# Patient Record
Sex: Female | Born: 1960 | Race: Black or African American | Hispanic: No | Marital: Single | Smoking: Current every day smoker
Health system: Southern US, Community
[De-identification: ages and names within clinical notes are randomized; demographics above are authoritative.]

## PROBLEM LIST (undated history)

## (undated) DIAGNOSIS — K7460 Unspecified cirrhosis of liver: Secondary | ICD-10-CM

## (undated) DIAGNOSIS — N183 Chronic kidney disease, stage 3 unspecified: Secondary | ICD-10-CM

## (undated) DIAGNOSIS — D649 Anemia, unspecified: Secondary | ICD-10-CM

## (undated) DIAGNOSIS — G4730 Sleep apnea, unspecified: Secondary | ICD-10-CM

## (undated) DIAGNOSIS — M1990 Unspecified osteoarthritis, unspecified site: Secondary | ICD-10-CM

## (undated) DIAGNOSIS — N179 Acute kidney failure, unspecified: Secondary | ICD-10-CM

## (undated) DIAGNOSIS — N289 Disorder of kidney and ureter, unspecified: Secondary | ICD-10-CM

## (undated) DIAGNOSIS — K769 Liver disease, unspecified: Secondary | ICD-10-CM

## (undated) DIAGNOSIS — U071 COVID-19: Secondary | ICD-10-CM

## (undated) DIAGNOSIS — J45909 Unspecified asthma, uncomplicated: Secondary | ICD-10-CM

## (undated) DIAGNOSIS — J449 Chronic obstructive pulmonary disease, unspecified: Secondary | ICD-10-CM

## (undated) DIAGNOSIS — I2510 Atherosclerotic heart disease of native coronary artery without angina pectoris: Secondary | ICD-10-CM

## (undated) DIAGNOSIS — B182 Chronic viral hepatitis C: Secondary | ICD-10-CM

## (undated) DIAGNOSIS — I10 Essential (primary) hypertension: Secondary | ICD-10-CM

## (undated) DIAGNOSIS — J189 Pneumonia, unspecified organism: Secondary | ICD-10-CM

## (undated) DIAGNOSIS — D696 Thrombocytopenia, unspecified: Secondary | ICD-10-CM

## (undated) DIAGNOSIS — F419 Anxiety disorder, unspecified: Secondary | ICD-10-CM

## (undated) DIAGNOSIS — C801 Malignant (primary) neoplasm, unspecified: Secondary | ICD-10-CM

## (undated) DIAGNOSIS — F0390 Unspecified dementia without behavioral disturbance: Secondary | ICD-10-CM

## (undated) DIAGNOSIS — K219 Gastro-esophageal reflux disease without esophagitis: Secondary | ICD-10-CM

## (undated) DIAGNOSIS — Z5189 Encounter for other specified aftercare: Secondary | ICD-10-CM

## (undated) DIAGNOSIS — G912 (Idiopathic) normal pressure hydrocephalus: Secondary | ICD-10-CM

## (undated) DIAGNOSIS — R87629 Unspecified abnormal cytological findings in specimens from vagina: Secondary | ICD-10-CM

## (undated) DIAGNOSIS — F32A Depression, unspecified: Secondary | ICD-10-CM

## (undated) DIAGNOSIS — E119 Type 2 diabetes mellitus without complications: Secondary | ICD-10-CM

## (undated) HISTORY — DX: Anemia, unspecified: D64.9

## (undated) HISTORY — DX: Thrombocytopenia, unspecified: D69.6

## (undated) HISTORY — DX: Unspecified asthma, uncomplicated: J45.909

## (undated) HISTORY — DX: Unspecified osteoarthritis, unspecified site: M19.90

## (undated) HISTORY — DX: Anxiety disorder, unspecified: F41.9

## (undated) HISTORY — PX: PORT-A-CATH REMOVAL: SHX5289

## (undated) HISTORY — DX: Chronic kidney disease, stage 3 unspecified: N18.30

## (undated) HISTORY — DX: Depression, unspecified: F32.A

## (undated) HISTORY — PX: SKIN GRAFT: SHX250

## (undated) HISTORY — DX: Acute kidney failure, unspecified: N17.9

## (undated) HISTORY — DX: COVID-19: U07.1

## (undated) HISTORY — DX: Pneumonia, unspecified organism: J18.9

## (undated) HISTORY — DX: Encounter for other specified aftercare: Z51.89

## (undated) HISTORY — DX: Unspecified dementia, unspecified severity, without behavioral disturbance, psychotic disturbance, mood disturbance, and anxiety: F03.90

## (undated) HISTORY — DX: Unspecified abnormal cytological findings in specimens from vagina: R87.629

---
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
## 2019-04-29 ENCOUNTER — Observation Stay (HOSPITAL_COMMUNITY): Payer: Medicaid Other

## 2019-04-29 ENCOUNTER — Encounter (HOSPITAL_COMMUNITY): Payer: Self-pay

## 2019-04-29 ENCOUNTER — Other Ambulatory Visit: Payer: Self-pay

## 2019-04-29 ENCOUNTER — Inpatient Hospital Stay (HOSPITAL_COMMUNITY)
Admission: EM | Admit: 2019-04-29 | Discharge: 2019-05-02 | DRG: 433 | Disposition: A | Discharge status: Home Health | Payer: Medicaid Other | Attending: Internal Medicine | Admitting: Internal Medicine

## 2019-04-29 ENCOUNTER — Emergency Department (HOSPITAL_COMMUNITY): Payer: Medicaid Other

## 2019-04-29 DIAGNOSIS — F0390 Unspecified dementia without behavioral disturbance: Secondary | ICD-10-CM | POA: Diagnosis present

## 2019-04-29 DIAGNOSIS — N179 Acute kidney failure, unspecified: Secondary | ICD-10-CM | POA: Diagnosis not present

## 2019-04-29 DIAGNOSIS — K7040 Alcoholic hepatic failure without coma: Secondary | ICD-10-CM | POA: Diagnosis present

## 2019-04-29 DIAGNOSIS — Z681 Body mass index (BMI) 19 or less, adult: Secondary | ICD-10-CM

## 2019-04-29 DIAGNOSIS — Z720 Tobacco use: Secondary | ICD-10-CM

## 2019-04-29 DIAGNOSIS — E876 Hypokalemia: Secondary | ICD-10-CM | POA: Diagnosis present

## 2019-04-29 DIAGNOSIS — E875 Hyperkalemia: Secondary | ICD-10-CM | POA: Diagnosis present

## 2019-04-29 DIAGNOSIS — K7031 Alcoholic cirrhosis of liver with ascites: Secondary | ICD-10-CM | POA: Diagnosis not present

## 2019-04-29 DIAGNOSIS — K7682 Hepatic encephalopathy: Secondary | ICD-10-CM | POA: Diagnosis present

## 2019-04-29 DIAGNOSIS — J449 Chronic obstructive pulmonary disease, unspecified: Secondary | ICD-10-CM | POA: Diagnosis present

## 2019-04-29 DIAGNOSIS — D61818 Other pancytopenia: Secondary | ICD-10-CM | POA: Diagnosis present

## 2019-04-29 DIAGNOSIS — R4182 Altered mental status, unspecified: Secondary | ICD-10-CM | POA: Diagnosis not present

## 2019-04-29 DIAGNOSIS — E860 Dehydration: Secondary | ICD-10-CM | POA: Diagnosis present

## 2019-04-29 DIAGNOSIS — N184 Chronic kidney disease, stage 4 (severe): Secondary | ICD-10-CM | POA: Diagnosis present

## 2019-04-29 DIAGNOSIS — Z85819 Personal history of malignant neoplasm of unspecified site of lip, oral cavity, and pharynx: Secondary | ICD-10-CM

## 2019-04-29 DIAGNOSIS — K7200 Acute and subacute hepatic failure without coma: Secondary | ICD-10-CM

## 2019-04-29 DIAGNOSIS — F1021 Alcohol dependence, in remission: Secondary | ICD-10-CM | POA: Diagnosis present

## 2019-04-29 DIAGNOSIS — K7460 Unspecified cirrhosis of liver: Secondary | ICD-10-CM

## 2019-04-29 DIAGNOSIS — Z20822 Contact with and (suspected) exposure to covid-19: Secondary | ICD-10-CM | POA: Diagnosis present

## 2019-04-29 DIAGNOSIS — E46 Unspecified protein-calorie malnutrition: Secondary | ICD-10-CM | POA: Diagnosis present

## 2019-04-29 DIAGNOSIS — N183 Chronic kidney disease, stage 3 unspecified: Secondary | ICD-10-CM | POA: Diagnosis present

## 2019-04-29 DIAGNOSIS — I129 Hypertensive chronic kidney disease with stage 1 through stage 4 chronic kidney disease, or unspecified chronic kidney disease: Secondary | ICD-10-CM | POA: Diagnosis present

## 2019-04-29 DIAGNOSIS — F17210 Nicotine dependence, cigarettes, uncomplicated: Secondary | ICD-10-CM | POA: Diagnosis present

## 2019-04-29 DIAGNOSIS — Z66 Do not resuscitate: Secondary | ICD-10-CM | POA: Diagnosis present

## 2019-04-29 DIAGNOSIS — D6959 Other secondary thrombocytopenia: Secondary | ICD-10-CM | POA: Diagnosis present

## 2019-04-29 DIAGNOSIS — D696 Thrombocytopenia, unspecified: Secondary | ICD-10-CM | POA: Diagnosis present

## 2019-04-29 DIAGNOSIS — B1920 Unspecified viral hepatitis C without hepatic coma: Secondary | ICD-10-CM | POA: Diagnosis present

## 2019-04-29 DIAGNOSIS — F329 Major depressive disorder, single episode, unspecified: Secondary | ICD-10-CM | POA: Diagnosis present

## 2019-04-29 HISTORY — DX: Chronic obstructive pulmonary disease, unspecified: J44.9

## 2019-04-29 HISTORY — DX: Liver disease, unspecified: K76.9

## 2019-04-29 HISTORY — DX: Essential (primary) hypertension: I10

## 2019-04-29 HISTORY — DX: Disorder of kidney and ureter, unspecified: N28.9

## 2019-04-29 LAB — COMPREHENSIVE METABOLIC PANEL
ALT: 22 U/L (ref 0–44)
AST: 39 U/L (ref 15–41)
Albumin: 3.2 g/dL — ABNORMAL LOW (ref 3.5–5.0)
Alkaline Phosphatase: 90 U/L (ref 38–126)
Anion gap: 7 (ref 5–15)
BUN: 41 mg/dL — ABNORMAL HIGH (ref 6–20)
CO2: 20 mmol/L — ABNORMAL LOW (ref 22–32)
Calcium: 9.3 mg/dL (ref 8.9–10.3)
Chloride: 108 mmol/L (ref 98–111)
Creatinine, Ser: 2.03 mg/dL — ABNORMAL HIGH (ref 0.44–1.00)
GFR calc Af Amer: 31 mL/min — ABNORMAL LOW (ref >60)
GFR calc non Af Amer: 27 mL/min — ABNORMAL LOW (ref >60)
Glucose, Bld: 82 mg/dL (ref 70–99)
Potassium: 5.3 mmol/L — ABNORMAL HIGH (ref 3.5–5.1)
Sodium: 135 mmol/L (ref 135–145)
Total Bilirubin: 1.5 mg/dL — ABNORMAL HIGH (ref 0.3–1.2)
Total Protein: 7.8 g/dL (ref 6.5–8.1)

## 2019-04-29 LAB — RESPIRATORY PANEL BY RT PCR (FLU A&B, COVID)
Influenza A by PCR: NEGATIVE
Influenza B by PCR: NEGATIVE
SARS Coronavirus 2 by RT PCR: NEGATIVE

## 2019-04-29 LAB — CBC
HCT: 33.9 % — ABNORMAL LOW (ref 36.0–46.0)
Hemoglobin: 11.3 g/dL — ABNORMAL LOW (ref 12.0–15.0)
MCH: 34.2 pg — ABNORMAL HIGH (ref 26.0–34.0)
MCHC: 33.3 g/dL (ref 30.0–36.0)
MCV: 102.7 fL — ABNORMAL HIGH (ref 80.0–100.0)
Platelets: 67 10*3/uL — ABNORMAL LOW (ref 150–400)
RBC: 3.3 MIL/uL — ABNORMAL LOW (ref 3.87–5.11)
RDW: 16.8 % — ABNORMAL HIGH (ref 11.5–15.5)
WBC: 2.6 10*3/uL — ABNORMAL LOW (ref 4.0–10.5)
nRBC: 0 % (ref 0.0–0.2)

## 2019-04-29 LAB — CBG MONITORING, ED
Glucose-Capillary: 76 mg/dL (ref 70–99)
Glucose-Capillary: 82 mg/dL (ref 70–99)
Glucose-Capillary: 80 mg/dL (ref 70–99)

## 2019-04-29 LAB — BLOOD GAS, VENOUS
Acid-base deficit: 3.5 mmol/L — ABNORMAL HIGH (ref 0.0–2.0)
Bicarbonate: 21.2 mmol/L (ref 20.0–28.0)
O2 Saturation: 52.4 %
Patient temperature: 98.6
pCO2, Ven: 39.7 mmHg — ABNORMAL LOW (ref 44.0–60.0)
pH, Ven: 7.348 (ref 7.250–7.430)
pO2, Ven: 34.9 mmHg (ref 32.0–45.0)

## 2019-04-29 LAB — PROTIME-INR
INR: 1.1 (ref 0.8–1.2)
Prothrombin Time: 14.1 seconds (ref 11.4–15.2)

## 2019-04-29 LAB — TROPONIN I (HIGH SENSITIVITY): Troponin I (High Sensitivity): 4 ng/L (ref <18)

## 2019-04-29 LAB — MAGNESIUM: Magnesium: 2.3 mg/dL (ref 1.7–2.4)

## 2019-04-29 LAB — LACTIC ACID, PLASMA: Lactic Acid, Venous: 1.3 mmol/L (ref 0.5–1.9)

## 2019-04-29 LAB — CK: Total CK: 43 U/L (ref 38–234)

## 2019-04-29 LAB — AMMONIA: Ammonia: 44 umol/L — ABNORMAL HIGH (ref 9–35)

## 2019-04-29 MED ORDER — SODIUM CHLORIDE 0.9% FLUSH: 3.0000 mL | Freq: Once | INTRAVENOUS | Status: DC

## 2019-04-29 MED ORDER — SODIUM CHLORIDE 0.9 % IV BOLUS
500.0000 mL | Freq: Once | INTRAVENOUS | Status: AC
  Administered 2019-04-29: 19:00:00 500 mL via INTRAVENOUS

## 2019-04-29 MED ORDER — NICOTINE 21 MG/24HR TD PT24
21.0000 mg | MEDICATED_PATCH | Freq: Every day | TRANSDERMAL | Status: DC
  Filled 2019-04-29 (×2): qty 1

## 2019-04-29 NOTE — ED Notes (Signed)
Phlebotomy unable to collect any blood.

## 2019-04-29 NOTE — ED Triage Notes (Signed)
Patient's sister reports tht the patient woke at 0630 today with decreased movement to the right arm and leg. patient also has altered mental status.

## 2019-04-29 NOTE — ED Provider Notes (Signed)
[facility] Emergency Department Provider Note MRN:  [id number]  Arrival date & time: 04/29/19     Chief Complaint   decreased mobility and Altered Mental Status   History of Present Illness   Judy Meza is a 59 y.o. year-old female with a history of COPD, throat cancer, liver disease, CKD presenting to the ED with chief complaint of altered mental status.  Patient was acting normally yesterday at about 8 PM prior to going to bed.  Patient was discovered by his sister today in the bathroom acting abnormally.  Attempting to wipe herself without any toilet paper, confused.  Shuffled gait, question of weakness to the right side.  I was unable to obtain an accurate HPI, PMH, or ROS due to the patient's altered mental status.  Level 5 caveat.  Review of Systems  A complete 10 system review of systems was obtained and all systems are negative except as noted in the HPI and PMH.   Patient's Health History    Past Medical History:  Diagnosis Date  . Cancer ([place])   . COPD (chronic obstructive pulmonary disease) ([place])   . Hypertension   . Liver disease   . Renal disorder     Past Surgical History:  Procedure Laterality Date  . PORT-A-CATH REMOVAL      Family History  Problem Relation Age of Onset  . Healthy Mother   . Hypertension Other     Social History   Socioeconomic History  . Marital status: Single    Spouse name: Not on file  . Number of children: Not on file  . Years of education: Not on file  . Highest education level: Not on file  Occupational History  . Not on file  Tobacco Use  . Smoking status: Current Every Day Smoker    Packs/day: 0.25    Types: Cigarettes  . Smokeless tobacco: Never Used  Substance and Sexual Activity  . Alcohol use: Not Currently  . Drug use: Not Currently  . Sexual activity: Not on file  Other Topics Concern  . Not on file  Social History Narrative  . Not on file   Social Determinants of Health    Financial Resource Strain:   . Difficulty of Paying Living Expenses:   Food Insecurity:   . Worried About Charity fundraiser in the Last Year:   . Arboriculturist in the Last Year:   Transportation Needs:   . Film/video editor (Medical):   Marland Kitchen Lack of Transportation (Non-Medical):   Physical Activity:   . Days of Exercise per Week:   . Minutes of Exercise per Session:   Stress:   . Feeling of Stress :   Social Connections:   . Frequency of Communication with Friends and Family:   . Frequency of Social Gatherings with Friends and Family:   . Attends Religious Services:   . Active Member of Clubs or Organizations:   . Attends Archivist Meetings:   Marland Kitchen Marital Status:   Intimate Partner Violence:   . Fear of Current or Ex-Partner:   . Emotionally Abused:   Marland Kitchen Physically Abused:   . Sexually Abused:      Physical Exam   Vitals:   04/29/19 1356 04/29/19 1900  BP: 104/76 (!) 139/99  Pulse: 94 89  Resp: 16 (!) 22  Temp: 98.2 F (36.8 C)   SpO2: 99% 99%    CONSTITUTIONAL: Chronically ill-appearing, NAD NEURO: Alert, answers most questions with her  name, confused, moves all extremities equally EYES:  eyes equal and reactive ENT/NECK:  no LAD, no JVD; stoma to the anterior neck with dressing in place CARDIO: Regular rate, well-perfused, normal S1 and S2 PULM:  CTAB no wheezing or rhonchi GI/GU:  normal bowel sounds, non-distended, non-tender MSK/SPINE:  No gross deformities, no edema SKIN:  no rash, atraumatic PSYCH:  Appropriate speech and behavior  *Additional and/or pertinent findings included in MDM below  Diagnostic and Interventional Summary    EKG Interpretation  Date/Time:    Ventricular Rate:    PR Interval:    QRS Duration:   QT Interval:    QTC Calculation:   R Axis:     Text Interpretation:        Labs Reviewed  COMPREHENSIVE METABOLIC PANEL - Abnormal; Notable for the following components:      Result Value   Potassium 5.3 (*)     CO2 20 (*)    BUN 41 (*)    Creatinine, Ser 2.03 (*)    Albumin 3.2 (*)    Total Bilirubin 1.5 (*)    GFR calc non Af Amer 27 (*)    GFR calc Af Amer 31 (*)    All other components within normal limits  CBC - Abnormal; Notable for the following components:   WBC 2.6 (*)    RBC 3.30 (*)    Hemoglobin 11.3 (*)    HCT 33.9 (*)    MCV 102.7 (*)    MCH 34.2 (*)    RDW 16.8 (*)    Platelets 67 (*)    All other components within normal limits  AMMONIA - Abnormal; Notable for the following components:   Ammonia 44 (*)    All other components within normal limits  SARS CORONAVIRUS 2 (TAT 6-24 HRS)  PROTIME-INR  LACTIC ACID, PLASMA  URINALYSIS, ROUTINE W REFLEX MICROSCOPIC  CBC WITH DIFFERENTIAL/PLATELET  CK  MAGNESIUM  SODIUM, URINE, RANDOM  CREATININE, URINE, RANDOM  BLOOD GAS, VENOUS  BRAIN NATRIURETIC PEPTIDE  CBG MONITORING, ED  CBG MONITORING, ED  TROPONIN I (HIGH SENSITIVITY)    CT Head Wo Contrast  Final Result    DG Chest Port 1 View  Final Result      Medications  sodium chloride flush (NS) 0.9 % injection 3 mL (has no administration in time range)  sodium chloride 0.9 % bolus 500 mL (500 mLs Intravenous New Bag/Given 04/29/19 1925)     Procedures  /  Critical Care Ultrasound ED Peripheral IV (Provider)  Date/Time: 04/29/2019 4:54 PM Performed by: Maudie Flakes, MD Authorized by: Maudie Flakes, MD   Procedure details:    Indications: multiple failed IV attempts     Skin Prep: chlorhexidine gluconate     Location: Right basilic vein.   Angiocath:  20 G   Bedside Ultrasound Guided: Yes     Patient tolerated procedure without complications: Yes     Dressing applied: Yes    .Critical Care Performed by: Maudie Flakes, MD Authorized by: Maudie Flakes, MD   Critical care provider statement:    Critical care time (minutes):  32   Critical care was necessary to treat or prevent imminent or life-threatening deterioration of the following  conditions:  CNS failure or compromise (Encephalopathy)   Critical care was time spent personally by me on the following activities:  Discussions with consultants, evaluation of patient's response to treatment, examination of patient, ordering and performing treatments and interventions, ordering and review of  laboratory studies, ordering and review of radiographic studies, pulse oximetry, re-evaluation of patient's condition, obtaining history from patient or surrogate and review of old charts    ED Course and Medical Decision Making  I have reviewed the triage vital signs, the nursing notes, and pertinent available records from the EMR.  Listed above are laboratory and imaging tests that I personally ordered, reviewed, and interpreted and then considered in my medical decision making (see below for details).      Unclear if CNS or metabolic cause of altered mental status.  Sister is reporting this possible right-sided weakness though it is not evident on exam currently.  Patient is acutely confused, can state her name but then answers any other questions with her name.  There does not appear to be visual field cuts or neglect or aphasia, last known normal yesterday at 8 PM.  Will evaluate for metabolic causes such as hepatic encephalopathy, metabolic disarray, UTI, will also obtain CT head and monitor closely.  Work-up is largely unrevealing, mildly elevated ammonia, mild hypokalemia.  CT is without acute process.  Admitted to hospitalist service for further care.  Barth Kirks. Sedonia Small, [place] [email or web address]  Final Clinical Impressions(s) / ED Diagnoses     ICD-10-CM   1. Altered mental status, unspecified altered mental status type  R41.82     ED Discharge Orders    None       Discharge Instructions Discussed with and Provided to Patient:   Discharge Instructions   None       Maudie Flakes, MD 04/29/19 2025

## 2019-04-29 NOTE — ED Notes (Signed)
Phlebotomy consulted.

## 2019-04-29 NOTE — ED Notes (Signed)
Unable to collect blood, IV team consulted. Phlebotomy called, no answer.

## 2019-04-29 NOTE — H&P (Signed)
Judy Meza JOI:[id number] DOB: 29-Apr-1960 DOA: 04/29/2019   PCP: Patient, No Pcp Per   Outpatient Specialists:  NONE    Patient arrived to ER on 04/29/19 at 1345  Patient coming from: home Lives  With family    Chief Complaint:   Chief Complaint  Patient presents with  . decreased mobility  . Altered Mental Status    HPI: Judy Meza is a 59 y.o. female with medical history significant of COPD, throat cancer, cirrhosis, CKD, HTN, EtOH abuse in remission, tobacco abuse and hx of IVDU in remission, HEpC    Presented with   patient woke up today at 6:30 AM with decreased movement of her right arm and leg and confused, last time seen normal 8 PM.  She was discovered this morning by her sister in the bathroom.  Patient appeared very confused and family had to feed her.  Patient was having shuffling gait. Pt is originally from [place] and have moved 1 wks ago, prior to that pt was homeless until July when her family took er in ad started to take care of her. Patient continues to smoke Does not drink alcohol She supposed to be on lactulose Family have been giving her lactulose as needed bc she had too many BM's Per family patient recently was admitted dementia, for hepatic encephalopathy after that she was moved by her family to [place] they have not established any care here.  She used to have a PEG tube and tracheostomy but recently has been doing decannulated She is currently able to take her medication by mouth  Infectious risk factors:  Reports confusion   Has  been vaccinated against COVID x1 Feb 17th   in house  PCR testing  Pending  No results found for: SARSCOV2NAA  Regarding pertinent Chronic problems:   Hx of throat cancer in May 2020- sp radiation therapy only sp trach and lately been decanulated    HTN on lisinopril 10 qd       COPD - not  followed by pulmonology  Not on baseline oxygen          CKD stage III - baseline Cr unKnown Lab Results    Component Value Date   CREATININE 2.03 (H) 04/29/2019    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 ED FAST [place] BEDSIDE  . SARS CORONAVIRUS 2 (TAT 6-24 HRS) Nasopharyngeal Nasopharyngeal Swab  . CT Head Wo Contrast  . DG Chest [facility]  . Comprehensive metabolic panel  . CBC  . Ammonia  . Protime-INR  . Lactic acid, plasma  . Urinalysis, Routine w reflex microscopic  . Diet NPO time specified  . Cardiac monitoring  . Saline Lock IV, Maintain IV access  . Neuro checks q 2 hours x12 hours  . Consult to hospitalist  ALL PATIENTS BEING ADMITTED/HAVING PROCEDURES NEED COVID-19  SCREENING  . Pulse oximetry, continuous  . CBG monitoring, ED  . I-Stat beta hCG blood, ED ([facility], [facility], [facility] only)    Following Medications were ordered in ER: Medications  sodium chloride flush (NS) 0.9 % injection 3 mL (has no administration in time range)  sodium chloride 0.9 % bolus 500 mL (has no administration in time range)        Consult Orders  (From admission, onward)         Start     Ordered   04/29/19 1837  Consult to hospitalist  ALL PATIENTS BEING ADMITTED/HAVING PROCEDURES NEED COVID-19 SCREENING  Once    Comments: ALL PATIENTS BEING ADMITTED/HAVING PROCEDURES NEED COVID-19 SCREENING  Provider:  (Not yet assigned)  Question Answer Comment  Place call to: [facility]   Reason for Consult Admit       04/29/19 1837         Significant initial  Findings: Abnormal Labs Reviewed  COMPREHENSIVE METABOLIC PANEL - Abnormal; Notable for the following components:      Result Value   Potassium 5.3 (*)    CO2 20 (*)    BUN 41 (*)    Creatinine, Ser 2.03 (*)    Albumin 3.2 (*)    Total Bilirubin 1.5 (*)    GFR calc non Af Amer 27 (*)    GFR calc Af Amer 31 (*)    All other components within normal limits  CBC - Abnormal; Notable for the following components:   WBC 2.6 (*)    RBC 3.30 (*)    Hemoglobin 11.3 (*)    HCT 33.9 (*)    MCV 102.7 (*)    MCH 34.2 (*)    RDW 16.8 (*)    Platelets 67 (*)    All other components within normal limits  AMMONIA - Abnormal; Notable for the following components:   Ammonia 44 (*)    All other components within normal limits    Otherwise labs showing:    Recent Labs  Lab 04/29/19 1409  NA 135  K 5.3*  CO2 20*  GLUCOSE 82  BUN 41*  CREATININE 2.03*  CALCIUM 9.3    Cr   Lab Results  Component Value Date   CREATININE 2.03 (H) 04/29/2019    Recent Labs  Lab 04/29/19 1409  AST 39  ALT 22  ALKPHOS 90  BILITOT 1.5*  PROT 7.8  ALBUMIN 3.2*   Lab Results  Component Value Date   CALCIUM 9.3 04/29/2019     WBC      Component Value Date/Time   WBC 2.6 (L) 04/29/2019 1409   ANC No results found for: NEUTROABS ALC No components found for: LYMPHAB    Plt: Lab Results  Component Value Date   PLT 67 (L) 04/29/2019    Lactic Acid, Venous    Component Value Date/Time   LATICACIDVEN 1.3 04/29/2019 1426     COVID-19 Labs  No results for input(s): DDIMER, FERRITIN, LDH, CRP in the last 72 hours.  No results found for: SARSCOV2NAA    Venous  Blood Gas result:  pH 7.348 pCO2 39.7;    HG/HCT stable     Component Value Date/Time   HGB 11.3 (L) 04/29/2019 1409   HCT 33.9 (L) 04/29/2019 1409    No results for input(s): LIPASE, AMYLASE in the last 168 hours. Recent Labs  Lab 04/29/19 1424  AMMONIA 44*    No  components found for: LABALBU   Troponin  4  Cardiac Panel (last 3 results) Recent Labs    04/29/19 2008  CKTOTAL 43       ECG: Ordered     CBG (last 3)  Recent Labs    04/29/19 1422 04/29/19 2018  GLUCAP 76 82     UA  ordered      Ordered  CT HEAD  NON acute  CXR  NON acute       ED Triage Vitals  Enc Vitals Group     BP 04/29/19 1356 104/76     Pulse Rate 04/29/19 1356 94     Resp 04/29/19 1356 16     Temp 04/29/19 1356 98.2 F (36.8 C)     Temp Source 04/29/19 1356 Oral     SpO2 04/29/19 1356 99 %     Weight 04/29/19 1403 105 lb (47.6 kg)     Height 04/29/19 1403 5\' 2"  (1.575 m)     Head Circumference --      Peak Flow --      Pain Score 04/29/19 1403 0     Pain Loc --      Pain Edu? --      Excl. in [place]? --   TMAX(24)@       Latest  Blood pressure 104/76, pulse 94, temperature 98.2 F (36.8 C), temperature source Oral, resp. rate 16, height 5\' 2"  (1.575 m), weight 47.6 kg, SpO2 99 %.     Review of Systems:    Pertinent positives include: fatigue  confusion  Constitutional:  No weight loss, night sweats, Fevers, chills, , weight loss  HEENT:  No headaches, Difficulty swallowing,Tooth/dental problems,Sore throat,  No sneezing, itching, ear ache, nasal congestion, post nasal drip,  Cardio-vascular:  No chest pain, Orthopnea, PND, anasarca, dizziness, palpitations.no Bilateral lower extremity swelling  GI:  No heartburn, indigestion, abdominal pain, nausea, vomiting, diarrhea, change in bowel habits, loss of appetite, melena, blood in stool, hematemesis Resp:  no shortness of breath at rest. No dyspnea on exertion, No excess mucus, no productive cough, No non-productive cough, No coughing up of blood.No change in color of mucus.No wheezing. Skin:  no rash or lesions. No jaundice GU:  no dysuria, change in color of urine, no urgency or frequency. No straining to urinate.  No flank pain.  Musculoskeletal:  No joint pain or no joint swelling. No  decreased range of motion. No back pain.  Psych:  No change in mood or affect. No depression or anxiety. No memory loss.  Neuro: no localizing neurological complaints, no tingling, no weakness, no double vision, no gait abnormality, no slurred speech, no  All systems reviewed and apart from [place] all are negative  Past Medical History:   Past Medical History:  Diagnosis Date  . Cancer ([place])   . COPD (chronic obstructive pulmonary disease) ([place])   . Hypertension   . Liver disease   . Renal disorder      Past Surgical History:  Procedure Laterality Date  . PORT-A-CATH REMOVAL      Social History:  Ambulatory   independently       reports that she has been smoking cigarettes. She has been smoking about 0.25 packs per day. She has never used smokeless tobacco. She reports previous alcohol use. She reports previous drug use.     Family History:   Family History  Problem Relation Age of Onset  . Healthy Mother     Allergies: No Known Allergies   Prior to Admission medications   Not  on File   Physical Exam: Blood pressure 104/76, pulse 94, temperature 98.2 F (36.8 C), temperature source Oral, resp. rate 16, height 5\' 2"  (1.575 m), weight 47.6 kg, SpO2 99 %. 1. General:  in No  Acute distress   Chronically ill -appearing 2. Psychological: Alert and   Oriented 3. Head/ENT:   Dry Mucous Membranes                          Head Non traumatic, neck supple                         Poor Dentition 4. SKIN:   decreased Skin turgor,  Skin clean Dry and intact no rash 5. Heart: Regular rate and rhythm no  Murmur, no Rub or gallop 6. Lungs: no wheezes or crackles   7. Abdomen: Soft,  non-tender, mildly distended   bowel sounds present 8. Lower extremities: no clubbing, cyanosis, no edema 9. Neurologically   strength 5 out of 5 in all 4 extremities cranial nerves II through XII intact mild asterexis 10. MSK: Normal range of motion   All other LABS:     Recent Labs  Lab  04/29/19 1409  WBC 2.6*  HGB 11.3*  HCT 33.9*  MCV 102.7*  PLT 67*     Recent Labs  Lab 04/29/19 1409  NA 135  K 5.3*  CL 108  CO2 20*  GLUCOSE 82  BUN 41*  CREATININE 2.03*  CALCIUM 9.3     Recent Labs  Lab 04/29/19 1409  AST 39  ALT 22  ALKPHOS 90  BILITOT 1.5*  PROT 7.8  ALBUMIN 3.2*       Cultures: No results found for: SDES, SPECREQUEST, CULT, REPTSTATUS   Radiological Exams on Admission: CT Head Wo Contrast  Result Date: 04/29/2019 CLINICAL DATA:  Altered mental status. Decreased movement of RIGHT arm and leg. EXAM: CT HEAD WITHOUT CONTRAST TECHNIQUE: Contiguous axial images were obtained from the base of the skull through the vertex without intravenous contrast. COMPARISON:  None. FINDINGS: Brain: Ventricles are normal in size. Patchy chronic small vessel ischemic changes are seen within the bilateral periventricular and subcortical white matter. There is a small old lacunar infarct within the LEFT basal ganglia. No mass, hemorrhage or other evidence of acute parenchymal abnormality. No extra-axial hemorrhage. Vascular: Chronic calcified atherosclerotic changes of the large vessels at the skull base. No unexpected hyperdense vessel. Skull: Normal. Negative for fracture or focal lesion. Sinuses/Orbits: No acute finding. Other: None. IMPRESSION: 1. No acute findings.  No intracranial mass, hemorrhage or edema. 2. Chronic small vessel ischemic changes within the white matter and basal ganglia. Electronically Signed   By: Franki Cabot M.D.   On: 04/29/2019 15:05   DG Chest Port 1 View  Result Date: 04/29/2019 CLINICAL DATA:  Altered mental status with decreased movement in the right arm and leg. EXAM: PORTABLE CHEST 1 VIEW COMPARISON:  None. FINDINGS: The heart size and mediastinal contours are within normal limits. Both lungs are clear. The visualized skeletal structures are unremarkable. IMPRESSION: Normal examination. Electronically Signed   By: Claudie Revering M.D.    On: 04/29/2019 14:51    Chart has been reviewed    Assessment/Plan  59 y.o. female with medical history significant of COPD, throat cancer, cirrhosis, CKD, HTN, EtOH abuse in remission, tobacco abuse and hx of IVDU in remission, HEpC  Admitted for hepatic encephlopathy  Present on Admission: .  Acute hepatic encephalopathy -restart lactulose patient's family  has decreased it recently.  Continue Xeloda check ammonia level in the morning continue to monitor. At this point no evidence of right-sided weakness CT head unremarkable ammonia level slightly elevated Would benefit from further CNS imaging given history of cancer.  Would benefit from contrasted MRI when renal function improves. For tonight hold sedating medications VBG showing no evidence of hypercarbia   . CKD (chronic kidney disease), stage III  -patient family showed me records that at baseline her creatinine is close to 1.2 likely currently AKI.  We will gently rehydrate avoid nephrotoxic medications  . COPD with chronic bronchitis (HCC) -chronic stable at baseline not on oxygen continue home medications  . Tobacco abuse -   Spoke about importance of quitting spent 5 minutes discussing options for treatment, prior attempts at quitting, and dangers of smoking  -At this point patient is    interested in quitting  - order nicotine patch   - nursing tobacco cessation protocol  . AKI (acute kidney injury) ([place]) most likely secondary to diminished p.o. intake we will gently rehydrate hold spironolactone Lasix and lisinopril for tonight if evidence of significant hypoalbuminemia once fluid resuscitated may need to replace.  Obtain ultrasound to evaluate for any evidence of underlying kidney disease  . Alcoholic cirrhosis of liver with ascites (HCC) -ascites mild.  Patient has not required paracentesis for the past 6 weeks.  Patient will need to have follow-up with GI established. MELD-Na score: 18 at 04/29/2019    .  Thrombocytopenia ([place]) most likely secondary to cirrhosis  . Hyperkalemia mild will monitor on telemetry hold ACE inhibitor  History of hepatitis C -untreated will need to follow-up with GI and establish care Obtain hep C viral load and check HIV status  History of throat cancer patient will need to establish care with hematology locally  History of alcohol abuse currently in remission   Dehydration we will gently rehydrate and follow   Other plan as per orders.  DVT prophylaxis:  SCD     Code Status:    DNR/DNI as per patient   I had personally discussed CODE STATUS with   Family     Family Communication:   Family at  Bedside  plan of care was discussed on the phone with  Sister   Disposition Plan:     To home once workup is complete and patient is stable   Following barriers for discharge:                            Electrolytes corrected                                                           Will need to be able to tolerate PO                            Will likely need home health                                                  Would benefit  from PT/OT eval prior to DC  Ordered                   Swallow eval - SLP ordered                      Transition of care consulted                   Nutrition    consulted                                                        Consults called:   none no indication for emergent consult at this time by the time of discharge will need assistance with setting up care with GI and hematology oncology  Admission status:  ED Disposition    None      Obs    Level of care    tele  For   24H     Precautions: admitted as PUI  No active isolations  If Covid PCR is negative  - please DC precautions     PPE: Used by the provider:   P100  eye Goggles,  Gloves  gown      Aloise Copus 04/29/2019, 10:27 PM    [facility]     after 2 AM please page floor coverage PA If 7AM-7PM, please contact the day team  taking care of the patient using [email or web address]   Patient was evaluated in the context of the global COVID-19 pandemic, which necessitated consideration that the patient might be at risk for infection with the SARS-CoV-2 virus that causes COVID-19. Institutional protocols and algorithms that pertain to the evaluation of patients at risk for COVID-19 are in a state of rapid change based on information released by regulatory bodies including the CDC and federal and state organizations. These policies and algorithms were followed during the patient's care.

## 2019-04-30 DIAGNOSIS — K7031 Alcoholic cirrhosis of liver with ascites: Secondary | ICD-10-CM | POA: Diagnosis not present

## 2019-04-30 DIAGNOSIS — K7030 Alcoholic cirrhosis of liver without ascites: Secondary | ICD-10-CM

## 2019-04-30 DIAGNOSIS — K7200 Acute and subacute hepatic failure without coma: Secondary | ICD-10-CM | POA: Diagnosis not present

## 2019-04-30 DIAGNOSIS — N179 Acute kidney failure, unspecified: Secondary | ICD-10-CM | POA: Diagnosis not present

## 2019-04-30 DIAGNOSIS — K7460 Unspecified cirrhosis of liver: Secondary | ICD-10-CM

## 2019-04-30 LAB — PREALBUMIN: Prealbumin: 12.1 mg/dL — ABNORMAL LOW (ref 18–38)

## 2019-04-30 LAB — COMPREHENSIVE METABOLIC PANEL
ALT: 24 U/L (ref 0–44)
AST: 40 U/L (ref 15–41)
Albumin: 3.3 g/dL — ABNORMAL LOW (ref 3.5–5.0)
Alkaline Phosphatase: 100 U/L (ref 38–126)
Anion gap: 8 (ref 5–15)
BUN: 35 mg/dL — ABNORMAL HIGH (ref 6–20)
CO2: 19 mmol/L — ABNORMAL LOW (ref 22–32)
Calcium: 9.7 mg/dL (ref 8.9–10.3)
Chloride: 109 mmol/L (ref 98–111)
Creatinine, Ser: 1.67 mg/dL — ABNORMAL HIGH (ref 0.44–1.00)
GFR calc Af Amer: 39 mL/min — ABNORMAL LOW (ref >60)
GFR calc non Af Amer: 34 mL/min — ABNORMAL LOW (ref >60)
Glucose, Bld: 84 mg/dL (ref 70–99)
Potassium: 5.1 mmol/L (ref 3.5–5.1)
Sodium: 136 mmol/L (ref 135–145)
Total Bilirubin: 1.7 mg/dL — ABNORMAL HIGH (ref 0.3–1.2)
Total Protein: 8.1 g/dL (ref 6.5–8.1)

## 2019-04-30 LAB — CBC WITH DIFFERENTIAL/PLATELET
Abs Immature Granulocytes: 0 10*3/uL (ref 0.00–0.07)
Basophils Absolute: 0 10*3/uL (ref 0.0–0.1)
Basophils Relative: 0 %
Eosinophils Absolute: 0 10*3/uL (ref 0.0–0.5)
Eosinophils Relative: 1 %
HCT: 37.6 % (ref 36.0–46.0)
Hemoglobin: 12.2 g/dL (ref 12.0–15.0)
Immature Granulocytes: 0 %
Lymphocytes Relative: 33 %
Lymphs Abs: 0.8 10*3/uL (ref 0.7–4.0)
MCH: 33.2 pg (ref 26.0–34.0)
MCHC: 32.4 g/dL (ref 30.0–36.0)
MCV: 102.5 fL — ABNORMAL HIGH (ref 80.0–100.0)
Monocytes Absolute: 0.3 10*3/uL (ref 0.1–1.0)
Monocytes Relative: 11 %
Neutro Abs: 1.3 10*3/uL — ABNORMAL LOW (ref 1.7–7.7)
Neutrophils Relative %: 55 %
Platelets: 65 10*3/uL — ABNORMAL LOW (ref 150–400)
RBC: 3.67 MIL/uL — ABNORMAL LOW (ref 3.87–5.11)
RDW: 16.7 % — ABNORMAL HIGH (ref 11.5–15.5)
WBC: 2.4 10*3/uL — ABNORMAL LOW (ref 4.0–10.5)
nRBC: 0 % (ref 0.0–0.2)

## 2019-04-30 LAB — GLUCOSE, CAPILLARY
Glucose-Capillary: 108 mg/dL — ABNORMAL HIGH (ref 70–99)
Glucose-Capillary: 70 mg/dL (ref 70–99)
Glucose-Capillary: 140 mg/dL — ABNORMAL HIGH (ref 70–99)
Glucose-Capillary: 115 mg/dL — ABNORMAL HIGH (ref 70–99)
Glucose-Capillary: 115 mg/dL — ABNORMAL HIGH (ref 70–99)

## 2019-04-30 LAB — PHOSPHORUS: Phosphorus: 4 mg/dL (ref 2.5–4.6)

## 2019-04-30 LAB — MAGNESIUM: Magnesium: 2.1 mg/dL (ref 1.7–2.4)

## 2019-04-30 LAB — HIV ANTIBODY (ROUTINE TESTING W REFLEX): HIV Screen 4th Generation wRfx: NONREACTIVE

## 2019-04-30 LAB — TSH: TSH: 1.259 u[IU]/mL (ref 0.350–4.500)

## 2019-04-30 LAB — HEPATITIS PANEL, ACUTE
HCV Ab: REACTIVE — AB
Hep A IgM: NONREACTIVE
Hep B C IgM: NONREACTIVE
Hepatitis B Surface Ag: NONREACTIVE

## 2019-04-30 LAB — AMMONIA: Ammonia: 63 umol/L — ABNORMAL HIGH (ref 9–35)

## 2019-04-30 LAB — TROPONIN I (HIGH SENSITIVITY): Troponin I (High Sensitivity): 4 ng/L (ref <18)

## 2019-04-30 LAB — BRAIN NATRIURETIC PEPTIDE: B Natriuretic Peptide: 21.4 pg/mL (ref 0.0–100.0)

## 2019-04-30 MED ORDER — ONDANSETRON HCL 4 MG/2ML IJ SOLN: 4.0000 mg | Freq: Four times a day (QID) | INTRAMUSCULAR | Status: DC | PRN

## 2019-04-30 MED ORDER — MAGIC MOUTHWASH
5.0000 mL | Freq: Three times a day (TID) | ORAL | Status: DC | PRN
  Administered 2019-04-30 – 2019-05-02 (×5): 5 mL via ORAL
  Filled 2019-04-30 (×8): qty 5

## 2019-04-30 MED ORDER — PANTOPRAZOLE SODIUM 40 MG PO TBEC
40.0000 mg | DELAYED_RELEASE_TABLET | Freq: Every day | ORAL | Status: DC
  Administered 2019-04-30 – 2019-05-02 (×3): 40 mg via ORAL
  Filled 2019-04-30 (×3): qty 1

## 2019-04-30 MED ORDER — ENSURE ENLIVE PO LIQD
237.0000 mL | Freq: Two times a day (BID) | ORAL | Status: DC
  Administered 2019-04-30 – 2019-05-02 (×5): 237 mL via ORAL

## 2019-04-30 MED ORDER — ALBUTEROL SULFATE (2.5 MG/3ML) 0.083% IN NEBU: 3.0000 mL | INHALATION_SOLUTION | Freq: Four times a day (QID) | RESPIRATORY_TRACT | Status: DC | PRN

## 2019-04-30 MED ORDER — LORATADINE 10 MG PO TABS
10.0000 mg | ORAL_TABLET | Freq: Every day | ORAL | Status: DC
  Administered 2019-04-30 – 2019-05-02 (×3): 10 mg via ORAL
  Filled 2019-04-30 (×3): qty 1

## 2019-04-30 MED ORDER — SODIUM CHLORIDE 0.9 % IV SOLN: INTRAVENOUS | Status: AC

## 2019-04-30 MED ORDER — LIP MEDEX EX OINT
TOPICAL_OINTMENT | CUTANEOUS | Status: AC
  Filled 2019-04-30: qty 7

## 2019-04-30 MED ORDER — SODIUM CHLORIDE 0.9% FLUSH
3.0000 mL | Freq: Two times a day (BID) | INTRAVENOUS | Status: DC
  Administered 2019-04-30 – 2019-05-02 (×3): 3 mL via INTRAVENOUS

## 2019-04-30 MED ORDER — LACTULOSE 10 GM/15ML PO SOLN
10.0000 g | Freq: Three times a day (TID) | ORAL | Status: DC
  Administered 2019-04-30 – 2019-05-02 (×7): 10 g via ORAL
  Filled 2019-04-30 (×7): qty 15

## 2019-04-30 MED ORDER — ZOLPIDEM TARTRATE 5 MG PO TABS
2.5000 mg | ORAL_TABLET | Freq: Once | ORAL | Status: AC
  Administered 2019-04-30: 2.5 mg via ORAL
  Filled 2019-04-30: qty 1

## 2019-04-30 MED ORDER — PAROXETINE HCL 20 MG PO TABS
40.0000 mg | ORAL_TABLET | Freq: Every day | ORAL | Status: DC
  Administered 2019-04-30 – 2019-05-02 (×3): 40 mg via ORAL
  Filled 2019-04-30 (×3): qty 2

## 2019-04-30 MED ORDER — RIFAXIMIN 550 MG PO TABS
550.0000 mg | ORAL_TABLET | Freq: Two times a day (BID) | ORAL | Status: DC
  Administered 2019-04-30 – 2019-05-02 (×6): 550 mg via ORAL
  Filled 2019-04-30 (×6): qty 1

## 2019-04-30 MED ORDER — ONDANSETRON HCL 4 MG PO TABS: 4.0000 mg | ORAL_TABLET | Freq: Four times a day (QID) | ORAL | Status: DC | PRN

## 2019-04-30 NOTE — Progress Notes (Signed)
PROGRESS NOTE    Judy Meza  JQB:[id number] DOB: Feb 08, 1960 DOA: 04/29/2019 PCP: Patient, No Pcp Per    Chief Complaint  Patient presents with  . decreased mobility  . Altered Mental Status    Brief Narrative: (Start on day 1 of progress note - keep it brief and live) Patient is 59 year old female, history of COPD, throat cancer, cirrhosis, chronic kidney disease, hypertension, alcohol abuse in remission, tobacco abuse and history of IV drug use in remission presented with decreased movement in the right upper and lower extremity, worsening confusion.  Patient noted to have had lactulose held secondary to too many bowel movements.  Patient noted to have recently been admitted for dementia and hepatic encephalopathy and after that moved with her family to [place] with no established care yet.  Patient noted to have had a PEG tube as well as tracheostomy in the past however tracheostomy has been decannulated.  Patient admitted for acute hepatic encephalopathy placed on lactulose.  Patient also noted to be in acute renal failure and placed on IV fluids.   Assessment & Plan:   Active Problems:   CKD (chronic kidney disease), stage III   COPD with chronic bronchitis (HCC)   Tobacco abuse   Alcoholism in remission ([place])   Acute hepatic encephalopathy   AKI (acute kidney injury) ([place])   Alcoholic cirrhosis of liver with ascites (HCC)   Thrombocytopenia (HCC)   Hyperkalemia  1 acute hepatic encephalopathy Likely secondary to lactulose not being given due to increased bowel movements as noted.  Admitting MD's note.  Ammonia level noted to be elevated currently at 63.  CT head done negative for any acute abnormalities.  Patient with clinical improvement.  Continue Xifaxan and lactulose.  Follow.  2.  Acute renal failure on chronic kidney disease stage III Baseline creatinine noted to be approximately 1.2.  Urinalysis ordered and pending.  Patient placed on IV fluids with improvement  with renal function.  Creatinine currently at 1.67 from 2.03 on admission.  Diuretics and ACE inhibitor on hold.  Continue to hold Aldactone.  Gently hydrate for another 24 hours.  Follow.  3.  COPD Stable.  4.  Tobacco abuse Tobacco cessation.  Nicotine patch.  5.  Alcoholic cirrhosis of the liver with ascites Patient not required paracenteses in over 6 weeks.  Will need outpatient follow-up with GI to establish care.  Follow.  6.  Thrombocytopenia Secondary to cirrhosis.  No bleeding notified.  Follow.  7.  Hyperkalemia Improved.  Potassium at 5.1.  ACE inhibitor discontinued.  Follow.  8.  Tobacco abuse Tobacco cessation.  Nicotine patch.  9.  Alcoholic cirrhosis of the liver with ascites Mild.  Patient has not required paracentesis in about 6 weeks.  Continue lactulose, Xifaxan.  Diuretics on hold due to acute kidney on chronic kidney disease.  Once renal function improves could likely resume diuretics slowly.  Will need to establish care with GI in the outpatient setting.  10.  Thrombocytopenia Secondary to cirrhosis.  No signs of bleeding.  Stable.  11.  Protein calorie malnutrition  12.  History of hepatitis C And treated.  Will need to follow-up with GI to establish care.  Hepatitis C viral load pending.  HIV nonreactive.  13.  History of throat cancer Will need to establish care with hematology in the outpatient setting.   DVT prophylaxis: SCDs Code Status: DNR Family Communication: Updated patient.  No family at bedside. Disposition:   Status is: Observation    Dispo:  The patient is from: Home              Anticipated d/c is to: Likely back home.              Anticipated d/c date is: In the next 1 to 2 days.              Patient currently admitted with acute hepatic encephalopathy with clinical improvement.       Consultants:   None  Procedures:  CT head 04/29/2019  Chest x-ray 04/29/2019  Abdominal ultrasound 04/30/2019  Antimicrobials:    None   Subjective: Patient lying in bed.  States she is feeling better.  Denies any chest pain.  No shortness of breath.  Alert oriented to self place and time.  Tolerating current diet.  Ate < 50% of her lunch.  Objective: Vitals:   04/30/19 0021 04/30/19 0519 04/30/19 0824 04/30/19 1100  BP: 115/81 127/90 105/72 100/79  Pulse: 72 80 91 90  Resp:   16   Temp: 97.7 F (36.5 C) 97.7 F (36.5 C) 97.6 F (36.4 C) 98 F (36.7 C)  TempSrc: Oral Oral Oral Oral  SpO2: 100% 100% 98% 97%  Weight:      Height:        Intake/Output Summary (Last 24 hours) at 04/30/2019 1249 Last data filed at 04/30/2019 0400 Gross per 24 hour  Intake 643.33 ml  Output --  Net 643.33 ml   Filed Weights   04/29/19 1403  Weight: 47.6 kg    Examination:  General exam: Appears calm and comfortable.  Bandage over trach site. Respiratory system: Clear to auscultation. Respiratory effort normal. Cardiovascular system: S1 & S2 heard, RRR. No JVD, murmurs, rubs, gallops or clicks. No pedal edema. Gastrointestinal system: Abdomen is nondistended, soft and nontender. No organomegaly or masses felt. Normal bowel sounds heard. Central nervous system: Alert and oriented. No focal neurological deficits. Extremities: Symmetric 5 x 5 power. Skin: No rashes, lesions or ulcers Psychiatry: Judgement and insight appear normal. Mood & affect appropriate.     Data Reviewed: I have personally reviewed following labs and imaging studies  CBC: Recent Labs  Lab 04/29/19 1409 04/30/19 0553  WBC 2.6* 2.4*  NEUTROABS  --  1.3*  HGB 11.3* 12.2  HCT 33.9* 37.6  MCV 102.7* 102.5*  PLT 67* 65*    Basic Metabolic Panel: Recent Labs  Lab 04/29/19 1409 04/29/19 2008 04/30/19 0553  NA 135  --  136  K 5.3*  --  5.1  CL 108  --  109  CO2 20*  --  19*  GLUCOSE 82  --  84  BUN 41*  --  35*  CREATININE 2.03*  --  1.67*  CALCIUM 9.3  --  9.7  MG  --  2.3 2.1  PHOS  --   --  4.0    GFR: Estimated  Creatinine Clearance: 28.3 mL/min (A) (by C-G formula based on SCr of 1.67 mg/dL (H)).  Liver Function Tests: Recent Labs  Lab 04/29/19 1409 04/30/19 0553  AST 39 40  ALT 22 24  ALKPHOS 90 100  BILITOT 1.5* 1.7*  PROT 7.8 8.1  ALBUMIN 3.2* 3.3*    CBG: Recent Labs  Lab 04/29/19 2203 04/30/19 0152 04/30/19 0605 04/30/19 0833 04/30/19 1233  GLUCAP 80 108* 70 140* 115*     Recent Results (from the past 240 hour(s))  Respiratory Panel by RT PCR (Flu A&B, Covid) - Nasopharyngeal Swab     Status:  None   Collection Time: 04/29/19  9:31 PM   Specimen: Nasopharyngeal Swab  Result Value Ref Range Status   SARS Coronavirus 2 by RT PCR NEGATIVE NEGATIVE Final    Comment: (NOTE) SARS-CoV-2 target nucleic acids are NOT DETECTED. The SARS-CoV-2 RNA is generally detectable in upper respiratoy specimens during the acute phase of infection. The lowest concentration of SARS-CoV-2 viral copies this assay can detect is 131 copies/mL. A negative result does not preclude SARS-Cov-2 infection and should not be used as the sole basis for treatment or other patient management decisions. A negative result may occur with  improper specimen collection/handling, submission of specimen other than nasopharyngeal swab, presence of viral mutation(s) within the areas targeted by this assay, and inadequate number of viral copies (<131 copies/mL). A negative result must be combined with clinical observations, patient history, and epidemiological information. The expected result is Negative. Fact Sheet for Patients:  PinkCheek.be Fact Sheet for Healthcare Providers:  GravelBags.it This test is not yet ap proved or cleared by the [place] FDA and  has been authorized for detection and/or diagnosis of SARS-CoV-2 by FDA under an Emergency Use Authorization (EUA). This EUA will remain  in effect (meaning this test can be used) for the duration  of the COVID-19 declaration under Section 564(b)(1) of the Act, 21 U.S.C. section 360bbb-3(b)(1), unless the authorization is terminated or revoked sooner.    Influenza A by PCR NEGATIVE NEGATIVE Final   Influenza B by PCR NEGATIVE NEGATIVE Final    Comment: (NOTE) The Xpert Xpress SARS-CoV-2/FLU/RSV assay is intended as an aid in  the diagnosis of influenza from Nasopharyngeal swab specimens and  should not be used as a sole basis for treatment. Nasal washings and  aspirates are unacceptable for Xpert Xpress SARS-CoV-2/FLU/RSV  testing. Fact Sheet for Patients: PinkCheek.be Fact Sheet for Healthcare Providers: GravelBags.it This test is not yet approved or cleared by the [place] FDA and  has been authorized for detection and/or diagnosis of SARS-CoV-2 by  FDA under an Emergency Use Authorization (EUA). This EUA will remain  in effect (meaning this test can be used) for the duration of the  Covid-19 declaration under Section 564(b)(1) of the Act, 21  U.S.C. section 360bbb-3(b)(1), unless the authorization is  terminated or revoked. Performed at [facility], [place] [address] [place], [place] [address]          Radiology Studies: CT Head Wo Contrast  Result Date: 04/29/2019 CLINICAL DATA:  Altered mental status. Decreased movement of RIGHT arm and leg. EXAM: CT HEAD WITHOUT CONTRAST TECHNIQUE: Contiguous axial images were obtained from the base of the skull through the vertex without intravenous contrast. COMPARISON:  None. FINDINGS: Brain: Ventricles are normal in size. Patchy chronic small vessel ischemic changes are seen within the bilateral periventricular and subcortical white matter. There is a small old lacunar infarct within the LEFT basal ganglia. No mass, hemorrhage or other evidence of acute parenchymal abnormality. No extra-axial hemorrhage. Vascular: Chronic calcified atherosclerotic  changes of the large vessels at the skull base. No unexpected hyperdense vessel. Skull: Normal. Negative for fracture or focal lesion. Sinuses/Orbits: No acute finding. Other: None. IMPRESSION: 1. No acute findings.  No intracranial mass, hemorrhage or edema. 2. Chronic small vessel ischemic changes within the white matter and basal ganglia. Electronically Signed   By: Franki Cabot M.D.   On: 04/29/2019 15:05   US Abdomen Complete  Result Date: 04/30/2019 CLINICAL DATA:  Cirrhosis, acute renal insufficiency, hepatic encephalopathy EXAM: ABDOMEN  ULTRASOUND COMPLETE COMPARISON:  None. FINDINGS: Gallbladder: Multiple shadowing gallstones without cholecystitis. Largest calculus measures approximately 6 mm. No gallbladder wall thickening or pericholecystic fluid. Common bile duct: Diameter: 7 mm Liver: Heterogeneous echotexture with nodularity of the liver capsule compatible with cirrhosis. No focal abnormality. Portal vein is patent on color Doppler imaging with normal direction of blood flow towards the liver. IVC: No abnormality visualized. Pancreas: Visualized portion unremarkable. Spleen: Size and appearance within normal limits. Right Kidney: Length: 7.6 cm. Echogenicity within normal limits. No mass or hydronephrosis visualized. Left Kidney: Length: 10.0 cm. Echogenicity within normal limits. No mass or hydronephrosis visualized. Abdominal aorta: Atheromatous plaque throughout the aorta without evidence of aneurysm. Other findings: None. IMPRESSION: 1. Heterogeneous liver echotexture with nodular capsule compatible with cirrhosis. 2. Cholelithiasis without cholecystitis. Electronically Signed   By: Randa Ngo M.D.   On: 04/30/2019 00:36   DG Chest Port 1 View  Result Date: 04/29/2019 CLINICAL DATA:  Altered mental status with decreased movement in the right arm and leg. EXAM: PORTABLE CHEST 1 VIEW COMPARISON:  None. FINDINGS: The heart size and mediastinal contours are within normal limits. Both lungs  are clear. The visualized skeletal structures are unremarkable. IMPRESSION: Normal examination. Electronically Signed   By: Claudie Revering M.D.   On: 04/29/2019 14:51        Scheduled Meds: . feeding supplement (ENSURE ENLIVE)  237 mL Oral BID BM  . lactulose  10 g Oral TID  . loratadine  10 mg Oral Daily  . nicotine  21 mg Transdermal Daily  . pantoprazole  40 mg Oral Daily  . PARoxetine  40 mg Oral Daily  . rifaximin  550 mg Oral BID  . sodium chloride flush  3 mL Intravenous Once  . sodium chloride flush  3 mL Intravenous Q12H   Continuous Infusions:   LOS: 0 days    Time spent: 40 minutes    Irine Seal, MD [facility]   To contact the attending provider between 7A-7P or the covering provider during after hours 7P-7A, please log into the web site [email or web address] and access using universal [place] password for that web site. If you do not have the password, please call the hospital operator.  04/30/2019, 12:49 PM

## 2019-04-30 NOTE — Evaluation (Addendum)
Clinical/Bedside Swallow Evaluation Patient Details  Name: Judy Meza MRN: [id number] Date of Birth: Dec 13, 1960  Today's Date: 04/30/2019 Time: SLP Start Time (ACUTE ONLY): 5400 SLP Stop Time (ACUTE ONLY): 1308 SLP Time Calculation (min) (ACUTE ONLY): 23 min  Past Medical History:  Past Medical History:  Diagnosis Date  . Cancer ([place])   . COPD (chronic obstructive pulmonary disease) ([place])   . Hypertension   . Liver disease   . Renal disorder    Past Surgical History:  Past Surgical History:  Procedure Laterality Date  . PORT-A-CATH REMOVAL     HPI:  [name], 58y/f, admitted with right sided weakness and confusion. PMH significant of COPD, throat cancer, cirrhosis, CKD, HTN, EtOH abuse nad remission, tobacco abuse an dhx of IVDU in remission, Hep C. Prior Peg tube and tracheostomy, both removed 2 months ago. Pt tolerating a regular diet prior to admission.    Assessment / Plan / Recommendation Clinical Impression  Clinical swallow evaluation completed at bedside in setting of possible stroke. Pt has a breathy voice due to open stoma from tracheostomy that hasn't closed after decanulation over 2 months ago. With finger occlusion, pt is able to get 1-2 audible words out. Patients oral motor and cranial nerve evaluations were normal. She tolerated trials of water, ice cream and crackers with no s/s of aspiration. No coughing or clearing or change in respirations. Recommend pt continue with regular diet, thin liquids. To address breathy vocal quality, refer pt to ENT to address pt's stoma. No Speech Therapy follow up needed at this time.  SLP Visit Diagnosis: Dysphagia, unspecified (R13.10)    Aspiration Risk  No limitations    Diet Recommendation Regular;Thin liquid   Liquid Administration via: Cup;Straw Medication Administration: Whole meds with liquid Supervision: Patient able to self feed Compensations: Small sips/bites Postural Changes: Seated upright at 90  degrees;Remain upright for at least 30 minutes after po intake    Other  Recommendations Oral Care Recommendations: Oral care BID   Follow up Recommendations ENT consult- to address open stoma from tracheostomy                 Prognosis Prognosis for Safe Diet Advancement: Good      Swallow Study   General Date of Onset: 04/29/19 HPI: [name], 58y/f, admitted with right sided weakness and confusion. PMH significant of COPD, throat cancer, cirrhosis, CKD, HTN, EtOH abuse nad remission, tobacco abuse an dhx of IVDU in remission, Hep C. Prior Peg tube and tracheostomy, both removed 2 months ago. Pt tolerating a regular diet prior to admission.  Type of Study: Bedside Swallow Evaluation Previous Swallow Assessment: Just moved here from [place], all prior studies in [place], no record on file.  Diet Prior to this Study: Regular;Thin liquids Temperature Spikes Noted: No Respiratory Status: Room air History of Recent Intubation: No Behavior/Cognition: Alert;Cooperative Oral Cavity Assessment: Within Functional Limits Oral Care Completed by SLP: No Oral Cavity - Dentition: Edentulous Vision: Functional for self-feeding Self-Feeding Abilities: Able to feed self Patient Positioning: Upright in chair Baseline Vocal Quality: Breathy(stoma from tracheostomy (decanulated 2 months ago) open) Volitional Cough: Strong Volitional Swallow: Able to elicit    Oral/Motor/Sensory Function Overall Oral Motor/Sensory Function: Within functional limits   Ice Chips Ice chips: Within functional limits Presentation: Spoon   Thin Liquid Thin Liquid: Within functional limits Presentation: Straw    Nectar Thick Nectar Thick Liquid: Not tested   Honey Thick Honey Thick Liquid: Not tested   Puree Puree: Within  functional limits   Solid     Solid: Within functional limits Presentation: Self Phineas Semen., MA CCC-SLP  04/30/2019,2:15 PM

## 2019-04-30 NOTE — Evaluation (Signed)
Occupational Therapy Evaluation Patient Details Name: Judy Meza MRN: [id number] DOB: March 10, 1960 Today's Date: 04/30/2019    History of Present Illness Judy Meza is a 59 y.o. female with medical history significant of COPD, throat cancer, cirrhosis, CKD, HTN, EtOH abuse in remission, tobacco abuse and hx of IVDU in remission, HEpC admitted with confusion and decreased movement of her R side.   Clinical Impression   Pt admitted with the above.  OT did not note any weakness on pts R side.   Pt currently with functional limitations due to the deficits listed below (see OT Problem List).  Pt will benefit from skilled OT to increase their safety and independence with ADL and functional mobility for ADL to facilitate discharge to venue listed below.      Follow Up Recommendations  Home health OT;Supervision/Assistance - 24 hour    Equipment Recommendations  None recommended by OT    Recommendations for Other Services       Precautions / Restrictions Precautions Precautions: Fall Restrictions Weight Bearing Restrictions: No      Mobility Bed Mobility Overal bed mobility: Modified Independent             General bed mobility comments: Pt MOD I to get to sitting and upon return to bed climbed in bed onto her knees prior to rolling over  Transfers Overall transfer level: Needs assistance   Transfers: Sit to/from Stand;Stand Pivot Transfers Sit to Stand: Min guard Stand pivot transfers: Min guard       General transfer comment: S for safety    Balance Overall balance assessment: Needs assistance   Sitting balance-Leahy Scale: Good       Standing balance-Leahy Scale: Fair                             ADL either performed or assessed with clinical judgement   ADL Overall ADL's : Needs assistance/impaired Eating/Feeding: Set up;Sitting   Grooming: Wash/dry face;Standing;Minimal assistance   Upper Body Bathing: Set up;Sitting   Lower Body  Bathing: Minimal assistance;Sit to/from stand   Upper Body Dressing : Set up   Lower Body Dressing: Minimal assistance;Sit to/from stand   Toilet Transfer: Min guard;Stand-pivot;Cueing for safety   Toileting- Clothing Manipulation and Hygiene: Minimal assistance;Sit to/from stand;Cueing for safety;Cueing for compensatory techniques;Cueing for sequencing       Functional mobility during ADLs: Minimal assistance;Cueing for sequencing       Vision Patient Visual Report: No change from baseline              Pertinent Vitals/Pain Pain Assessment: No/denies pain Faces Pain Scale: No hurt     Hand Dominance     Extremity/Trunk Assessment Upper Extremity Assessment Upper Extremity Assessment: Generalized weakness   Lower Extremity Assessment Lower Extremity Assessment: Overall WFL for tasks assessed       Communication Communication Communication: No difficulties   Cognition Arousal/Alertness: Awake/alert Behavior During Therapy: WFL for tasks assessed/performed Overall Cognitive Status: Within Functional Limits for tasks assessed                                                Home Living Family/patient expects to be discharged to:: Private residence Living Arrangements: Children;Other relatives Available Help at Discharge: Family Type of Home: Apartment Home Access: Stairs to enter [employer] of Steps:  flight Entrance Stairs-Rails: Right Home Layout: One level     Bathroom Shower/Tub: Teacher, early years/pre: Standard     Home Equipment: Environmental consultant - 2 wheels;Cane - single point          Prior Functioning/Environment Level of Independence: Independent        Comments: Pt states her family is always with her on the stairs        OT Problem List: Decreased strength;Decreased activity tolerance      OT Treatment/Interventions: Self-care/ADL training;Patient/family education;DME and/or AE instruction    OT  Goals(Current goals can be found in the care plan section) Acute Rehab OT Goals Patient Stated Goal: go home OT Goal Formulation: With patient Time For Goal Achievement: 05/07/19 Potential to Achieve Goals: Good ADL Goals Pt Will Perform Lower Body Dressing: with modified independence;sit to/from stand Pt Will Transfer to Toilet: with modified independence;regular height toilet Pt Will Perform Toileting - Clothing Manipulation and hygiene: with modified independence;sit to/from stand  OT Frequency: Min 2X/week   Barriers to D/C:               AM-PAC OT "6 Clicks" Daily Activity     Outcome Measure Help from another person eating meals?: None Help from another person taking care of personal grooming?: A Little Help from another person toileting, which includes using toliet, bedpan, or urinal?: A Little Help from another person bathing (including washing, rinsing, drying)?: A Little Help from another person to put on and taking off regular upper body clothing?: None Help from another person to put on and taking off regular lower body clothing?: A Little 6 Click Score: 20   End of Session Nurse Communication: Mobility status  Activity Tolerance: Patient limited by fatigue Patient left: in bed;with call bell/phone within reach  OT Visit Diagnosis: Unsteadiness on feet (R26.81);Muscle weakness (generalized) (M62.81);History of falling (Z91.81)                Time: 6808-8110 OT Time Calculation (min): 16 min Charges:  OT General Charges $OT Visit: 1 Visit OT Evaluation $OT Eval Low Complexity: 1 Low  Kari Baars, OT Acute Rehabilitation Services [phone] Office- [phone], Edwena Felty D 04/30/2019, 3:28 PM

## 2019-04-30 NOTE — Evaluation (Signed)
Physical Therapy Evaluation Patient Details Name: Judy Meza MRN: [id number] DOB: 05/14/1960 Today's Date: 04/30/2019   History of Present Illness  Judy Meza is a 59 y.o. female with medical history significant of COPD, throat cancer, cirrhosis, CKD, HTN, EtOH abuse in remission, tobacco abuse and hx of IVDU in remission, HEpC admitted with confusion and decreased movement of her R side.  Clinical Impression  Pt admitted with above diagnosis.  Pt currently with functional limitations due to the deficits listed below (see PT Problem List). Pt will benefit from skilled PT to increase their independence and safety with mobility to allow discharge to the venue listed below.  Pt apprehensive about working with PT, but once she started she was pleasant. She ambulated in hallway with S with no AD.  She has a flight of stairs to enter apartment, but family is normally with her when she does those. Will follow acutely for stair training and higher level balance training, but do not feel she will need any PT after d/c.      Follow Up Recommendations No PT follow up    Equipment Recommendations  None recommended by PT    Recommendations for Other Services       Precautions / Restrictions Precautions Precautions: Fall Restrictions Weight Bearing Restrictions: No      Mobility  Bed Mobility Overal bed mobility: Modified Independent             General bed mobility comments: Pt MOD I to get to sitting and upon return to bed climbed in bed onto her knees prior to rolling over  Transfers Overall transfer level: Needs assistance   Transfers: Sit to/from Stand Sit to Stand: Supervision         General transfer comment: S for safety  Ambulation/Gait Ambulation/Gait assistance: Supervision Gait Distance (Feet): 100 Feet Assistive device: None Gait Pattern/deviations: Step-through pattern;Drifts right/left Gait velocity: WFL   General Gait Details: Pt tends to drift side to  side, but no overt LOB  Stairs            Wheelchair Mobility    Modified Rankin (Stroke Patients Only)       Balance Overall balance assessment: Needs assistance   Sitting balance-Leahy Scale: Good       Standing balance-Leahy Scale: Fair                               Pertinent Vitals/Pain Pain Assessment: Faces Faces Pain Scale: Hurts a little bit Pain Location: throat Pain Intervention(s): Monitored during session    Home Living Family/patient expects to be discharged to:: Private residence Living Arrangements: Children;Other relatives Available Help at Discharge: Family Type of Home: Apartment Home Access: Stairs to enter Entrance Stairs-Rails: Right Entrance Stairs-Number of Steps: flight Home Layout: One level Home Equipment: [place] - 2 wheels;Cane - single point      Prior Function Level of Independence: Independent         Comments: Pt states her family is always with her on the stairs     Hand Dominance        Extremity/Trunk Assessment   Upper Extremity Assessment Upper Extremity Assessment: Defer to OT evaluation    Lower Extremity Assessment Lower Extremity Assessment: Overall WFL for tasks assessed       Communication      Cognition Arousal/Alertness: Awake/alert Behavior During Therapy: WFL for tasks assessed/performed Overall Cognitive Status: Within Functional Limits for tasks assessed  General Comments      Exercises     Assessment/Plan    PT Assessment Patient needs continued PT services  PT Problem List Decreased activity tolerance;Decreased balance;Decreased mobility       PT Treatment Interventions DME instruction;Gait training;Stair training;Functional mobility training;Therapeutic activities;Therapeutic exercise    PT Goals (Current goals can be found in the Care Plan section)  Acute Rehab PT Goals Patient Stated Goal: go home PT Goal  Formulation: With patient Time For Goal Achievement: 05/07/19 Potential to Achieve Goals: Good    Frequency Min 3X/week   Barriers to discharge Inaccessible home environment flight to enter apartment    Co-evaluation               AM-PAC PT "6 Clicks" Mobility  Outcome Measure Help needed turning from your back to your side while in a flat bed without using bedrails?: None Help needed moving from lying on your back to sitting on the side of a flat bed without using bedrails?: None Help needed moving to and from a bed to a chair (including a wheelchair)?: None Help needed standing up from a chair using your arms (e.g., wheelchair or bedside chair)?: None Help needed to walk in hospital room?: A Little Help needed climbing 3-5 steps with a railing? : A Little 6 Click Score: 22    End of Session   Activity Tolerance: Patient tolerated treatment well Patient left: in bed;with call bell/phone within reach;with bed alarm set Nurse Communication: Mobility status PT Visit Diagnosis: Unsteadiness on feet (R26.81);Difficulty in walking, not elsewhere classified (R26.2)    Time: 5093-2671 PT Time Calculation (min) (ACUTE ONLY): 10 min   Charges:   PT Evaluation $PT Eval Low Complexity: 1 Low          Daveyon Kitchings L. Tamala Julian, [place] Pager [phone] 04/30/2019   Judy Meza 04/30/2019, 1:23 PM

## 2019-05-01 DIAGNOSIS — F1021 Alcohol dependence, in remission: Secondary | ICD-10-CM | POA: Diagnosis present

## 2019-05-01 DIAGNOSIS — N183 Chronic kidney disease, stage 3 unspecified: Secondary | ICD-10-CM | POA: Diagnosis present

## 2019-05-01 DIAGNOSIS — N179 Acute kidney failure, unspecified: Secondary | ICD-10-CM | POA: Diagnosis present

## 2019-05-01 DIAGNOSIS — D6959 Other secondary thrombocytopenia: Secondary | ICD-10-CM | POA: Diagnosis present

## 2019-05-01 DIAGNOSIS — K7031 Alcoholic cirrhosis of liver with ascites: Secondary | ICD-10-CM | POA: Diagnosis present

## 2019-05-01 DIAGNOSIS — Z20822 Contact with and (suspected) exposure to covid-19: Secondary | ICD-10-CM | POA: Diagnosis present

## 2019-05-01 DIAGNOSIS — I129 Hypertensive chronic kidney disease with stage 1 through stage 4 chronic kidney disease, or unspecified chronic kidney disease: Secondary | ICD-10-CM | POA: Diagnosis present

## 2019-05-01 DIAGNOSIS — Z85819 Personal history of malignant neoplasm of unspecified site of lip, oral cavity, and pharynx: Secondary | ICD-10-CM | POA: Diagnosis not present

## 2019-05-01 DIAGNOSIS — F17210 Nicotine dependence, cigarettes, uncomplicated: Secondary | ICD-10-CM | POA: Diagnosis present

## 2019-05-01 DIAGNOSIS — R4182 Altered mental status, unspecified: Secondary | ICD-10-CM | POA: Diagnosis present

## 2019-05-01 DIAGNOSIS — K7030 Alcoholic cirrhosis of liver without ascites: Secondary | ICD-10-CM | POA: Diagnosis not present

## 2019-05-01 DIAGNOSIS — K7200 Acute and subacute hepatic failure without coma: Secondary | ICD-10-CM | POA: Diagnosis not present

## 2019-05-01 DIAGNOSIS — Z681 Body mass index (BMI) 19 or less, adult: Secondary | ICD-10-CM | POA: Diagnosis not present

## 2019-05-01 DIAGNOSIS — E46 Unspecified protein-calorie malnutrition: Secondary | ICD-10-CM | POA: Diagnosis present

## 2019-05-01 DIAGNOSIS — E875 Hyperkalemia: Secondary | ICD-10-CM | POA: Diagnosis present

## 2019-05-01 DIAGNOSIS — D61818 Other pancytopenia: Secondary | ICD-10-CM | POA: Diagnosis present

## 2019-05-01 DIAGNOSIS — E860 Dehydration: Secondary | ICD-10-CM | POA: Diagnosis present

## 2019-05-01 DIAGNOSIS — F0390 Unspecified dementia without behavioral disturbance: Secondary | ICD-10-CM | POA: Diagnosis present

## 2019-05-01 DIAGNOSIS — B1920 Unspecified viral hepatitis C without hepatic coma: Secondary | ICD-10-CM | POA: Diagnosis present

## 2019-05-01 DIAGNOSIS — F329 Major depressive disorder, single episode, unspecified: Secondary | ICD-10-CM | POA: Diagnosis present

## 2019-05-01 DIAGNOSIS — K7040 Alcoholic hepatic failure without coma: Secondary | ICD-10-CM | POA: Diagnosis present

## 2019-05-01 DIAGNOSIS — Z66 Do not resuscitate: Secondary | ICD-10-CM | POA: Diagnosis present

## 2019-05-01 DIAGNOSIS — E876 Hypokalemia: Secondary | ICD-10-CM | POA: Diagnosis present

## 2019-05-01 DIAGNOSIS — J449 Chronic obstructive pulmonary disease, unspecified: Secondary | ICD-10-CM | POA: Diagnosis present

## 2019-05-01 LAB — COMPREHENSIVE METABOLIC PANEL
ALT: 23 U/L (ref 0–44)
AST: 37 U/L (ref 15–41)
Albumin: 2.6 g/dL — ABNORMAL LOW (ref 3.5–5.0)
Alkaline Phosphatase: 92 U/L (ref 38–126)
Anion gap: 8 (ref 5–15)
BUN: 25 mg/dL — ABNORMAL HIGH (ref 6–20)
CO2: 15 mmol/L — ABNORMAL LOW (ref 22–32)
Calcium: 8.7 mg/dL — ABNORMAL LOW (ref 8.9–10.3)
Chloride: 110 mmol/L (ref 98–111)
Creatinine, Ser: 1.36 mg/dL — ABNORMAL HIGH (ref 0.44–1.00)
GFR calc Af Amer: 50 mL/min — ABNORMAL LOW (ref >60)
GFR calc non Af Amer: 43 mL/min — ABNORMAL LOW (ref >60)
Glucose, Bld: 126 mg/dL — ABNORMAL HIGH (ref 70–99)
Potassium: 4.8 mmol/L (ref 3.5–5.1)
Sodium: 133 mmol/L — ABNORMAL LOW (ref 135–145)
Total Bilirubin: 0.9 mg/dL (ref 0.3–1.2)
Total Protein: 6.6 g/dL (ref 6.5–8.1)

## 2019-05-01 LAB — CBC WITH DIFFERENTIAL/PLATELET
Abs Immature Granulocytes: 0.01 10*3/uL (ref 0.00–0.07)
Basophils Absolute: 0 10*3/uL (ref 0.0–0.1)
Basophils Relative: 1 %
Eosinophils Absolute: 0 10*3/uL (ref 0.0–0.5)
Eosinophils Relative: 1 %
HCT: 31.3 % — ABNORMAL LOW (ref 36.0–46.0)
Hemoglobin: 10.2 g/dL — ABNORMAL LOW (ref 12.0–15.0)
Immature Granulocytes: 0 %
Lymphocytes Relative: 25 %
Lymphs Abs: 0.7 10*3/uL (ref 0.7–4.0)
MCH: 34.1 pg — ABNORMAL HIGH (ref 26.0–34.0)
MCHC: 32.6 g/dL (ref 30.0–36.0)
MCV: 104.7 fL — ABNORMAL HIGH (ref 80.0–100.0)
Monocytes Absolute: 0.4 10*3/uL (ref 0.1–1.0)
Monocytes Relative: 15 %
Neutro Abs: 1.7 10*3/uL (ref 1.7–7.7)
Neutrophils Relative %: 58 %
Platelets: 56 10*3/uL — ABNORMAL LOW (ref 150–400)
RBC: 2.99 MIL/uL — ABNORMAL LOW (ref 3.87–5.11)
RDW: 16.7 % — ABNORMAL HIGH (ref 11.5–15.5)
WBC: 2.9 10*3/uL — ABNORMAL LOW (ref 4.0–10.5)
nRBC: 0 % (ref 0.0–0.2)

## 2019-05-01 LAB — GLUCOSE, CAPILLARY
Glucose-Capillary: 101 mg/dL — ABNORMAL HIGH (ref 70–99)
Glucose-Capillary: 116 mg/dL — ABNORMAL HIGH (ref 70–99)
Glucose-Capillary: 120 mg/dL — ABNORMAL HIGH (ref 70–99)
Glucose-Capillary: 125 mg/dL — ABNORMAL HIGH (ref 70–99)
Glucose-Capillary: 214 mg/dL — ABNORMAL HIGH (ref 70–99)
Glucose-Capillary: 88 mg/dL (ref 70–99)

## 2019-05-01 LAB — IRON AND TIBC
Iron: 56 ug/dL (ref 28–170)
Saturation Ratios: 16 % (ref 10.4–31.8)
TIBC: 342 ug/dL (ref 250–450)
UIBC: 286 ug/dL

## 2019-05-01 LAB — HCV RNA QUANT
HCV Quantitative Log: 3.458 log10 IU/mL (ref >1.70)
HCV Quantitative: 2870 IU/mL (ref >50)

## 2019-05-01 LAB — FERRITIN: Ferritin: 37 ng/mL (ref 11–307)

## 2019-05-01 LAB — FOLATE: Folate: 6.5 ng/mL (ref >5.9)

## 2019-05-01 LAB — VITAMIN B12: Vitamin B-12: 1161 pg/mL — ABNORMAL HIGH (ref 180–914)

## 2019-05-01 LAB — MAGNESIUM: Magnesium: 1.7 mg/dL (ref 1.7–2.4)

## 2019-05-01 MED ORDER — SODIUM BICARBONATE 650 MG PO TABS
650.0000 mg | ORAL_TABLET | Freq: Two times a day (BID) | ORAL | Status: DC
  Administered 2019-05-01 – 2019-05-02 (×3): 650 mg via ORAL
  Filled 2019-05-01 (×3): qty 1

## 2019-05-01 MED ORDER — MAGNESIUM SULFATE 4 GM/100ML IV SOLN
4.0000 g | Freq: Once | INTRAVENOUS | Status: AC
  Administered 2019-05-01: 4 g via INTRAVENOUS
  Filled 2019-05-01: qty 100

## 2019-05-01 MED ORDER — MELATONIN 3 MG PO TABS
6.0000 mg | ORAL_TABLET | Freq: Every evening | ORAL | Status: DC | PRN
  Administered 2019-05-01: 6 mg via ORAL
  Filled 2019-05-01: qty 2

## 2019-05-01 NOTE — Progress Notes (Addendum)
PROGRESS NOTE    Judy Meza  WIO:[id number] DOB: March 29, 1960 DOA: 04/29/2019 PCP: Patient, No Pcp Per    Chief Complaint  Patient presents with   decreased mobility   Altered Mental Status    Brief Narrative:  Patient is 59 year old female, history of COPD, throat cancer, cirrhosis, chronic kidney disease, hypertension, alcohol abuse in remission, tobacco abuse and history of IV drug use in remission presented with decreased movement in the right upper and lower extremity, worsening confusion.  Patient noted to have had lactulose held secondary to too many bowel movements.  Patient noted to have recently been admitted for dementia and hepatic encephalopathy and after that moved with her family to [place] with no established care yet.  Patient noted to have had a PEG tube as well as tracheostomy in the past however tracheostomy has been decannulated.  Patient admitted for acute hepatic encephalopathy placed on lactulose.  Patient also noted to be in acute renal failure and placed on IV fluids.   Assessment & Plan:   Active Problems:   CKD (chronic kidney disease), stage III   COPD with chronic bronchitis (HCC)   Tobacco abuse   Alcoholism in remission ([place])   Acute hepatic encephalopathy   AKI (acute kidney injury) ([place])   Alcoholic cirrhosis of liver with ascites (HCC)   Thrombocytopenia (HCC)   Hyperkalemia   Cirrhosis of liver (HCC)  1 acute hepatic encephalopathy Likely secondary to lactulose not being given due to increased bowel movements as noted.  Admitting MD's note.  Ammonia level noted to be elevated currently at 63.  CT head done negative for any acute abnormalities.  Patient improving clinically.  Continue Xifaxan and lactulose.  Follow.   2.  Acute renal failure on chronic kidney disease stage III Baseline creatinine noted to be approximately 1.2.  Urinalysis ordered and pending.  Patient placed on IV fluids with improvement with renal function.  Creatinine  currently at 1.36 from 1.67 from 2.03 on admission.  Diuretics and ACE inhibitor on hold.  Continue to hold Aldactone.  Discontinue IV fluids today.  Follow.  3.  COPD Stable.  4.  Tobacco abuse Tobacco cessation.  Nicotine patch.  5.  Alcoholic cirrhosis of the liver with ascites Patient not required paracenteses in over 6 weeks.  Will need outpatient follow-up with GI to establish care.  Follow.  6.  Thrombocytopenia Secondary to cirrhosis.  No bleeding notified.  Follow.  7.  Hyperkalemia Improved.  Potassium at 4.8.  ACE inhibitor discontinued.  Follow.  8.  Tobacco abuse Tobacco cessation.  Nicotine patch.  9.  Alcoholic cirrhosis of the liver with ascites Mild.  Patient has not required paracentesis in about 6 weeks.  Continue lactulose, Xifaxan.  Diuretics on hold due to acute kidney on chronic kidney disease.  Once renal function improves could likely resume diuretics slowly.  Will need to establish care with GI in the outpatient setting.  10.  Thrombocytopenia Secondary to cirrhosis.  No signs of bleeding.  Stable.  11.  Protein calorie malnutrition  12.  History of hepatitis C Not treated.  Will need to follow-up with GI to establish care.  Hepatitis C viral load pending.  HIV nonreactive.  13.  History of throat cancer status post tracheostomy Per sister patient decannulated about 2 to 3 weeks ago.  Will need to establish care with hematology in the outpatient setting.   DVT prophylaxis: SCDs Code Status: DNR Family Communication: Updated patient.  Updated sister via telephone.  Disposition:   Status is: Observation    Dispo: The patient is from: Home              Anticipated d/c is to: Likely back home.              Anticipated d/c date is: 05/02/2019               Patient currently admitted with acute hepatic encephalopathy with clinical improvement.       Consultants:   None  Procedures:  CT head 04/29/2019  Chest x-ray 04/29/2019  Abdominal  ultrasound 04/30/2019  Antimicrobials:   None   Subjective: Patient feeling better.  Denies chest pain or shortness of breath.  Alert and oriented to self place and time.  Tolerating current diet.  Poor oral intake.   Objective: Vitals:   04/30/19 1459 04/30/19 1500 04/30/19 2020 05/01/19 0456  BP:   116/74 136/89  Pulse:   86 85  Resp:   16 18  Temp:   97.9 F (36.6 C) 98.2 F (36.8 C)  TempSrc:   Oral Oral  SpO2: 96% 96% 100% 98%  Weight:      Height:        Intake/Output Summary (Last 24 hours) at 05/01/2019 1119 Last data filed at 05/01/2019 0909 Gross per 24 hour  Intake 1278.75 ml  Output --  Net 1278.75 ml   Filed Weights   04/29/19 1403  Weight: 47.6 kg    Examination:  General exam: NAD.  Bandage over trach site. Respiratory system: CTAB.  No wheezes, no crackles, no rhonchi.  Normal respiratory effort.   Cardiovascular system: Regular rate rhythm no murmurs rubs or gallops.  No JVD.  No lower extremity edema.   Gastrointestinal system: Abdomen is soft, nontender, nondistended, positive bowel sounds.  No rebound.  No guarding.  Central nervous system: Alert and oriented. No focal neurological deficits. Extremities: Symmetric 5 x 5 power. Skin: No rashes, lesions or ulcers Psychiatry: Judgement and insight appear normal. Mood & affect appropriate.     Data Reviewed: I have personally reviewed following labs and imaging studies  CBC: Recent Labs  Lab 04/29/19 1409 04/30/19 0553 05/01/19 0525  WBC 2.6* 2.4* 2.9*  NEUTROABS  --  1.3* 1.7  HGB 11.3* 12.2 10.2*  HCT 33.9* 37.6 31.3*  MCV 102.7* 102.5* 104.7*  PLT 67* 65* 56*    Basic Metabolic Panel: Recent Labs  Lab 04/29/19 1409 04/29/19 2008 04/30/19 0553 05/01/19 0525  NA 135  --  136 133*  K 5.3*  --  5.1 4.8  CL 108  --  109 110  CO2 20*  --  19* 15*  GLUCOSE 82  --  84 126*  BUN 41*  --  35* 25*  CREATININE 2.03*  --  1.67* 1.36*  CALCIUM 9.3  --  9.7 8.7*  MG  --  2.3 2.1 1.7   PHOS  --   --  4.0  --     GFR: Estimated Creatinine Clearance: 34.7 mL/min (A) (by C-G formula based on SCr of 1.36 mg/dL (H)).  Liver Function Tests: Recent Labs  Lab 04/29/19 1409 04/30/19 0553 05/01/19 0525  AST 39 40 37  ALT 22 24 23   ALKPHOS 90 100 92  BILITOT 1.5* 1.7* 0.9  PROT 7.8 8.1 6.6  ALBUMIN 3.2* 3.3* 2.6*    CBG: Recent Labs  Lab 04/30/19 2018 05/01/19 0001 05/01/19 0047 05/01/19 0453 05/01/19 0825  GLUCAP 115* 120* 116* 125* 88  Recent Results (from the past 240 hour(s))  Respiratory Panel by RT PCR (Flu A&B, Covid) - Nasopharyngeal Swab     Status: None   Collection Time: 04/29/19  9:31 PM   Specimen: Nasopharyngeal Swab  Result Value Ref Range Status   SARS Coronavirus 2 by RT PCR NEGATIVE NEGATIVE Final    Comment: (NOTE) SARS-CoV-2 target nucleic acids are NOT DETECTED. The SARS-CoV-2 RNA is generally detectable in upper respiratoy specimens during the acute phase of infection. The lowest concentration of SARS-CoV-2 viral copies this assay can detect is 131 copies/mL. A negative result does not preclude SARS-Cov-2 infection and should not be used as the sole basis for treatment or other patient management decisions. A negative result may occur with  improper specimen collection/handling, submission of specimen other than nasopharyngeal swab, presence of viral mutation(s) within the areas targeted by this assay, and inadequate number of viral copies (<131 copies/mL). A negative result must be combined with clinical observations, patient history, and epidemiological information. The expected result is Negative. Fact Sheet for Patients:  PinkCheek.be Fact Sheet for Healthcare Providers:  GravelBags.it This test is not yet ap proved or cleared by the [place] FDA and  has been authorized for detection and/or diagnosis of SARS-CoV-2 by FDA under an Emergency Use Authorization  (EUA). This EUA will remain  in effect (meaning this test can be used) for the duration of the COVID-19 declaration under Section 564(b)(1) of the Act, 21 U.S.C. section 360bbb-3(b)(1), unless the authorization is terminated or revoked sooner.    Influenza A by PCR NEGATIVE NEGATIVE Final   Influenza B by PCR NEGATIVE NEGATIVE Final    Comment: (NOTE) The Xpert Xpress SARS-CoV-2/FLU/RSV assay is intended as an aid in  the diagnosis of influenza from Nasopharyngeal swab specimens and  should not be used as a sole basis for treatment. Nasal washings and  aspirates are unacceptable for Xpert Xpress SARS-CoV-2/FLU/RSV  testing. Fact Sheet for Patients: PinkCheek.be Fact Sheet for Healthcare Providers: GravelBags.it This test is not yet approved or cleared by the [place] FDA and  has been authorized for detection and/or diagnosis of SARS-CoV-2 by  FDA under an Emergency Use Authorization (EUA). This EUA will remain  in effect (meaning this test can be used) for the duration of the  Covid-19 declaration under Section 564(b)(1) of the Act, 21  U.S.C. section 360bbb-3(b)(1), unless the authorization is  terminated or revoked. Performed at [facility], [place] [address] [place], [place] [address]          Radiology Studies: CT Head Wo Contrast  Result Date: 04/29/2019 CLINICAL DATA:  Altered mental status. Decreased movement of RIGHT arm and leg. EXAM: CT HEAD WITHOUT CONTRAST TECHNIQUE: Contiguous axial images were obtained from the base of the skull through the vertex without intravenous contrast. COMPARISON:  None. FINDINGS: Brain: Ventricles are normal in size. Patchy chronic small vessel ischemic changes are seen within the bilateral periventricular and subcortical white matter. There is a small old lacunar infarct within the LEFT basal ganglia. No mass, hemorrhage or other evidence of acute parenchymal  abnormality. No extra-axial hemorrhage. Vascular: Chronic calcified atherosclerotic changes of the large vessels at the skull base. No unexpected hyperdense vessel. Skull: Normal. Negative for fracture or focal lesion. Sinuses/Orbits: No acute finding. Other: None. IMPRESSION: 1. No acute findings.  No intracranial mass, hemorrhage or edema. 2. Chronic small vessel ischemic changes within the white matter and basal ganglia. Electronically Signed   By: Roxy Horseman.D.  On: 04/29/2019 15:05   US Abdomen Complete  Result Date: 04/30/2019 CLINICAL DATA:  Cirrhosis, acute renal insufficiency, hepatic encephalopathy EXAM: ABDOMEN ULTRASOUND COMPLETE COMPARISON:  None. FINDINGS: Gallbladder: Multiple shadowing gallstones without cholecystitis. Largest calculus measures approximately 6 mm. No gallbladder wall thickening or pericholecystic fluid. Common bile duct: Diameter: 7 mm Liver: Heterogeneous echotexture with nodularity of the liver capsule compatible with cirrhosis. No focal abnormality. Portal vein is patent on color Doppler imaging with normal direction of blood flow towards the liver. IVC: No abnormality visualized. Pancreas: Visualized portion unremarkable. Spleen: Size and appearance within normal limits. Right Kidney: Length: 7.6 cm. Echogenicity within normal limits. No mass or hydronephrosis visualized. Left Kidney: Length: 10.0 cm. Echogenicity within normal limits. No mass or hydronephrosis visualized. Abdominal aorta: Atheromatous plaque throughout the aorta without evidence of aneurysm. Other findings: None. IMPRESSION: 1. Heterogeneous liver echotexture with nodular capsule compatible with cirrhosis. 2. Cholelithiasis without cholecystitis. Electronically Signed   By: Randa Ngo M.D.   On: 04/30/2019 00:36   DG Chest Port 1 View  Result Date: 04/29/2019 CLINICAL DATA:  Altered mental status with decreased movement in the right arm and leg. EXAM: PORTABLE CHEST 1 VIEW COMPARISON:  None.  FINDINGS: The heart size and mediastinal contours are within normal limits. Both lungs are clear. The visualized skeletal structures are unremarkable. IMPRESSION: Normal examination. Electronically Signed   By: Claudie Revering M.D.   On: 04/29/2019 14:51        Scheduled Meds:  feeding supplement (ENSURE ENLIVE)  237 mL Oral BID BM   lactulose  10 g Oral TID   loratadine  10 mg Oral Daily   nicotine  21 mg Transdermal Daily   pantoprazole  40 mg Oral Daily   PARoxetine  40 mg Oral Daily   rifaximin  550 mg Oral BID   sodium bicarbonate  650 mg Oral BID   sodium chloride flush  3 mL Intravenous Once   sodium chloride flush  3 mL Intravenous Q12H   Continuous Infusions:  sodium chloride 75 mL/hr at 04/30/19 1833     LOS: 0 days    Time spent: 35 minutes    Irine Seal, MD [facility]   To contact the attending provider between 7A-7P or the covering provider during after hours 7P-7A, please log into the web site [email or web address] and access using universal [place] password for that web site. If you do not have the password, please call the hospital operator.  05/01/2019, 11:19 AM

## 2019-05-01 NOTE — Progress Notes (Addendum)
Initial Nutrition Assessment  DOCUMENTATION CODES:   (unable to assess for malnutrition at this time.)  INTERVENTION:  - continue Ensure Enlive BID, each supplement provides 350 kcal and 20 grams of protein. - continue to encourage PO intakes. - will monitor for additional nutrition-related needs.    NUTRITION DIAGNOSIS:   Increased nutrient needs related to acute illness, chronic illness, cancer and cancer related treatments as evidenced by estimated needs.  GOAL:   Patient will meet greater than or equal to 90% of their needs  MONITOR:   PO intake, Supplement acceptance, Labs, Weight trends  REASON FOR ASSESSMENT:   Malnutrition Screening Tool, Consult Malnutrition Eval  ASSESSMENT:   59 year old female with medical history of COPD, throat cancer, cirrhosis, stage 3 CKD, HTN, alcohol abuse in remission, tobacco abuse, and hx of IVDU in remission. She presented with decreased movement in the RUE and RLE and worsening confusion. She had recent admission for dementia and hepatic encephalopathy. She recently moved to [facility] and has not yet set up care in the state. She had PEG and trach in the past, trach was decannulated ~2 months ago. She was noted to be in ARF and placed on IV fluids at the time of admission.  Per chart review, she consumed 75% of dinner last night (420 kcal, 19 grams protein) and 75% of breakfast this AM (335 kcal, 15 grams protein). Ensure Enlive ordered BID on 4/18 and patient has accepted all 3 bottles offered to her.   Patient is noted to be a/o x4. She was sleeping at the time of RD visit and did not awake to name call x2. No family/visitors present. Lunch tray on bedside table and appears untouched.   Patient seen by SLP yesterday afternoon and recommendation was for regular foods and thin liquids. SLP note indicates that patient has breathy voice d/t stoma not being completely closed after decannulation. Placing her (patient's) finger over the area allowed  her to have 1-2 audible words.   Per chart review, patient previously had a PEG placed. This is not present on LDA avatar so suspect that it has since been removed.  Weight on 4/17 was 105 lb and no other weight hx available in the chart for patient that recently moved to the state.  Per notes: - acute hepatic encephalopathy--thought to be 2/2 lactulose not being given d/t increased BMs PTA - CT head negative - ARF on stage 3 CKD - alcoholic cirrhosis with ascites and associated thrombocytopenia  - protein calorie malnutrition - hx of hepatitis C - hx of throat cancer    Labs reviewed; CBGs: 88-214 mg/dl, Na: 133 mmol/l, BUN: 25 mg/dl, creatinine: 1.36 mg/dl, Ca: 8.7 mg/dl, GFR: 50 ml/min. Medications reviewed; 10 g lactulose TID, 4 g IV Mg sulfate x1 run 4/19, 40 mg oral protonix/day, 650 mg oral sodium bicarb BID.  IVF; NS @ 75 ml/hr.    NUTRITION - FOCUSED PHYSICAL EXAM:  unable to complete at this time.   Diet Order:   Diet Order            Diet 2 gram sodium Room service appropriate? Yes; Fluid consistency: Thin  Diet effective now              EDUCATION NEEDS:   No education needs have been identified at this time  Skin:  Skin Assessment: Reviewed RN Assessment  Last BM:  PTA/unknown  Height:   Ht Readings from Last 1 Encounters:  04/29/19 5\' 2"  (1.575 m)    Weight:  Wt Readings from Last 1 Encounters:  04/29/19 47.6 kg    Ideal Body Weight:  50 kg  BMI:  Body mass index is 19.2 kg/m.  Estimated Nutritional Needs:   Kcal:  1570-1810 kcal  Protein:  80-95 grams  Fluid:  >/= 2 L/day     Jarome Matin, MS, RD, LDN, CNSC Inpatient Clinical Dietitian RD pager # available in AMION  After hours/weekend pager # available in [facility]

## 2019-05-01 NOTE — Progress Notes (Signed)
Physical Therapy Treatment Patient Details Name: Judy Meza MRN: [id number] DOB: Feb 22, 1960 Today's Date: 05/01/2019    History of Present Illness Judy Meza is a 59 y.o. female with medical history significant of COPD, throat cancer, cirrhosis, CKD, HTN, EtOH abuse in remission, tobacco abuse and hx of IVDU in remission, HEpC admitted with confusion and decreased movement of her R side.    PT Comments    Assisted OOB to amb a great distance in hallway.  General Gait Details: pt easily distracted (looking around) very social.  Used a RW for safety.  Pt will not need post acute hospital stay. Pt just moved here from [facility] and is currently living with her parents.   Follow Up Recommendations  No PT follow up     Equipment Recommendations  None recommended by PT    Recommendations for Other Services       Precautions / Restrictions Precautions Precautions: Fall Restrictions Weight Bearing Restrictions: No    Mobility  Bed Mobility Overal bed mobility: Modified Independent             General bed mobility comments: self able  Transfers Overall transfer level: Needs assistance Equipment used: Rolling walker (2 wheeled) Transfers: Sit to/from [facility] Sit to Stand: Supervision Stand pivot transfers: Min guard;Supervision       General transfer comment: use a walker for safety.  Pt will not need post acute hospital stay.  Ambulation/Gait Ambulation/Gait assistance: Supervision Gait Distance (Feet): 125 Feet Assistive device: Rolling walker (2 wheeled) Gait Pattern/deviations: Step-through pattern Gait velocity: WFL   General Gait Details: pt easily distracted (looking around) very social.  Used a RW for safety.  Pt will not need post acute hospital stay.   Stairs             Wheelchair Mobility    Modified Rankin (Stroke Patients Only)       Balance                                             Cognition Arousal/Alertness: Awake/alert Behavior During Therapy: WFL for tasks assessed/performed Overall Cognitive Status: Within Functional Limits for tasks assessed                                 General Comments: pleasant and loves chocolate ice cream      Exercises      General Comments        Pertinent Vitals/Pain Pain Assessment: No/denies pain    Home Living                      Prior Function            PT Goals (current goals can now be found in the care plan section) Progress towards PT goals: Progressing toward goals    Frequency    Min 3X/week      PT Plan Current plan remains appropriate    Co-evaluation              AM-PAC PT "6 Clicks" Mobility   Outcome Measure  Help needed turning from your back to your side while in a flat bed without using bedrails?: None Help needed moving from lying on your back to sitting on the side of a flat bed without using bedrails?:  None Help needed moving to and from a bed to a chair (including a wheelchair)?: None Help needed standing up from a chair using your arms (e.g., wheelchair or bedside chair)?: None Help needed to walk in hospital room?: A Little Help needed climbing 3-5 steps with a railing? : A Little 6 Click Score: 22    End of Session Equipment Utilized During Treatment: Gait belt Activity Tolerance: Patient tolerated treatment well Patient left: in bed;with call bell/phone within reach;with bed alarm set Nurse Communication: Mobility status PT Visit Diagnosis: Unsteadiness on feet (R26.81);Difficulty in walking, not elsewhere classified (R26.2)     Time: 9983-3825 PT Time Calculation (min) (ACUTE ONLY): 22 min  Charges:  $Gait Training: 8-22 mins                     Rica Koyanagi  PTA Acute  Rehabilitation Services Pager      [phone] Office      [phone]

## 2019-05-02 DIAGNOSIS — D61818 Other pancytopenia: Secondary | ICD-10-CM | POA: Diagnosis present

## 2019-05-02 LAB — CBC
HCT: 28.6 % — ABNORMAL LOW (ref 36.0–46.0)
Hemoglobin: 9.1 g/dL — ABNORMAL LOW (ref 12.0–15.0)
MCH: 33.5 pg (ref 26.0–34.0)
MCHC: 31.8 g/dL (ref 30.0–36.0)
MCV: 105.1 fL — ABNORMAL HIGH (ref 80.0–100.0)
Platelets: 55 10*3/uL — ABNORMAL LOW (ref 150–400)
RBC: 2.72 MIL/uL — ABNORMAL LOW (ref 3.87–5.11)
RDW: 16.8 % — ABNORMAL HIGH (ref 11.5–15.5)
WBC: 2.8 10*3/uL — ABNORMAL LOW (ref 4.0–10.5)
nRBC: 0 % (ref 0.0–0.2)

## 2019-05-02 LAB — BASIC METABOLIC PANEL
Anion gap: 5 (ref 5–15)
BUN: 20 mg/dL (ref 6–20)
CO2: 17 mmol/L — ABNORMAL LOW (ref 22–32)
Calcium: 8.1 mg/dL — ABNORMAL LOW (ref 8.9–10.3)
Chloride: 112 mmol/L — ABNORMAL HIGH (ref 98–111)
Creatinine, Ser: 1.21 mg/dL — ABNORMAL HIGH (ref 0.44–1.00)
GFR calc Af Amer: 58 mL/min — ABNORMAL LOW (ref >60)
GFR calc non Af Amer: 50 mL/min — ABNORMAL LOW (ref >60)
Glucose, Bld: 122 mg/dL — ABNORMAL HIGH (ref 70–99)
Potassium: 4.7 mmol/L (ref 3.5–5.1)
Sodium: 134 mmol/L — ABNORMAL LOW (ref 135–145)

## 2019-05-02 LAB — GLUCOSE, CAPILLARY: Glucose-Capillary: 99 mg/dL (ref 70–99)

## 2019-05-02 LAB — MAGNESIUM: Magnesium: 2.2 mg/dL (ref 1.7–2.4)

## 2019-05-02 MED ORDER — ENSURE ENLIVE PO LIQD: 237.0000 mL | Freq: Two times a day (BID) | ORAL | 12 refills | Status: DC

## 2019-05-02 MED ORDER — SPIRONOLACTONE 100 MG PO TABS: 100.0000 mg | ORAL_TABLET | Freq: Every day | ORAL | Status: DC

## 2019-05-02 MED ORDER — SODIUM BICARBONATE 650 MG PO TABS: 650.0000 mg | ORAL_TABLET | Freq: Two times a day (BID) | ORAL | 0 refills | Status: AC

## 2019-05-02 MED ORDER — FUROSEMIDE 20 MG PO TABS: 20.0000 mg | ORAL_TABLET | Freq: Every day | ORAL | Status: DC

## 2019-05-02 MED ORDER — LACTULOSE 10 GM/15ML PO SOLN: 10.0000 g | Freq: Three times a day (TID) | ORAL | 1 refills | Status: DC

## 2019-05-02 MED ORDER — NICOTINE 21 MG/24HR TD PT24: 21.0000 mg | MEDICATED_PATCH | Freq: Every day | TRANSDERMAL | 0 refills | Status: DC

## 2019-05-02 NOTE — TOC Transition Note (Addendum)
Transition of Care [facility]) - CM/SW Discharge Note   Patient Details  Name: Judy Meza MRN: [id number] Date of Birth: 07-Dec-1960  Transition of Care [facility]) CM/SW Contact:  Ross Ludwig, LCSW Phone Number: 05/02/2019, 12:06 PM   Clinical Narrative:     Patient is a 59 year old female who is alert and oriented x4.  Patient lives in an apartment, and just moved her recently from [place].  CSW received consult that patient needs a PCP, CSW attempted to contact her insurance company and unable to speak to anyone.  CSW spoke to patient's sister and provided contact information for [employer] and [facility] to get patient a PCP.  CSW was informed by patient's sister, that they have spoken to [place] and are trying to get her Medicaid switched to [facility].  CSW provided phone number for DSS to have sister and patient follow up with status update on getting Medicaid.  CSW also provided sister with contact information for her insurance company as well.  Patient did not express any other issues or concerns, patient will be discharging back home today.   Final next level of care: Home/Self Care Barriers to Discharge: Barriers Resolved   Patient Goals and CMS Choice Patient states their goals for this hospitalization and ongoing recovery are:: To return back home CMS Medicare.gov Compare Post Acute Care list provided to:: Patient Represenative (must comment) Choice offered to / list presented to : Sibling  Discharge Placement                       Discharge Plan and Services In-house Referral: Clinical Social Work Discharge Planning Services: Other - See comment(Needs a PCP)              DME Agency: NA       HH Arranged: NA          Social Determinants of Health (SDOH) Interventions     Readmission Risk Interventions No flowsheet data found.

## 2019-05-02 NOTE — Discharge Summary (Addendum)
Physician Discharge Summary  Judy Meza ZJQ:[id number] DOB: 03/08/1960 DOA: 04/29/2019  PCP: Patient, No Pcp Per  Admit date: 04/29/2019 Discharge date: 05/02/2019  Time spent: 50 minutes  Recommendations for Outpatient Follow-up:  1. Follow-up with PCP in 1 to 2 weeks.  On follow-up patient will need a comprehensive metabolic profile to follow-up on electrolytes and renal function.  On follow-up patient will need referral to GI for follow-up on cirrhosis as well as ENT for follow-up on tracheostomy, as well as hematology/oncology for follow-up on history of throat cancer.   Discharge Diagnoses:  Principal Problem:   Acute hepatic encephalopathy Active Problems:   CKD (chronic kidney disease), stage III   COPD with chronic bronchitis (HCC)   Tobacco abuse   Alcoholism in remission ([place])   AKI (acute kidney injury) ([place])   Alcoholic cirrhosis of liver with ascites (HCC)   Thrombocytopenia (HCC)   Hyperkalemia   Cirrhosis of liver (HCC)   Pancytopenia ([place])   Discharge Condition: Stable and improved  Diet recommendation: Heart healthy  Filed Weights   04/29/19 1403  Weight: 47.6 kg    History of present illness:  HPI per Dr. Darryll Capers is a 59 y.o. female with medical history significant of COPD, throat cancer, cirrhosis, CKD, HTN, EtOH abuse in remission, tobacco abuse and hx of IVDU in remission, HEpC    Presented with   patient woke up at 6:30 AM with decreased movement of her right arm and leg and confused, last time seen normal 8 PM.  She was discovered the morning of admission, by her sister in the bathroom.  Patient appeared very confused and family had to feed her.  Patient was having shuffling gait. Pt is originally from [place] and have moved 1 wks ago, prior to that pt was homeless until July when her family took er in ad started to take care of her. Patient continues to smoke Does not drink alcohol She supposed to be on lactulose Family have been  giving her lactulose as needed bc she had too many BM's Per family patient recently was admitted dementia, for hepatic encephalopathy after that she was moved by her family to [place] they have not established any care here.  She used to have a PEG tube and tracheostomy but recently has been doing decannulated She is currently able to take her medication by mouth  Infectious risk factors:  Reports confusion   Has  been vaccinated against COVID x1 Feb 17th   in house  PCR testing  Pending  Recent Labs  No results found for: SARSCOV2NAA    Regarding pertinent Chronic problems:   Hx of throat cancer in May 2020- sp radiation therapy only sp trach and lately been decanulated    HTN on lisinopril 10 qd       COPD - not  followed by pulmonology  Not on baseline oxygen          CKD stage III - baseline Cr unKnown Recent Labs       Lab Results  Component Value Date   CREATININE 2.03 (H) 04/29/2019      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, mild varices With ascites needed paracentesis about 6 wks ago On lactulose, xafaxen, lasix, spironolactone Hx of hepatic encephalopathy  Hx of IV drug use - currently in remission for 5 years  Hx of EtOH - in remission July 2020  Hep C  - no treatment  Tobacco abuse on going  Dementia -  memantin  Depression on Paxil  While in ER: Ammonia noted to be 44 if creatinine after 2 unknown baseline CT head nonacute   Hospitalist was called for admission for acute encephalopath  Hospital Course:  1 acute hepatic encephalopathy Likely secondary to lactulose not being given due to increased bowel movements as noted per Admitting MD's note.  Ammonia level noted to be elevated currently at 63.  CT head done negative for any acute abnormalities.  Patient was placed on scheduled lactulose as well as Xifaxan.  Patient improved clinically and was close to baseline by day of discharge.  Outpatient follow-up.   2.   Acute renal failure on chronic kidney disease stage III Baseline creatinine noted to be approximately 1.2.  Urinalysis ordered and not done.  Patient placed on IV fluids and diuretics held.  Renal function improved and creatinine was down to 1.21 by day of discharge.  Patient's ACE inhibitor will be discontinued on discharge.  Patient to resume Lasix and Aldactone 2 - 3 days post discharge.  Outpatient follow-up with PCP in 1 to 2 weeks.  Follow.  3.  COPD Stable.  4.  Alcoholic cirrhosis of the liver with ascites Patient not required paracenteses in over 6 weeks.  Will need outpatient follow-up with GI to establish care.  Follow.  5.  pancytopenia Secondary to cirrhosis.    No bleeding noted throughout the hospitalization.  Outpatient follow-up.  No bleeding notified.  Follow.  6.  Hyperkalemia Improved.  Potassium at 4.8.  ACE inhibitor discontinued.  Outpatient follow-up.   7.  Tobacco abuse Tobacco cessation stressed to patient.  Patient maintained on a nicotine patch.  8.  Alcoholic cirrhosis of the liver with ascites Patient has not required paracentesis in about 6 weeks.    Patient was placed on the scheduled lactulose, Xifaxan.  Diuretics were held due to acute kidney injury on chronic kidney disease.  Renal function improved.  Patient's diuretics will be resumed 2 to 3 days post discharge.  Patient will need referral from PCP to follow-up with GI in the outpatient setting.  9.  Thrombocytopenia Secondary to cirrhosis.  No signs of bleeding.    Remained stable.   10.  Protein calorie malnutrition Patient was placed on nutritional supplementation.  11.  History of hepatitis C Not treated.  Will need to follow-up with GI to establish care.  Hepatitis C viral load pending.  HIV nonreactive.  12.  History of throat cancer status post tracheostomy Per sister patient decannulated about 2 to 3 weeks ago.  Will need to establish care with ENT and oncology in the outpatient  setting.    Procedures:  CT head 04/29/2019  Chest x-ray 04/29/2019  Abdominal ultrasound 04/30/2019  Consultations:  None  Discharge Exam: Vitals:   05/02/19 0430 05/02/19 0745  BP: 132/90   Pulse: 88 79  Resp: 18 17  Temp: 97.9 F (36.6 C)   SpO2: 100% 97%    General: NAD Cardiovascular: RRR Respiratory: CTAB  Discharge Instructions   Discharge Instructions    Diet - low sodium heart healthy   Complete by: As directed    Increase activity slowly   Complete by: As directed      Allergies as of 05/02/2019   No Known Allergies     Medication List    STOP taking these medications   lisinopril 10 MG tablet Commonly known as: ZESTRIL     TAKE these medications   albuterol 108 (90 Base) MCG/ACT inhaler Commonly known  as: VENTOLIN HFA Inhale 2 puffs into the lungs every 6 (six) hours as needed for wheezing or shortness of breath.   feeding supplement (ENSURE ENLIVE) Liqd Take 237 mLs by mouth 2 (two) times daily between meals.   furosemide 20 MG tablet Commonly known as: LASIX Take 1 tablet (20 mg total) by mouth daily. Start taking on: May 04, 2019 What changed: These instructions start on May 04, 2019. If you are unsure what to do until then, ask your doctor or other care provider.   gabapentin 250 MG/5ML solution Commonly known as: NEURONTIN Take 300 mg by mouth at bedtime.   HYDROmorphone 2 MG tablet Commonly known as: DILAUDID Take 2 mg by mouth every 4 (four) hours as needed for severe pain.   lactulose 10 GM/15ML solution Commonly known as: CHRONULAC Take 15 mLs (10 g total) by mouth 3 (three) times daily. What changed:   when to take this  reasons to take this   loratadine 10 MG tablet Commonly known as: CLARITIN Take 10 mg by mouth daily.   magic mouthwash Soln Take 5 mLs by mouth.   memantine 5 MG tablet Commonly known as: NAMENDA Take 5 mg by mouth 2 (two) times daily.   nicotine 21 mg/24hr patch Commonly known as:  NICODERM CQ - dosed in mg/24 hours Place 1 patch (21 mg total) onto the skin daily. Start taking on: May 03, 2019   pantoprazole 40 MG tablet Commonly known as: PROTONIX Take 40 mg by mouth daily.   PARoxetine 40 MG tablet Commonly known as: PAXIL Take 40 mg by mouth every morning.   rifaximin 550 MG Tabs tablet Commonly known as: XIFAXAN Take 550 mg by mouth 2 (two) times daily.   sodium bicarbonate 650 MG tablet Take 1 tablet (650 mg total) by mouth 2 (two) times daily for 20 days.   spironolactone 100 MG tablet Commonly known as: ALDACTONE Take 1 tablet (100 mg total) by mouth daily. Start taking on: May 04, 2019 What changed: These instructions start on May 04, 2019. If you are unsure what to do until then, ask your doctor or other care provider.   zolpidem 5 MG tablet Commonly known as: AMBIEN Take 2.5 mg by mouth at bedtime as needed for sleep.      No Known Allergies Follow-up Information    PCP. Schedule an appointment as soon as possible for a visit in 2 week(s).   Why: F/U IN 1-2 WEEKS.           The results of significant diagnostics from this hospitalization (including imaging, microbiology, ancillary and laboratory) are listed below for reference.    Significant Diagnostic Studies: CT Head Wo Contrast  Result Date: 04/29/2019 CLINICAL DATA:  Altered mental status. Decreased movement of RIGHT arm and leg. EXAM: CT HEAD WITHOUT CONTRAST TECHNIQUE: Contiguous axial images were obtained from the base of the skull through the vertex without intravenous contrast. COMPARISON:  None. FINDINGS: Brain: Ventricles are normal in size. Patchy chronic small vessel ischemic changes are seen within the bilateral periventricular and subcortical white matter. There is a small old lacunar infarct within the LEFT basal ganglia. No mass, hemorrhage or other evidence of acute parenchymal abnormality. No extra-axial hemorrhage. Vascular: Chronic calcified atherosclerotic  changes of the large vessels at the skull base. No unexpected hyperdense vessel. Skull: Normal. Negative for fracture or focal lesion. Sinuses/Orbits: No acute finding. Other: None. IMPRESSION: 1. No acute findings.  No intracranial mass, hemorrhage or edema. 2. Chronic small  vessel ischemic changes within the white matter and basal ganglia. Electronically Signed   By: Franki Cabot M.D.   On: 04/29/2019 15:05   US Abdomen Complete  Result Date: 04/30/2019 CLINICAL DATA:  Cirrhosis, acute renal insufficiency, hepatic encephalopathy EXAM: ABDOMEN ULTRASOUND COMPLETE COMPARISON:  None. FINDINGS: Gallbladder: Multiple shadowing gallstones without cholecystitis. Largest calculus measures approximately 6 mm. No gallbladder wall thickening or pericholecystic fluid. Common bile duct: Diameter: 7 mm Liver: Heterogeneous echotexture with nodularity of the liver capsule compatible with cirrhosis. No focal abnormality. Portal vein is patent on color Doppler imaging with normal direction of blood flow towards the liver. IVC: No abnormality visualized. Pancreas: Visualized portion unremarkable. Spleen: Size and appearance within normal limits. Right Kidney: Length: 7.6 cm. Echogenicity within normal limits. No mass or hydronephrosis visualized. Left Kidney: Length: 10.0 cm. Echogenicity within normal limits. No mass or hydronephrosis visualized. Abdominal aorta: Atheromatous plaque throughout the aorta without evidence of aneurysm. Other findings: None. IMPRESSION: 1. Heterogeneous liver echotexture with nodular capsule compatible with cirrhosis. 2. Cholelithiasis without cholecystitis. Electronically Signed   By: Randa Ngo M.D.   On: 04/30/2019 00:36   DG Chest Port 1 View  Result Date: 04/29/2019 CLINICAL DATA:  Altered mental status with decreased movement in the right arm and leg. EXAM: PORTABLE CHEST 1 VIEW COMPARISON:  None. FINDINGS: The heart size and mediastinal contours are within normal limits. Both lungs  are clear. The visualized skeletal structures are unremarkable. IMPRESSION: Normal examination. Electronically Signed   By: Claudie Revering M.D.   On: 04/29/2019 14:51    Microbiology: Recent Results (from the past 240 hour(s))  Respiratory Panel by RT PCR (Flu A&B, Covid) - Nasopharyngeal Swab     Status: None   Collection Time: 04/29/19  9:31 PM   Specimen: Nasopharyngeal Swab  Result Value Ref Range Status   SARS Coronavirus 2 by RT PCR NEGATIVE NEGATIVE Final    Comment: (NOTE) SARS-CoV-2 target nucleic acids are NOT DETECTED. The SARS-CoV-2 RNA is generally detectable in upper respiratoy specimens during the acute phase of infection. The lowest concentration of SARS-CoV-2 viral copies this assay can detect is 131 copies/mL. A negative result does not preclude SARS-Cov-2 infection and should not be used as the sole basis for treatment or other patient management decisions. A negative result may occur with  improper specimen collection/handling, submission of specimen other than nasopharyngeal swab, presence of viral mutation(s) within the areas targeted by this assay, and inadequate number of viral copies (<131 copies/mL). A negative result must be combined with clinical observations, patient history, and epidemiological information. The expected result is Negative. Fact Sheet for Patients:  PinkCheek.be Fact Sheet for Healthcare Providers:  GravelBags.it This test is not yet ap proved or cleared by the [place] FDA and  has been authorized for detection and/or diagnosis of SARS-CoV-2 by FDA under an Emergency Use Authorization (EUA). This EUA will remain  in effect (meaning this test can be used) for the duration of the COVID-19 declaration under Section 564(b)(1) of the Act, 21 U.S.C. section 360bbb-3(b)(1), unless the authorization is terminated or revoked sooner.    Influenza A by PCR NEGATIVE NEGATIVE Final    Influenza B by PCR NEGATIVE NEGATIVE Final    Comment: (NOTE) The Xpert Xpress SARS-CoV-2/FLU/RSV assay is intended as an aid in  the diagnosis of influenza from Nasopharyngeal swab specimens and  should not be used as a sole basis for treatment. Nasal washings and  aspirates are unacceptable for Xpert Xpress SARS-CoV-2/FLU/RSV  testing.  Fact Sheet for Patients: PinkCheek.be Fact Sheet for Healthcare Providers: GravelBags.it This test is not yet approved or cleared by the [place] FDA and  has been authorized for detection and/or diagnosis of SARS-CoV-2 by  FDA under an Emergency Use Authorization (EUA). This EUA will remain  in effect (meaning this test can be used) for the duration of the  Covid-19 declaration under Section 564(b)(1) of the Act, 21  U.S.C. section 360bbb-3(b)(1), unless the authorization is  terminated or revoked. Performed at [facility], [place] [address] [place], [place] [address]      Labs: Basic Metabolic Panel: Recent Labs  Lab 04/29/19 1409 04/29/19 2008 04/30/19 0553 05/01/19 0525 05/02/19 0525  NA 135  --  136 133* 134*  K 5.3*  --  5.1 4.8 4.7  CL 108  --  109 110 112*  CO2 20*  --  19* 15* 17*  GLUCOSE 82  --  84 126* 122*  BUN 41*  --  35* 25* 20  CREATININE 2.03*  --  1.67* 1.36* 1.21*  CALCIUM 9.3  --  9.7 8.7* 8.1*  MG  --  2.3 2.1 1.7 2.2  PHOS  --   --  4.0  --   --    Liver Function Tests: Recent Labs  Lab 04/29/19 1409 04/30/19 0553 05/01/19 0525  AST 39 40 37  ALT 22 24 23   ALKPHOS 90 100 92  BILITOT 1.5* 1.7* 0.9  PROT 7.8 8.1 6.6  ALBUMIN 3.2* 3.3* 2.6*   No results for input(s): LIPASE, AMYLASE in the last 168 hours. Recent Labs  Lab 04/29/19 1424 04/30/19 0553  AMMONIA 44* 63*   CBC: Recent Labs  Lab 04/29/19 1409 04/30/19 0553 05/01/19 0525 05/02/19 0525  WBC 2.6* 2.4* 2.9* 2.8*  NEUTROABS  --  1.3* 1.7  --   HGB 11.3* 12.2  10.2* 9.1*  HCT 33.9* 37.6 31.3* 28.6*  MCV 102.7* 102.5* 104.7* 105.1*  PLT 67* 65* 56* 55*   Cardiac Enzymes: Recent Labs  Lab 04/29/19 2008  CKTOTAL 43   BNP: BNP (last 3 results) Recent Labs    04/30/19 0553  BNP 21.4    ProBNP (last 3 results) No results for input(s): PROBNP in the last 8760 hours.  CBG: Recent Labs  Lab 05/01/19 0453 05/01/19 0825 05/01/19 1146 05/01/19 2139 05/02/19 0746  GLUCAP 125* 88 214* 101* 99       Signed:  Irine Seal MD.  [facility] 05/02/2019, 11:12 AM

## 2019-05-02 NOTE — Plan of Care (Signed)
Discharge instructions reviewed with patient, questions answered, verbalized understanding.  Patient ambulated to main entrance accompanied by nurse tech to be taken home by sister.

## 2019-05-09 ENCOUNTER — Encounter (HOSPITAL_COMMUNITY): Payer: Self-pay | Admitting: *Deleted

## 2019-05-09 ENCOUNTER — Emergency Department (HOSPITAL_COMMUNITY): Payer: Medicaid Other

## 2019-05-09 ENCOUNTER — Other Ambulatory Visit: Payer: Self-pay

## 2019-05-09 ENCOUNTER — Inpatient Hospital Stay (HOSPITAL_COMMUNITY)
Admission: EM | Admit: 2019-05-09 | Discharge: 2019-05-11 | DRG: 433 | Disposition: A | Discharge status: Home / Self Care | Payer: Medicaid Other | Attending: Internal Medicine | Admitting: Internal Medicine

## 2019-05-09 DIAGNOSIS — Z66 Do not resuscitate: Secondary | ICD-10-CM | POA: Diagnosis present

## 2019-05-09 DIAGNOSIS — K7290 Hepatic failure, unspecified without coma: Secondary | ICD-10-CM | POA: Diagnosis not present

## 2019-05-09 DIAGNOSIS — B182 Chronic viral hepatitis C: Secondary | ICD-10-CM | POA: Diagnosis present

## 2019-05-09 DIAGNOSIS — N1832 Chronic kidney disease, stage 3b: Secondary | ICD-10-CM | POA: Diagnosis present

## 2019-05-09 DIAGNOSIS — N179 Acute kidney failure, unspecified: Secondary | ICD-10-CM | POA: Diagnosis present

## 2019-05-09 DIAGNOSIS — F1011 Alcohol abuse, in remission: Secondary | ICD-10-CM | POA: Diagnosis present

## 2019-05-09 DIAGNOSIS — F0390 Unspecified dementia without behavioral disturbance: Secondary | ICD-10-CM | POA: Diagnosis present

## 2019-05-09 DIAGNOSIS — K7040 Alcoholic hepatic failure without coma: Secondary | ICD-10-CM | POA: Diagnosis present

## 2019-05-09 DIAGNOSIS — Z85819 Personal history of malignant neoplasm of unspecified site of lip, oral cavity, and pharynx: Secondary | ICD-10-CM

## 2019-05-09 DIAGNOSIS — D696 Thrombocytopenia, unspecified: Secondary | ICD-10-CM | POA: Diagnosis not present

## 2019-05-09 DIAGNOSIS — K7031 Alcoholic cirrhosis of liver with ascites: Secondary | ICD-10-CM | POA: Diagnosis present

## 2019-05-09 DIAGNOSIS — N184 Chronic kidney disease, stage 4 (severe): Secondary | ICD-10-CM | POA: Diagnosis present

## 2019-05-09 DIAGNOSIS — Z681 Body mass index (BMI) 19 or less, adult: Secondary | ICD-10-CM

## 2019-05-09 DIAGNOSIS — Z20822 Contact with and (suspected) exposure to covid-19: Secondary | ICD-10-CM | POA: Diagnosis present

## 2019-05-09 DIAGNOSIS — I129 Hypertensive chronic kidney disease with stage 1 through stage 4 chronic kidney disease, or unspecified chronic kidney disease: Secondary | ICD-10-CM | POA: Diagnosis present

## 2019-05-09 DIAGNOSIS — J449 Chronic obstructive pulmonary disease, unspecified: Secondary | ICD-10-CM | POA: Diagnosis present

## 2019-05-09 DIAGNOSIS — K7030 Alcoholic cirrhosis of liver without ascites: Secondary | ICD-10-CM | POA: Diagnosis not present

## 2019-05-09 DIAGNOSIS — Z923 Personal history of irradiation: Secondary | ICD-10-CM

## 2019-05-09 DIAGNOSIS — Z7189 Other specified counseling: Secondary | ICD-10-CM | POA: Diagnosis not present

## 2019-05-09 DIAGNOSIS — E872 Acidosis, unspecified: Secondary | ICD-10-CM | POA: Diagnosis present

## 2019-05-09 DIAGNOSIS — D61818 Other pancytopenia: Secondary | ICD-10-CM | POA: Diagnosis not present

## 2019-05-09 DIAGNOSIS — I10 Essential (primary) hypertension: Secondary | ICD-10-CM | POA: Diagnosis not present

## 2019-05-09 DIAGNOSIS — K7460 Unspecified cirrhosis of liver: Secondary | ICD-10-CM | POA: Diagnosis present

## 2019-05-09 DIAGNOSIS — R4182 Altered mental status, unspecified: Secondary | ICD-10-CM | POA: Diagnosis present

## 2019-05-09 DIAGNOSIS — F329 Major depressive disorder, single episode, unspecified: Secondary | ICD-10-CM | POA: Diagnosis present

## 2019-05-09 DIAGNOSIS — R404 Transient alteration of awareness: Secondary | ICD-10-CM | POA: Diagnosis not present

## 2019-05-09 DIAGNOSIS — Z515 Encounter for palliative care: Secondary | ICD-10-CM | POA: Diagnosis not present

## 2019-05-09 DIAGNOSIS — N183 Chronic kidney disease, stage 3 unspecified: Secondary | ICD-10-CM

## 2019-05-09 DIAGNOSIS — D631 Anemia in chronic kidney disease: Secondary | ICD-10-CM | POA: Diagnosis present

## 2019-05-09 DIAGNOSIS — F17210 Nicotine dependence, cigarettes, uncomplicated: Secondary | ICD-10-CM | POA: Diagnosis present

## 2019-05-09 DIAGNOSIS — R0902 Hypoxemia: Secondary | ICD-10-CM | POA: Diagnosis not present

## 2019-05-09 DIAGNOSIS — K7200 Acute and subacute hepatic failure without coma: Secondary | ICD-10-CM

## 2019-05-09 DIAGNOSIS — R410 Disorientation, unspecified: Secondary | ICD-10-CM | POA: Diagnosis not present

## 2019-05-09 DIAGNOSIS — E441 Mild protein-calorie malnutrition: Secondary | ICD-10-CM | POA: Diagnosis present

## 2019-05-09 DIAGNOSIS — K7682 Hepatic encephalopathy: Secondary | ICD-10-CM | POA: Diagnosis present

## 2019-05-09 DIAGNOSIS — K219 Gastro-esophageal reflux disease without esophagitis: Secondary | ICD-10-CM | POA: Diagnosis present

## 2019-05-09 DIAGNOSIS — Z720 Tobacco use: Secondary | ICD-10-CM | POA: Diagnosis present

## 2019-05-09 DIAGNOSIS — R531 Weakness: Secondary | ICD-10-CM | POA: Diagnosis not present

## 2019-05-09 LAB — RESPIRATORY PANEL BY RT PCR (FLU A&B, COVID)
Influenza A by PCR: NEGATIVE
Influenza B by PCR: NEGATIVE
SARS Coronavirus 2 by RT PCR: NEGATIVE

## 2019-05-09 LAB — CBG MONITORING, ED: Glucose-Capillary: 92 mg/dL (ref 70–99)

## 2019-05-09 LAB — CBC
HCT: 29.8 % — ABNORMAL LOW (ref 36.0–46.0)
Hemoglobin: 9.8 g/dL — ABNORMAL LOW (ref 12.0–15.0)
MCH: 33.2 pg (ref 26.0–34.0)
MCHC: 32.9 g/dL (ref 30.0–36.0)
MCV: 101 fL — ABNORMAL HIGH (ref 80.0–100.0)
Platelets: 56 10*3/uL — ABNORMAL LOW (ref 150–400)
RBC: 2.95 MIL/uL — ABNORMAL LOW (ref 3.87–5.11)
RDW: 17.2 % — ABNORMAL HIGH (ref 11.5–15.5)
WBC: 2.8 10*3/uL — ABNORMAL LOW (ref 4.0–10.5)
nRBC: 0 % (ref 0.0–0.2)

## 2019-05-09 LAB — COMPREHENSIVE METABOLIC PANEL
ALT: 26 U/L (ref 0–44)
AST: 41 U/L (ref 15–41)
Albumin: 3.3 g/dL — ABNORMAL LOW (ref 3.5–5.0)
Alkaline Phosphatase: 93 U/L (ref 38–126)
Anion gap: 9 (ref 5–15)
BUN: 29 mg/dL — ABNORMAL HIGH (ref 6–20)
CO2: 19 mmol/L — ABNORMAL LOW (ref 22–32)
Calcium: 9.3 mg/dL (ref 8.9–10.3)
Chloride: 109 mmol/L (ref 98–111)
Creatinine, Ser: 1.61 mg/dL — ABNORMAL HIGH (ref 0.44–1.00)
GFR calc Af Amer: 41 mL/min — ABNORMAL LOW (ref >60)
GFR calc non Af Amer: 35 mL/min — ABNORMAL LOW (ref >60)
Glucose, Bld: 110 mg/dL — ABNORMAL HIGH (ref 70–99)
Potassium: 4.6 mmol/L (ref 3.5–5.1)
Sodium: 137 mmol/L (ref 135–145)
Total Bilirubin: 1.7 mg/dL — ABNORMAL HIGH (ref 0.3–1.2)
Total Protein: 7.7 g/dL (ref 6.5–8.1)

## 2019-05-09 LAB — I-STAT BETA HCG BLOOD, ED (MC, WL, AP ONLY): I-stat hCG, quantitative: <5 m[IU]/mL (ref <5)

## 2019-05-09 LAB — AMMONIA: Ammonia: 106 umol/L — ABNORMAL HIGH (ref 9–35)

## 2019-05-09 LAB — APTT: aPTT: 36 seconds (ref 24–36)

## 2019-05-09 LAB — PROTIME-INR
INR: 1.2 (ref 0.8–1.2)
Prothrombin Time: 14.7 seconds (ref 11.4–15.2)

## 2019-05-09 MED ORDER — LORATADINE 10 MG PO TABS
10.0000 mg | ORAL_TABLET | Freq: Every day | ORAL | Status: DC
  Administered 2019-05-10 – 2019-05-11 (×2): 10 mg via ORAL
  Filled 2019-05-09 (×2): qty 1

## 2019-05-09 MED ORDER — SODIUM CHLORIDE 0.9% FLUSH: 3.0000 mL | Freq: Once | INTRAVENOUS | Status: DC

## 2019-05-09 MED ORDER — PAROXETINE HCL 20 MG PO TABS
40.0000 mg | ORAL_TABLET | Freq: Every day | ORAL | Status: DC
  Administered 2019-05-10 – 2019-05-11 (×2): 40 mg via ORAL
  Filled 2019-05-09 (×2): qty 2

## 2019-05-09 MED ORDER — MELATONIN 3 MG PO TABS: 6.0000 mg | ORAL_TABLET | Freq: Every evening | ORAL | Status: DC | PRN

## 2019-05-09 MED ORDER — RIFAXIMIN 550 MG PO TABS
550.0000 mg | ORAL_TABLET | Freq: Two times a day (BID) | ORAL | Status: DC
  Administered 2019-05-09 – 2019-05-11 (×5): 550 mg via ORAL
  Filled 2019-05-09 (×5): qty 1

## 2019-05-09 MED ORDER — MEMANTINE HCL 10 MG PO TABS
5.0000 mg | ORAL_TABLET | Freq: Two times a day (BID) | ORAL | Status: DC
  Administered 2019-05-09 – 2019-05-11 (×4): 5 mg via ORAL
  Filled 2019-05-09 (×4): qty 1

## 2019-05-09 MED ORDER — ACETAMINOPHEN 650 MG RE SUPP: 650.0000 mg | Freq: Four times a day (QID) | RECTAL | Status: DC | PRN

## 2019-05-09 MED ORDER — PANTOPRAZOLE SODIUM 40 MG PO TBEC
40.0000 mg | DELAYED_RELEASE_TABLET | Freq: Every day | ORAL | Status: DC
  Administered 2019-05-10 – 2019-05-11 (×2): 40 mg via ORAL
  Filled 2019-05-09 (×2): qty 1

## 2019-05-09 MED ORDER — LACTULOSE ENEMA
300.0000 mL | Freq: Once | ORAL | Status: AC
  Administered 2019-05-09: 300 mL via RECTAL
  Filled 2019-05-09: qty 300

## 2019-05-09 MED ORDER — LACTULOSE 10 GM/15ML PO SOLN
30.0000 g | Freq: Three times a day (TID) | ORAL | Status: DC
  Administered 2019-05-09 – 2019-05-11 (×5): 30 g via ORAL
  Filled 2019-05-09 (×5): qty 45

## 2019-05-09 MED ORDER — ONDANSETRON HCL 4 MG/2ML IJ SOLN: 4.0000 mg | Freq: Four times a day (QID) | INTRAMUSCULAR | Status: DC | PRN

## 2019-05-09 MED ORDER — ONDANSETRON HCL 4 MG PO TABS: 4.0000 mg | ORAL_TABLET | Freq: Four times a day (QID) | ORAL | Status: DC | PRN

## 2019-05-09 MED ORDER — ACETAMINOPHEN 325 MG PO TABS: 650.0000 mg | ORAL_TABLET | Freq: Four times a day (QID) | ORAL | Status: DC | PRN

## 2019-05-09 MED ORDER — SODIUM BICARBONATE 650 MG PO TABS
650.0000 mg | ORAL_TABLET | Freq: Two times a day (BID) | ORAL | Status: DC
  Administered 2019-05-09 – 2019-05-11 (×5): 650 mg via ORAL
  Filled 2019-05-09 (×5): qty 1

## 2019-05-09 MED ORDER — ALBUTEROL SULFATE HFA 108 (90 BASE) MCG/ACT IN AERS
2.0000 | INHALATION_SPRAY | Freq: Four times a day (QID) | RESPIRATORY_TRACT | Status: DC | PRN
  Filled 2019-05-09: qty 6.7

## 2019-05-09 MED ORDER — SODIUM CHLORIDE 0.9 % IV SOLN: INTRAVENOUS | Status: AC

## 2019-05-09 NOTE — ED Notes (Signed)
ED TO INPATIENT HANDOFF REPORT  ED Nurse Name and Phone #: [phone]  S Name/Age/Gender Judy Meza 59 y.o. female Room/Bed: WA19/WA19  Code Status   Code Status: DNR  Home/SNF/Other Home Patient oriented to: self, place and time Is this baseline? Yes   Triage Complete: Triage complete  Chief Complaint Hepatic encephalopathy ([place]) [K72.90]  Triage Note BIB EMS, AMS  Stated by family, pt unable to follow commands, elevated ammonia levels in past resulting in AMS. Pt in [place]. 168/100-77-96% RA -  CBG 131    Allergies No Known Allergies  Level of Care/Admitting Diagnosis ED Disposition    ED Disposition Condition Comment   Admit  Hospital Area: [facility] [[id number]]  Level of Care: Med-Surg [16]  May admit patient to [facility] or [facility] if equivalent level of care is available:: Yes  Covid Evaluation: Asymptomatic Screening Protocol (No Symptoms)  Diagnosis: Hepatic encephalopathy ([place]) [572.2.ICD-9-CM]  Admitting Physician: Aline August [[id number]]  Attending Physician: Aline August [[id number]]  Estimated length of stay: 3 - 4 days  Certification:: I certify this patient will need inpatient services for at least 2 midnights       B Medical/Surgery History Past Medical History:  Diagnosis Date  . Cancer ([place])   . COPD (chronic obstructive pulmonary disease) ([place])   . Hypertension   . Liver disease   . Renal disorder    Past Surgical History:  Procedure Laterality Date  . PORT-A-CATH REMOVAL       A IV Location/Drains/Wounds Patient Lines/Drains/Airways Status   Active Line/Drains/Airways    Name:   Placement date:   Placement time:   Site:   Days:   Peripheral IV 05/09/19 Right;Upper Arm   05/09/19    1051    Arm   less than 1          Intake/Output Last 24 hours No intake or output data in the 24 hours ending 05/09/19 1149  Labs/Imaging Results for orders placed or performed during the hospital encounter of 05/09/19  (from the past 48 hour(s))  CBG monitoring, ED     Status: None   Collection Time: 05/09/19  8:49 AM  Result Value Ref Range   Glucose-Capillary 92 70 - 99 mg/dL    Comment: Glucose reference range applies only to samples taken after fasting for at least 8 hours.   Comment 1 Notify RN   Comprehensive metabolic panel     Status: Abnormal   Collection Time: 05/09/19  9:30 AM  Result Value Ref Range   Sodium 137 135 - 145 mmol/L   Potassium 4.6 3.5 - 5.1 mmol/L   Chloride 109 98 - 111 mmol/L   CO2 19 (L) 22 - 32 mmol/L   Glucose, Bld 110 (H) 70 - 99 mg/dL    Comment: Glucose reference range applies only to samples taken after fasting for at least 8 hours.   BUN 29 (H) 6 - 20 mg/dL   Creatinine, Ser 1.61 (H) 0.44 - 1.00 mg/dL   Calcium 9.3 8.9 - 10.3 mg/dL   Total Protein 7.7 6.5 - 8.1 g/dL   Albumin 3.3 (L) 3.5 - 5.0 g/dL   AST 41 15 - 41 U/L   ALT 26 0 - 44 U/L   Alkaline Phosphatase 93 38 - 126 U/L   Total Bilirubin 1.7 (H) 0.3 - 1.2 mg/dL   GFR calc non Af Amer 35 (L) >60 mL/min   GFR calc Af Amer 41 (L) >60 mL/min   Anion  gap 9 5 - 15    Comment: Performed at [facility], [place] [address], [place], [place] [address]  CBC     Status: Abnormal   Collection Time: 05/09/19  9:30 AM  Result Value Ref Range   WBC 2.8 (L) 4.0 - 10.5 K/uL   RBC 2.95 (L) 3.87 - 5.11 MIL/uL   Hemoglobin 9.8 (L) 12.0 - 15.0 g/dL   HCT 29.8 (L) 36.0 - 46.0 %   MCV 101.0 (H) 80.0 - 100.0 fL   MCH 33.2 26.0 - 34.0 pg   MCHC 32.9 30.0 - 36.0 g/dL   RDW 17.2 (H) 11.5 - 15.5 %   Platelets 56 (L) 150 - 400 K/uL    Comment: Immature Platelet Fraction may be clinically indicated, consider ordering this additional test [id number]    nRBC 0.0 0.0 - 0.2 %    Comment: Performed at [facility], [place] [address] [place], [place] [address]  Ammonia     Status: Abnormal   Collection Time: 05/09/19  9:30 AM  Result Value Ref Range   Ammonia 106 (H) 9 - 35 umol/L    Comment:  Performed at [facility], [place] [address], [place], [place] [address]  Protime-INR     Status: None   Collection Time: 05/09/19  9:30 AM  Result Value Ref Range   Prothrombin Time 14.7 11.4 - 15.2 seconds   INR 1.2 0.8 - 1.2    Comment: (NOTE) INR goal varies based on device and disease states. Performed at [facility], [place] [address], [place], [place] [address]   APTT     Status: None   Collection Time: 05/09/19  9:30 AM  Result Value Ref Range   aPTT 36 24 - 36 seconds    Comment: Performed at [facility], [place] [address] [place], [place] [address]  Respiratory Panel by RT PCR (Flu A&B, Covid) - Nasopharyngeal Swab     Status: None   Collection Time: 05/09/19  9:30 AM   Specimen: Nasopharyngeal Swab  Result Value Ref Range   SARS Coronavirus 2 by RT PCR NEGATIVE NEGATIVE    Comment: (NOTE) SARS-CoV-2 target nucleic acids are NOT DETECTED. The SARS-CoV-2 RNA is generally detectable in upper respiratoy specimens during the acute phase of infection. The lowest concentration of SARS-CoV-2 viral copies this assay can detect is 131 copies/mL. A negative result does not preclude SARS-Cov-2 infection and should not be used as the sole basis for treatment or other patient management decisions. A negative result may occur with  improper specimen collection/handling, submission of specimen other than nasopharyngeal swab, presence of viral mutation(s) within the areas targeted by this assay, and inadequate number of viral copies (<131 copies/mL). A negative result must be combined with clinical observations, patient history, and epidemiological information. The expected result is Negative. Fact Sheet for Patients:  PinkCheek.be Fact Sheet for Healthcare Providers:  GravelBags.it This test is not yet ap proved or cleared by the [place] FDA and  has been authorized for  detection and/or diagnosis of SARS-CoV-2 by FDA under an Emergency Use Authorization (EUA). This EUA will remain  in effect (meaning this test can be used) for the duration of the COVID-19 declaration under Section 564(b)(1) of the Act, 21 U.S.C. section 360bbb-3(b)(1), unless the authorization is terminated or revoked sooner.    Influenza A by PCR NEGATIVE NEGATIVE   Influenza B by PCR NEGATIVE NEGATIVE    Comment: (NOTE) The Xpert  Xpress SARS-CoV-2/FLU/RSV assay is intended as an aid in  the diagnosis of influenza from Nasopharyngeal swab specimens and  should not be used as a sole basis for treatment. Nasal washings and  aspirates are unacceptable for Xpert Xpress SARS-CoV-2/FLU/RSV  testing. Fact Sheet for Patients: PinkCheek.be Fact Sheet for Healthcare Providers: GravelBags.it This test is not yet approved or cleared by the [place] FDA and  has been authorized for detection and/or diagnosis of SARS-CoV-2 by  FDA under an Emergency Use Authorization (EUA). This EUA will remain  in effect (meaning this test can be used) for the duration of the  Covid-19 declaration under Section 564(b)(1) of the Act, 21  U.S.C. section 360bbb-3(b)(1), unless the authorization is  terminated or revoked. Performed at [facility], [place] [address], [place], [place] [address]   I-Stat beta hCG blood, ED     Status: None   Collection Time: 05/09/19  9:46 AM  Result Value Ref Range   I-stat hCG, quantitative <5.0 <5 mIU/mL   Comment 3            Comment:   GEST. AGE      CONC.  (mIU/mL)   <=1 WEEK        5 - 50     2 WEEKS       50 - 500     3 WEEKS       100 - 10,000     4 WEEKS     1,000 - 30,000        FEMALE AND NON-PREGNANT FEMALE:     LESS THAN 5 mIU/mL    DG Chest Port 1 View  Result Date: 05/09/2019 CLINICAL DATA:  Altered mental status. EXAM: PORTABLE CHEST 1 VIEW COMPARISON:  April 29, 2019. FINDINGS:  The heart size and mediastinal contours are within normal limits. Both lungs are clear. The visualized skeletal structures are unremarkable. IMPRESSION: No active disease. Electronically Signed   By: Marijo Conception M.D.   On: 05/09/2019 10:46    Pending Labs Unresulted Labs (From admission, onward)    Start     Ordered   05/10/19 0500  Comprehensive metabolic panel  Tomorrow morning,   R     05/09/19 1042   05/10/19 0500  CBC  Tomorrow morning,   R     05/09/19 1042   05/10/19 0500  Protime-INR  Tomorrow morning,   R     05/09/19 1042          Vitals/Pain Today's Vitals   05/09/19 0945 05/09/19 1045 05/09/19 1115 05/09/19 1130  BP: (!) 159/108 (!) 143/86 (!) 153/88 (!) 146/92  Pulse: 78 81 83 84  Resp: 14 10 15 13   Temp:      TempSrc:      SpO2: 100% 100% 100% 99%  Weight:      PainSc:        Isolation Precautions No active isolations  Medications Medications  sodium chloride flush (NS) 0.9 % injection 3 mL (3 mLs Intravenous Not Given 05/09/19 1050)  rifaximin (XIFAXAN) tablet 550 mg (has no administration in time range)  memantine (NAMENDA) tablet 5 mg (has no administration in time range)  PARoxetine (PAXIL) tablet 40 mg (has no administration in time range)  lactulose (CHRONULAC) 10 GM/15ML solution 30 g (has no administration in time range)  pantoprazole (PROTONIX) EC tablet 40 mg (has no administration in time range)  sodium bicarbonate tablet 650 mg (has no administration in time range)  albuterol (VENTOLIN HFA) 108 (90 Base) MCG/ACT inhaler 2 puff (has no administration in time range)  loratadine (CLARITIN) tablet 10 mg (has no administration in time range)  acetaminophen (TYLENOL) tablet 650 mg (has no administration in time range)    Or  acetaminophen (TYLENOL) suppository 650 mg (has no administration in time range)  ondansetron (ZOFRAN) tablet 4 mg (has no administration in time range)    Or  ondansetron (ZOFRAN) injection 4 mg (has no administration in  time range)  lactulose (CHRONULAC) enema 200 gm (has no administration in time range)  0.9 %  sodium chloride infusion (has no administration in time range)    Mobility non-ambulatory High fall risk   Focused Assessments Neuro Assessment Handoff:  Swallow screen pass? Not performed          Neuro Assessment: Exceptions to WDL Neuro Checks:      Last Documented NIHSS Modified Score:   Has TPA been given? No If patient is a Neuro Trauma and patient is going to OR before floor call report to [place] nurse: [phone] or [phone]     R Recommendations: See Admitting Provider Note  Report given to:   Additional Notes:

## 2019-05-09 NOTE — ED Provider Notes (Signed)
[facility] DEPT Provider Note   CSN: [id number] Arrival date & time: 05/09/19  2563     History Chief Complaint  Patient presents with  . Altered Mental Status    Judy Meza is a 59 y.o. female.  HPI     58 year old female with history of liver cirrhosis, COPD, CKD comes in a chief complaint of altered mental status.  Level 5 caveat for altered mental status.  According to patient's sister, patient was doing well until about 2 days ago when she started getting sluggish.  Yesterday she started defecating on herself.  Patient was still mentating okay and feeding herself, but this morning she looks worse therefore they decided to bring her in.  Patient was admitted to the hospital recently for hepatic encephalopathy.  Patient has been taking her medications as prescribed.  Family denies any history of fevers, chills.  Patient has only had ascitic fluid, about 1 L of it drained once before while they were still in [place].  Patient does not complain of any pain.  No vomiting, sick exposures.  Patient got her second dose of vaccine 4 days back.  Past Medical History:  Diagnosis Date  . Cancer ([place])   . COPD (chronic obstructive pulmonary disease) ([place])   . Hypertension   . Liver disease   . Renal disorder     Patient Active Problem List   Diagnosis Date Noted  . Hepatic encephalopathy ([place]) 05/09/2019  . Pancytopenia ([place])   . Cirrhosis of liver ([place])   . CKD (chronic kidney disease), stage III 04/29/2019  . COPD with chronic bronchitis ([place]) 04/29/2019  . Tobacco abuse 04/29/2019  . Alcoholism in remission ([place]) 04/29/2019  . Acute hepatic encephalopathy 04/29/2019  . AKI (acute kidney injury) ([place]) 04/29/2019  . Alcoholic cirrhosis of liver with ascites ([place]) 04/29/2019  . Thrombocytopenia ([place]) 04/29/2019  . Hyperkalemia 04/29/2019    Past Surgical History:  Procedure Laterality Date  . PORT-A-CATH REMOVAL       OB History    No obstetric history on file.     Family History  Problem Relation Age of Onset  . Healthy Mother   . Hypertension Other     Social History   Tobacco Use  . Smoking status: Current Every Day Smoker    Packs/day: 0.25    Types: Cigarettes  . Smokeless tobacco: Never Used  Substance Use Topics  . Alcohol use: Not Currently  . Drug use: Not Currently    Home Medications Prior to Admission medications   Medication Sig Start Date End Date Taking? Authorizing Provider  albuterol (VENTOLIN HFA) 108 (90 Base) MCG/ACT inhaler Inhale 2 puffs into the lungs every 6 (six) hours as needed for wheezing or shortness of breath.   Yes [provider]  feeding supplement, ENSURE ENLIVE, (ENSURE ENLIVE) LIQD Take 237 mLs by mouth 2 (two) times daily between meals. 05/02/19  Yes Eugenie Filler, MD  furosemide (LASIX) 20 MG tablet Take 1 tablet (20 mg total) by mouth daily. 05/04/19  Yes Eugenie Filler, MD  gabapentin (NEURONTIN) 250 MG/5ML solution Take 300 mg by mouth at bedtime.   Yes [provider]  HYDROmorphone (DILAUDID) 2 MG tablet Take 2 mg by mouth every 4 (four) hours as needed for severe pain.   Yes [provider]  lactulose (CHRONULAC) 10 GM/15ML solution Take 15 mLs (10 g total) by mouth 3 (three) times daily. 05/02/19  Yes Eugenie Filler, MD  loratadine (CLARITIN) 10  MG tablet Take 10 mg by mouth daily.   Yes [provider]  magic mouthwash SOLN Take 5 mLs by mouth.   Yes [provider]  memantine (NAMENDA) 5 MG tablet Take 5 mg by mouth 2 (two) times daily.   Yes [provider]  pantoprazole (PROTONIX) 40 MG tablet Take 40 mg by mouth daily.   Yes [provider]  PARoxetine (PAXIL) 40 MG tablet Take 40 mg by mouth every morning.   Yes [provider]  rifaximin (XIFAXAN) 550 MG TABS tablet Take 550 mg by mouth 2 (two) times daily.   Yes [provider]  sodium bicarbonate 650 MG tablet  Take 1 tablet (650 mg total) by mouth 2 (two) times daily for 20 days. 05/02/19 05/22/19 Yes Eugenie Filler, MD  spironolactone (ALDACTONE) 100 MG tablet Take 1 tablet (100 mg total) by mouth daily. 05/04/19  Yes Eugenie Filler, MD  zolpidem (AMBIEN) 5 MG tablet Take 2.5 mg by mouth at bedtime as needed for sleep.   Yes [provider]  nicotine (NICODERM CQ - DOSED IN MG/24 HOURS) 21 mg/24hr patch Place 1 patch (21 mg total) onto the skin daily. Patient not taking: Reported on 05/09/2019 05/03/19   Eugenie Filler, MD    Allergies    Patient has no known allergies.  Review of Systems   Review of Systems  Unable to perform ROS: Mental status change    Physical Exam Updated Vital Signs BP (!) 153/88   Pulse 83   Temp 98 F (36.7 C) (Oral)   Resp 15   Wt 47 kg   SpO2 100%   BMI 18.97 kg/m   Physical Exam Vitals and nursing note reviewed.  Constitutional:      General: She is not in acute distress.    Appearance: She is well-developed. She is not diaphoretic.  HENT:     Head: Normocephalic and atraumatic.  Eyes:     General: Scleral icterus present.     Extraocular Movements: Extraocular movements intact.     Pupils: Pupils are equal, round, and reactive to light.     Comments: No nystagmus  Cardiovascular:     Rate and Rhythm: Normal rate.  Pulmonary:     Effort: Pulmonary effort is normal.  Abdominal:     General: Bowel sounds are normal.  Musculoskeletal:     Cervical back: Normal range of motion and neck supple.  Skin:    General: Skin is warm and dry.  Neurological:     Comments: Follow simple commands. Moving all 4 extremities, gross sensory exam for all 4 extremities is normal      ED Results / Procedures / Treatments   Labs (all labs ordered are listed, but only abnormal results are displayed) Labs Reviewed  COMPREHENSIVE METABOLIC PANEL - Abnormal; Notable for the following components:      Result Value   CO2 19 (*)    Glucose, Bld  110 (*)    BUN 29 (*)    Creatinine, Ser 1.61 (*)    Albumin 3.3 (*)    Total Bilirubin 1.7 (*)    GFR calc non Af Amer 35 (*)    GFR calc Af Amer 41 (*)    All other components within normal limits  CBC - Abnormal; Notable for the following components:   WBC 2.8 (*)    RBC 2.95 (*)    Hemoglobin 9.8 (*)    HCT 29.8 (*)  MCV 101.0 (*)    RDW 17.2 (*)    Platelets 56 (*)    All other components within normal limits  AMMONIA - Abnormal; Notable for the following components:   Ammonia 106 (*)    All other components within normal limits  RESPIRATORY PANEL BY RT PCR (FLU A&B, COVID)  PROTIME-INR  APTT  CBG MONITORING, ED  I-STAT BETA HCG BLOOD, ED ([facility], [facility], [facility] ONLY)    EKG None  Radiology DG Chest Port 1 View  Result Date: 05/09/2019 CLINICAL DATA:  Altered mental status. EXAM: PORTABLE CHEST 1 VIEW COMPARISON:  April 29, 2019. FINDINGS: The heart size and mediastinal contours are within normal limits. Both lungs are clear. The visualized skeletal structures are unremarkable. IMPRESSION: No active disease. Electronically Signed   By: Marijo Conception M.D.   On: 05/09/2019 10:46    Procedures .Critical Care Performed by: Varney Biles, MD Authorized by: Varney Biles, MD   Critical care provider statement:    Critical care time (minutes):  52   Critical care was necessary to treat or prevent imminent or life-threatening deterioration of the following conditions:  Metabolic crisis and hepatic failure   Critical care was time spent personally by me on the following activities:  Discussions with consultants, evaluation of patient's response to treatment, examination of patient, ordering and performing treatments and interventions, ordering and review of laboratory studies, ordering and review of radiographic studies, pulse oximetry, re-evaluation of patient's condition, obtaining history from patient or surrogate and review of old charts Ultrasound ED Peripheral IV (Provider)   Date/Time: 05/09/2019 11:27 AM Performed by: Varney Biles, MD Authorized by: Varney Biles, MD   Procedure details:    Indications: multiple failed IV attempts     Skin Prep: isopropyl alcohol     Location:  Right AC   Angiocath:  20 G   Bedside Ultrasound Guided: Yes     Images: not archived     Patient tolerated procedure without complications: Yes     Dressing applied: Yes     (including critical care time)  Medications Ordered in ED Medications  sodium chloride flush (NS) 0.9 % injection 3 mL (3 mLs Intravenous Not Given 05/09/19 1050)  rifaximin (XIFAXAN) tablet 550 mg (has no administration in time range)  memantine (NAMENDA) tablet 5 mg (has no administration in time range)  PARoxetine (PAXIL) tablet 40 mg (has no administration in time range)  lactulose (CHRONULAC) 10 GM/15ML solution 30 g (has no administration in time range)  pantoprazole (PROTONIX) EC tablet 40 mg (has no administration in time range)  sodium bicarbonate tablet 650 mg (has no administration in time range)  albuterol (VENTOLIN HFA) 108 (90 Base) MCG/ACT inhaler 2 puff (has no administration in time range)  loratadine (CLARITIN) tablet 10 mg (has no administration in time range)  acetaminophen (TYLENOL) tablet 650 mg (has no administration in time range)    Or  acetaminophen (TYLENOL) suppository 650 mg (has no administration in time range)  ondansetron (ZOFRAN) tablet 4 mg (has no administration in time range)    Or  ondansetron (ZOFRAN) injection 4 mg (has no administration in time range)  lactulose (CHRONULAC) enema 200 gm (has no administration in time range)    ED Course  I have reviewed the triage vital signs and the nursing notes.  Pertinent labs & imaging results that were available during my care of the patient were reviewed by me and considered in my medical decision making (see chart for details).  MDM Rules/Calculators/A&P                      59 year old female with history  of ESLD with liver cirrhosis, CKD, COPD comes in a chief complaint of altered mental status.  Patient appears to be having hepatic encephalopathy.  Ammonia level is elevated.  Lactulose ordered.  Patient will be admitted for optimization.  Additionally we considered UTI, peritonitis/SBP, brain bleed in the differential diagnosis.  Patient is following simple commands, moving all 4 extremities therefore stroke is lower in the differential.  No fevers, chills.  Patient had received a COVID-19 vaccine 4 days ago, possible that there could be residual side effects.    Final Clinical Impression(s) / ED Diagnoses Final diagnoses:  Acute hepatic encephalopathy  Thrombocytopenia ([place])    Rx / DC Orders ED Discharge Orders    None       Varney Biles, MD 05/09/19 1129

## 2019-05-09 NOTE — ED Notes (Signed)
ED Provider at bedside. 

## 2019-05-09 NOTE — Progress Notes (Signed)
Patient refused her afternoon lactulose, Dr Starla Link notified.

## 2019-05-09 NOTE — Progress Notes (Signed)
Received report from [place], [place] in ED.  Awaiting patient's arrival to Room 1604.

## 2019-05-09 NOTE — ED Triage Notes (Signed)
BIB EMS, AMS  Stated by family, pt unable to follow commands, elevated ammonia levels in past resulting in AMS. Pt in [place]. 168/100-77-96% RA -  CBG 131

## 2019-05-09 NOTE — H&P (Signed)
History and Physical    Judy Meza LFY:[id number] DOB: 1960/01/16 DOA: 05/09/2019  PCP: Patient, No Pcp Per   Patient coming from: Home  I have personally briefly reviewed patient's old medical records in [place]  Chief Complaint: Altered mental status  HPI: Judy Meza is a 59 y.o. female with medical history significant of COPD, throat cancer status post tracheostomy, cirrhosis of liver, chronic kidney disease stage III, hypertension, alcohol abuse in remission, tobacco abuse, history of IVDU in remission, hepatitis C, depression, dementia recent hospitalization from 04/29/2019-05/02/2019 for hepatic encephalopathy which was treated with lactulose and rifaximin and was subsequently discharged home presented with altered mental status.  Patient is currently drowsy and cannot provide accurate history.  Sister is at bedside who states that patient was doing well until 2 days ago when she started getting sluggish.  Yesterday she started defecating on herself.  This morning, she looked worse and hence her sister brought her to the hospital.  No loss of consciousness or seizures reported.  No fevers or vomiting reported.  Rest of the history could not be obtained.  ED Course: She was found to have ammonia of 106. Hospitalist service was called to evaluate the patient.  Review of Systems: Could not be obtained because patient is drowsy  Past Medical History:  Diagnosis Date  . Cancer ([place])   . COPD (chronic obstructive pulmonary disease) ([place])   . Hypertension   . Liver disease   . Renal disorder     Past Surgical History:  Procedure Laterality Date  . PORT-A-CATH REMOVAL     Social history  reports that she has been smoking cigarettes. She has been smoking about 0.25 packs per day. She has never used smokeless tobacco. She reports previous alcohol use. She reports previous drug use.  No Known Allergies  Family History  Problem Relation Age of Onset  . Healthy Mother     . Hypertension Other     Prior to Admission medications   Medication Sig Start Date End Date Taking? Authorizing Provider  albuterol (VENTOLIN HFA) 108 (90 Base) MCG/ACT inhaler Inhale 2 puffs into the lungs every 6 (six) hours as needed for wheezing or shortness of breath.   Yes [provider]  feeding supplement, ENSURE ENLIVE, (ENSURE ENLIVE) LIQD Take 237 mLs by mouth 2 (two) times daily between meals. 05/02/19  Yes Eugenie Filler, MD  furosemide (LASIX) 20 MG tablet Take 1 tablet (20 mg total) by mouth daily. 05/04/19  Yes Eugenie Filler, MD  gabapentin (NEURONTIN) 250 MG/5ML solution Take 300 mg by mouth at bedtime.   Yes [provider]  HYDROmorphone (DILAUDID) 2 MG tablet Take 2 mg by mouth every 4 (four) hours as needed for severe pain.   Yes [provider]  lactulose (CHRONULAC) 10 GM/15ML solution Take 15 mLs (10 g total) by mouth 3 (three) times daily. 05/02/19  Yes Eugenie Filler, MD  loratadine (CLARITIN) 10 MG tablet Take 10 mg by mouth daily.   Yes [provider]  magic mouthwash SOLN Take 5 mLs by mouth.   Yes [provider]  memantine (NAMENDA) 5 MG tablet Take 5 mg by mouth 2 (two) times daily.   Yes [provider]  pantoprazole (PROTONIX) 40 MG tablet Take 40 mg by mouth daily.   Yes [provider]  PARoxetine (PAXIL) 40 MG tablet Take 40 mg by mouth every morning.   Yes [provider]  rifaximin (XIFAXAN) 550 MG TABS tablet  Take 550 mg by mouth 2 (two) times daily.   Yes [provider]  sodium bicarbonate 650 MG tablet Take 1 tablet (650 mg total) by mouth 2 (two) times daily for 20 days. 05/02/19 05/22/19 Yes Eugenie Filler, MD  spironolactone (ALDACTONE) 100 MG tablet Take 1 tablet (100 mg total) by mouth daily. 05/04/19  Yes Eugenie Filler, MD  zolpidem (AMBIEN) 5 MG tablet Take 2.5 mg by mouth at bedtime as needed for sleep.   Yes [provider]  nicotine  (NICODERM CQ - DOSED IN MG/24 HOURS) 21 mg/24hr patch Place 1 patch (21 mg total) onto the skin daily. Patient not taking: Reported on 05/09/2019 05/03/19   Eugenie Filler, MD    Physical Exam: Vitals:   05/09/19 0842 05/09/19 0846 05/09/19 0945  BP:  (!) 154/99 (!) 159/108  Pulse:  77 78  Resp:  16 14  Temp:  98 F (36.7 C)   TempSrc:  Oral   SpO2:  100% 100%  Weight: 47 kg      Constitutional: No acute distress.  Looks older than stated age.  Looks chronically ill. Vitals:   05/09/19 0842 05/09/19 0846 05/09/19 0945  BP:  (!) 154/99 (!) 159/108  Pulse:  77 78  Resp:  16 14  Temp:  98 F (36.7 C)   TempSrc:  Oral   SpO2:  100% 100%  Weight: 47 kg     Eyes: PERRL, lids and conjunctivae normal.   ENMT: Mucous membranes are dry. Posterior pharynx clear of any exudate or lesions. Neck: normal, supple, no masses, no thyromegaly.  Tracheostomy site dressing present Respiratory: bilateral decreased breath sounds at bases, no wheezing; some scattered crackles normal respiratory effort. No accessory muscle use.  Cardiovascular: S1 S2 positive, rate controlled.  Trace lower extremity edema. 2+ pedal pulses.  Abdomen: Slightly distended, no tenderness, no masses palpated. No hepatosplenomegaly. Bowel sounds positive.  Musculoskeletal: no clubbing / cyanosis. No joint deformity upper and lower extremities.  Skin: no rashes, lesions, ulcers. No induration Neurologic: Moving extremities.  Drowsy, opens her eyes slightly, hardly follows any commands.  No obvious focal neurologic deficits Psychiatric: Could not be assessed because of mental status   Labs on Admission: I have personally reviewed following labs and imaging studies  CBC: Recent Labs  Lab 05/09/19 0930  WBC 2.8*  HGB 9.8*  HCT 29.8*  MCV 101.0*  PLT 56*   Basic Metabolic Panel: Recent Labs  Lab 05/09/19 0930  NA 137  K 4.6  CL 109  CO2 19*  GLUCOSE 110*  BUN 29*  CREATININE 1.61*  CALCIUM 9.3    GFR: Estimated Creatinine Clearance: 28.9 mL/min (A) (by C-G formula based on SCr of 1.61 mg/dL (H)). Liver Function Tests: Recent Labs  Lab 05/09/19 0930  AST 41  ALT 26  ALKPHOS 93  BILITOT 1.7*  PROT 7.7  ALBUMIN 3.3*   No results for input(s): LIPASE, AMYLASE in the last 168 hours. Recent Labs  Lab 05/09/19 0930  AMMONIA 106*   Coagulation Profile: No results for input(s): INR, PROTIME in the last 168 hours. Cardiac Enzymes: No results for input(s): CKTOTAL, CKMB, CKMBINDEX, TROPONINI in the last 168 hours. BNP (last 3 results) No results for input(s): PROBNP in the last 8760 hours. HbA1C: No results for input(s): HGBA1C in the last 72 hours. CBG: Recent Labs  Lab 05/09/19 0849  GLUCAP 92   Lipid Profile: No results for input(s): CHOL, HDL, LDLCALC, TRIG, CHOLHDL, LDLDIRECT in the  last 72 hours. Thyroid Function Tests: No results for input(s): TSH, T4TOTAL, FREET4, T3FREE, THYROIDAB in the last 72 hours. Anemia Panel: No results for input(s): VITAMINB12, FOLATE, FERRITIN, TIBC, IRON, RETICCTPCT in the last 72 hours. Urine analysis: No results found for: COLORURINE, APPEARANCEUR, LABSPEC, PHURINE, GLUCOSEU, HGBUR, BILIRUBINUR, KETONESUR, PROTEINUR, UROBILINOGEN, NITRITE, LEUKOCYTESUR  Radiological Exams on Admission: No results found.  Assessment/Plan  Hepatic encephalopathy Decompensated alcoholic cirrhosis of liver History of hepatitis C, untreated Pancytopenia -Patient was recently hospitalized and discharged on 05/02/2019 for hepatic encephalopathy treated with lactulose and rifaximin.  Presents again with altered mental status with ammonia of 106 -Currently very drowsy.  We will try oral lactulose; if unable to take orally, will have to give rectally.  Continue rifaximin. -No signs of ascites at this time.  Had abdominal ultrasound during recent admission which did not show any ascites. -No current evidence of any infection. -We will hold  spironolactone and Lasix. -Strict input and output.  Daily weights.  Fluid restriction. -Palliative care consult for goals of care discussion given recurrent admission and overall poor prognosis -No current signs of bleeding.  Will monitor CBC.   -We will need outpatient GI evaluation -Hold sedatives.  Acute kidney injury on chronic kidney disease stage III -Baseline creatinine around 1.2.  Presented with creatinine of 1.61.  Hold spironolactone and Lasix.  Gentle hydration normal saline at 75 cc an hour for 1 L. -Monitor creatinine  History of COPD -Stable.  Use inhalers as needed  Tobacco abuse -Patient will need counseling again regarding tobacco cessation once more awake  History of throat cancer status post radiation -As per the sister, patient decannulated 3 to 4 weeks ago.  Will need to establish care with ENT and oncology in the outpatient setting  History of alcohol abuse History of IVDU -Currently in remission  DVT prophylaxis: SCDs.  Avoid Lovenox because of thrombocytopenia Code Status: DNR Family Communication: Sister at bedside Disposition Plan: Home in 2 to 3 days once clinically improves Consults called: Palliative care Admission status: Inpatient/MedSurg  Severity of Illness: The appropriate patient status for this patient is INPATIENT. Inpatient status is judged to be reasonable and necessary in order to provide the required intensity of service to ensure the patient's safety. The patient's presenting symptoms, physical exam findings, and initial radiographic and laboratory data in the context of their chronic comorbidities is felt to place them at high risk for further clinical deterioration. Furthermore, it is not anticipated that the patient will be medically stable for discharge from the hospital within 2 midnights of admission. The following factors support the patient status of inpatient.   " The patient's presenting symptoms include altered mental  status. " The worrisome physical exam findings include drowsy/confused. " The initial radiographic and laboratory data are worrisome because of elevated ammonia/acute kidney injury. " The chronic co-morbidities include chronic kidney disease stage III/cirrhosis of liver/hepatic encephalopathy/history of throat cancer.   * I certify that at the point of admission it is my clinical judgment that the patient will require inpatient hospital care spanning beyond 2 midnights from the point of admission due to high intensity of service, high risk for further deterioration and high frequency of surveillance required.Aline August MD [facility]  05/09/2019, 10:43 AM

## 2019-05-10 DIAGNOSIS — B182 Chronic viral hepatitis C: Secondary | ICD-10-CM | POA: Diagnosis present

## 2019-05-10 DIAGNOSIS — K7200 Acute and subacute hepatic failure without coma: Secondary | ICD-10-CM

## 2019-05-10 DIAGNOSIS — F0390 Unspecified dementia without behavioral disturbance: Secondary | ICD-10-CM | POA: Diagnosis present

## 2019-05-10 DIAGNOSIS — E872 Acidosis, unspecified: Secondary | ICD-10-CM | POA: Diagnosis present

## 2019-05-10 DIAGNOSIS — Z7189 Other specified counseling: Secondary | ICD-10-CM

## 2019-05-10 DIAGNOSIS — R531 Weakness: Secondary | ICD-10-CM

## 2019-05-10 DIAGNOSIS — D696 Thrombocytopenia, unspecified: Secondary | ICD-10-CM

## 2019-05-10 DIAGNOSIS — N179 Acute kidney failure, unspecified: Secondary | ICD-10-CM

## 2019-05-10 DIAGNOSIS — Z515 Encounter for palliative care: Secondary | ICD-10-CM

## 2019-05-10 LAB — BASIC METABOLIC PANEL
Anion gap: 7 (ref 5–15)
BUN: 22 mg/dL — ABNORMAL HIGH (ref 6–20)
CO2: 17 mmol/L — ABNORMAL LOW (ref 22–32)
Calcium: 8.8 mg/dL — ABNORMAL LOW (ref 8.9–10.3)
Chloride: 111 mmol/L (ref 98–111)
Creatinine, Ser: 1.4 mg/dL — ABNORMAL HIGH (ref 0.44–1.00)
GFR calc Af Amer: 49 mL/min — ABNORMAL LOW (ref >60)
GFR calc non Af Amer: 42 mL/min — ABNORMAL LOW (ref >60)
Glucose, Bld: 113 mg/dL — ABNORMAL HIGH (ref 70–99)
Potassium: 4.7 mmol/L (ref 3.5–5.1)
Sodium: 135 mmol/L (ref 135–145)

## 2019-05-10 LAB — CBC
HCT: 29 % — ABNORMAL LOW (ref 36.0–46.0)
Hemoglobin: 9.5 g/dL — ABNORMAL LOW (ref 12.0–15.0)
MCH: 33.1 pg (ref 26.0–34.0)
MCHC: 32.8 g/dL (ref 30.0–36.0)
MCV: 101 fL — ABNORMAL HIGH (ref 80.0–100.0)
Platelets: 73 10*3/uL — ABNORMAL LOW (ref 150–400)
RBC: 2.87 MIL/uL — ABNORMAL LOW (ref 3.87–5.11)
RDW: 17.2 % — ABNORMAL HIGH (ref 11.5–15.5)
WBC: 3.8 10*3/uL — ABNORMAL LOW (ref 4.0–10.5)
nRBC: 0 % (ref 0.0–0.2)

## 2019-05-10 MED ORDER — ZOLPIDEM TARTRATE 5 MG PO TABS
2.5000 mg | ORAL_TABLET | Freq: Every evening | ORAL | Status: DC | PRN
  Administered 2019-05-10: 21:00:00 2.5 mg via ORAL
  Filled 2019-05-10: qty 1

## 2019-05-10 MED ORDER — HYDROXYZINE HCL 10 MG PO TABS
10.0000 mg | ORAL_TABLET | Freq: Three times a day (TID) | ORAL | Status: AC | PRN
  Administered 2019-05-10: 04:00:00 10 mg via ORAL
  Filled 2019-05-10: qty 1

## 2019-05-10 MED ORDER — MAGIC MOUTHWASH
2.0000 mL | Freq: Three times a day (TID) | ORAL | Status: DC | PRN
  Administered 2019-05-10 – 2019-05-11 (×5): 2 mL via ORAL
  Filled 2019-05-10 (×6): qty 5
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
## 2019-05-10 NOTE — Progress Notes (Signed)
[facility]  PROGRESS NOTE  Judy Meza WLN:[id number] DOB: July 04, 1960 DOA: 05/09/2019 PCP: Patient, No Pcp Per Admit date - 05/09/2019   Admitting Physician Aline August, MD  Outpatient Primary MD for the patient is Patient, No Pcp Per  LOS - 1 Brief Narrative   Judy Meza is a 59 y.o. year old female with medical history significant for COPD, throat cancer status post tracheostomy, cirrhosis of liver, chronic kidney disease stage III, hypertension, alcohol abuse in remission, tobacco abuse, history of IVDU in remission, hepatitis C, depression, dementia recent hospitalization from 04/29/2019-05/02/2019 for hepatic encephalopathy which was treated with lactulose and rifaximin  who presented on 05/09/2019 with reports of increased confusion, inability to follow commands as found to have elevated ammonia of 103 in the ED concern for hepatic encephalopathy  Sister noticed worsening confusion, with shaking and flapping on Saturday. She reports after recent discharge her confusion had greatly improved from that point. She is having BMs at home but not sure how much.  Her sister found her in the bathroom and she had defecated on herself but was   Sister giving 15 mL twice a day because of diarrhea for about at a month and a half(stool water) and concern for dehydration. In a given day she has BM 2-3 times a day.     Subjective  Today has no acute complaints.  No acute events overnight.  Able to converse this morning.  Asking for Ambien at nighttime.  A & P   Decompensated cirrhosis with hepatic encephalopathy, improving.  Ammonia 106 on admission.  Mental status is much better today able to converse, still has asterixis on exam.  Patient has been on lactulose 15 mg twice daily per family for about a month have difficulty determining best dosage for patient to avoid diarrhea and stabilize mental status at home, no signs of infection, no ascites on exam.  Secondary Some of  presentation. -Continue lactulose 30 mg 3 times daily (increased from home dose to 15 mg twice daily) and rifaximin -Fluid restriction, daily outs, strict I's and O's -Appreciate palliative care recommendations, home with palliative services on discharge, DNR DNI -Needs to establish with GI as outpatient  AKI on CKD stage III.  Baseline creatinine around 1.2-1.3.  1.6 on admission.  Currently improving at 1.4 responded IV fluids (5 cc an hour for 1 L on admission) -Avoid nephrotoxins: Holding home spironolactone and Lasix -Monitor BMP  Metabolic acidosis, chronic.  Likely multifactorial etiology related to GI/diarrhea from lactulose and CKD -continue sodium bicarb -monitor BMP  HTN, stable blood pressure currently at goal -Continue close monitor  Macrocytic anemia, chronic.  Stable at baseline -Monitor CBC  Chronic thrombocytopenia in setting of cirrhosis, stable at baseline.  Platelet around 50-60 no signs or symptoms of bleeding. -Daily CBC  Throat cancer status post tracheostomy.  Patient was recently decannulated, -Needs to establish ENT as outpatient  Dementia without behavioral disturbances. -Continue Namenda, delirium precautions  COPD.  Stable on room air without wheezing -As needed inhalers  Alcohol abuse/IV drug use in remission  Tobacco history. She is still smoking. Per sister about a pack a week -tobacco cessation encouraged  History of hepatitis C. Not treated. HCV RNA present. --will discuss with ID/GI will likely need outpatient  Depression, stable.  - continue paroxetine   GERD, stable -Continue PPI  Family Communication  : Discussed with sister Judy Meza at [phone]  Code Status :  DNR DNI  Disposition Plan  :  Patient is from home. Anticipated  d/c date:  24-48 hours. Barriers to d/c or necessity for inpatient status:  Continued improvement in mental status while determining best lactulose/rifaximin dosing, close monitoring kidney  function Consults  : Palliative  Procedures  : None  DVT Prophylaxis  : SCDs  Lab Results  Component Value Date   PLT 73 (L) 05/10/2019    Diet :  Diet Order            Diet regular Room service appropriate? Yes; Fluid consistency: Thin  Diet effective now               Inpatient Medications Scheduled Meds: . lactulose  30 g Oral TID  . loratadine  10 mg Oral Daily  . memantine  5 mg Oral BID  . pantoprazole  40 mg Oral Daily  . PARoxetine  40 mg Oral Daily  . rifaximin  550 mg Oral BID  . sodium bicarbonate  650 mg Oral BID  . sodium chloride flush  3 mL Intravenous Once   Continuous Infusions: PRN Meds:.acetaminophen **OR** acetaminophen, albuterol, magic mouthwash, melatonin, ondansetron **OR** ondansetron (ZOFRAN) IV  Antibiotics  :   Anti-infectives (From admission, onward)   Start     Dose/Rate Route Frequency Ordered Stop   05/09/19 1200  rifaximin (XIFAXAN) tablet 550 mg     550 mg Oral 2 times daily 05/09/19 1042         Objective   Vitals:   05/09/19 2146 05/09/19 2334 05/10/19 0640 05/10/19 0830  BP: 123/86 (!) 139/92 134/87   Pulse: 81 87 96 (!) 103  Resp: 16 17 17 17   Temp: (!) 97.5 F (36.4 C) 97.8 F (36.6 C) 98.1 F (36.7 C)   TempSrc: Oral Oral Oral   SpO2: 99% 99% 97% 98%  Weight:        SpO2: 98 %  Wt Readings from Last 3 Encounters:  05/09/19 47 kg  04/29/19 47.6 kg    No intake or output data in the 24 hours ending 05/10/19 1639  Physical Exam:    Thin female who looks older than stated age, no distress Awake Alert, Oriented X 3, Normal affect Speaking in soft voice Open trach stoma, with dried drainage around, no erythema or purulent drainage present No new F.N deficits,  Martinez.AT, Normal respiratory effort on room air, CTAB RRR,No Gallops,Rubs or new Murmurs,  +ve B.Sounds, Abd Soft, no ascites Positive for mild asterixis  No Cyanosis, No new Rash or bruise    I have personally reviewed the following:   Data  Reviewed:  CBC Recent Labs  Lab 05/09/19 0930 05/10/19 0918  WBC 2.8* 3.8*  HGB 9.8* 9.5*  HCT 29.8* 29.0*  PLT 56* 73*  MCV 101.0* 101.0*  MCH 33.2 33.1  MCHC 32.9 32.8  RDW 17.2* 17.2*    Chemistries  Recent Labs  Lab 05/09/19 0930 05/10/19 0918  NA 137 135  K 4.6 4.7  CL 109 111  CO2 19* 17*  GLUCOSE 110* 113*  BUN 29* 22*  CREATININE 1.61* 1.40*  CALCIUM 9.3 8.8*  AST 41  --   ALT 26  --   ALKPHOS 93  --   BILITOT 1.7*  --    ------------------------------------------------------------------------------------------------------------------ No results for input(s): CHOL, HDL, LDLCALC, TRIG, CHOLHDL, LDLDIRECT in the last 72 hours.  No results found for: HGBA1C ------------------------------------------------------------------------------------------------------------------ No results for input(s): TSH, T4TOTAL, T3FREE, THYROIDAB in the last 72 hours.  Invalid input(s): FREET3 ------------------------------------------------------------------------------------------------------------------ No results for input(s): VITAMINB12, FOLATE, FERRITIN, TIBC, IRON,  RETICCTPCT in the last 72 hours.  Coagulation profile Recent Labs  Lab 05/09/19 0930  INR 1.2    No results for input(s): DDIMER in the last 72 hours.  Cardiac Enzymes No results for input(s): CKMB, TROPONINI, MYOGLOBIN in the last 168 hours.  Invalid input(s): CK ------------------------------------------------------------------------------------------------------------------    Component Value Date/Time   BNP 21.4 04/30/2019 0553    Micro Results Recent Results (from the past 240 hour(s))  Respiratory Panel by RT PCR (Flu A&B, Covid) - Nasopharyngeal Swab     Status: None   Collection Time: 05/09/19  9:30 AM   Specimen: Nasopharyngeal Swab  Result Value Ref Range Status   SARS Coronavirus 2 by RT PCR NEGATIVE NEGATIVE Final    Comment: (NOTE) SARS-CoV-2 target nucleic acids are NOT  DETECTED. The SARS-CoV-2 RNA is generally detectable in upper respiratoy specimens during the acute phase of infection. The lowest concentration of SARS-CoV-2 viral copies this assay can detect is 131 copies/mL. A negative result does not preclude SARS-Cov-2 infection and should not be used as the sole basis for treatment or other patient management decisions. A negative result may occur with  improper specimen collection/handling, submission of specimen other than nasopharyngeal swab, presence of viral mutation(s) within the areas targeted by this assay, and inadequate number of viral copies (<131 copies/mL). A negative result must be combined with clinical observations, patient history, and epidemiological information. The expected result is Negative. Fact Sheet for Patients:  PinkCheek.be Fact Sheet for Healthcare Providers:  GravelBags.it This test is not yet ap proved or cleared by the [place] FDA and  has been authorized for detection and/or diagnosis of SARS-CoV-2 by FDA under an Emergency Use Authorization (EUA). This EUA will remain  in effect (meaning this test can be used) for the duration of the COVID-19 declaration under Section 564(b)(1) of the Act, 21 U.S.C. section 360bbb-3(b)(1), unless the authorization is terminated or revoked sooner.    Influenza A by PCR NEGATIVE NEGATIVE Final   Influenza B by PCR NEGATIVE NEGATIVE Final    Comment: (NOTE) The Xpert Xpress SARS-CoV-2/FLU/RSV assay is intended as an aid in  the diagnosis of influenza from Nasopharyngeal swab specimens and  should not be used as a sole basis for treatment. Nasal washings and  aspirates are unacceptable for Xpert Xpress SARS-CoV-2/FLU/RSV  testing. Fact Sheet for Patients: PinkCheek.be Fact Sheet for Healthcare Providers: GravelBags.it This test is not yet approved or cleared  by the [place] FDA and  has been authorized for detection and/or diagnosis of SARS-CoV-2 by  FDA under an Emergency Use Authorization (EUA). This EUA will remain  in effect (meaning this test can be used) for the duration of the  Covid-19 declaration under Section 564(b)(1) of the Act, 21  U.S.C. section 360bbb-3(b)(1), unless the authorization is  terminated or revoked. Performed at [facility], [place] [address], [place], [place] [address]     Radiology Reports CT Head Wo Contrast  Result Date: 04/29/2019 CLINICAL DATA:  Altered mental status. Decreased movement of RIGHT arm and leg. EXAM: CT HEAD WITHOUT CONTRAST TECHNIQUE: Contiguous axial images were obtained from the base of the skull through the vertex without intravenous contrast. COMPARISON:  None. FINDINGS: Brain: Ventricles are normal in size. Patchy chronic small vessel ischemic changes are seen within the bilateral periventricular and subcortical white matter. There is a small old lacunar infarct within the LEFT basal ganglia. No mass, hemorrhage or other evidence of acute parenchymal abnormality. No extra-axial hemorrhage. Vascular: Chronic calcified  atherosclerotic changes of the large vessels at the skull base. No unexpected hyperdense vessel. Skull: Normal. Negative for fracture or focal lesion. Sinuses/Orbits: No acute finding. Other: None. IMPRESSION: 1. No acute findings.  No intracranial mass, hemorrhage or edema. 2. Chronic small vessel ischemic changes within the white matter and basal ganglia. Electronically Signed   By: Franki Cabot M.D.   On: 04/29/2019 15:05   US Abdomen Complete  Result Date: 04/30/2019 CLINICAL DATA:  Cirrhosis, acute renal insufficiency, hepatic encephalopathy EXAM: ABDOMEN ULTRASOUND COMPLETE COMPARISON:  None. FINDINGS: Gallbladder: Multiple shadowing gallstones without cholecystitis. Largest calculus measures approximately 6 mm. No gallbladder wall thickening or  pericholecystic fluid. Common bile duct: Diameter: 7 mm Liver: Heterogeneous echotexture with nodularity of the liver capsule compatible with cirrhosis. No focal abnormality. Portal vein is patent on color Doppler imaging with normal direction of blood flow towards the liver. IVC: No abnormality visualized. Pancreas: Visualized portion unremarkable. Spleen: Size and appearance within normal limits. Right Kidney: Length: 7.6 cm. Echogenicity within normal limits. No mass or hydronephrosis visualized. Left Kidney: Length: 10.0 cm. Echogenicity within normal limits. No mass or hydronephrosis visualized. Abdominal aorta: Atheromatous plaque throughout the aorta without evidence of aneurysm. Other findings: None. IMPRESSION: 1. Heterogeneous liver echotexture with nodular capsule compatible with cirrhosis. 2. Cholelithiasis without cholecystitis. Electronically Signed   By: Randa Ngo M.D.   On: 04/30/2019 00:36   DG Chest Port 1 View  Result Date: 05/09/2019 CLINICAL DATA:  Altered mental status. EXAM: PORTABLE CHEST 1 VIEW COMPARISON:  April 29, 2019. FINDINGS: The heart size and mediastinal contours are within normal limits. Both lungs are clear. The visualized skeletal structures are unremarkable. IMPRESSION: No active disease. Electronically Signed   By: Marijo Conception M.D.   On: 05/09/2019 10:46   DG Chest Port 1 View  Result Date: 04/29/2019 CLINICAL DATA:  Altered mental status with decreased movement in the right arm and leg. EXAM: PORTABLE CHEST 1 VIEW COMPARISON:  None. FINDINGS: The heart size and mediastinal contours are within normal limits. Both lungs are clear. The visualized skeletal structures are unremarkable. IMPRESSION: Normal examination. Electronically Signed   By: Claudie Revering M.D.   On: 04/29/2019 14:51     Time Spent in minutes  30     Desiree Hane M.D on 05/10/2019 at 4:39 PM  To page go to [email or web address] - password [facility]

## 2019-05-10 NOTE — Evaluation (Signed)
Physical Therapy Evaluation Patient Details Name: Judy Meza MRN: [id number] DOB: Nov 30, 1960 Today's Date: 05/10/2019   History of Present Illness  59 y.o. female with medical history significant of COPD, throat cancer status post tracheostomy, cirrhosis of liver, chronic kidney disease stage III, hypertension, alcohol abuse in remission, tobacco abuse, history of IVDU in remission, hepatitis C, depression, dementia recent hospitalization from 04/29/2019-05/02/2019 for hepatic encephalopathy which was treated with lactulose and rifaximin and was subsequently discharged home presented with altered mental status due to hepatic encephalopathy  Clinical Impression  Pt admitted with above diagnosis.  Pt currently with functional limitations due to the deficits listed below (see PT Problem List). Pt will benefit from skilled PT to increase their independence and safety with mobility to allow discharge to the venue listed below.  Pt currently min/guard with mobility for safety.  Pt reports her sister and mother are with her at home.  Pt states, "tell the doctor I can walk."       Follow Up Recommendations No PT follow up    Equipment Recommendations  None recommended by PT    Recommendations for Other Services       Precautions / Restrictions Precautions Precautions: Fall      Mobility  Bed Mobility Overal bed mobility: Modified Independent                Transfers Overall transfer level: Needs assistance Equipment used: None Transfers: Sit to/from Stand Sit to Stand: Min guard         General transfer comment: close guard for safety  Ambulation/Gait Ambulation/Gait assistance: Min guard Gait Distance (Feet): 100 Feet Assistive device: None Gait Pattern/deviations: Narrow base of support;Decreased stride length     General Gait Details: easily distracted, cues to remain on task, min/guard for occasional unsteadiness however no overt LOB  Stairs             Wheelchair Mobility    Modified Rankin (Stroke Patients Only)       Balance             Standing balance-Leahy Scale: Fair                               Pertinent Vitals/Pain Pain Assessment: No/denies pain    Home Living Family/patient expects to be discharged to:: Private residence Living Arrangements: Other relatives(sister, mother)   Type of Home: Apartment Home Access: Stairs to enter Entrance Stairs-Rails: Right Entrance Stairs-Number of Steps: flight Home Layout: One level Home Equipment: Environmental consultant - 2 wheels;Cane - single point Additional Comments: pt may still be a little confused; above per recent admission, pt denies having stairs    Prior Function Level of Independence: Independent               Hand Dominance        Extremity/Trunk Assessment        Lower Extremity Assessment Lower Extremity Assessment: Generalized weakness    Cervical / Trunk Assessment Cervical / Trunk Assessment: Normal  Communication   Communication: Tracheostomy  Cognition Arousal/Alertness: Awake/alert Behavior During Therapy: WFL for tasks assessed/performed Overall Cognitive Status: Impaired/Different from baseline                                 General Comments: pt with appropriate answers to questions during session however appears with confusion at times (ie pt handed her mask and  she tried to put it on top of her head)      General Comments      Exercises     Assessment/Plan    PT Assessment Patient needs continued PT services  PT Problem List Decreased activity tolerance;Decreased balance;Decreased mobility       PT Treatment Interventions DME instruction;Gait training;Stair training;Functional mobility training;Therapeutic activities;Therapeutic exercise;Patient/family education    PT Goals (Current goals can be found in the Care Plan section)  Acute Rehab PT Goals PT Goal Formulation: With patient Time For Goal  Achievement: 05/24/19 Potential to Achieve Goals: Good    Frequency Min 3X/week   Barriers to discharge        Co-evaluation               AM-PAC PT "6 Clicks" Mobility  Outcome Measure Help needed turning from your back to your side while in a flat bed without using bedrails?: None Help needed moving from lying on your back to sitting on the side of a flat bed without using bedrails?: None Help needed moving to and from a bed to a chair (including a wheelchair)?: A Little Help needed standing up from a chair using your arms (e.g., wheelchair or bedside chair)?: A Little Help needed to walk in hospital room?: A Little Help needed climbing 3-5 steps with a railing? : A Little 6 Click Score: 20    End of Session   Activity Tolerance: Patient tolerated treatment well Patient left: in bed;with call bell/phone within reach;with bed alarm set;with nursing/sitter in room Nurse Communication: Mobility status PT Visit Diagnosis: Unsteadiness on feet (R26.81);Difficulty in walking, not elsewhere classified (R26.2)    Time: 3009-2330 PT Time Calculation (min) (ACUTE ONLY): 18 min   Charges:   PT Evaluation $PT Eval Low Complexity: 1 Low        Kati PT, DPT Acute Rehabilitation Services Office: [phone]   Trena Platt 05/10/2019, 3:45 PM
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
## 2019-05-11 ENCOUNTER — Other Ambulatory Visit: Payer: Self-pay

## 2019-05-11 DIAGNOSIS — B182 Chronic viral hepatitis C: Secondary | ICD-10-CM

## 2019-05-11 DIAGNOSIS — K7030 Alcoholic cirrhosis of liver without ascites: Secondary | ICD-10-CM

## 2019-05-11 LAB — BASIC METABOLIC PANEL
Anion gap: 7 (ref 5–15)
BUN: 19 mg/dL (ref 6–20)
CO2: 18 mmol/L — ABNORMAL LOW (ref 22–32)
Calcium: 8.9 mg/dL (ref 8.9–10.3)
Chloride: 112 mmol/L — ABNORMAL HIGH (ref 98–111)
Creatinine, Ser: 1.26 mg/dL — ABNORMAL HIGH (ref 0.44–1.00)
GFR calc Af Amer: 55 mL/min — ABNORMAL LOW (ref >60)
GFR calc non Af Amer: 48 mL/min — ABNORMAL LOW (ref >60)
Glucose, Bld: 112 mg/dL — ABNORMAL HIGH (ref 70–99)
Potassium: 4 mmol/L (ref 3.5–5.1)
Sodium: 137 mmol/L (ref 135–145)

## 2019-05-11 LAB — CBC
HCT: 29.6 % — ABNORMAL LOW (ref 36.0–46.0)
Hemoglobin: 9.7 g/dL — ABNORMAL LOW (ref 12.0–15.0)
MCH: 33.7 pg (ref 26.0–34.0)
MCHC: 32.8 g/dL (ref 30.0–36.0)
MCV: 102.8 fL — ABNORMAL HIGH (ref 80.0–100.0)
Platelets: 60 10*3/uL — ABNORMAL LOW (ref 150–400)
RBC: 2.88 MIL/uL — ABNORMAL LOW (ref 3.87–5.11)
RDW: 17.2 % — ABNORMAL HIGH (ref 11.5–15.5)
WBC: 3.2 10*3/uL — ABNORMAL LOW (ref 4.0–10.5)
nRBC: 0 % (ref 0.0–0.2)

## 2019-05-11 MED ORDER — HYDROXYZINE HCL 10 MG PO TABS
10.0000 mg | ORAL_TABLET | Freq: Three times a day (TID) | ORAL | Status: DC | PRN
  Administered 2019-05-11: 14:00:00 10 mg via ORAL
  Filled 2019-05-11 (×3): qty 1

## 2019-05-11 MED ORDER — LACTULOSE 10 GM/15ML PO SOLN: 20.0000 g | Freq: Three times a day (TID) | ORAL | 1 refills | Status: DC

## 2019-05-11 MED ORDER — SPIRONOLACTONE 100 MG PO TABS: 50.0000 mg | ORAL_TABLET | Freq: Every day | ORAL | Status: DC

## 2019-05-11 MED ORDER — HYDROXYZINE HCL 10 MG PO TABS
10.0000 mg | ORAL_TABLET | Freq: Three times a day (TID) | ORAL | Status: AC | PRN
  Administered 2019-05-11: 01:00:00 10 mg via ORAL
  Filled 2019-05-11: qty 1

## 2019-05-11 MED ORDER — ENSURE ENLIVE PO LIQD: 237.0000 mL | Freq: Two times a day (BID) | ORAL | Status: DC

## 2019-05-11 NOTE — Discharge Summary (Signed)
Judy Meza OEV:[id number] DOB: 1960-08-28 DOA: 05/09/2019  PCP: Patient, No Pcp Per  Admit date: 05/09/2019 Discharge date: 05/11/2019  Admitted From: home Disposition:  home  Recommendations for Outpatient Follow-up:  1. Follow up with PCP in 1-2 weeks 2. Virtual visit with GI, May 28, 3: 45 pm--[facility] 3. Spirinolactone decreased to 50 mg daily. Lactulose increased to 30 mg TID to have 2-3 BM daily 4. Patient needs to establish with outpatient ENT regarding healing of trach stoma 5. ID follow up with Dr. Linus Salmons in Hep C clinic 6. home with home based palliative care on discharge 7. Please obtain BMP in one week (monitoring creatinine)  Home Health:none  Equipment/Devices: none  Discharge Condition:STABLE  CODE STATUS:DNR/DNI   Brief/Interim Summary: History of present illness:  Judy Meza is a 59 y.o. year old female with medical history significant for COPD, throat cancer status post tracheostomy, cirrhosis of liver, chronic kidney disease stage III, hypertension, alcohol abuse in remission, tobacco abuse, history of IVDU in remission, hepatitis C, depression, dementia recent hospitalization from 04/29/2019-05/02/2019 for hepatic encephalopathy which was treated with lactulose and rifaximin  who presented on 05/09/2019 with reports of increased confusion, inability to follow commands as found to have elevated ammonia of 103 in the ED concerning for hepatic encephalopathy  Further history provided by her sister who noticed worsening confusion, with shaking and flapping on Saturday. She reports after recent discharge from hospital her confusion had greatly improved from that point. She was having BMs at home but not sure how much.  Her sister found her in the bathroom and she had defecated on herself but was   Sister giving 15 mL twice a day because of diarrhea for about at a month and a half(stool water) and concern for dehydration. In a given day she has BM 2-3 times a  day.Remaining hospital course addressed in problem based format below:   Hospital Course:   Decompensated cirrhosis with hepatic encephalopathy, resolved.  Ammonia 106 on admission.  Mental status significantly improved back to baseline with increase in lactulose.  No asterixis on exam.  Suspect decompensation related to low-dose lactulose use.  Patient without ascites, no signs of infection -Discussed with family, closely monitor BMs -Encourage use of lactulose 30 mg three times a day (increase) to keep bowel movements 2-3 times daily -Outpatient GI appointment arranged, 5/28 -Home spironolactone decreased to 50 mg daily for AKI   AKI on CKD stage III.  Baseline creatinine around 1.2-1.3, 1.6 on admission.  Likely due to decreased oral intake while still taking spironolactone. -Have decreased on spironolactone to 50 mg -Repeat BMP by PCP follow-up.  Metabolic acidosis, chronic.  Likely multifactorial etiology related to diarrhea from lactulose and CKD -Continue on sodium bicarb  History of hepatitis C.  Not previously treated.  HCVRNA present. -Outpatient appointment arranged with hep C clinic  Dementia without behavioral disturbances- continue Namenda  Chronic, cytopenia in the setting of cirrhosis.  Platelet remained stable at baseline 50-60 without any signs or symptoms.  Throat cancer status post tracheostomy.  Patient was decannulated about a month ago and previously 18-20.  Patient has not moved since then.  No signs of infection at stoma during hospitalization. -Patient was with family, who is looking to establish MD as outpatient. -Advised tobacco cessation   Consultations:  Palliative care  Procedures/Studies: None Subjective: Feels well. Ready to go home Discharge Exam: Vitals:   05/10/19 2010 05/11/19 0606  BP: (!) 139/96 (!) 144/95  Pulse: 97 89  Resp:  19 18  Temp: 98 F (36.7 C) 98.1 F (36.7 C)  SpO2: 99% 100%   Vitals:   05/10/19 0640 05/10/19 0830  05/10/19 2010 05/11/19 0606  BP: 134/87  (!) 139/96 (!) 144/95  Pulse: 96 (!) 103 97 89  Resp: 17 17 19 18   Temp: 98.1 F (36.7 C)  98 F (36.7 C) 98.1 F (36.7 C)  TempSrc: Oral  Oral   SpO2: 97% 98% 99% 100%  Weight:        General: Lying in bed, no apparent distress Eyes: EOMI, anicteric ENT: Oral Mucosa clear and moist Cardiovascular: regular rate and rhythm, no murmurs, rubs or gallops, no edema, Respiratory: Normal respiratory effort on room air, trach stoma open with some dried secretions around it but no active drainage or erythema,  lungs clear to auscultation bilaterally Abdomen: soft, non-distended, non-tender, normal  bowel sounds Skin: No Rash Neurologic: Grossly no focal neuro deficit.Mental status AAOx3, speech normal, no asterixis Psychiatric:Appropriate affect, and mood  Discharge Diagnoses:  Principal Problem:   Hepatic encephalopathy ([place]) Active Problems:   CKD (chronic kidney disease), stage III   Tobacco abuse   AKI (acute kidney injury) ([place])   Alcoholic cirrhosis of liver with ascites ([facility])   Thrombocytopenia ([facility])   Cirrhosis of liver ([facility])   Pancytopenia ([facility])   Metabolic acidosis   Dementia without behavioral disturbance ([place])   Chronic hepatitis C without hepatic coma [facility])    Discharge Instructions  Discharge Instructions    Diet - low sodium heart healthy   Complete by: As directed    Increase activity slowly   Complete by: As directed      Allergies as of 05/11/2019   No Known Allergies     Medication List    TAKE these medications   albuterol 108 (90 Base) MCG/ACT inhaler Commonly known as: VENTOLIN HFA Inhale 2 puffs into the lungs every 6 (six) hours as needed for wheezing or shortness of breath. Notes to patient: As needed    feeding supplement (ENSURE ENLIVE) Liqd Take 237 mLs by mouth 2 (two) times daily between meals.   furosemide 20 MG tablet Commonly known as: LASIX Take 1 tablet (20 mg total) by mouth daily.    gabapentin 250 MG/5ML solution Commonly known as: NEURONTIN Take 300 mg by mouth at bedtime.   HYDROmorphone 2 MG tablet Commonly known as: DILAUDID Take 2 mg by mouth every 4 (four) hours as needed for severe pain. Notes to patient: As needed    lactulose 10 GM/15ML solution Commonly known as: CHRONULAC Take 30 mLs (20 g total) by mouth 3 (three) times daily. What changed: how much to take   loratadine 10 MG tablet Commonly known as: CLARITIN Take 10 mg by mouth daily.   magic mouthwash Soln Take 5 mLs by mouth.   memantine 5 MG tablet Commonly known as: NAMENDA Take 5 mg by mouth 2 (two) times daily.   nicotine 21 mg/24hr patch Commonly known as: NICODERM CQ - dosed in mg/24 hours Place 1 patch (21 mg total) onto the skin daily.   pantoprazole 40 MG tablet Commonly known as: PROTONIX Take 40 mg by mouth daily.   PARoxetine 40 MG tablet Commonly known as: PAXIL Take 40 mg by mouth every morning.   rifaximin 550 MG Tabs tablet Commonly known as: XIFAXAN Take 550 mg by mouth 2 (two) times daily.   sodium bicarbonate 650 MG tablet Take 1 tablet (650 mg total) by mouth 2 (two) times daily for  20 days.   spironolactone 100 MG tablet Commonly known as: ALDACTONE Take 0.5 tablets (50 mg total) by mouth daily. What changed: how much to take   zolpidem 5 MG tablet Commonly known as: AMBIEN Take 2.5 mg by mouth at bedtime as needed for sleep. Notes to patient: As needed       Follow-up Information    Gastroenterology, [facility]. Call on 06/09/2019.   Why: You have virtual visit arranged on 06/09/19 at 3:45 pm. They will call you before and ensure you have the link to meet your GI doctor Contact information: [place] [phone]          No Known Allergies      The results of significant diagnostics from this hospitalization (including imaging, microbiology, ancillary and laboratory) are listed below for reference.      Microbiology: Recent Results (from the past 240 hour(s))  Respiratory Panel by RT PCR (Flu A&B, Covid) - Nasopharyngeal Swab     Status: None   Collection Time: 05/09/19  9:30 AM   Specimen: Nasopharyngeal Swab  Result Value Ref Range Status   SARS Coronavirus 2 by RT PCR NEGATIVE NEGATIVE Final    Comment: (NOTE) SARS-CoV-2 target nucleic acids are NOT DETECTED. The SARS-CoV-2 RNA is generally detectable in upper respiratoy specimens during the acute phase of infection. The lowest concentration of SARS-CoV-2 viral copies this assay can detect is 131 copies/mL. A negative result does not preclude SARS-Cov-2 infection and should not be used as the sole basis for treatment or other patient management decisions. A negative result may occur with  improper specimen collection/handling, submission of specimen other than nasopharyngeal swab, presence of viral mutation(s) within the areas targeted by this assay, and inadequate number of viral copies (<131 copies/mL). A negative result must be combined with clinical observations, patient history, and epidemiological information. The expected result is Negative. Fact Sheet for Patients:  PinkCheek.be Fact Sheet for Healthcare Providers:  GravelBags.it This test is not yet ap proved or cleared by the [place] FDA and  has been authorized for detection and/or diagnosis of SARS-CoV-2 by FDA under an Emergency Use Authorization (EUA). This EUA will remain  in effect (meaning this test can be used) for the duration of the COVID-19 declaration under Section 564(b)(1) of the Act, 21 U.S.C. section 360bbb-3(b)(1), unless the authorization is terminated or revoked sooner.    Influenza A by PCR NEGATIVE NEGATIVE Final   Influenza B by PCR NEGATIVE NEGATIVE Final    Comment: (NOTE) The Xpert Xpress SARS-CoV-2/FLU/RSV assay is intended as an aid in  the diagnosis of influenza from  Nasopharyngeal swab specimens and  should not be used as a sole basis for treatment. Nasal washings and  aspirates are unacceptable for Xpert Xpress SARS-CoV-2/FLU/RSV  testing. Fact Sheet for Patients: PinkCheek.be Fact Sheet for Healthcare Providers: GravelBags.it This test is not yet approved or cleared by the [place] FDA and  has been authorized for detection and/or diagnosis of SARS-CoV-2 by  FDA under an Emergency Use Authorization (EUA). This EUA will remain  in effect (meaning this test can be used) for the duration of the  Covid-19 declaration under Section 564(b)(1) of the Act, 21  U.S.C. section 360bbb-3(b)(1), unless the authorization is  terminated or revoked. Performed at [facility], [place] [address], [place], [place] [address]      Labs: BNP (last 3 results) Recent Labs    04/30/19 0553  BNP 21.4  Basic Metabolic Panel: Recent Labs  Lab 05/09/19 0930 05/10/19 0918 05/11/19 0517  NA 137 135 137  K 4.6 4.7 4.0  CL 109 111 112*  CO2 19* 17* 18*  GLUCOSE 110* 113* 112*  BUN 29* 22* 19  CREATININE 1.61* 1.40* 1.26*  CALCIUM 9.3 8.8* 8.9   Liver Function Tests: Recent Labs  Lab 05/09/19 0930  AST 41  ALT 26  ALKPHOS 93  BILITOT 1.7*  PROT 7.7  ALBUMIN 3.3*   No results for input(s): LIPASE, AMYLASE in the last 168 hours. Recent Labs  Lab 05/09/19 0930  AMMONIA 106*   CBC: Recent Labs  Lab 05/09/19 0930 05/10/19 0918 05/11/19 0517  WBC 2.8* 3.8* 3.2*  HGB 9.8* 9.5* 9.7*  HCT 29.8* 29.0* 29.6*  MCV 101.0* 101.0* 102.8*  PLT 56* 73* 60*   Cardiac Enzymes: No results for input(s): CKTOTAL, CKMB, CKMBINDEX, TROPONINI in the last 168 hours. BNP: Invalid input(s): POCBNP CBG: Recent Labs  Lab 05/09/19 0849  GLUCAP 92   D-Dimer No results for input(s): DDIMER in the last 72 hours. Hgb A1c No results for input(s): HGBA1C in the last 72 hours. Lipid  Profile No results for input(s): CHOL, HDL, LDLCALC, TRIG, CHOLHDL, LDLDIRECT in the last 72 hours. Thyroid function studies No results for input(s): TSH, T4TOTAL, T3FREE, THYROIDAB in the last 72 hours.  Invalid input(s): FREET3 Anemia work up No results for input(s): VITAMINB12, FOLATE, FERRITIN, TIBC, IRON, RETICCTPCT in the last 72 hours. Urinalysis No results found for: COLORURINE, APPEARANCEUR, [place], [place], [place], [place], [place], [place], PROTEINUR, UROBILINOGEN, NITRITE, LEUKOCYTESUR Sepsis Labs Invalid input(s): PROCALCITONIN,  WBC,  LACTICIDVEN Microbiology Recent Results (from the past 240 hour(s))  Respiratory Panel by RT PCR (Flu A&B, Covid) - Nasopharyngeal Swab     Status: None   Collection Time: 05/09/19  9:30 AM   Specimen: Nasopharyngeal Swab  Result Value Ref Range Status   SARS Coronavirus 2 by RT PCR NEGATIVE NEGATIVE Final    Comment: (NOTE) SARS-CoV-2 target nucleic acids are NOT DETECTED. The SARS-CoV-2 RNA is generally detectable in upper respiratoy specimens during the acute phase of infection. The lowest concentration of SARS-CoV-2 viral copies this assay can detect is 131 copies/mL. A negative result does not preclude SARS-Cov-2 infection and should not be used as the sole basis for treatment or other patient management decisions. A negative result may occur with  improper specimen collection/handling, submission of specimen other than nasopharyngeal swab, presence of viral mutation(s) within the areas targeted by this assay, and inadequate number of viral copies (<131 copies/mL). A negative result must be combined with clinical observations, patient history, and epidemiological information. The expected result is Negative. Fact Sheet for Patients:  PinkCheek.be Fact Sheet for Healthcare Providers:  GravelBags.it This test is not yet ap proved or cleared by the [place] FDA and   has been authorized for detection and/or diagnosis of SARS-CoV-2 by FDA under an Emergency Use Authorization (EUA). This EUA will remain  in effect (meaning this test can be used) for the duration of the COVID-19 declaration under Section 564(b)(1) of the Act, 21 U.S.C. section 360bbb-3(b)(1), unless the authorization is terminated or revoked sooner.    Influenza A by PCR NEGATIVE NEGATIVE Final   Influenza B by PCR NEGATIVE NEGATIVE Final    Comment: (NOTE) The Xpert Xpress SARS-CoV-2/FLU/RSV assay is intended as an aid in  the diagnosis of influenza from Nasopharyngeal swab specimens and  should not be used as a sole basis for treatment. Nasal washings  and  aspirates are unacceptable for Xpert Xpress SARS-CoV-2/FLU/RSV  testing. Fact Sheet for Patients: PinkCheek.be Fact Sheet for Healthcare Providers: GravelBags.it This test is not yet approved or cleared by the [place] FDA and  has been authorized for detection and/or diagnosis of SARS-CoV-2 by  FDA under an Emergency Use Authorization (EUA). This EUA will remain  in effect (meaning this test can be used) for the duration of the  Covid-19 declaration under Section 564(b)(1) of the Act, 21  U.S.C. section 360bbb-3(b)(1), unless the authorization is  terminated or revoked. Performed at [facility], [place] [address], [place], [place] [address]      Time coordinating discharge: Over 30 minutes  SIGNED:   Desiree Hane, MD  [facility] 05/11/2019, 3:10 PM Pager   If 7PM-7AM, please contact night-coverage [email or web address] Password [email or web address]

## 2019-05-11 NOTE — TOC Initial Note (Signed)
Transition of Care [facility]) - Initial/Assessment Note    Patient Details  Name: Judy Meza MRN: [id number] Date of Birth: [birth year]  Transition of Care [facility]) CM/SW Contact:    Lynnell Catalan, RN Phone Number: 05/11/2019, 11:18 AM  Clinical Narrative:                 Pt from home with sister and mother. Pt recently moved to [place] by family. TOC consult to speak to sister about guardianship. This CM spoke with pt at bedside who was A&O x4. This CM then called pt sister and spoke about difference in HCPOA and pt Guardian. Sister encouraged to reach out to DSS for assistance with this. Sister states that they have been working with DSS to switch pt medicaid over to [facility]. She states that she will contact them again. Pt to dc back with mother and sister at discharge. Per PT eval no PT follow up needed at DC.  Expected Discharge Plan: Home/Self Care Barriers to Discharge: Continued Medical Work up   Expected Discharge Plan and Services Expected Discharge Plan: Home/Self Care         Expected Discharge Date: (unknown)                Prior Living Arrangements/Services   Lives with:: Relatives                   Activities of Daily Living Home Assistive Devices/Equipment: Eyeglasses ADL Screening (condition at time of admission) Patient's cognitive ability adequate to safely complete daily activities?: No Is the patient deaf or have difficulty hearing?: No Does the patient have difficulty seeing, even when wearing glasses/contacts?: No Does the patient have difficulty concentrating, remembering, or making decisions?: No Patient able to express need for assistance with ADLs?: Yes Does the patient have difficulty dressing or bathing?: No Independently performs ADLs?: Yes (appropriate for developmental age) Does the patient have difficulty walking or climbing stairs?: Yes(family stays with her when she is using stairs) Weakness of Legs: Both Weakness of Arms/Hands:  None   Emotional Assessment Appearance:: Appears older than stated age Attitude/Demeanor/Rapport: Self-Confident Affect (typically observed): Calm Orientation: : Oriented to Self, Oriented to Situation, Oriented to  Time, Oriented to Place      Admission diagnosis:  Hepatic encephalopathy (HCC) [K72.90] Thrombocytopenia ([place]) [D69.6] Acute hepatic encephalopathy [K72.00] Patient Active Problem List   Diagnosis Date Noted  . Metabolic acidosis 97/98/9211  . Dementia without behavioral disturbance ([place]) 05/10/2019  . Chronic hepatitis C without hepatic coma ([place]) 05/10/2019  . Hepatic encephalopathy ([place]) 05/09/2019  . Pancytopenia ([place])   . Cirrhosis of liver ([place])   . CKD (chronic kidney disease), stage III 04/29/2019  . COPD with chronic bronchitis ([place]) 04/29/2019  . Tobacco abuse 04/29/2019  . Alcoholism in remission ([place]) 04/29/2019  . Acute hepatic encephalopathy 04/29/2019  . AKI (acute kidney injury) ([place]) 04/29/2019  . Alcoholic cirrhosis of liver with ascites ([place]) 04/29/2019  . Thrombocytopenia ([place]) 04/29/2019  . Hyperkalemia 04/29/2019   PCP:  Patient, No Pcp Per Pharmacy:   [id number] - [place], [place] [address] Phone: [phone] Fax: [phone]     Social Determinants of Health (SDOH) Interventions    Readmission Risk Interventions No flowsheet data found.

## 2019-05-11 NOTE — Progress Notes (Signed)
Patient is ready for discharge. Patient is dressed - Waiting on D/C orders for discharge teaching  - Patient will be taken down in wheelchair at time of discharge.

## 2019-05-11 NOTE — Discharge Instructions (Signed)
New Medication Changes Lactulose 30 ml(20 g) three times daily to have 2-3 bowel movements a day  Decrease your spironolactone to 1/2 tablet (50 mg ) daily  You have virtual visit arranged on 06/09/19 at 3:45 pm. They will call you before and ensure you have the link to meet your GI doctor  You will be called to have an appointment with Dr. Linus Salmons (infectious disease) for management of your hepatitis C    Hepatic Encephalopathy  Hepatic encephalopathy is a loss of brain function due to advanced liver disease. When the liver is damaged, harmful substances (toxins) can build up in the body. Some of these toxins, such as ammonia, can harm the brain. The effects of the condition depend on the type of liver damage and how severe it is. In some cases, hepatic encephalopathy can be reversed. What are the causes? Certain things can trigger or worsen hepatic encephalopathy, such as:  Infection.  Constipation.  Taking certain medicines, such as benzodiazepines.  Alcohol use.  Bleeding into the intestinal tract.  Imbalances in minerals (electrolytes) in the body.  Dehydration. Hepatic encephalopathy can sometimes be reversed if these triggers are resolved. What increases the risk? You are at risk of developing this condition if you have advanced liver disease (cirrhosis). Conditions that can cause liver disease include:  Infections in the liver, such as hepatitis C.  Infections in the blood.  Drinking a lot of alcohol over a long period of time.  Taking certain medicines, including tranquilizers, diuretics, antidepressants, sleeping pills, or acetaminophen.  Genetic diseases, such as Wilson's disease. What are the signs or symptoms? Symptoms may develop suddenly. Or, they may develop slowly and get worse gradually. Symptoms can range from mild to severe. Mild symptoms include:  Mild confusion.  Shortened attention span.  Personality and mood changes.  Anxiety and  agitation.  Drowsiness. Symptoms of worsening or severe hepatic encephalopathy include:  Extreme confusion (disorientation).  Slowed movement.  Slurred speech.  Extreme personality changes.  Abnormal shaking or flapping of the hands.  Coma. How is this diagnosed? This condition may be diagnosed based on:  A physical exam.  Your symptoms and medical history.  Blood tests. These may be done to check levels of ammonia in your blood, measure how long it takes your blood to clot, or check for infection.  Liver function tests. These may be done to check how well your liver is working.  MRI and CT scans. These may be done to check for a brain disorder and to check for problems with your liver.  Electroencephalogram (EEG). This test measures the electrical activity in your brain. How is this treated? The first step in treatment is to identify and treat the cause of your liver damage or triggering illness, if possible. The next step is taking medicine to lower the level of toxins in your body and prevent ammonia from building up. Treatment will depend on how severe your encephalopathy is, and may include:  Medicine to lower your ammonia level (lactulose).  Antibiotic medicine to reduce the amount of ammonia-producing bacteria in your gut.  Close monitoring of your blood pressure, heart rate, breathing, and oxygen levels.  Removal of fluid from your abdomen.  Close monitoring of how you think, feel, and act (mental status).  Dietary changes.  Liver transplant, in severe cases. Follow these instructions at home: Eating and drinking   Work with a dietitian or with your health care provider to make sure you are getting the right balance of  protein and minerals.  Drink enough fluids to keep your urine pale yellow.  Do not drink alcohol or use drugs. General instructions  If you were prescribed an antibiotic, take it as told by your health care provider. Do not stop taking  the antibiotic even if your condition improves.  Take other over-the-counter and prescription medicines only as told by your health care provider.  Do not start taking any new medicines, including over-the-counter medicines, without first checking with your health care provider.  Keep all follow-up visits as told by your health care provider. This is important. Contact a health care provider if:  You develop new symptoms.  Your symptoms change or get worse.  You have a fever.  You are constipated. Signs of constipation include having: ? Fewer bowel movements in a week than normal. ? Trouble having a bowel movement. ? Stools that are dry, hard, or larger than normal.  You have persistent nausea, vomiting, or diarrhea. Get help right away if:  You become very confused or drowsy.  You vomit blood or material that looks like coffee grounds.  Your stool is bloody, black, or looks like tar. Summary  Hepatic encephalopathy is a loss of brain function due to advanced liver disease. When the liver is damaged, harmful substances (toxins) can build up in your body. Some of these toxins, such as ammonia, can harm your brain.  Certain things can trigger or worsen hepatic encephalopathy. Hepatic encephalopathy can sometimes be reversed if these triggers are resolved.  The first step in treatment is to identify and treat the cause of your liver damage or triggering illness, if possible. The next step is taking medicine to lower the level of toxins in your body and prevent ammonia from building up.  Your treatment will depend on how severe your hepatic encephalopathy is. This information is not intended to replace advice given to you by your health care provider. Make sure you discuss any questions you have with your health care provider. Document Revised: 12/11/2016 Document Reviewed: 09/29/2016 Elsevier Patient Education  2020 [place].   Liver Failure  The liver is a large organ  in the upper right-hand side of the abdomen. It is involved in many important functions, including storing energy, producing fluids that the body needs, and removing harmful substances from the bloodstream. Liver failure is a condition in which the liver loses its ability to function due to injury or disease. Liver failure can develop quickly, over days or weeks (acute liver failure). It can also develop gradually, over months or years (chronic liver failure). What are the causes? Common causes of acute liver failure include:  Acetaminophen overdose.  Reaction to certain medicines.  Liver infection (viral hepatitis). Common causes of chronic liver failure include:  Viral hepatitis.  Liver disease.  Alcohol abuse. What are the signs or symptoms? Most people do not have symptoms in the early stages of liver failure. If early symptoms do develop, they may include:  Loss of appetite.  Nausea and vomiting.  Weakness and tiredness (fatigue).  Weight loss without trying.  Diarrhea. As the condition worsens, symptoms of this condition may include:  Yellowing of the skin and the whites of the eyes (jaundice).  Bruising and bleeding easily.  Itchy skin (pruritus).  Fluid buildup in the abdomen (ascites).  Personality and mood changes.  Confusion and sleepiness. How is this diagnosed? This condition is diagnosed based on your symptoms, your medical history, and a physical exam. You may have tests, including:  Blood tests.  CT scan.  MRI.  Ultrasound.  Removal of a small amount of liver tissue to be examined under a microscope (biopsy). How is this treated? Treatment for this condition depends on the cause and severity of symptoms. Treatment for chronic liver failure may include:  Medicines to help reduce symptoms.  Lifestyle changes, such as limiting salt and animal proteins in your diet. Foods that contain animal proteins include red meat, fish, and dairy  products. Acute or advanced (end stage) liver failure may require hospitalization. Treatment may include:  Giving antibiotic medicine.  Giving IV fluids that contain sugar (glucose) and minerals (electrolytes).  Flushing out toxic substances from the body using medicine (lactulose) or a cleansing procedure (enema).  Adding certain amounts of the liquid part of blood (plasma) to your bloodstream (receiving a transfusion).  Using an artificial kidney to filter your blood (hemodialysis) if you have renal failure.  Using breathing support and a breathing tube (respirator).  Having a liver transplant. This is a surgery to replace your liver with another person's liver (donor liver). This may be the best option if your liver has completely stopped functioning. Follow these instructions at home: Eating and drinking  Follow instructions from your health care provider about eating and drinking restrictions. This may include: ? Limiting the amount of animal protein that you eat. ? Increasing the amount of plant-based protein that you eat. Foods that contain plant-based proteins include whole grains, nuts, and vegetables. ? Taking vitamin supplements. ? Limiting the amount of salt that you eat. Lifestyle   Do not drink alcohol.  Do not use any products that contain nicotine or tobacco, such as cigarettes, e-cigarettes, and chewing tobacco. If you need help quitting, ask your health care provider.  Exercise regularly, as told by your health care provider. General instructions  Take over-the-counter and prescription medicines only as told by your health care provider.  Follow instructions from your health care provider about maintaining your vaccinations, especially vaccinations against hepatitis A and B.  Keep all follow-up visits as told by your health care provider. This is important. Contact a health care provider if:  You have symptoms that get worse.  You lose a lot of weight  without trying.  You have a fever or chills. Get help right away if:  You become confused or very sleepy.  You cannot take care of yourself or be taken care of at home.  You are not urinating.  You have difficulty breathing.  You vomit blood. Summary  Liver failure is a condition in which the liver loses its ability to function due to injury or disease.  Treatment for this condition depends on the cause and severity of symptoms.  Take over-the-counter and prescription medicines only as told by your health care provider.  Contact a health care provider if you have symptoms that get worse.  Keep all follow-up visits as told by your health care provider. This is important. This information is not intended to replace advice given to you by your health care provider. Make sure you discuss any questions you have with your health care provider. Document Revised: 06/09/2017 Document Reviewed: 06/09/2017 Elsevier Patient Education  [place].   Thrombocytopenia Thrombocytopenia means that you have a low number of platelets in your blood. Platelets are tiny cells in the blood. When you bleed, they clump together at the cut or injury to stop the bleeding. This is called blood clotting. If you do not have enough platelets, it can  cause bleeding problems. Some cases of this condition are mild while others are more severe. What are the causes? This condition may be caused by:  Your body not making enough platelets. This may be caused by: ? Your bone marrow not making blood cells (aplastic anemia). ? Cancer in the bone marrow. ? Certain medicines. ? Infection in the bone marrow. ? Drinking a lot of alcohol.  Your body destroying platelets too quickly. This may be caused by: ? Certain immune diseases. ? Certain medicines. ? Certain blood clotting disorders. ? Certain disorders that are passed from parent to child (inherited). ? Certain bleeding  disorders. ? Pregnancy. ? Having a spleen that is larger than normal. What are the signs or symptoms?  Bleeding that is not normal.  Nosebleeds.  Heavy menstrual periods.  Blood in the pee (urine) or poop (stool).  A purple-like color to the skin (purpura).  Bruising.  A rash that looks like pinpoint, purple-red spots (petechiae). How is this treated?  Treatment of another condition that is causing the low platelet count.  Medicines to help protect your platelets from being destroyed.  A replacement (transfusion) of platelets to stop or prevent bleeding.  Surgery to remove the spleen. Follow these instructions at home: Activity  Avoid activities that could cause you to get hurt or bruised. Follow instructions about how to prevent falls.  Take care not to cut yourself: ? When you shave. ? When you use scissors, needles, knives, or other tools.  Take care not to burn yourself: ? When you use an iron. ? When you cook. General instructions   Check your skin and the inside of your mouth for bruises or blood as told by your doctor.  Check to see if there is blood in your spit (sputum), pee, and poop. Do this as told by your doctor.  Do not drink alcohol.  Take over-the-counter and prescription medicines only as told by your doctor.  Do not take any medicines that have aspirin or NSAIDs in them. These medicines can thin your blood and cause you to bleed.  Tell all of your doctors that you have this condition. Be sure to tell your dentist and eye doctor too. Contact a doctor if:  You have bruises and you do not know why. Get help right away if:  You are bleeding anywhere on your body.  You have blood in your spit, pee, or poop. Summary  Thrombocytopenia means that you have a low number of platelets in your blood.  Platelets are needed for blood clotting.  Symptoms of this condition include bleeding that is not normal, and bruising.  Take care not to cut or  burn yourself. This information is not intended to replace advice given to you by your health care provider. Make sure you discuss any questions you have with your health care provider. Document Revised: 09/30/2017 Document Reviewed: 09/30/2017 Elsevier Patient Education  [place].

## 2019-05-11 NOTE — Progress Notes (Signed)
PMT progress note  Patient doesn't engage much today, resting in bed, has finished most of her breakfast. Appears to have regular work of breathing, in no distress. Chart reviewed, no family at bedside.   BP (!) 144/95 (BP Location: Right Arm)   Pulse 89   Temp 98.1 F (36.7 C)   Resp 18   Wt 47 kg   SpO2 100%   BMI 18.97 kg/m   Decompensated cirrhosis AKI III CKD Chronic anemia, chronic thrombocytopenia History of throat cancer s/p trach PEG which were removed recently.  PPS 40%  Recommend home with home based palliative care on discharge Continue current mode of care DNR DNI.  Greater than 50%  of this time was spent counseling and coordinating care related to the above assessment and plan. 15 minutes spent [facility] palliative [phone].

## 2019-05-11 NOTE — Progress Notes (Signed)
Initial Nutrition Assessment  DOCUMENTATION CODES:   Severe malnutrition in context of chronic illness  INTERVENTION:  Ensure Enlive po BID, each supplement provides 350 kcal and 20 grams of protein ([place]berry)  Magic cup BID with meals, each supplement provides 290 kcal and 9 grams of protein   NUTRITION DIAGNOSIS:   Severe Malnutrition related to chronic illness as evidenced by moderate fat depletion, severe fat depletion, severe muscle depletion, moderate muscle depletion.    GOAL:   Patient will meet greater than or equal to 90% of their needs    MONITOR:   PO intake, Supplement acceptance, Labs, Weight trends, I & O's  REASON FOR ASSESSMENT:   Consult Assessment of nutrition requirement/status  ASSESSMENT:  59 year old female with past medical history significant of COPD, throat cancer s/p tracheostomy, cirrhosis of liver, CKD stage III, HTN, alcohol abuse in remission, history of IVDU in remission, hepatitis C, depression, dementia, and recent hospitalization 4/17-4/20 for hepatic encephalopathy and treated with lactulose and rifaximin presents with 2 day history of AMS and admitted with hepatic encephalopathy.  Per chart review, patient decannulated ~ 2 months ago. Patient awake, alert, sitting up in bed playing a game on her cell phone this afternoon.Patient reports feeling hungry and was waiting on her sister to return with medication from home before ordering lunch. She endorses good appetite/intake at home, recalls usually eating 3 meals/day prepared by her sister of aunt, drinks Ensure daily and likes the strawberry flavor.  Patient recalls weighing 135 lbs before beginning  radiation treatments and reports she was 165 lbs a few years ago prior to throat cancer diagnosis. Currently she weighs 103.4 lbs, and on 4/17 pt weighed 104.72 lbs.   Medications reviewed and include: Lactulose, Namenda, Protonix, Paxil, Rifaximin, Sodium bicarbonate Labs  reviewed   NUTRITION - FOCUSED PHYSICAL EXAM:    Most Recent Value  Orbital Region  Moderate depletion  Upper Arm Region  Severe depletion  Thoracic and Lumbar Region  Severe depletion  Buccal Region  Moderate depletion  Temple Region  Severe depletion  Clavicle Bone Region  Severe depletion  Clavicle and Acromion Bone Region  Severe depletion  Scapular Bone Region  Moderate depletion  Dorsal Hand  Moderate depletion  Patellar Region  Severe depletion  Anterior Thigh Region  Unable to assess  Posterior Calf Region  Severe depletion  Edema (RD Assessment)  None  Hair  Reviewed  Eyes  Reviewed  Mouth  Reviewed [poor dentition]  Skin  Reviewed  Nails  Reviewed       Diet Order:   Diet Order            Diet regular Room service appropriate? Yes; Fluid consistency: Thin  Diet effective now              EDUCATION NEEDS:   No education needs have been identified at this time  Skin:  Skin Assessment: Reviewed RN Assessment  Last BM:  4/27 type 5; type 7  Height:   Ht Readings from Last 1 Encounters:  04/29/19 5\' 2"  (1.575 m)    Weight:   Wt Readings from Last 1 Encounters:  05/09/19 47 kg    BMI:  Body mass index is 18.97 kg/m.  Estimated Nutritional Needs:   Kcal:  1700-1900  Protein:  80-95  Fluid:  >/= 1.7 L/day   Lajuan Lines, RD, LDN Clinical Nutrition After Hours/Weekend Pager # in [place]

## 2019-05-22 ENCOUNTER — Telehealth: Payer: Self-pay | Admitting: Pharmacy Technician

## 2019-05-22 ENCOUNTER — Other Ambulatory Visit: Payer: Self-pay

## 2019-05-22 ENCOUNTER — Encounter: Payer: Self-pay | Admitting: Internal Medicine

## 2019-05-22 ENCOUNTER — Ambulatory Visit (INDEPENDENT_AMBULATORY_CARE_PROVIDER_SITE_OTHER): Payer: Medicaid Other | Admitting: Internal Medicine

## 2019-05-22 VITALS — Wt 114.0 lb

## 2019-05-22 DIAGNOSIS — B182 Chronic viral hepatitis C: Secondary | ICD-10-CM | POA: Diagnosis not present

## 2019-05-22 DIAGNOSIS — K7030 Alcoholic cirrhosis of liver without ascites: Secondary | ICD-10-CM | POA: Diagnosis not present

## 2019-05-22 NOTE — Telephone Encounter (Signed)
[facility] Patient Advocate Encounter ° °Insurance verification completed.   ° °The patient is uninsured and will need patient assistance for medication. ° °We can complete the application and will need to meet with the patient for signatures and income documentation. ° °Judy Meza E. Judy Meza, CPhT °Specialty Pharmacy Patient Advocate °[facility] °Phone: [phone] °Fax:  [phone] ° ° °

## 2019-05-22 NOTE — Progress Notes (Signed)
[facility] for Infectious Disease   CC: consideration for treatment for chronic hepatitis C  HPI:  +Judy Meza is a 59 y.o. female who presents for initial evaluation and management of chronic hepatitis C.  Patient tested positive recently after moving here. Hepatitis C-associated risk factors present are: IV drug abuse (details: not currently using drugs and cared for by her sister). Patient denies renal dialysis. Patient has had other studies performed. Results: hepatitis C RNA by PCR, result: positive. Patient has not had prior treatment for Hepatitis C. Patient does have a past history of liver disease. Patient does not have a family history of liver disease. Patient does  have associated signs or symptoms related to liver disease.  Labs reviewed and confirm chronic hepatitis C with a positive viral load.   Records reviewed from Epic and she has a positive viral load and other issues are advanced liver disease.  She is on lactulose and diuretics.  Admitted recently with AMS due to her hepatic encephalopathy.   Child Pugh B    Patient does not have documented immunity to Hepatitis A. Patient does not have documented immunity to Hepatitis B.    Review of Systems:  Constitutional: negative for fevers and chills Gastrointestinal: negative for nausea and diarrhea All other systems reviewed and are negative       Past Medical History:  Diagnosis Date  . Cancer ([place])   . COPD (chronic obstructive pulmonary disease) ([place])   . Hypertension   . Liver disease   . Renal disorder     Prior to Admission medications   Medication Sig Start Date End Date Taking? Authorizing Provider  albuterol (VENTOLIN HFA) 108 (90 Base) MCG/ACT inhaler Inhale 2 puffs into the lungs every 6 (six) hours as needed for wheezing or shortness of breath.    [provider]  feeding supplement, ENSURE ENLIVE, (ENSURE ENLIVE) LIQD Take 237 mLs by mouth 2 (two) times daily between meals. 05/02/19    Eugenie Filler, MD  furosemide (LASIX) 20 MG tablet Take 1 tablet (20 mg total) by mouth daily. 05/04/19   Eugenie Filler, MD  gabapentin (NEURONTIN) 250 MG/5ML solution Take 300 mg by mouth at bedtime.    [provider]  HYDROmorphone (DILAUDID) 2 MG tablet Take 2 mg by mouth every 4 (four) hours as needed for severe pain.    [provider]  lactulose (CHRONULAC) 10 GM/15ML solution Take 30 mLs (20 g total) by mouth 3 (three) times daily. 05/11/19   Desiree Hane, MD  loratadine (CLARITIN) 10 MG tablet Take 10 mg by mouth daily.    [provider]  magic mouthwash SOLN Take 5 mLs by mouth.    [provider]  memantine (NAMENDA) 5 MG tablet Take 5 mg by mouth 2 (two) times daily.    [provider]  nicotine (NICODERM CQ - DOSED IN MG/24 HOURS) 21 mg/24hr patch Place 1 patch (21 mg total) onto the skin daily. Patient not taking: Reported on 05/09/2019 05/03/19   Eugenie Filler, MD  pantoprazole (PROTONIX) 40 MG tablet Take 40 mg by mouth daily.    [provider]  PARoxetine (PAXIL) 40 MG tablet Take 40 mg by mouth every morning.    [provider]  rifaximin (XIFAXAN) 550 MG TABS tablet Take 550 mg by mouth 2 (two) times daily.    [provider]  sodium bicarbonate 650 MG tablet Take 1 tablet (650 mg total) by mouth 2 (two)  times daily for 20 days. 05/02/19 05/22/19  Eugenie Filler, MD  spironolactone (ALDACTONE) 100 MG tablet Take 0.5 tablets (50 mg total) by mouth daily. 05/11/19   Desiree Hane, MD  zolpidem (AMBIEN) 5 MG tablet Take 2.5 mg by mouth at bedtime as needed for sleep.    [provider]    No Known Allergies  Social History   Tobacco Use  . Smoking status: Current Every Day Smoker    Packs/day: 0.25    Types: Cigarettes  . Smokeless tobacco: Never Used  Substance Use Topics  . Alcohol use: Not Currently  . Drug use: Not Currently    Family History  Problem Relation Age  of Onset  . Healthy Mother   . Hypertension Other       Objective:  Constitutional: in no apparent distress, There were no vitals filed for this visit. Eyes: anicteric Cardiovascular: Cor RRR Respiratory: clear Gastrointestinal: Bowel sounds are normal, liver is not enlarged, spleen is not enlarged Musculoskeletal: no pedal edema noted Skin: negatives: no rash; no porphyria cutanea tarda Lymphatic: no cervical lymphadenopathy   Laboratory Genotype: No results found for: HCVGENOTYPE HCV viral load:  Lab Results  Component Value Date   HCVQUANT 2,870 04/30/2019   Lab Results  Component Value Date   WBC 3.2 (L) 05/11/2019   HGB 9.7 (L) 05/11/2019   HCT 29.6 (L) 05/11/2019   MCV 102.8 (H) 05/11/2019   PLT 60 (L) 05/11/2019    Lab Results  Component Value Date   CREATININE 1.26 (H) 05/11/2019   BUN 19 05/11/2019   NA 137 05/11/2019   K 4.0 05/11/2019   CL 112 (H) 05/11/2019   CO2 18 (L) 05/11/2019    Lab Results  Component Value Date   ALT 26 05/09/2019   AST 41 05/09/2019   ALKPHOS 93 05/09/2019     Labs and history reviewed and show CHILD-PUGH B  5-6 points: Child class A 7-9 points: Child class B 10-15 points: Child class C  Lab Results  Component Value Date   INR 1.2 05/09/2019   BILITOT 1.7 (H) 05/09/2019   ALBUMIN 3.3 (L) 05/09/2019     Assessment: New Patient with Chronic Hepatitis C genotype unknown, untreated.  I discussed with the patient the lab findings that confirm chronic hepatitis C as well as the natural history and progression of disease including about 30% of people who develop cirrhosis of the liver if left untreated and once cirrhosis is established there is a 2-7% risk per year of liver cancer and liver failure.  I discussed the importance of treatment and benefits in reducing the risk, even if significant liver fibrosis exists.   Plan: 1) will check a genotype and determine treatment.  Will avoid a PI due to advanced liver disease.   Pancytopenic so will avoid ribavirin.  Will consider Epclusa for 24 weeks once I get genotype back.  2) rtc after starting medication, will schedule for 6 weeks 3) follow up with GI

## 2019-05-22 NOTE — Telephone Encounter (Signed)
[facility] Patient Advocate Encounter  Judy Meza and Support [employer] applications, signatures and income information.  Will follow until medication is written.  Judy Meza. Judy Meza Patient [facility] for Infectious Disease Phone: [phone] Fax:  [phone]

## 2019-05-24 ENCOUNTER — Other Ambulatory Visit: Payer: Self-pay | Admitting: Internal Medicine

## 2019-05-24 ENCOUNTER — Telehealth: Payer: Self-pay | Admitting: Pharmacy Technician

## 2019-05-24 DIAGNOSIS — B182 Chronic viral hepatitis C: Secondary | ICD-10-CM

## 2019-05-24 MED ORDER — SOFOSBUVIR-VELPATASVIR 400-100 MG PO TABS
1.0000 | ORAL_TABLET | Freq: Every day | ORAL | 5 refills | Status: DC
Start: 2019-05-24 — End: 2019-07-12

## 2019-05-24 NOTE — Telephone Encounter (Signed)
[facility] Patient Advocate Encounter  Completed and sent Support Path application for Epclusa for this patient who is uninsured.    Patient assistance phone number for follow up is [phone].   This encounter will be updated until final determination.   Judy Meza. Nadara Mustard [place] Patient [facility] for Infectious Disease Phone: [phone] Fax:  [phone]

## 2019-05-25 LAB — HEPATITIS B SURFACE ANTIBODY,QUALITATIVE: Hep B S Ab: NONREACTIVE

## 2019-05-25 LAB — HEPATITIS A ANTIBODY, TOTAL: Hepatitis A AB,Total: NONREACTIVE

## 2019-05-25 LAB — HEPATITIS C GENOTYPE

## 2019-05-30 ENCOUNTER — Telehealth: Payer: Self-pay | Admitting: Pharmacist

## 2019-05-30 ENCOUNTER — Encounter: Payer: Self-pay | Admitting: Pharmacy Technician

## 2019-05-30 ENCOUNTER — Other Ambulatory Visit: Payer: Self-pay | Admitting: Pharmacist

## 2019-05-30 NOTE — Telephone Encounter (Deleted)
Thanks Dorothea Ogle!

## 2019-05-30 NOTE — Telephone Encounter (Signed)
Thanks Dorothea Ogle!

## 2019-06-01 ENCOUNTER — Ambulatory Visit (INDEPENDENT_AMBULATORY_CARE_PROVIDER_SITE_OTHER): Payer: Medicaid Other | Admitting: Nurse Practitioner

## 2019-06-01 ENCOUNTER — Other Ambulatory Visit: Payer: Self-pay

## 2019-06-01 ENCOUNTER — Encounter: Payer: Self-pay | Admitting: Nurse Practitioner

## 2019-06-01 VITALS — BP 146/93 | HR 89 | Temp 98.0°F | Ht 62.0 in | Wt 119.8 lb

## 2019-06-01 DIAGNOSIS — B182 Chronic viral hepatitis C: Secondary | ICD-10-CM | POA: Diagnosis not present

## 2019-06-01 DIAGNOSIS — F17209 Nicotine dependence, unspecified, with unspecified nicotine-induced disorders: Secondary | ICD-10-CM

## 2019-06-01 DIAGNOSIS — I10 Essential (primary) hypertension: Secondary | ICD-10-CM

## 2019-06-01 DIAGNOSIS — F419 Anxiety disorder, unspecified: Secondary | ICD-10-CM

## 2019-06-01 DIAGNOSIS — F1027 Alcohol dependence with alcohol-induced persisting dementia: Secondary | ICD-10-CM | POA: Diagnosis not present

## 2019-06-01 DIAGNOSIS — G4709 Other insomnia: Secondary | ICD-10-CM | POA: Diagnosis not present

## 2019-06-01 DIAGNOSIS — L299 Pruritus, unspecified: Secondary | ICD-10-CM | POA: Diagnosis not present

## 2019-06-01 DIAGNOSIS — C140 Malignant neoplasm of pharynx, unspecified: Secondary | ICD-10-CM

## 2019-06-01 DIAGNOSIS — N179 Acute kidney failure, unspecified: Secondary | ICD-10-CM | POA: Diagnosis not present

## 2019-06-01 DIAGNOSIS — Z0000 Encounter for general adult medical examination without abnormal findings: Secondary | ICD-10-CM

## 2019-06-01 LAB — GLUCOSE, POCT (MANUAL RESULT ENTRY): POC Glucose: 152 mg/dl — AB (ref 70–99)

## 2019-06-01 LAB — POCT GLYCOSYLATED HEMOGLOBIN (HGB A1C): Hemoglobin A1C: 5.1 % (ref 4.0–5.6)

## 2019-06-01 MED ORDER — GABAPENTIN 250 MG/5ML PO SOLN: 300.0000 mg | Freq: Every day | ORAL | 0 refills | Status: DC

## 2019-06-01 MED ORDER — HYDROXYZINE HCL 10 MG PO TABS: 10.0000 mg | ORAL_TABLET | Freq: Three times a day (TID) | ORAL | 0 refills | Status: DC | PRN

## 2019-06-01 MED ORDER — ZOLPIDEM TARTRATE 5 MG PO TABS: 2.5000 mg | ORAL_TABLET | Freq: Every evening | ORAL | 0 refills | Status: DC | PRN

## 2019-06-01 MED ORDER — LORATADINE 10 MG PO TABS: 10.0000 mg | ORAL_TABLET | Freq: Every day | ORAL | 0 refills | Status: DC

## 2019-06-01 MED ORDER — PAROXETINE HCL 40 MG PO TABS: 40.0000 mg | ORAL_TABLET | ORAL | 2 refills | Status: DC

## 2019-06-01 MED ORDER — ALBUTEROL SULFATE (2.5 MG/3ML) 0.083% IN NEBU
2.5000 mg | INHALATION_SOLUTION | Freq: Four times a day (QID) | RESPIRATORY_TRACT | 1 refills | Status: DC | PRN
Start: 2019-06-01 — End: 2020-09-12

## 2019-06-01 MED ORDER — LACTULOSE 10 GM/15ML PO SOLN: 20.0000 g | Freq: Three times a day (TID) | ORAL | 1 refills | Status: DC

## 2019-06-01 MED ORDER — ALBUTEROL SULFATE HFA 108 (90 BASE) MCG/ACT IN AERS: 2.0000 | INHALATION_SPRAY | Freq: Four times a day (QID) | RESPIRATORY_TRACT | 1 refills | Status: DC | PRN

## 2019-06-01 MED ORDER — MEMANTINE HCL 5 MG PO TABS: 5.0000 mg | ORAL_TABLET | Freq: Two times a day (BID) | ORAL | 2 refills | Status: DC

## 2019-06-01 MED ORDER — AMLODIPINE BESYLATE 5 MG PO TABS: 5.0000 mg | ORAL_TABLET | Freq: Every day | ORAL | 3 refills | Status: DC

## 2019-06-01 NOTE — Patient Instructions (Signed)
Steps to Quit Smoking Smoking tobacco is the leading cause of preventable death. It can affect almost every organ in the body. Smoking puts you and people around you at risk for many serious, long-lasting (chronic) diseases. Quitting smoking can be hard, but it is one of the best things that you can do for your health. It is never too late to quit. How do I get ready to quit? When you decide to quit smoking, make a plan to help you succeed. Before you quit:  Pick a date to quit. Set a date within the next 2 weeks to give you time to prepare.  Write down the reasons why you are quitting. Keep this list in places where you will see it often.  Tell your family, friends, and co-workers that you are quitting. Their support is important.  Talk with your doctor about the choices that may help you quit.  Find out if your health insurance will pay for these treatments.  Know the people, places, things, and activities that make you want to smoke (triggers). Avoid them. What first steps can I take to quit smoking?  Throw away all cigarettes at home, at work, and in your car.  Throw away the things that you use when you smoke, such as ashtrays and lighters.  Clean your car. Make sure to empty the ashtray.  Clean your home, including curtains and carpets. What can I do to help me quit smoking? Talk with your doctor about taking medicines and seeing a counselor at the same time. You are more likely to succeed when you do both.  If you are pregnant or breastfeeding, talk with your doctor about counseling or other ways to quit smoking. Do not take medicine to help you quit smoking unless your doctor tells you to do so. To quit smoking: Quit right away  Quit smoking totally, instead of slowly cutting back on how much you smoke over a period of time.  Go to counseling. You are more likely to quit if you go to counseling sessions regularly. Take medicine You may take medicines to help you quit. Some  medicines need a prescription, and some you can buy over-the-counter. Some medicines may contain a drug called nicotine to replace the nicotine in cigarettes. Medicines may:  Help you to stop having the desire to smoke (cravings).  Help to stop the problems that come when you stop smoking (withdrawal symptoms). Your doctor may ask you to use:  Nicotine patches, gum, or lozenges.  Nicotine inhalers or sprays.  Non-nicotine medicine that is taken by mouth. Find resources Find resources and other ways to help you quit smoking and remain smoke-free after you quit. These resources are most helpful when you use them often. They include:  Online chats with a counselor.  Phone quitlines.  Printed self-help materials.  Support groups or group counseling.  Text messaging programs.  Mobile phone apps. Use apps on your mobile phone or tablet that can help you stick to your quit plan. There are many free apps for mobile phones and tablets as well as websites. Examples include Quit Guide from the CDC and smokefree.gov  What things can I do to make it easier to quit?   Talk to your family and friends. Ask them to support and encourage you.  Call a phone quitline (1-800-QUIT-NOW), reach out to support groups, or work with a counselor.  Ask people who smoke to not smoke around you.  Avoid places that make you want to smoke,   such as: ? Bars. ? Parties. ? Smoke-break areas at work.  Spend time with people who do not smoke.  Lower the stress in your life. Stress can make you want to smoke. Try these things to help your stress: ? Getting regular exercise. ? Doing deep-breathing exercises. ? Doing yoga. ? Meditating. ? Doing a body scan. To do this, close your eyes, focus on one area of your body at a time from head to toe. Notice which parts of your body are tense. Try to relax the muscles in those areas. How will I feel when I quit smoking? Day 1 to 3 weeks Within the first 24 hours,  you may start to have some problems that come from quitting tobacco. These problems are very bad 2-3 days after you quit, but they do not often last for more than 2-3 weeks. You may get these symptoms:  Mood swings.  Feeling restless, nervous, angry, or annoyed.  Trouble concentrating.  Dizziness.  Strong desire for high-sugar foods and nicotine.  Weight gain.  Trouble pooping (constipation).  Feeling like you may vomit (nausea).  Coughing or a sore throat.  Changes in how the medicines that you take for other issues work in your body.  Depression.  Trouble sleeping (insomnia). Week 3 and afterward After the first 2-3 weeks of quitting, you may start to notice more positive results, such as:  Better sense of smell and taste.  Less coughing and sore throat.  Slower heart rate.  Lower blood pressure.  Clearer skin.  Better breathing.  Fewer sick days. Quitting smoking can be hard. Do not give up if you fail the first time. Some people need to try a few times before they succeed. Do your best to stick to your quit plan, and talk with your doctor if you have any questions or concerns. Summary  Smoking tobacco is the leading cause of preventable death. Quitting smoking can be hard, but it is one of the best things that you can do for your health.  When you decide to quit smoking, make a plan to help you succeed.  Quit smoking right away, not slowly over a period of time.  When you start quitting, seek help from your doctor, family, or friends. This information is not intended to replace advice given to you by your health care provider. Make sure you discuss any questions you have with your health care provider. Document Revised: 09/23/2018 Document Reviewed: 03/19/2018 Elsevier Patient Education  2020 Elsevier Inc.  

## 2019-06-01 NOTE — Progress Notes (Signed)
[place], [place]  [address] Phone:  [phone]   Fax:  [phone]                                                                                                                                                                                                                                                                                                                                                                                                                                                                             New Patient Office Visit  Subjective:  Patient ID: Judy Meza, female    DOB: Aug 01, 1960  Age: 59 y.o. MRN: [id number]  CC:  Chief Complaint  Patient presents with  . New Patient (Initial Visit)    Est Care  . Hospitalization Follow-up    Discharged 05/11/2019 Multi med problems  . Referral    Stoma care    HPI Judy Meza presents establish care. She  has a past medical history of Cancer (Judy Meza), COPD (chronic obstructive pulmonary disease) (Judy Meza), Hypertension, Liver disease, and Renal disorder.   She is in today with her sister. She moved from [facility] April 10th. She was diagnosed with throat cancer May 2020.  She was treated with one dose of chemotherapy and 55 weeks of radiations.  She will need follow-up with oncology.  She is status post tracheostomy.  She is decannulated however having delayed healing with her stoma. She does continue to smoke.  She will be moving to her own apartment and this is a non-smoking unit.  Her sister is hopeful that she will be able to stop smoking. She was recently admitted to the hospital for acute hepatic encephalopathy.  She is currently on Rifaximin and lactulose and doing better.  She has some history of dementia is currently on Namenda.  Her sister feels like today her thought process has improved.   She is in need of referrals to several specialist.  They do have some concerns  because healthcare system is slightly different from [place].   She was recently diagnosed with chronic kidney disease and would like to see nephrologist.  She has not been seen by nephrologist however this was recommendation prior to her move.    Past Medical History:  Diagnosis Date  . Cancer ([place])   . COPD (chronic obstructive pulmonary disease) ([place])   . Hypertension   . Liver disease   . Renal disorder     Past Surgical History:  Procedure Laterality Date  . PORT-A-CATH REMOVAL      Family History  Problem Relation Age of Onset  . Healthy Mother   . Hypertension Other     Social History   Socioeconomic History  . Marital status: Single    Spouse name: Not on file  . Number of children: Not on file  . Years of education: Not on file  . Highest education level: Not on file  Occupational History  . Not on file  Tobacco Use  . Smoking status: Current Every Day Smoker    Packs/day: 0.25    Types: Cigarettes  . Smokeless tobacco: Never Used  Substance and Sexual Activity  . Alcohol use: Not Currently  . Drug use: Not Currently  . Sexual activity: Not Currently  Other Topics Concern  . Not on file  Social History Narrative  . Not on file   Social Determinants of Health   Financial Resource Strain:   . Difficulty of Paying Living Expenses:   Food Insecurity:   . Worried About Charity fundraiser in the Last Year:   . Arboriculturist in the Last Year:   Transportation Needs:   . Film/video editor (Medical):   Marland Kitchen Lack of Transportation (Non-Medical):   Physical Activity:   . Days of Exercise per Week:   . Minutes of Exercise per Session:   Stress:   . Feeling of Stress :   Social Connections:   . Frequency of Communication with Friends and Family:   . Frequency of Social Gatherings with Friends and Family:   . Attends Religious Services:   . Active Member of Clubs or Organizations:   . Attends Archivist Meetings:   Marland Kitchen Marital Status:     Intimate Partner Violence:   . Fear of Current or Ex-Partner:   . Emotionally Abused:   Marland Kitchen Physically Abused:   . Sexually Abused:     ROS Review of Systems  Objective:   Today's Vitals: BP (!) 146/93   Pulse 89   Temp 98 F (36.7 C)   Ht 5\' 2"  (1.575 m)   Wt 119 lb 12.8 oz (54.3 kg)   SpO2 100%   BMI 21.91 kg/m  Physical Exam Vitals reviewed. Exam conducted with a chaperone present.  HENT:     Head: Normocephalic.     Nose: Nose normal.     Mouth/Throat:     Pharynx: Oropharynx is clear.  Neck:     Comments: Tracheostomy stoma cover with quaze and tape  Talking via stoma Cardiovascular:     Rate and Rhythm: Normal rate and regular rhythm.     Pulses: Normal pulses.     Heart sounds: Normal heart sounds.  Pulmonary:     Effort: Pulmonary effort is normal.     Comments: Diminished Abdominal:     Palpations: Abdomen is soft.     Tenderness: There is no abdominal tenderness.  Musculoskeletal:        General: Normal range of motion.  Skin:    General: Skin is dry.  Neurological:     General: No focal deficit present.     Mental Status: She is alert and oriented to person, place, and time.  Psychiatric:        Mood and Affect: Mood normal.        Behavior: Behavior normal.        Thought Content: Thought content normal.        Judgment: Judgment normal.     Assessment & Plan:   Problem List Items Addressed This Visit      Unprioritized   AKI (acute kidney injury) ([place])   Relevant Orders   Ambulatory referral to Nephrology   Chronic hepatitis C without hepatic coma (HCC)   Dementia without behavioral disturbance (HCC)   Relevant Medications   hydrOXYzine (ATARAX/VISTARIL) 10 MG tablet   gabapentin (NEURONTIN) 250 MG/5ML solution   PARoxetine (PAXIL) 40 MG tablet   memantine (NAMENDA) 5 MG tablet   zolpidem (AMBIEN) 5 MG tablet   Other Relevant Orders   Ambulatory referral to Neurology    Other Visit Diagnoses    Healthcare maintenance    -   Primary   Relevant Orders   POCT glycosylated hemoglobin (Hb A1C) (Completed)   POCT urinalysis dipstick   POCT glucose (manual entry) (Completed)   Tobacco use disorder, continuous       Encourage she is NicoDerm patch as ordered   Throat cancer [facility])       Referral to oncology   Relevant Orders   Ambulatory referral to ENT   Ambulatory referral to Oncology   Essential hypertension       Retrial Norvasc 5 mg daily for elevated blood pressure   Relevant Medications   amLODipine (NORVASC) 5 MG tablet   Other insomnia       Relevant Medications   zolpidem (AMBIEN) 5 MG tablet   Anxiety       Relevant Medications   hydrOXYzine (ATARAX/VISTARIL) 10 MG tablet   PARoxetine (PAXIL) 40 MG tablet   Pruritus       Relevant Medications   hydrOXYzine (ATARAX/VISTARIL) 10 MG tablet      Outpatient Encounter Medications as of 06/01/2019  Medication Sig  . albuterol (VENTOLIN HFA) 108 (90 Base) MCG/ACT inhaler Inhale 2 puffs into the lungs every 6 (six) hours as needed for wheezing or shortness of breath.  . feeding supplement, ENSURE ENLIVE, (ENSURE ENLIVE) LIQD Take 237 mLs by mouth 2 (two) times daily between meals.  . furosemide (LASIX) 20 MG tablet Take 1 tablet (20 mg total) by mouth daily.  Marland Kitchen gabapentin (NEURONTIN) 250 MG/5ML solution Take 6 mLs (300 mg total) by mouth at bedtime.  Marland Kitchen  HYDROmorphone (DILAUDID) 2 MG tablet Take 2 mg by mouth every 4 (four) hours as needed for severe pain.  Marland Kitchen lactulose (CHRONULAC) 10 GM/15ML solution Take 30 mLs (20 g total) by mouth 3 (three) times daily.  Marland Kitchen loratadine (CLARITIN) 10 MG tablet Take 1 tablet (10 mg total) by mouth daily.  . magic mouthwash SOLN Take 5 mLs by mouth.  . memantine (NAMENDA) 5 MG tablet Take 1 tablet (5 mg total) by mouth 2 (two) times daily.  . nicotine (NICODERM CQ - DOSED IN MG/24 HOURS) 21 mg/24hr patch Place 1 patch (21 mg total) onto the skin daily.  . pantoprazole (PROTONIX) 40 MG tablet Take 40 mg by mouth daily.  Marland Kitchen  PARoxetine (PAXIL) 40 MG tablet Take 1 tablet (40 mg total) by mouth every morning.  . rifaximin (XIFAXAN) 550 MG TABS tablet Take 550 mg by mouth 2 (two) times daily.  . Sofosbuvir-Velpatasvir (EPCLUSA) 400-100 MG TABS Take 1 tablet by mouth daily.  Marland Kitchen spironolactone (ALDACTONE) 100 MG tablet Take 0.5 tablets (50 mg total) by mouth daily.  Marland Kitchen zolpidem (AMBIEN) 5 MG tablet Take 0.5 tablets (2.5 mg total) by mouth at bedtime as needed for sleep.  . [DISCONTINUED] albuterol (VENTOLIN HFA) 108 (90 Base) MCG/ACT inhaler Inhale 2 puffs into the lungs every 6 (six) hours as needed for wheezing or shortness of breath.  . [DISCONTINUED] gabapentin (NEURONTIN) 250 MG/5ML solution Take 300 mg by mouth at bedtime.  . [DISCONTINUED] lactulose (CHRONULAC) 10 GM/15ML solution Take 30 mLs (20 g total) by mouth 3 (three) times daily.  . [DISCONTINUED] loratadine (CLARITIN) 10 MG tablet Take 10 mg by mouth daily.  . [DISCONTINUED] memantine (NAMENDA) 5 MG tablet Take 5 mg by mouth 2 (two) times daily.  . [DISCONTINUED] PARoxetine (PAXIL) 40 MG tablet Take 40 mg by mouth every morning.  . [DISCONTINUED] zolpidem (AMBIEN) 5 MG tablet Take 2.5 mg by mouth at bedtime as needed for sleep.  Marland Kitchen albuterol (PROVENTIL) (2.5 MG/3ML) 0.083% nebulizer solution Take 3 mLs (2.5 mg total) by nebulization every 6 (six) hours as needed for wheezing or shortness of breath.  Marland Kitchen amLODipine (NORVASC) 5 MG tablet Take 1 tablet (5 mg total) by mouth daily.  . hydrOXYzine (ATARAX/VISTARIL) 10 MG tablet Take 1 tablet (10 mg total) by mouth 3 (three) times daily as needed for itching or anxiety.   No facility-administered encounter medications on file as of 06/01/2019.    Follow-up: Return in about 2 months (around 08/01/2019).   Vevelyn Francois, NP

## 2019-06-04 ENCOUNTER — Emergency Department (HOSPITAL_COMMUNITY): Payer: Medicaid Other

## 2019-06-04 ENCOUNTER — Inpatient Hospital Stay (HOSPITAL_COMMUNITY)
Admission: EM | Admit: 2019-06-04 | Discharge: 2019-06-07 | DRG: 442 | Disposition: A | Discharge status: Skilled Nursing Facility | Payer: Medicaid Other | Attending: Internal Medicine | Admitting: Internal Medicine

## 2019-06-04 ENCOUNTER — Other Ambulatory Visit: Payer: Self-pay

## 2019-06-04 ENCOUNTER — Encounter (HOSPITAL_COMMUNITY): Payer: Self-pay | Admitting: Emergency Medicine

## 2019-06-04 DIAGNOSIS — Z8249 Family history of ischemic heart disease and other diseases of the circulatory system: Secondary | ICD-10-CM

## 2019-06-04 DIAGNOSIS — R5381 Other malaise: Secondary | ICD-10-CM | POA: Diagnosis present

## 2019-06-04 DIAGNOSIS — N183 Chronic kidney disease, stage 3 unspecified: Secondary | ICD-10-CM

## 2019-06-04 DIAGNOSIS — Z720 Tobacco use: Secondary | ICD-10-CM | POA: Diagnosis not present

## 2019-06-04 DIAGNOSIS — K7200 Acute and subacute hepatic failure without coma: Principal | ICD-10-CM

## 2019-06-04 DIAGNOSIS — F17210 Nicotine dependence, cigarettes, uncomplicated: Secondary | ICD-10-CM | POA: Diagnosis present

## 2019-06-04 DIAGNOSIS — R278 Other lack of coordination: Secondary | ICD-10-CM | POA: Diagnosis present

## 2019-06-04 DIAGNOSIS — R443 Hallucinations, unspecified: Secondary | ICD-10-CM | POA: Diagnosis present

## 2019-06-04 DIAGNOSIS — F1021 Alcohol dependence, in remission: Secondary | ICD-10-CM | POA: Diagnosis not present

## 2019-06-04 DIAGNOSIS — C140 Malignant neoplasm of pharynx, unspecified: Secondary | ICD-10-CM

## 2019-06-04 DIAGNOSIS — J449 Chronic obstructive pulmonary disease, unspecified: Secondary | ICD-10-CM | POA: Diagnosis present

## 2019-06-04 DIAGNOSIS — I129 Hypertensive chronic kidney disease with stage 1 through stage 4 chronic kidney disease, or unspecified chronic kidney disease: Secondary | ICD-10-CM | POA: Diagnosis present

## 2019-06-04 DIAGNOSIS — K7031 Alcoholic cirrhosis of liver with ascites: Secondary | ICD-10-CM | POA: Diagnosis present

## 2019-06-04 DIAGNOSIS — R410 Disorientation, unspecified: Secondary | ICD-10-CM | POA: Diagnosis not present

## 2019-06-04 DIAGNOSIS — I4581 Long QT syndrome: Secondary | ICD-10-CM | POA: Diagnosis present

## 2019-06-04 DIAGNOSIS — Z85819 Personal history of malignant neoplasm of unspecified site of lip, oral cavity, and pharynx: Secondary | ICD-10-CM

## 2019-06-04 DIAGNOSIS — D696 Thrombocytopenia, unspecified: Secondary | ICD-10-CM | POA: Diagnosis present

## 2019-06-04 DIAGNOSIS — B182 Chronic viral hepatitis C: Secondary | ICD-10-CM | POA: Diagnosis present

## 2019-06-04 DIAGNOSIS — N1831 Chronic kidney disease, stage 3a: Secondary | ICD-10-CM | POA: Diagnosis present

## 2019-06-04 DIAGNOSIS — Z79899 Other long term (current) drug therapy: Secondary | ICD-10-CM

## 2019-06-04 DIAGNOSIS — Z66 Do not resuscitate: Secondary | ICD-10-CM | POA: Diagnosis present

## 2019-06-04 DIAGNOSIS — N179 Acute kidney failure, unspecified: Secondary | ICD-10-CM | POA: Diagnosis not present

## 2019-06-04 DIAGNOSIS — R079 Chest pain, unspecified: Secondary | ICD-10-CM | POA: Diagnosis present

## 2019-06-04 DIAGNOSIS — R9431 Abnormal electrocardiogram [ECG] [EKG]: Secondary | ICD-10-CM | POA: Diagnosis present

## 2019-06-04 DIAGNOSIS — Z20822 Contact with and (suspected) exposure to covid-19: Secondary | ICD-10-CM | POA: Diagnosis not present

## 2019-06-04 DIAGNOSIS — N184 Chronic kidney disease, stage 4 (severe): Secondary | ICD-10-CM | POA: Diagnosis present

## 2019-06-04 DIAGNOSIS — F0390 Unspecified dementia without behavioral disturbance: Secondary | ICD-10-CM | POA: Diagnosis present

## 2019-06-04 DIAGNOSIS — K7682 Hepatic encephalopathy: Secondary | ICD-10-CM | POA: Diagnosis present

## 2019-06-04 DIAGNOSIS — Z930 Tracheostomy status: Secondary | ICD-10-CM

## 2019-06-04 DIAGNOSIS — I10 Essential (primary) hypertension: Secondary | ICD-10-CM | POA: Diagnosis not present

## 2019-06-04 DIAGNOSIS — D638 Anemia in other chronic diseases classified elsewhere: Secondary | ICD-10-CM | POA: Diagnosis present

## 2019-06-04 DIAGNOSIS — G9340 Encephalopathy, unspecified: Secondary | ICD-10-CM | POA: Diagnosis present

## 2019-06-04 DIAGNOSIS — F05 Delirium due to known physiological condition: Secondary | ICD-10-CM | POA: Diagnosis present

## 2019-06-04 DIAGNOSIS — M549 Dorsalgia, unspecified: Secondary | ICD-10-CM | POA: Diagnosis present

## 2019-06-04 DIAGNOSIS — E860 Dehydration: Secondary | ICD-10-CM | POA: Diagnosis present

## 2019-06-04 DIAGNOSIS — Z9119 Patient's noncompliance with other medical treatment and regimen: Secondary | ICD-10-CM

## 2019-06-04 DIAGNOSIS — F1911 Other psychoactive substance abuse, in remission: Secondary | ICD-10-CM | POA: Diagnosis present

## 2019-06-04 LAB — PHOSPHORUS: Phosphorus: 3.6 mg/dL (ref 2.5–4.6)

## 2019-06-04 LAB — COMPREHENSIVE METABOLIC PANEL
ALT: 30 U/L (ref 0–44)
AST: 56 U/L — ABNORMAL HIGH (ref 15–41)
Albumin: 3.6 g/dL (ref 3.5–5.0)
Alkaline Phosphatase: 103 U/L (ref 38–126)
Anion gap: 11 (ref 5–15)
BUN: 38 mg/dL — ABNORMAL HIGH (ref 6–20)
CO2: 20 mmol/L — ABNORMAL LOW (ref 22–32)
Calcium: 9.3 mg/dL (ref 8.9–10.3)
Chloride: 101 mmol/L (ref 98–111)
Creatinine, Ser: 2.47 mg/dL — ABNORMAL HIGH (ref 0.44–1.00)
GFR calc Af Amer: 24 mL/min — ABNORMAL LOW (ref >60)
GFR calc non Af Amer: 21 mL/min — ABNORMAL LOW (ref >60)
Glucose, Bld: 113 mg/dL — ABNORMAL HIGH (ref 70–99)
Potassium: 4.2 mmol/L (ref 3.5–5.1)
Sodium: 132 mmol/L — ABNORMAL LOW (ref 135–145)
Total Bilirubin: 1.9 mg/dL — ABNORMAL HIGH (ref 0.3–1.2)
Total Protein: 8 g/dL (ref 6.5–8.1)

## 2019-06-04 LAB — CK: Total CK: 138 U/L (ref 38–234)

## 2019-06-04 LAB — URINALYSIS, ROUTINE W REFLEX MICROSCOPIC
Bilirubin Urine: NEGATIVE
Glucose, UA: NEGATIVE mg/dL
Ketones, ur: NEGATIVE mg/dL
Nitrite: NEGATIVE
Protein, ur: NEGATIVE mg/dL
Specific Gravity, Urine: 1.01 (ref 1.005–1.030)
pH: 6 (ref 5.0–8.0)

## 2019-06-04 LAB — RAPID URINE DRUG SCREEN, HOSP PERFORMED
Amphetamines: NOT DETECTED
Barbiturates: NOT DETECTED
Benzodiazepines: NOT DETECTED
Cocaine: NOT DETECTED
Opiates: NOT DETECTED
Tetrahydrocannabinol: NOT DETECTED

## 2019-06-04 LAB — SARS CORONAVIRUS 2 BY RT PCR (HOSPITAL ORDER, PERFORMED IN ~~LOC~~ HOSPITAL LAB): SARS Coronavirus 2: NEGATIVE

## 2019-06-04 LAB — CBC
HCT: 28.6 % — ABNORMAL LOW (ref 36.0–46.0)
Hemoglobin: 9.5 g/dL — ABNORMAL LOW (ref 12.0–15.0)
MCH: 32.6 pg (ref 26.0–34.0)
MCHC: 33.2 g/dL (ref 30.0–36.0)
MCV: 98.3 fL (ref 80.0–100.0)
Platelets: 84 10*3/uL — ABNORMAL LOW (ref 150–400)
RBC: 2.91 MIL/uL — ABNORMAL LOW (ref 3.87–5.11)
RDW: 16.5 % — ABNORMAL HIGH (ref 11.5–15.5)
WBC: 5 10*3/uL (ref 4.0–10.5)
nRBC: 0 % (ref 0.0–0.2)

## 2019-06-04 LAB — PROTIME-INR
INR: 1.5 — ABNORMAL HIGH (ref 0.8–1.2)
Prothrombin Time: 17.7 seconds — ABNORMAL HIGH (ref 11.4–15.2)

## 2019-06-04 LAB — LACTIC ACID, PLASMA: Lactic Acid, Venous: 1.5 mmol/L (ref 0.5–1.9)

## 2019-06-04 LAB — ETHANOL: Alcohol, Ethyl (B): <10 mg/dL (ref <10)

## 2019-06-04 LAB — AMMONIA: Ammonia: 64 umol/L — ABNORMAL HIGH (ref 9–35)

## 2019-06-04 LAB — TROPONIN I (HIGH SENSITIVITY)
Troponin I (High Sensitivity): 5 ng/L (ref <18)
Troponin I (High Sensitivity): 6 ng/L (ref <18)

## 2019-06-04 LAB — MAGNESIUM: Magnesium: 1.9 mg/dL (ref 1.7–2.4)

## 2019-06-04 MED ORDER — LORAZEPAM 2 MG/ML IJ SOLN
1.0000 mg | Freq: Once | INTRAMUSCULAR | Status: AC
  Administered 2019-06-04: 1 mg via INTRAVENOUS
  Filled 2019-06-04: qty 1

## 2019-06-04 MED ORDER — LACTULOSE 10 GM/15ML PO SOLN
30.0000 g | Freq: Once | ORAL | Status: AC
  Administered 2019-06-04: 30 g via ORAL
  Filled 2019-06-04: qty 60

## 2019-06-04 MED ORDER — SODIUM CHLORIDE 0.9 % IV BOLUS
1000.0000 mL | Freq: Once | INTRAVENOUS | Status: AC
  Administered 2019-06-04: 1000 mL via INTRAVENOUS

## 2019-06-04 NOTE — ED Provider Notes (Signed)
[facility] DEPT Provider Note   CSN: [id number] Arrival date & time: 06/04/19  1502     History Chief Complaint  Patient presents with  . Altered Mental Status    Judy Meza is a 59 y.o. female.  HPI   Patient presents with reported 3-day worsening mental status and coordination reported by her sister.  Most of the family's medical history was in [place], moved down here about a year ago and have been attempting to manage the patient's chronic cirrhosis and other medical problems here.  Patient recently moved out of the sister's house and is finally living alone, sister has concerns that she is not been able to manage this appropriately and still has to go over to her house to administer medications.  Her sister reports that she did take all of her medications appropriately yesterday, but when then she arrived to the house this morning to give her her meds the patient was too disoriented to open up doors appropriately was hallucinating about an out-of-town sister being at the house this morning and had significant asterixis which the sister knows is a sign of elevated ammonia.  Her sister does believe that there is no known trauma, her sister says that the patient has not been sick lately, her sister reports that there is a known history of drug abuse but does not think that her sisters had any avenue to gain substances at the house lately.  Sister says there is no alcohol in the house and the patient does smoke 5 or 6 times per day.  Of note her sister administered lactulose at the house, she says that in the emergency department during her exam her sister is behaving more alert than she was at home and by her sisters There have been 2 bowel movements so far today.  Sister does confirm that the patient does not have a legal guardian and is their own decision maker  Past Medical History:  Diagnosis Date  . Cancer ([place])   . COPD (chronic obstructive  pulmonary disease) ([place])   . Hypertension   . Liver disease   . Renal disorder     Patient Active Problem List   Diagnosis Date Noted  . Metabolic acidosis 32/44/0102  . Dementia without behavioral disturbance ([place]) 05/10/2019  . Chronic hepatitis C without hepatic coma ([place]) 05/10/2019  . Hepatic encephalopathy ([place]) 05/09/2019  . Pancytopenia ([place])   . Cirrhosis of liver ([place])   . CKD (chronic kidney disease), stage III 04/29/2019  . COPD with chronic bronchitis ([place]) 04/29/2019  . Tobacco abuse 04/29/2019  . Alcoholism in remission ([place]) 04/29/2019  . Acute hepatic encephalopathy 04/29/2019  . AKI (acute kidney injury) ([place]) 04/29/2019  . Alcoholic cirrhosis of liver with ascites ([place]) 04/29/2019  . Thrombocytopenia ([place]) 04/29/2019  . Hyperkalemia 04/29/2019    Past Surgical History:  Procedure Laterality Date  . PORT-A-CATH REMOVAL       OB History    Gravida  2   Para      Term      Preterm      AB      Living  1     SAB      TAB      Ectopic      Multiple      Live Births              Family History  Problem Relation Age of Onset  . Healthy Mother   . Hypertension  Other     Social History   Tobacco Use  . Smoking status: Current Every Day Smoker    Packs/day: 0.25    Types: Cigarettes  . Smokeless tobacco: Never Used  . Tobacco comment: Willing to work on smoking cessation  Substance Use Topics  . Alcohol use: Not Currently  . Drug use: Not Currently    Home Medications Prior to Admission medications   Medication Sig Start Date End Date Taking? Authorizing Provider  albuterol (PROVENTIL) (2.5 MG/3ML) 0.083% nebulizer solution Take 3 mLs (2.5 mg total) by nebulization every 6 (six) hours as needed for wheezing or shortness of breath. 06/01/19  Yes Vevelyn Francois, NP  albuterol (VENTOLIN HFA) 108 (90 Base) MCG/ACT inhaler Inhale 2 puffs into the lungs every 6 (six) hours as needed for wheezing or shortness of breath. 06/01/19  08/30/19 Yes King, Diona Foley, NP  amLODipine (NORVASC) 5 MG tablet Take 1 tablet (5 mg total) by mouth daily. 06/01/19  Yes King, Diona Foley, NP  feeding supplement, ENSURE ENLIVE, (ENSURE ENLIVE) LIQD Take 237 mLs by mouth 2 (two) times daily between meals. 05/02/19  Yes Eugenie Filler, MD  furosemide (LASIX) 20 MG tablet Take 1 tablet (20 mg total) by mouth daily. 05/04/19  Yes Eugenie Filler, MD  gabapentin (NEURONTIN) 250 MG/5ML solution Take 6 mLs (300 mg total) by mouth at bedtime. 06/01/19 07/01/19 Yes King, Diona Foley, NP  hydroxypropyl methylcellulose / hypromellose (ISOPTO TEARS / GONIOVISC) 2.5 % ophthalmic solution Place 1 drop into both eyes 4 (four) times daily as needed for dry eyes.   Yes [provider]  hydrOXYzine (ATARAX/VISTARIL) 10 MG tablet Take 1 tablet (10 mg total) by mouth 3 (three) times daily as needed for itching or anxiety. 06/01/19 11/19/21 Yes King, Diona Foley, NP  lactulose (CHRONULAC) 10 GM/15ML solution Take 30 mLs (20 g total) by mouth 3 (three) times daily. 06/01/19  Yes Vevelyn Francois, NP  loratadine (CLARITIN) 10 MG tablet Take 1 tablet (10 mg total) by mouth daily. 06/01/19 08/30/19 Yes Vevelyn Francois, NP  Multiple Vitamin (MULTIVITAMIN WITH MINERALS) TABS tablet Take 1 tablet by mouth daily.   Yes [provider]  nicotine (NICODERM CQ - DOSED IN MG/24 HOURS) 21 mg/24hr patch Place 1 patch (21 mg total) onto the skin daily. 05/03/19  Yes Eugenie Filler, MD  pantoprazole (PROTONIX) 40 MG tablet Take 40 mg by mouth daily.   Yes [provider]  PARoxetine (PAXIL) 40 MG tablet Take 1 tablet (40 mg total) by mouth every morning. 06/01/19 08/30/19 Yes Vevelyn Francois, NP  rifaximin (XIFAXAN) 550 MG TABS tablet Take 550 mg by mouth 2 (two) times daily.   Yes [provider]  Sofosbuvir-Velpatasvir (EPCLUSA) 400-100 MG TABS Take 1 tablet by mouth daily. 05/24/19  Yes Comer, Okey Regal, MD  spironolactone (ALDACTONE) 100 MG tablet Take  0.5 tablets (50 mg total) by mouth daily. Patient taking differently: Take 100 mg by mouth daily.  05/11/19  Yes Oretha Milch D, MD  zolpidem (AMBIEN) 5 MG tablet Take 0.5 tablets (2.5 mg total) by mouth at bedtime as needed for sleep. 06/01/19 08/30/19 Yes King, Diona Foley, NP  memantine (NAMENDA) 5 MG tablet Take 1 tablet (5 mg total) by mouth 2 (two) times daily. Patient taking differently: Take 10 mg by mouth 2 (two) times daily.  06/01/19 08/30/19  Vevelyn Francois, NP    Allergies    Patient has no known allergies.  Review of  Systems   Review of Systems  Constitutional: Positive for activity change. Negative for appetite change, chills, diaphoresis, fatigue and fever.  HENT: Negative.   Eyes: Negative.   Respiratory: Negative.        Tracheostomy present  Cardiovascular: Negative.   Gastrointestinal: Positive for abdominal distention and constipation. Negative for abdominal pain, anal bleeding, blood in stool, diarrhea, nausea, rectal pain and vomiting.       Moderate ascites, no indication of SBP  Genitourinary: Negative.   Musculoskeletal: Negative.   Psychiatric/Behavioral: Positive for confusion and hallucinations. Negative for agitation, behavioral problems, decreased concentration, dysphoric mood, self-injury, sleep disturbance and suicidal ideas. The patient is not nervous/anxious and is not hyperactive.     Physical Exam Updated Vital Signs BP 103/82   Pulse 96   Temp 98.1 F (36.7 C) (Oral)   Resp (!) 23   SpO2 100%   Physical Exam Vitals and nursing note reviewed.  Constitutional:      General: She is not in acute distress.    Appearance: She is normal weight. She is ill-appearing. She is not toxic-appearing or diaphoretic.     Comments: Drowsy but briefly arousable  HENT:     Head: Normocephalic.     Mouth/Throat:     Mouth: Mucous membranes are moist.  Eyes:     General: Scleral icterus present.  Neck:     Comments: Range of motion intact,  tracheostomy Cardiovascular:     Rate and Rhythm: Normal rate and regular rhythm.     Pulses: Normal pulses.     Comments: Mild bilateral pedal edema Pulmonary:     Effort: Pulmonary effort is normal.  Abdominal:     General: There is distension.     Tenderness: There is no abdominal tenderness. There is no guarding.     Comments: Abdominal ascites  Skin:    General: Skin is warm.     Capillary Refill: Capillary refill takes less than 2 seconds.  Neurological:     General: No focal deficit present.     Mental Status: She is disoriented.  Psychiatric:     Comments: Drowsy but arousable, confused but does attempt to comply with instruction     ED Results / Procedures / Treatments   Labs (all labs ordered are listed, but only abnormal results are displayed) Labs Reviewed  COMPREHENSIVE METABOLIC PANEL - Abnormal; Notable for the following components:      Result Value   Sodium 132 (*)    CO2 20 (*)    Glucose, Bld 113 (*)    BUN 38 (*)    Creatinine, Ser 2.47 (*)    AST 56 (*)    Total Bilirubin 1.9 (*)    GFR calc non Af Amer 21 (*)    GFR calc Af Amer 24 (*)    All other components within normal limits  CBC - Abnormal; Notable for the following components:   RBC 2.91 (*)    Hemoglobin 9.5 (*)    HCT 28.6 (*)    RDW 16.5 (*)    Platelets 84 (*)    All other components within normal limits  AMMONIA - Abnormal; Notable for the following components:   Ammonia 64 (*)    All other components within normal limits  SARS CORONAVIRUS 2 BY RT PCR (HOSPITAL ORDER, [facility])  ETHANOL  RAPID URINE DRUG SCREEN, HOSP PERFORMED  URINALYSIS, ROUTINE W REFLEX MICROSCOPIC  PROTIME-INR  CBG MONITORING, ED  I-STAT BETA  HCG BLOOD, ED ([facility], [facility], [facility] ONLY)  TROPONIN I (HIGH SENSITIVITY)    EKG EKG Interpretation  Date/Time:  Sunday Jun 04 2019 18:02:41 EDT Ventricular Rate:  95 PR Interval:    QRS Duration: 78 QT Interval:  407 QTC Calculation: 512 R  Axis:   75 Text Interpretation: Sinus rhythm Nonspecific T abnormalities, lateral leads Prolonged QT interval No previous ECGs available Confirmed by Wandra Arthurs ([id number]) on 06/04/2019 6:07:55 PM   Radiology DG Chest Port 1 View  Result Date: 06/04/2019 CLINICAL DATA:  Chest pain. EXAM: PORTABLE CHEST 1 VIEW COMPARISON:  April 29, 2019 FINDINGS: Multiple overlying radiopaque cardiac lead wires are seen. There is no evidence of acute infiltrate, pleural effusion or pneumothorax. The heart size and mediastinal contours are within normal limits. The visualized skeletal structures are unremarkable. IMPRESSION: No active disease. Electronically Signed   By: Virgina Norfolk M.D.   On: 06/04/2019 18:13    Procedures Procedures (including critical care time)  Medications Ordered in ED Medications  lactulose (CHRONULAC) 10 GM/15ML solution 30 g (has no administration in time range)  sodium chloride 0.9 % bolus 1,000 mL (0 mLs Intravenous Stopped 06/04/19 1824)    ED Course  I have reviewed the triage vital signs and the nursing notes.  Pertinent labs & imaging results that were available during my care of the patient were reviewed by me and considered in my medical decision making (see chart for details).    MDM Rules/Calculators/A&P                      Given story medical history, most likely cause is hepatic encephalopathy.  We will draw initial lab work to verify this and monitor for indications of other etiology.  Patient is vital signs stable  6pm: Patient started reporting left-sided chest and back pain, difficulty verbalize given mental status and tracheostomy.  Was tachypneic to the mid 30s intermittently while saturating in mid 90s and heart rate did appear to be in sinus rhythm, EKG/troponin/chest x-ray ordered.  6:30pm pain resolving, chest x-ray negative, EKG negative, called hospitalist for admission for AKI   Final Clinical Impression(s) / ED Diagnoses Final diagnoses:  AKI  (acute kidney injury) [facility])    Rx / [facility] Orders ED Discharge Orders    None       Sherene Sires, DO 06/04/19 1856    Drenda Freeze, MD 06/05/19 [phone]

## 2019-06-04 NOTE — ED Triage Notes (Signed)
Patient here from home with complaints of confusion due to elevated ammonia level. Cirrhosis, late on meds. Visitor eports that she lives alone and didn't get to take meds until 1:20 today.

## 2019-06-04 NOTE — H&P (Signed)
Judy Meza ZTI:[id number] DOB: 1960-04-08 DOA: 06/04/2019     PCP: Patient, No Pcp Per   Outpatient Specialists:    . NEphrology:  [facility]  ID Dr. Linus Salmons [facility] Will need to follow up may have appointment in May    Patient arrived to ER on 06/04/19 at 1502  Patient coming from: home Lives alone,     With family visiting   Chief Complaint:   Chief Complaint  Patient presents with  . Altered Mental Status    HPI: Judy Meza is a 59 y.o. female with medical history significant of Cirrhosis, history of alcohol abuse and drug abuse IV drug use, COPD, hypertension, dementia, CKD stage III, tobacco abuse ongoing, status post tracheostomy history of throat cancer in May 2020, hepatitis C    Presented with   acute encephalopathy starting today.  Patient originally from [place] but has moved to [place] with her family for the past 1 year she has been residing with her family up until 3 days ago when she started to leave on her own.  Family used to visit every day to help administer medications the last time seen her yesterday she was doing well she took some night medicines this morning sister checked on her but patient was too confused had having trouble opening the door.  Sister noted that she had asterixis which she knows in the past worse explained by elevated ammonia.  Family is adamant that patient have not been abusing drugs or drinking. Reports she was having hallucinations. Sister gave her lactulose and took her to emergency department Patient have had 2 bowel movements since then and there has been some clearing in her mentation.  She apparently have not had her morning dose until 1:20 PM today family states they run out of Xifaxan few days ago Family reports she burned her gown when cooking since she has been living by herself for the past 3 days Patient refused to be placed and insists on living alone Family is very concerned that she is unsafe at home by  herself She is at risk of being lost They put a medical bracelet on her to try to prevent this from happening Family would like her to be assessed for safety.  Her family has been extremely caring and supportive.   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 and hepatitis C end-stage Known history of mild varices and ascites had required paracentesis in the past History of prior admission for hepatic encephalopathy  on  Lasix, lactulose Protonix Xifaxan, Spironolactone  History of hepatitis C on Epclusa  History of dementia on Namenda  History of throat cancer status post tracheostomy decannulated with delayed healing of her stoma continues to smoke sp radiation therapy    Hx of IV drug use - currently in remission for 5 years      COPD - not followed by pulmonology not on baseline oxygen  *L,     Anemia of chronic disease chronic baseline hemoglobin around 9.5-10   CKD stage III - baseline Cr 1.2-1.4 Lab Results  Component Value Date  CREATININE 2.47 (H) 06/04/2019   CREATININE 1.26 (H) 05/11/2019   CREATININE 1.40 (H) 05/10/2019    While in ER: Noted to have ammonia up to 60 evidence of persistent hepatic encephalopathy As well as AKI with creatinine up to 2.47 On emergency department patient endorsed chest pain and back pain chest x-ray unremarkable troponin unremarkable    Hospitalist was called for admission for hepatic encephalopathy  The following Work up has been ordered so far:  Orders Placed This Encounter  Procedures  . SARS Coronavirus 2 by RT PCR (hospital order, performed in [facility] hospital lab) Nasopharyngeal  Nasopharyngeal Swab  . DG Chest Port 1 View  . CT Head Wo Contrast  . Comprehensive metabolic panel  . CBC  . Ammonia  . Ethanol  . Rapid urine drug screen (hospital performed)  . Urinalysis, Routine w reflex microscopic  . Protime-INR  . Diet NPO time specified  . Cardiac monitoring  . [place] Lock IV, Maintain IV access  . Consult to hospitalist  ALL PATIENTS BEING ADMITTED/HAVING PROCEDURES NEED COVID-19 SCREENING [id number]  . Pulse oximetry, continuous  . CBG monitoring, ED  . I-Stat beta hCG blood, ED  . EKG 12-Lead  . EKG 12-Lead    Following Medications were ordered in ER: Medications  lactulose (CHRONULAC) 10 GM/15ML solution 30 g (has no administration in time range)  sodium chloride 0.9 % bolus 1,000 mL (0 mLs Intravenous Stopped 06/04/19 1824)        Consult Orders  (From admission, onward)         Start     Ordered   06/04/19 1818  Consult to hospitalist  ALL PATIENTS BEING ADMITTED/HAVING PROCEDURES NEED COVID-19 SCREENING [id number]  Once    Comments: ALL PATIENTS BEING ADMITTED/HAVING PROCEDURES NEED COVID-19 SCREENING [id number]  Provider:  (Not yet assigned)  Question Answer Comment  Place call to: [facility]   Reason for Consult Admit      06/04/19 1818           Significant initial  Findings: Abnormal Labs Reviewed  COMPREHENSIVE METABOLIC PANEL - Abnormal; Notable for the following components:      Result Value   Sodium 132 (*)    CO2 20 (*)    Glucose, Bld 113 (*)    BUN 38 (*)    Creatinine, Ser 2.47 (*)    AST 56 (*)    Total Bilirubin 1.9 (*)    GFR calc non Af Amer 21 (*)    GFR calc Af Amer 24 (*)    All other components within normal limits  CBC - Abnormal; Notable for the following components:   RBC 2.91 (*)    Hemoglobin 9.5 (*)    HCT 28.6 (*)    RDW 16.5 (*)    Platelets 84 (*)    All other components within normal limits  AMMONIA - Abnormal; Notable for the following components:   Ammonia 64 (*)    All other  components within normal limits     Otherwise labs showing:    Recent Labs  Lab 06/04/19 1702  NA 132*  K 4.2  CO2 20*  GLUCOSE 113*  BUN 38*  CREATININE 2.47*  CALCIUM 9.3    Cr    Up from baseline see below Lab Results  Component Value Date   CREATININE 2.47 (H) 06/04/2019   CREATININE 1.26 (H) 05/11/2019   CREATININE 1.40 (H) 05/10/2019    Recent Labs  Lab 06/04/19 1702  AST 56*  ALT 30  ALKPHOS 103  BILITOT 1.9*  PROT 8.0  ALBUMIN 3.6   Lab Results  Component Value Date   CALCIUM 9.3 06/04/2019   PHOS 4.0 04/30/2019     WBC      Component Value Date/Time   WBC 5.0 06/04/2019 1702   ANC    Component Value Date/Time   NEUTROABS 1.7 05/01/2019 0525   ALC No components found for: LYMPHAB    Plt: Lab Results  Component Value Date   PLT 84 (L) 06/04/2019        COVID-19 Labs  No results for input(s): DDIMER, FERRITIN, LDH, CRP in the last 72 hours.  Lab Results  Component Value Date   SARSCOV2NAA NEGATIVE 05/09/2019   [place] NEGATIVE 04/29/2019       HG/HCT  Stable,     Component Value Date/Time   HGB 9.5 (L) 06/04/2019 1702   HCT 28.6 (L) 06/04/2019 1702    No results for input(s): LIPASE, AMYLASE in the last 168 hours. Recent Labs  Lab 06/04/19 1605  AMMONIA 64*    No components found for: LABALBU   Troponin 5-6 Cardiac Panel (last 3 results) Recent Labs    06/04/19 2017  CKTOTAL 138       ECG: Ordered Personally reviewed by me showing: HR : 95 Rhythm:  NSR   nonspecific changes,  QTC 512   BNP (last 3 results) Recent Labs    04/30/19 0553  BNP 21.4    ProBNP (last 3 results) No results for input(s): PROBNP in the last 8760 hours.  DM  labs:  HbA1C: Recent Labs    06/01/19 0923  HGBA1C 5.1       CBG (last 3)  No results for input(s): GLUCAP in the last 72 hours.     UA   no evidence of UTI   hazy   Urine analysis:    Component Value Date/Time   COLORURINE YELLOW 06/04/2019 1802    APPEARANCEUR HAZY (A) 06/04/2019 1802   LABSPEC 1.010 06/04/2019 1802   PHURINE 6.0 06/04/2019 1802   GLUCOSEU NEGATIVE 06/04/2019 1802   HGBUR SMALL (A) 06/04/2019 1802   BILIRUBINUR NEGATIVE 06/04/2019 1802   KETONESUR NEGATIVE 06/04/2019 1802   PROTEINUR NEGATIVE 06/04/2019 1802   NITRITE NEGATIVE 06/04/2019 1802   LEUKOCYTESUR SMALL (A) 06/04/2019 1802    Ordered  CT HEAD NON acute  CXR - NON acute     ED Triage Vitals  Enc Vitals Group     BP 06/04/19 1524 122/75     Pulse Rate 06/04/19 1524 96     Resp 06/04/19 1524 18     Temp 06/04/19 1524 98.1 F (36.7 C)     Temp Source 06/04/19 1524 Oral     SpO2 06/04/19 1524 100 %     Weight --      Height --      Head Circumference --      Peak Flow --      Pain Score 06/04/19 1530 0     Pain Loc --      Pain Edu? --      Excl. in [place]? --   TMAX(24)@       Latest  Blood pressure 103/82, pulse 96, temperature 98.1 F (36.7 C), temperature source Oral, resp. rate (!) 23, SpO2 100 %.    Review of Systems:    Pertinent positives include:   jaundice confusion Constitutional:  No weight loss, night sweats, Fevers, chills, fatigue, weight loss  HEENT:  No headaches, Difficulty swallowing,Tooth/dental problems,Sore throat,  No sneezing, itching, ear ache, nasal congestion, post nasal drip,  Cardio-vascular:  No chest pain, Orthopnea, PND, anasarca, dizziness, palpitations.no Bilateral lower extremity swelling  GI:  No heartburn, indigestion, abdominal pain, nausea, vomiting, diarrhea, change in bowel habits, loss of appetite, melena, blood in stool, hematemesis Resp:  no shortness of breath at rest. No dyspnea on exertion, No excess mucus, no productive cough, No non-productive cough, No coughing up of blood.No change in color of mucus.No wheezing. Skin:  no rash or lesions. No GU:  no dysuria, change in color of urine, no urgency or frequency. No straining to urinate.  No flank pain.  Musculoskeletal:  No joint pain  or no joint swelling. No decreased range of motion. No back pain.  Psych:  No change in mood or affect. No depression or anxiety. No memory loss.  Neuro: no localizing neurological complaints, no tingling, no weakness, no double vision, no gait abnormality, no slurred speech, no   All systems reviewed and apart from [place] all are negative  Past Medical History:   Past Medical History:  Diagnosis Date  . Cancer ([place])   . COPD (chronic obstructive pulmonary disease) ([place])   . Hypertension   . Liver disease   . Renal disorder       Past Surgical History:  Procedure Laterality Date  . PORT-A-CATH REMOVAL      Social History:  Ambulatory  independently       reports that she has been smoking cigarettes. She has been smoking about 0.25 packs per day. She has never used smokeless tobacco. She reports previous alcohol use. She reports previous drug use.    Family History:   Family History  Problem Relation Age of Onset  . Healthy Mother   . Hypertension Other     Allergies: No Known Allergies   Prior to Admission medications   Medication Sig Start Date End Date Taking? Authorizing Provider  albuterol (PROVENTIL) (2.5 MG/3ML) 0.083% nebulizer solution Take 3 mLs (2.5 mg total) by nebulization every 6 (six) hours as needed for wheezing or shortness of breath. 06/01/19  Yes Vevelyn Francois, NP  albuterol (VENTOLIN HFA) 108 (90 Base) MCG/ACT inhaler Inhale 2 puffs into the lungs every 6 (six) hours as needed for wheezing or shortness of breath. 06/01/19 08/30/19 Yes King, Diona Foley, NP  amLODipine (NORVASC) 5 MG tablet Take 1 tablet (5 mg total) by mouth daily. 06/01/19  Yes King, Diona Foley, NP  feeding supplement, ENSURE ENLIVE, (ENSURE ENLIVE) LIQD Take 237 mLs by mouth 2 (two) times daily between meals. 05/02/19  Yes Eugenie Filler, MD  furosemide (LASIX) 20 MG tablet Take 1 tablet (20 mg total) by mouth daily. 05/04/19  Yes Eugenie Filler, MD  gabapentin (NEURONTIN) 250  MG/5ML solution Take 6 mLs (300 mg total) by mouth at bedtime. 06/01/19 07/01/19 Yes King, Diona Foley, NP  hydroxypropyl methylcellulose / hypromellose (ISOPTO TEARS / GONIOVISC) 2.5 % ophthalmic solution Place 1 drop into both eyes 4 (four) times daily as needed for dry eyes.   Yes [provider]  hydrOXYzine (ATARAX/VISTARIL) 10 MG tablet Take 1 tablet (10 mg total) by mouth 3 (three) times daily as needed for itching or anxiety. 06/01/19 11/19/21 Yes King, Diona Foley, NP  lactulose (CHRONULAC) 10 GM/15ML solution Take 30 mLs (20 g total) by mouth 3 (three) times daily. 06/01/19  Yes Vevelyn Francois, NP  loratadine (CLARITIN) 10 MG tablet Take 1 tablet (  10 mg total) by mouth daily. 06/01/19 08/30/19 Yes Vevelyn Francois, NP  Multiple Vitamin (MULTIVITAMIN WITH MINERALS) TABS tablet Take 1 tablet by mouth daily.   Yes [provider]  nicotine (NICODERM CQ - DOSED IN MG/24 HOURS) 21 mg/24hr patch Place 1 patch (21 mg total) onto the skin daily. 05/03/19  Yes Eugenie Filler, MD  pantoprazole (PROTONIX) 40 MG tablet Take 40 mg by mouth daily.   Yes [provider]  PARoxetine (PAXIL) 40 MG tablet Take 1 tablet (40 mg total) by mouth every morning. 06/01/19 08/30/19 Yes Vevelyn Francois, NP  rifaximin (XIFAXAN) 550 MG TABS tablet Take 550 mg by mouth 2 (two) times daily.   Yes [provider]  Sofosbuvir-Velpatasvir (EPCLUSA) 400-100 MG TABS Take 1 tablet by mouth daily. 05/24/19  Yes Comer, Okey Regal, MD  spironolactone (ALDACTONE) 100 MG tablet Take 0.5 tablets (50 mg total) by mouth daily. Patient taking differently: Take 100 mg by mouth daily.  05/11/19  Yes Oretha Milch D, MD  zolpidem (AMBIEN) 5 MG tablet Take 0.5 tablets (2.5 mg total) by mouth at bedtime as needed for sleep. 06/01/19 08/30/19 Yes King, Diona Foley, NP  memantine (NAMENDA) 5 MG tablet Take 1 tablet (5 mg total) by mouth 2 (two) times daily. Patient taking differently: Take 10 mg by mouth 2 (two) times daily.   06/01/19 08/30/19  Vevelyn Francois, NP   Physical Exam: Blood pressure 103/82, pulse 96, temperature 98.1 F (36.7 C), temperature source Oral, resp. rate (!) 23, SpO2 100 %. 1. General:  in No Acute distress   Chronically ill -appearing 2. Psychological: Alert and   Oriented to self only 3. Head/ENT:   Dry Mucous Membranes                          Head Non traumatic, neck supple                            Poor Dentition 4. SKIN:  decreased Skin turgor,  Skin clean Dry and intact no rash 5. Heart: Regular rate and rhythm no  Murmur, no Rub or gallop 6. Lungs: no wheezes or crackles   7. Abdomen: Soft,  non-tender, Non distended  bowel sounds present 8. Lower extremities: no clubbing, cyanosis, no edema 9. Neurologically Grossly intact, moving all 4 extremities equally  No asterexis 10. MSK: Normal range of motion   All other LABS:     Recent Labs  Lab 06/04/19 1702  WBC 5.0  HGB 9.5*  HCT 28.6*  MCV 98.3  PLT 84*     Recent Labs  Lab 06/04/19 1702  NA 132*  K 4.2  CL 101  CO2 20*  GLUCOSE 113*  BUN 38*  CREATININE 2.47*  CALCIUM 9.3     Recent Labs  Lab 06/04/19 1702  AST 56*  ALT 30  ALKPHOS 103  BILITOT 1.9*  PROT 8.0  ALBUMIN 3.6       Cultures: No results found for: SDES, SPECREQUEST, CULT, REPTSTATUS   Radiological Exams on Admission: DG Chest Port 1 View  Result Date: 06/04/2019 CLINICAL DATA:  Chest pain. EXAM: PORTABLE CHEST 1 VIEW COMPARISON:  April 29, 2019 FINDINGS: Multiple overlying radiopaque cardiac lead wires are seen. There is no evidence of acute infiltrate, pleural effusion or pneumothorax. The heart size and mediastinal contours are within normal limits. The visualized skeletal structures are unremarkable. IMPRESSION:  No active disease. Electronically Signed   By: Virgina Norfolk M.D.   On: 06/04/2019 18:13    Chart has been reviewed    Assessment/Plan  59 y.o. female with medical history significant of Cirrhosis, history  of alcohol abuse and drug abuse IV drug use, COPD, hypertension, dementia, CKD stage III, tobacco abuse ongoing, status post tracheostomy history of throat cancer in May 2020, hepatitis C     Admitted for hepatic encephalopathy  Present on Admission:   . AKI (acute kidney injury) ([place]) most likely secondary to diminished p.o. intake we will gently rehydrate hold spironolactone Lasix  for tonight  Will rehydratre Obtain urine lytes  . Acute hepatic encephalopathy -likely due to not taking home meds restart lactulose pt may not be fully compliant,  restart Xifaxan family has run out check ammonia level in the morning continue to monitor.   . CKD (chronic kidney disease), stage III  -  at baseline her creatinine is close to 1.2 likely currently AKI.  We will gently rehydrate avoid nephrotoxic medications  . COPD with chronic bronchitis ([facility]) -chronic stable at baseline not on oxygen continue home medications when QTC improves  . Tobacco abuse -   Spoke about importance of quitting spent 5 minutes discussing options for treatment, prior attempts at quitting, and dangers of smoking  -At this point patient is not ready to quit but working on it  - order nicotine patch   - nursing tobacco cessation protocol   . Alcoholic cirrhosis of liver with ascites ([place])  Patient has not required paracentesis for the past 6 weeks.  Patient will need to have follow-up with GI established with [facility]  MELD-Na score: 25  Up from 18 one month ago  . Thrombocytopenia ([place]) most likely secondary to cirrhosis    History of hepatitis C - treated by ID continue home mes  History of throat cancer patient will need to establish care with oncology locally  History of alcohol abuse currently in remission   Dehydration we will gently rehydrate and follow   Dementia - pt on Nemenda risk for sundowning, patient in the past has refused to live alone but she is clearly a risk of living by herself.  As stated by  her family.  Will have PT OT assess for safety.  May need behavioral health consult to assess for any contributing psychiatric disorders and determine decision-making ability  . Chest pain-atypical at this point patient is not answering any questions but when family was there she was complaining of chest pain that was ongoing.  Initial troponin unremarkable EKG unremarkable. Given ongoing chest discomfort felt monitor in stepdown.  Obtain echogram patient is a very poor historian  . QT prolongation - - will monitor on tele avoid QT prolonging medications, rehydrate correct electrolytes   Other plan as per orders.  DVT prophylaxis:  SCD      Code Status:   DNR/DNI  as per  family  I had personally discussed CODE STATUS with patient and family  Family Communication:   Family   at  Bedside  plan of care was discussed on the phone with  Sister   Disposition Plan:     To home once workup is complete and patient is stable   Following barriers for discharge:                            AKI corrected  Mental status improved                             Will need to be able to tolerate PO                            Will likely need home health, set up                                               Would benefit from PT/OT eval prior to DC  Ordered                                     Nutrition    consulted                                     Consults called: Behavioral consult  Admission status:  ED Disposition    ED Disposition Condition Comment   Admit  The patient appears reasonably stabilized for admission considering the current resources, flow, and capabilities available in the ED at this time, and I doubt any other [facility] requiring further screening and/or treatment in the ED prior to admission is  present.     Obs      Level of care     tele  For   24H      Precautions: admitted as asymptomatic screening protocol   PPE: Used by the provider:   P100    eye Goggles,  Gloves    Gavino Fouch 06/04/2019, 9:30 PM    [facility]     after 2 AM please page floor coverage PA If 7AM-7PM, please contact the day team taking care of the patient using [email or web address]   Patient was evaluated in the context of the global COVID-19 pandemic, which necessitated consideration that the patient might be at risk for infection with the SARS-CoV-2 virus that causes COVID-19. Institutional protocols and algorithms that pertain to the evaluation of patients at risk for COVID-19 are in a state of rapid change based on information released by regulatory bodies including the CDC and federal and state organizations. These policies and algorithms were followed during the patient's care.

## 2019-06-05 ENCOUNTER — Other Ambulatory Visit: Payer: Self-pay

## 2019-06-05 ENCOUNTER — Observation Stay (HOSPITAL_COMMUNITY): Payer: Medicaid Other

## 2019-06-05 DIAGNOSIS — K7031 Alcoholic cirrhosis of liver with ascites: Secondary | ICD-10-CM | POA: Diagnosis present

## 2019-06-05 DIAGNOSIS — F17210 Nicotine dependence, cigarettes, uncomplicated: Secondary | ICD-10-CM | POA: Diagnosis present

## 2019-06-05 DIAGNOSIS — E860 Dehydration: Secondary | ICD-10-CM | POA: Diagnosis present

## 2019-06-05 DIAGNOSIS — R5381 Other malaise: Secondary | ICD-10-CM | POA: Diagnosis not present

## 2019-06-05 DIAGNOSIS — M2550 Pain in unspecified joint: Secondary | ICD-10-CM | POA: Diagnosis not present

## 2019-06-05 DIAGNOSIS — F05 Delirium due to known physiological condition: Secondary | ICD-10-CM | POA: Diagnosis present

## 2019-06-05 DIAGNOSIS — Z79899 Other long term (current) drug therapy: Secondary | ICD-10-CM | POA: Diagnosis not present

## 2019-06-05 DIAGNOSIS — Z930 Tracheostomy status: Secondary | ICD-10-CM | POA: Diagnosis not present

## 2019-06-05 DIAGNOSIS — J449 Chronic obstructive pulmonary disease, unspecified: Secondary | ICD-10-CM | POA: Diagnosis present

## 2019-06-05 DIAGNOSIS — R410 Disorientation, unspecified: Secondary | ICD-10-CM | POA: Diagnosis not present

## 2019-06-05 DIAGNOSIS — Z66 Do not resuscitate: Secondary | ICD-10-CM | POA: Diagnosis present

## 2019-06-05 DIAGNOSIS — R443 Hallucinations, unspecified: Secondary | ICD-10-CM | POA: Diagnosis present

## 2019-06-05 DIAGNOSIS — G9340 Encephalopathy, unspecified: Secondary | ICD-10-CM | POA: Diagnosis present

## 2019-06-05 DIAGNOSIS — Z7401 Bed confinement status: Secondary | ICD-10-CM | POA: Diagnosis not present

## 2019-06-05 DIAGNOSIS — R079 Chest pain, unspecified: Secondary | ICD-10-CM | POA: Diagnosis not present

## 2019-06-05 DIAGNOSIS — Z9119 Patient's noncompliance with other medical treatment and regimen: Secondary | ICD-10-CM | POA: Diagnosis not present

## 2019-06-05 DIAGNOSIS — F0390 Unspecified dementia without behavioral disturbance: Secondary | ICD-10-CM | POA: Diagnosis present

## 2019-06-05 DIAGNOSIS — D638 Anemia in other chronic diseases classified elsewhere: Secondary | ICD-10-CM | POA: Diagnosis present

## 2019-06-05 DIAGNOSIS — N179 Acute kidney failure, unspecified: Secondary | ICD-10-CM | POA: Diagnosis not present

## 2019-06-05 DIAGNOSIS — D696 Thrombocytopenia, unspecified: Secondary | ICD-10-CM | POA: Diagnosis present

## 2019-06-05 DIAGNOSIS — F1911 Other psychoactive substance abuse, in remission: Secondary | ICD-10-CM | POA: Diagnosis present

## 2019-06-05 DIAGNOSIS — I10 Essential (primary) hypertension: Secondary | ICD-10-CM | POA: Diagnosis not present

## 2019-06-05 DIAGNOSIS — R278 Other lack of coordination: Secondary | ICD-10-CM | POA: Diagnosis present

## 2019-06-05 DIAGNOSIS — B182 Chronic viral hepatitis C: Secondary | ICD-10-CM | POA: Diagnosis present

## 2019-06-05 DIAGNOSIS — F1021 Alcohol dependence, in remission: Secondary | ICD-10-CM | POA: Diagnosis present

## 2019-06-05 DIAGNOSIS — Z85819 Personal history of malignant neoplasm of unspecified site of lip, oral cavity, and pharynx: Secondary | ICD-10-CM | POA: Diagnosis not present

## 2019-06-05 DIAGNOSIS — K7200 Acute and subacute hepatic failure without coma: Secondary | ICD-10-CM | POA: Diagnosis present

## 2019-06-05 DIAGNOSIS — I129 Hypertensive chronic kidney disease with stage 1 through stage 4 chronic kidney disease, or unspecified chronic kidney disease: Secondary | ICD-10-CM | POA: Diagnosis present

## 2019-06-05 DIAGNOSIS — Z8249 Family history of ischemic heart disease and other diseases of the circulatory system: Secondary | ICD-10-CM | POA: Diagnosis not present

## 2019-06-05 DIAGNOSIS — Z20822 Contact with and (suspected) exposure to covid-19: Secondary | ICD-10-CM | POA: Diagnosis not present

## 2019-06-05 LAB — COMPREHENSIVE METABOLIC PANEL
ALT: 29 U/L (ref 0–44)
AST: 49 U/L — ABNORMAL HIGH (ref 15–41)
Albumin: 3.3 g/dL — ABNORMAL LOW (ref 3.5–5.0)
Alkaline Phosphatase: 94 U/L (ref 38–126)
Anion gap: 10 (ref 5–15)
BUN: 28 mg/dL — ABNORMAL HIGH (ref 6–20)
CO2: 20 mmol/L — ABNORMAL LOW (ref 22–32)
Calcium: 8.6 mg/dL — ABNORMAL LOW (ref 8.9–10.3)
Chloride: 101 mmol/L (ref 98–111)
Creatinine, Ser: 1.53 mg/dL — ABNORMAL HIGH (ref 0.44–1.00)
GFR calc Af Amer: 44 mL/min — ABNORMAL LOW (ref >60)
GFR calc non Af Amer: 38 mL/min — ABNORMAL LOW (ref >60)
Glucose, Bld: 125 mg/dL — ABNORMAL HIGH (ref 70–99)
Potassium: 4.1 mmol/L (ref 3.5–5.1)
Sodium: 131 mmol/L — ABNORMAL LOW (ref 135–145)
Total Bilirubin: 2.3 mg/dL — ABNORMAL HIGH (ref 0.3–1.2)
Total Protein: 7.2 g/dL (ref 6.5–8.1)

## 2019-06-05 LAB — CBC WITH DIFFERENTIAL/PLATELET
Abs Immature Granulocytes: 0.02 10*3/uL (ref 0.00–0.07)
Basophils Absolute: 0 10*3/uL (ref 0.0–0.1)
Basophils Relative: 0 %
Eosinophils Absolute: 0 10*3/uL (ref 0.0–0.5)
Eosinophils Relative: 0 %
HCT: 28.9 % — ABNORMAL LOW (ref 36.0–46.0)
Hemoglobin: 9.6 g/dL — ABNORMAL LOW (ref 12.0–15.0)
Immature Granulocytes: 1 %
Lymphocytes Relative: 14 %
Lymphs Abs: 0.6 10*3/uL — ABNORMAL LOW (ref 0.7–4.0)
MCH: 32.8 pg (ref 26.0–34.0)
MCHC: 33.2 g/dL (ref 30.0–36.0)
MCV: 98.6 fL (ref 80.0–100.0)
Monocytes Absolute: 0.5 10*3/uL (ref 0.1–1.0)
Monocytes Relative: 11 %
Neutro Abs: 3.3 10*3/uL (ref 1.7–7.7)
Neutrophils Relative %: 74 %
Platelets: 84 10*3/uL — ABNORMAL LOW (ref 150–400)
RBC: 2.93 MIL/uL — ABNORMAL LOW (ref 3.87–5.11)
RDW: 17.2 % — ABNORMAL HIGH (ref 11.5–15.5)
WBC: 4.4 10*3/uL (ref 4.0–10.5)
nRBC: 0 % (ref 0.0–0.2)

## 2019-06-05 LAB — ECHOCARDIOGRAM COMPLETE

## 2019-06-05 LAB — PHOSPHORUS: Phosphorus: 3.8 mg/dL (ref 2.5–4.6)

## 2019-06-05 LAB — SODIUM, URINE, RANDOM: Sodium, Ur: 72 mmol/L

## 2019-06-05 LAB — CREATININE, URINE, RANDOM: Creatinine, Urine: 86.82 mg/dL

## 2019-06-05 LAB — AMMONIA: Ammonia: 35 umol/L (ref 9–35)

## 2019-06-05 LAB — OSMOLALITY, URINE: Osmolality, Ur: 362 mOsm/kg (ref 300–900)

## 2019-06-05 LAB — TSH: TSH: 0.992 u[IU]/mL (ref 0.350–4.500)

## 2019-06-05 LAB — MAGNESIUM: Magnesium: 1.9 mg/dL (ref 1.7–2.4)

## 2019-06-05 MED ORDER — ACETAMINOPHEN 325 MG PO TABS
650.0000 mg | ORAL_TABLET | Freq: Four times a day (QID) | ORAL | Status: DC | PRN
  Administered 2019-06-05 – 2019-06-06 (×2): 650 mg via ORAL
  Filled 2019-06-05 (×2): qty 2

## 2019-06-05 MED ORDER — ADULT MULTIVITAMIN W/MINERALS CH
1.0000 | ORAL_TABLET | Freq: Every day | ORAL | Status: DC
  Administered 2019-06-05 – 2019-06-07 (×3): 1 via ORAL
  Filled 2019-06-05 (×3): qty 1

## 2019-06-05 MED ORDER — AMLODIPINE BESYLATE 5 MG PO TABS
5.0000 mg | ORAL_TABLET | Freq: Every day | ORAL | Status: DC
  Administered 2019-06-05 – 2019-06-07 (×3): 5 mg via ORAL
  Filled 2019-06-05 (×3): qty 1

## 2019-06-05 MED ORDER — PANTOPRAZOLE SODIUM 40 MG PO TBEC
40.0000 mg | DELAYED_RELEASE_TABLET | Freq: Every day | ORAL | Status: DC
  Administered 2019-06-05 – 2019-06-07 (×3): 40 mg via ORAL
  Filled 2019-06-05 (×3): qty 1

## 2019-06-05 MED ORDER — GABAPENTIN 250 MG/5ML PO SOLN: 300.0000 mg | Freq: Every day | ORAL | Status: DC

## 2019-06-05 MED ORDER — ZOLPIDEM TARTRATE 5 MG PO TABS
2.5000 mg | ORAL_TABLET | Freq: Once | ORAL | Status: AC
  Administered 2019-06-05: 2.5 mg via ORAL
  Filled 2019-06-05: qty 1

## 2019-06-05 MED ORDER — FOLIC ACID 5 MG/ML IJ SOLN
1.0000 mg | Freq: Every day | INTRAMUSCULAR | Status: DC
  Administered 2019-06-05: 1 mg via INTRAVENOUS
  Filled 2019-06-05 (×2): qty 0.2

## 2019-06-05 MED ORDER — HYDROXYZINE HCL 10 MG PO TABS
10.0000 mg | ORAL_TABLET | Freq: Three times a day (TID) | ORAL | Status: DC | PRN
  Administered 2019-06-05 – 2019-06-06 (×4): 10 mg via ORAL
  Filled 2019-06-05 (×5): qty 1

## 2019-06-05 MED ORDER — PAROXETINE HCL 20 MG PO TABS
40.0000 mg | ORAL_TABLET | Freq: Every day | ORAL | Status: DC
  Administered 2019-06-06 – 2019-06-07 (×2): 40 mg via ORAL
  Filled 2019-06-05 (×2): qty 2

## 2019-06-05 MED ORDER — LACTULOSE 10 GM/15ML PO SOLN
20.0000 g | Freq: Three times a day (TID) | ORAL | Status: DC
  Administered 2019-06-05 – 2019-06-07 (×7): 20 g via ORAL
  Filled 2019-06-05 (×7): qty 30

## 2019-06-05 MED ORDER — SOFOSBUVIR-VELPATASVIR 400-100 MG PO TABS
1.0000 | ORAL_TABLET | Freq: Every day | ORAL | Status: DC
  Administered 2019-06-07: 1 via ORAL

## 2019-06-05 MED ORDER — SODIUM CHLORIDE 0.9% FLUSH
3.0000 mL | Freq: Two times a day (BID) | INTRAVENOUS | Status: DC
  Administered 2019-06-05 – 2019-06-07 (×4): 3 mL via INTRAVENOUS

## 2019-06-05 MED ORDER — ENSURE ENLIVE PO LIQD
237.0000 mL | Freq: Two times a day (BID) | ORAL | Status: DC
  Administered 2019-06-05 – 2019-06-07 (×6): 237 mL via ORAL
  Filled 2019-06-05 (×2): qty 237

## 2019-06-05 MED ORDER — THIAMINE HCL 100 MG PO TABS
100.0000 mg | ORAL_TABLET | Freq: Every day | ORAL | Status: DC
  Administered 2019-06-05 – 2019-06-07 (×3): 100 mg via ORAL
  Filled 2019-06-05 (×3): qty 1

## 2019-06-05 MED ORDER — NICOTINE 21 MG/24HR TD PT24
21.0000 mg | MEDICATED_PATCH | Freq: Every day | TRANSDERMAL | Status: DC
  Administered 2019-06-05 – 2019-06-07 (×3): 21 mg via TRANSDERMAL
  Filled 2019-06-05 (×3): qty 1

## 2019-06-05 MED ORDER — SODIUM CHLORIDE 0.9 % IV SOLN: INTRAVENOUS | Status: DC

## 2019-06-05 MED ORDER — RIFAXIMIN 550 MG PO TABS
550.0000 mg | ORAL_TABLET | Freq: Two times a day (BID) | ORAL | Status: DC
  Administered 2019-06-05 – 2019-06-07 (×5): 550 mg via ORAL
  Filled 2019-06-05 (×5): qty 1

## 2019-06-05 MED ORDER — LORATADINE 10 MG PO TABS
10.0000 mg | ORAL_TABLET | Freq: Every day | ORAL | Status: DC
  Administered 2019-06-05 – 2019-06-07 (×3): 10 mg via ORAL
  Filled 2019-06-05 (×3): qty 1

## 2019-06-05 MED ORDER — PAROXETINE HCL 20 MG PO TABS
40.0000 mg | ORAL_TABLET | ORAL | Status: DC
  Administered 2019-06-05: 40 mg via ORAL
  Filled 2019-06-05: qty 2

## 2019-06-05 MED ORDER — POLYVINYL ALCOHOL 1.4 % OP SOLN
1.0000 [drp] | Freq: Four times a day (QID) | OPHTHALMIC | Status: DC | PRN
  Filled 2019-06-05: qty 15

## 2019-06-05 MED ORDER — ALBUTEROL SULFATE HFA 108 (90 BASE) MCG/ACT IN AERS: 2.0000 | INHALATION_SPRAY | Freq: Four times a day (QID) | RESPIRATORY_TRACT | Status: DC | PRN

## 2019-06-05 MED ORDER — ALBUTEROL SULFATE (2.5 MG/3ML) 0.083% IN NEBU: 2.5000 mg | INHALATION_SOLUTION | Freq: Four times a day (QID) | RESPIRATORY_TRACT | Status: DC | PRN

## 2019-06-05 MED ORDER — ACETAMINOPHEN 650 MG RE SUPP: 650.0000 mg | Freq: Four times a day (QID) | RECTAL | Status: DC | PRN

## 2019-06-05 NOTE — ED Notes (Signed)
Pt had medium sized bowel movement. Pt cleaned and complete Bed and gown change. PT placed on purwick. Pt denies any needs at this time.

## 2019-06-05 NOTE — Progress Notes (Signed)
PT Cancellation Note  Patient Details Name: Judy Meza MRN: [id number] DOB: [birth year]   Cancelled Treatment:    Reason Eval/Treat Not Completed: Patient at procedure or test/unavailable, have checked on patient x 2 but with someone. Will check back shortly .   Claretha Cooper 06/05/2019, 11:31 AM  [place] Pager [phone] Office [phone]

## 2019-06-05 NOTE — Evaluation (Signed)
Physical Therapy Evaluation Patient Details Name: Judy Meza MRN: [id number] DOB: May 07, 1960 Today's Date: 06/05/2019   History of Present Illness  59 y.o. female with medical history significant of COPD, throat cancer status post tracheostomy, cirrhosis of liver, chronic kidney disease stage III, hypertension, alcohol abuse in remission, tobacco abuse, history of IVDU in remission, hepatitis C, depression, dementia ,recently 2  hospitalizations in past month for hepatic encephalopathy. Presents 5/23 with AMS/ hepatic encephalopathy  Clinical Impression  The patient is sleepy, able to  Move to sitting without assistance. SPO2 95%. Did not attempt standing this visit. Patient reports that she lives alone, chart eludes that patient may not be  Safe alone. Patient  Recently in hospital. Pt admitted with above diagnosis.  Pt currently with functional limitations due to the deficits listed below (see PT Problem List). Pt will benefit from skilled PT to increase their independence and safety with mobility to allow discharge to the venue listed below.       Follow Up Recommendations Home health PT(TBA)    Equipment Recommendations  (TBA)    Recommendations for Other Services       Precautions / Restrictions Precautions Precautions: Fall Precaution Comments: has a stoma/tracheeostomy      Mobility  Bed Mobility Overal bed mobility: Needs Assistance Bed Mobility: Supine to Sit;Sit to Supine     Supine to sit: Min guard Sit to supine: Min guard   General bed mobility comments: patiient rquired no assist, able to scoot up in bed.  Transfers                 General transfer comment: NT, on high stretcher, RT in and said patient to transfer to SDU  Ambulation/Gait             General Gait Details: NT  Stairs            Wheelchair Mobility    Modified Rankin (Stroke Patients Only)       Balance Overall balance assessment: Needs assistance Sitting-balance  support: No upper extremity supported;Feet unsupported Sitting balance-Leahy Scale: Fair         Standing balance comment: NT                             Pertinent Vitals/Pain Pain Assessment: No/denies pain    Home Living Family/patient expects to be discharged to:: Private residence Living Arrangements: Alone Available Help at Discharge: Family Type of Home: Apartment Home Access: Stairs to enter Entrance Stairs-Rails: Right Entrance Stairs-Number of Steps: flight Home Layout: One level Home Equipment: Environmental consultant - 2 wheels;Cane - single point Additional Comments: info from previous encounter, ? reliability of info. States her sisters and mother help her    Prior Function Level of Independence: Independent         Comments: pt states her family is Psychologist, forensic        Extremity/Trunk Assessment   Upper Extremity Assessment Upper Extremity Assessment: Generalized weakness    Lower Extremity Assessment Lower Extremity Assessment: Generalized weakness    Cervical / Trunk Assessment Cervical / Trunk Assessment: Normal  Communication   Communication: Tracheostomy  Cognition Arousal/Alertness: Awake/alert Behavior During Therapy: WFL for tasks assessed/performed Overall Cognitive Status: No family/caregiver present to determine baseline cognitive functioning Area of Impairment: Orientation                 Orientation Level: Situation  General Comments: oriented to date and  hospital      General Comments      Exercises     Assessment/Plan    PT Assessment Patient needs continued PT services  PT Problem List Decreased strength;Decreased activity tolerance;Decreased mobility;Decreased cognition;Decreased knowledge of use of DME;Decreased safety awareness;Decreased knowledge of precautions       PT Treatment Interventions DME instruction;Therapeutic activities;Cognitive remediation;Gait  training;Therapeutic exercise;Patient/family education;Functional mobility training    PT Goals (Current goals can be found in the Care Plan section)  Acute Rehab PT Goals Patient Stated Goal: to go home PT Goal Formulation: With patient Time For Goal Achievement: 06/19/19 Potential to Achieve Goals: Fair    Frequency Min 3X/week   Barriers to discharge Decreased caregiver support      Co-evaluation               AM-PAC PT "6 Clicks" Mobility  Outcome Measure Help needed turning from your back to your side while in a flat bed without using bedrails?: None Help needed moving from lying on your back to sitting on the side of a flat bed without using bedrails?: None Help needed moving to and from a bed to a chair (including a wheelchair)?: A Little Help needed standing up from a chair using your arms (e.g., wheelchair or bedside chair)?: A Little Help needed to walk in hospital room?: A Lot Help needed climbing 3-5 steps with a railing? : Total 6 Click Score: 17    End of Session   Activity Tolerance: Patient limited by fatigue Patient left: in bed;with call bell/phone within reach;with bed alarm set Nurse Communication: Mobility status PT Visit Diagnosis: Unsteadiness on feet (R26.81)    Time: 5003-7048 PT Time Calculation (min) (ACUTE ONLY): 21 min   Charges:   PT Evaluation $PT Eval Low Complexity: [place] Pager [phone] Office [phone]   Claretha Cooper 06/05/2019, 1:31 PM

## 2019-06-05 NOTE — Progress Notes (Signed)
  Echocardiogram 2D Echocardiogram has been performed.  Michiel Cowboy 06/05/2019, 8:48 AM

## 2019-06-05 NOTE — Progress Notes (Signed)
[facility]                                                                              Patient Demographics  Judy Meza, is a 59 y.o. female, DOB - 29-Apr-1960, WHQ:[id number]  Admit date - 06/04/2019   Admitting Physician Toy Baker, MD  Outpatient Primary MD for the patient is Patient, No Pcp Per  Outpatient specialists:   LOS - 0  days   Medical records reviewed and are as summarized below:    Chief Complaint  Patient presents with  . Altered Mental Status       Brief summary   Patient is a 59 year old female with history of cirrhosis, history of alcohol and drug use, COPD, hypertension, dementia, CKD stage III, tobacco use ongoing status post tracheostomy due to throat cancer in May 2020, hepatitis C presented with acute encephalopathy on the day of admission.  Patient originally from [place], moved to [place] with her family for the past 1 year.  Patient's family used to visit every day to help administer medications, last time seen a day before the admission, she was doing well.  She took some night medications on the morning of admission.  Patient sister checked on her but she was too confused and having trouble opening the door.  Patient sister also noticed she had asterixis which she knows in the past worse explained by elevated ammonia.  Family also reported patient having hallucinations.  Sister gave her lactulose and brought her to the ED. Patient had 2 BMs since then and which improved her mentation.  Patient's family noticed that she had run out of this rifaximin few days ago and she had burned her ground when cooking since she has been living by herself for the past 3 days.  Family is also concerned that she is unsafe at home by herself and at risk of being lost. COVID-19 PCR negative Ammonia level 64 at the time of admission, creatinine 2.47, BUN 38, creatinine was 1.2 on 05/11/2019  Assessment & Plan     Principal problem  Acute  hepatic encephalopathy -Likely due to not taking medications, patient restarted on lactulose and rifaximin -Somewhat more alert and awake -Ammonia level 64 at the time of admission, now improved to 35 -Hold sedating meds, Neurontin   Active problems Alcoholic liver cirrhosis with ascites, thrombocytopenia -Patient has not required paracentesis for the past 6 weeks, will need to have follow-up with GI, established with [facility] -Currently no abdominal pain or shortness of breath  Acute kidney injury on CKD (chronic kidney disease), stage III, dehydration -At baseline, creatinine 1.2, presented with creatinine of 2.47 -Continue IV fluid hydration, creatinine improving to 1.5 today    COPD with chronic bronchitis (HCC) -Currently stable, continue albuterol as needed   Tobacco abuse -Continue nicotine patch  History of hepatitis C -Continue sofosbuvir-velpatasvir   History of alcohol use -Family reported that she is not drinking lately, however patient has been living alone for the last 3 days  -closely monitor for any EtOH withdrawal   History of throat cancer -s/p tracheostomy, will need to establish care with oncology locally in [place] -  will send a referral to oncology at the time of discharge.  Dementia, generalized debility [place], with risk of sundowning, clearly a risk of living by herself. Will assess functional status with PT evaluation  Code Status: DNR DVT Prophylaxis:  SCD's Family Communication: Discussed all imaging results, lab results, explained to the patient.  Called patient's sister, Judy Meza on the phone but unable to reach her  Disposition Plan:     Status is: Observation, needs PT OT evaluation, unsafe discharge to home  The patient remains OBS appropriate and will d/c before 2 midnights.  Dispo: The patient is from: Home              Anticipated d/c is to: Undetermined              Anticipated d/c date is: 2 days              Patient  currently is not medically stable to d/c.      Time Spent in minutes 35 minutes  Procedures:  None  Consultants:   None  Antimicrobials:   Anti-infectives (From admission, onward)   Start     Dose/Rate Route Frequency Ordered Stop   06/05/19 1000  rifaximin (XIFAXAN) tablet 550 mg     550 mg Oral 2 times daily 06/05/19 0204     06/05/19 1000  Sofosbuvir-Velpatasvir 400-100 MG TABS 1 tablet     1 tablet Oral Daily 06/05/19 0204            Medications  Scheduled Meds: . amLODipine  5 mg Oral Daily  . feeding supplement (ENSURE ENLIVE)  237 mL Oral BID BM  . folic acid  1 mg Intravenous Daily  . gabapentin  300 mg Oral QHS  . lactulose  20 g Oral TID  . loratadine  10 mg Oral Daily  . multivitamin with minerals  1 tablet Oral Daily  . nicotine  21 mg Transdermal Daily  . pantoprazole  40 mg Oral Daily  . PARoxetine  40 mg Oral BH-[id number]  . rifaximin  550 mg Oral BID  . sodium chloride flush  3 mL Intravenous Q12H  . Sofosbuvir-Velpatasvir  1 tablet Oral Daily  . thiamine  100 mg Oral Daily   Continuous Infusions: . sodium chloride 75 mL/hr at 06/05/19 0540   PRN Meds:.acetaminophen **OR** acetaminophen, albuterol, albuterol, hydrOXYzine, polyvinyl alcohol      Subjective:   Judy Meza was seen and examined today.  At the time of my examination, sleepy but easily arousable and responds to questions with shaking of her head.  No fevers or chills, no active nausea vomiting or abdominal pain.  Patient denies dizziness, chest pain, shortness of breath, abdominal pain, N/V/D/C, new weakness, numbess, tingling.  Objective:   Vitals:   06/05/19 0930 06/05/19 1000 06/05/19 1030 06/05/19 1110  BP: (!) 142/92 137/85 (!) 147/101 (!) 145/96  Pulse: 89 87 88 88  Resp: 16 (!) 21 14 19   Temp:      TempSrc:      SpO2: 100% 100% 100% 100%   No intake or output data in the 24 hours ending 06/05/19 1135   Wt Readings from Last 3 Encounters:  06/01/19 54.3 kg    05/22/19 51.7 kg  05/09/19 47 kg     Exam  General: Sleepy but easily arousable, NAD,  Cardiovascular: S1 S2 auscultated, no murmurs, RRR  Respiratory: Clear to auscultation bilaterally, no wheezing, rales or rhonchi  Gastrointestinal: Soft, nontender, nondistended, +  bowel sounds  Ext: no pedal edema bilaterally  Neuro: Moving all 4 extremities  Musculoskeletal: No digital cyanosis, clubbing  Skin: No rashes  Psych: Sleepy but arousable,   Data Reviewed:  I have personally reviewed following labs and imaging studies  Micro Results Recent Results (from the past 240 hour(s))  SARS Coronavirus 2 by RT PCR (hospital order, performed in [facility] hospital lab) Nasopharyngeal Nasopharyngeal Swab     Status: None   Collection Time: 06/04/19  4:28 PM   Specimen: Nasopharyngeal Swab  Result Value Ref Range Status   SARS Coronavirus 2 NEGATIVE NEGATIVE Final    Comment: (NOTE) SARS-CoV-2 target nucleic acids are NOT DETECTED. The SARS-CoV-2 RNA is generally detectable in upper and lower respiratory specimens during the acute phase of infection. The lowest concentration of SARS-CoV-2 viral copies this assay can detect is 250 copies / mL. A negative result does not preclude SARS-CoV-2 infection and should not be used as the sole basis for treatment or other patient management decisions.  A negative result may occur with improper specimen collection / handling, submission of specimen other than nasopharyngeal swab, presence of viral mutation(s) within the areas targeted by this assay, and inadequate number of viral copies (<250 copies / mL). A negative result must be combined with clinical observations, patient history, and epidemiological information. Fact Sheet for Patients:   StrictlyIdeas.no Fact Sheet for Healthcare Providers: BankingDealers.co.za This test is not yet approved or cleared  by the [place] FDA and has  been authorized for detection and/or diagnosis of SARS-CoV-2 by FDA under an Emergency Use Authorization (EUA).  This EUA will remain in effect (meaning this test can be used) for the duration of the COVID-19 declaration under Section 564(b)(1) of the Act, 21 U.S.C. section 360bbb-3(b)(1), unless the authorization is terminated or revoked sooner. Performed at [facility], [place] [address], [place], [place] [address]     Radiology Reports CT Head Wo Contrast  Result Date: 06/04/2019 CLINICAL DATA:  Confusion, hyperammonemia, cirrhosis EXAM: CT HEAD WITHOUT CONTRAST TECHNIQUE: Contiguous axial images were obtained from the base of the skull through the vertex without intravenous contrast. COMPARISON:  04/29/2019 FINDINGS: Brain: Chronic small vessel ischemic changes are seen in the periventricular white matter most pronounced in the left frontal lobe, stable. No signs of acute infarct or hemorrhage. Lateral ventricles and midline structures are unremarkable. No acute extra-axial fluid collections. No mass effect. Vascular: No hyperdense vessel or unexpected calcification. Skull: Normal. Negative for fracture or focal lesion. Sinuses/Orbits: No acute finding. Other: None. IMPRESSION: 1. Stable chronic small vessel ischemic changes, no acute process. Electronically Signed   By: Randa Ngo M.D.   On: 06/04/2019 19:44   DG Chest Port 1 View  Result Date: 06/04/2019 CLINICAL DATA:  Chest pain. EXAM: PORTABLE CHEST 1 VIEW COMPARISON:  April 29, 2019 FINDINGS: Multiple overlying radiopaque cardiac lead wires are seen. There is no evidence of acute infiltrate, pleural effusion or pneumothorax. The heart size and mediastinal contours are within normal limits. The visualized skeletal structures are unremarkable. IMPRESSION: No active disease. Electronically Signed   By: Virgina Norfolk M.D.   On: 06/04/2019 18:13   DG Chest Port 1 View  Result Date: 05/09/2019 CLINICAL DATA:   Altered mental status. EXAM: PORTABLE CHEST 1 VIEW COMPARISON:  April 29, 2019. FINDINGS: The heart size and mediastinal contours are within normal limits. Both lungs are clear. The visualized skeletal structures are unremarkable. IMPRESSION: No active disease. Electronically Signed   By: Jeneen Rinks  Murlean Caller M.D.   On: 05/09/2019 10:46   ECHOCARDIOGRAM COMPLETE  Result Date: 06/05/2019    ECHOCARDIOGRAM REPORT   Patient Name:   Judy Meza Date of Exam: 06/05/2019 Medical Rec #:  [id number]     Height:       62.0 in Accession #:    [id number]    Weight:       119.8 lb Date of Birth:  [birth year]     BSA:          1.538 m Patient Age:    5 years      BP:           140/91 mmHg Patient Gender: F             HR:           86 bpm. Exam Location:  Inpatient Procedure: 2D Echo, Cardiac Doppler and Color Doppler Indications:    Chest Pain 786.50 / R07.9  History:        Patient has no prior history of Echocardiogram examinations.                 COPD, Signs/Symptoms:Chest Pain; Risk Factors:Current Smoker.  Sonographer:    Vickie Epley RDCS Referring Phys: [id number] [place]  1. Left ventricular ejection fraction, by estimation, is 65 to 70%. The left ventricle has normal function. The left ventricle has no regional wall motion abnormalities. Left ventricular diastolic parameters are consistent with Grade II diastolic dysfunction (pseudonormalization).  2. Right ventricular systolic function is normal. The right ventricular size is normal. There is normal pulmonary artery systolic pressure.  3. The mitral valve is grossly normal. Trivial mitral valve regurgitation. No evidence of mitral stenosis.  4. The aortic valve is tricuspid. Aortic valve regurgitation is not visualized. No aortic stenosis is present. FINDINGS  Left Ventricle: Left ventricular ejection fraction, by estimation, is 65 to 70%. The left ventricle has normal function. The left ventricle has no regional wall motion abnormalities. The left  ventricular internal cavity size was normal in size. There is  no left ventricular hypertrophy. Left ventricular diastolic parameters are consistent with Grade II diastolic dysfunction (pseudonormalization). Right Ventricle: The right ventricular size is normal. No increase in right ventricular wall thickness. Right ventricular systolic function is normal. There is normal pulmonary artery systolic pressure. The tricuspid regurgitant velocity is 2.54 m/s, and  with an assumed right atrial pressure of 3 mmHg, the estimated right ventricular systolic pressure is 64.3 mmHg. Left Atrium: Left atrial size was normal in size. Right Atrium: Right atrial size was normal in size. Pericardium: There is no evidence of pericardial effusion. Mitral Valve: The mitral valve is grossly normal. Trivial mitral valve regurgitation. No evidence of mitral valve stenosis. Tricuspid Valve: The tricuspid valve is normal in structure. Tricuspid valve regurgitation is trivial. No evidence of tricuspid stenosis. Aortic Valve: The aortic valve is tricuspid. Aortic valve regurgitation is not visualized. No aortic stenosis is present. Pulmonic Valve: The pulmonic valve was grossly normal. Pulmonic valve regurgitation is not visualized. No evidence of pulmonic stenosis. Aorta: The aortic root and ascending aorta are structurally normal, with no evidence of dilitation. IAS/Shunts: The atrial septum is grossly normal.  LEFT VENTRICLE PLAX 2D LVIDd:         4.80 cm      Diastology LVIDs:         3.20 cm      LV e' lateral:   6.85 cm/s LV PW:  0.80 cm      LV E/e' lateral: 12.6 LV IVS:        0.80 cm      LV e' medial:    5.87 cm/s LVOT diam:     1.60 cm      LV E/e' medial:  14.7 LV SV:         49 LV SV Index:   32 LVOT Area:     2.01 cm  LV Volumes (MOD) LV vol d, MOD A2C: 116.0 ml LV vol d, MOD A4C: 89.3 ml LV vol s, MOD A2C: 36.4 ml LV vol s, MOD A4C: 37.9 ml LV SV MOD A2C:     79.6 ml LV SV MOD A4C:     89.3 ml LV SV MOD BP:      67.3 ml  RIGHT VENTRICLE RV S prime:     12.80 cm/s TAPSE (M-mode): 1.9 cm LEFT ATRIUM             Index       RIGHT ATRIUM           Index LA diam:        3.50 cm 2.28 cm/m  RA Area:     14.90 cm LA Vol (A2C):   35.9 ml 23.35 ml/m RA Volume:   38.20 ml  24.85 ml/m LA Vol (A4C):   39.4 ml 25.63 ml/m LA Biplane Vol: 38.6 ml 25.11 ml/m  AORTIC VALVE LVOT Vmax:   110.00 cm/s LVOT Vmean:  76.400 cm/s LVOT VTI:    0.242 m  AORTA Ao Root diam: 3.30 cm MITRAL VALVE               TRICUSPID VALVE MV Area (PHT): 4.21 cm    TR Peak grad:   25.8 mmHg MV Decel Time: 180 msec    TR Vmax:        254.00 cm/s MR Peak grad: 98.4 mmHg MR Vmax:      496.00 cm/s  SHUNTS MV E velocity: 86.50 cm/s  Systemic VTI:  0.24 m MV A velocity: 98.10 cm/s  Systemic Diam: 1.60 cm MV E/A ratio:  0.88 Mertie Moores MD Electronically signed by Mertie Moores MD Signature Date/Time: 06/05/2019/11:06:23 AM    Final     Lab Data:  CBC: Recent Labs  Lab 06/04/19 1702 06/05/19 0540  WBC 5.0 4.4  NEUTROABS  --  3.3  HGB 9.5* 9.6*  HCT 28.6* 28.9*  MCV 98.3 98.6  PLT 84* 84*   Basic Metabolic Panel: Recent Labs  Lab 06/04/19 1702 06/04/19 2017 06/05/19 0609  NA 132*  --  131*  K 4.2  --  4.1  CL 101  --  101  CO2 20*  --  20*  GLUCOSE 113*  --  125*  BUN 38*  --  28*  CREATININE 2.47*  --  1.53*  CALCIUM 9.3  --  8.6*  MG  --  1.9 1.9  PHOS  --  3.6 3.8   GFR: Estimated Creatinine Clearance: 32.5 mL/min (A) (by C-G formula based on SCr of 1.53 mg/dL (H)). Liver Function Tests: Recent Labs  Lab 06/04/19 1702 06/05/19 0609  AST 56* 49*  ALT 30 29  ALKPHOS 103 94  BILITOT 1.9* 2.3*  PROT 8.0 7.2  ALBUMIN 3.6 3.3*   No results for input(s): LIPASE, AMYLASE in the last 168 hours. Recent Labs  Lab 06/04/19 1605 06/05/19 0609  AMMONIA 64* 35   Coagulation Profile: Recent Labs  Lab  06/04/19 1846  INR 1.5*   Cardiac Enzymes: Recent Labs  Lab 06/04/19 2017  CKTOTAL 138   BNP (last 3 results) No results for  input(s): PROBNP in the last 8760 hours. HbA1C: No results for input(s): HGBA1C in the last 72 hours. CBG: No results for input(s): GLUCAP in the last 168 hours. Lipid Profile: No results for input(s): CHOL, HDL, LDLCALC, TRIG, CHOLHDL, LDLDIRECT in the last 72 hours. Thyroid Function Tests: Recent Labs    06/05/19 0609  TSH 0.992   Anemia Panel: No results for input(s): VITAMINB12, FOLATE, FERRITIN, TIBC, IRON, RETICCTPCT in the last 72 hours. Urine analysis:    Component Value Date/Time   COLORURINE YELLOW 06/04/2019 1802   APPEARANCEUR HAZY (A) 06/04/2019 1802   LABSPEC 1.010 06/04/2019 1802   PHURINE 6.0 06/04/2019 1802   GLUCOSEU NEGATIVE 06/04/2019 1802   HGBUR SMALL (A) 06/04/2019 1802   BILIRUBINUR NEGATIVE 06/04/2019 1802   KETONESUR NEGATIVE 06/04/2019 1802   PROTEINUR NEGATIVE 06/04/2019 1802   NITRITE NEGATIVE 06/04/2019 1802   LEUKOCYTESUR SMALL (A) 06/04/2019 1802     Judy Meza M.D. [facility] 06/05/2019, 11:35 AM   Call night coverage person covering after 7pm

## 2019-06-05 NOTE — ED Notes (Signed)
Pt eating breakfast. Able to swallow pills whole with water. Morning meds given.

## 2019-06-05 NOTE — ED Notes (Signed)
EKG order on admission shown to [facility] MD exporting not working at this time will attempt to export later.

## 2019-06-05 NOTE — ED Notes (Signed)
Pt had large loose stool. Pt cleaned bed changed. PT now resting without needs at this time.

## 2019-06-05 NOTE — ED Notes (Signed)
Cardiac ECHO at bedside.

## 2019-06-05 NOTE — ED Notes (Signed)
Sent down redraw for mooring labs per lab request.

## 2019-06-05 NOTE — Progress Notes (Signed)
Pt request pain med, specifically Morphine and Dilaudid; Pt sister, Charisse Klinefelter called to report pt should not receive Opioids pain meds due to past addictive behavior for many years. Sister Charisse Klinefelter reports pt is not save to live alone and may need SNF. MD updated. Pt educated on pain control and management. SRP, RN

## 2019-06-06 DIAGNOSIS — G9340 Encephalopathy, unspecified: Secondary | ICD-10-CM

## 2019-06-06 LAB — CBC
HCT: 25.9 % — ABNORMAL LOW (ref 36.0–46.0)
Hemoglobin: 8.4 g/dL — ABNORMAL LOW (ref 12.0–15.0)
MCH: 32.7 pg (ref 26.0–34.0)
MCHC: 32.4 g/dL (ref 30.0–36.0)
MCV: 100.8 fL — ABNORMAL HIGH (ref 80.0–100.0)
Platelets: 66 10*3/uL — ABNORMAL LOW (ref 150–400)
RBC: 2.57 MIL/uL — ABNORMAL LOW (ref 3.87–5.11)
RDW: 16.7 % — ABNORMAL HIGH (ref 11.5–15.5)
WBC: 4.6 10*3/uL (ref 4.0–10.5)
nRBC: 0 % (ref 0.0–0.2)

## 2019-06-06 LAB — COMPREHENSIVE METABOLIC PANEL
ALT: 27 U/L (ref 0–44)
AST: 47 U/L — ABNORMAL HIGH (ref 15–41)
Albumin: 2.7 g/dL — ABNORMAL LOW (ref 3.5–5.0)
Alkaline Phosphatase: 99 U/L (ref 38–126)
Anion gap: 6 (ref 5–15)
BUN: 22 mg/dL — ABNORMAL HIGH (ref 6–20)
CO2: 18 mmol/L — ABNORMAL LOW (ref 22–32)
Calcium: 8.5 mg/dL — ABNORMAL LOW (ref 8.9–10.3)
Chloride: 106 mmol/L (ref 98–111)
Creatinine, Ser: 1.09 mg/dL — ABNORMAL HIGH (ref 0.44–1.00)
GFR calc Af Amer: >60 mL/min (ref >60)
GFR calc non Af Amer: 57 mL/min — ABNORMAL LOW (ref >60)
Glucose, Bld: 125 mg/dL — ABNORMAL HIGH (ref 70–99)
Potassium: 4.2 mmol/L (ref 3.5–5.1)
Sodium: 130 mmol/L — ABNORMAL LOW (ref 135–145)
Total Bilirubin: 1.2 mg/dL (ref 0.3–1.2)
Total Protein: 6.2 g/dL — ABNORMAL LOW (ref 6.5–8.1)

## 2019-06-06 LAB — AMMONIA: Ammonia: 65 umol/L — ABNORMAL HIGH (ref 9–35)

## 2019-06-06 MED ORDER — FOLIC ACID 1 MG PO TABS
1.0000 mg | ORAL_TABLET | Freq: Every day | ORAL | Status: DC
  Administered 2019-06-06 – 2019-06-07 (×2): 1 mg via ORAL
  Filled 2019-06-06 (×2): qty 1

## 2019-06-06 MED ORDER — ZOLPIDEM TARTRATE 5 MG PO TABS
2.5000 mg | ORAL_TABLET | Freq: Once | ORAL | Status: AC
  Administered 2019-06-06: 2.5 mg via ORAL
  Filled 2019-06-06: qty 1

## 2019-06-06 MED ORDER — POLYVINYL ALCOHOL 1.4 % OP SOLN
1.0000 [drp] | Freq: Three times a day (TID) | OPHTHALMIC | Status: DC
  Administered 2019-06-06 – 2019-06-07 (×5): 1 [drp] via OPHTHALMIC
  Filled 2019-06-06: qty 15

## 2019-06-06 NOTE — Progress Notes (Signed)
Physical Therapy Treatment Patient Details Name: Judy Meza MRN: [id number] DOB: Mar 14, 1960 Today's Date: 06/06/2019    History of Present Illness 59 y.o. female with medical history significant of COPD, throat cancer status post tracheostomy, cirrhosis of liver, chronic kidney disease stage III, hypertension, alcohol abuse in remission, tobacco abuse, history of IVDU in remission, hepatitis C, depression, dementia ,recently 2  hospitalizations in past month for hepatic encephalopathy. Presents 5/23 with AMS/ hepatic encephalopathy    PT Comments    The patient ambulated x 100' with min to minguard assitance for steadying balance without AD. Patient's SPO2 95%. The patient will benefit from close supervision for safety.  Follow Up Recommendations  SNF     Equipment Recommendations  None recommended by PT    Recommendations for Other Services       Precautions / Restrictions Precautions Precautions: Fall Precaution Comments: has a stoma/tracheeostomy Restrictions Other Position/Activity Restrictions: Pt states she falls about every 2-3 months.    Mobility  Bed Mobility   Bed Mobility: Supine to Sit;Sit to Supine     Supine to sit: Supervision Sit to supine: Supervision   General bed mobility comments: patiient rquired no assist, able to scoot up in bed.  Transfers Overall transfer level: Needs assistance Equipment used: None Transfers: Sit to/from Stand Sit to Stand: Supervision         General transfer comment: PT was safe coming sit to stand.  patient did not want assistance. up in BR without assistance  Ambulation/Gait Ambulation/Gait assistance: Min guard;Min assist Gait Distance (Feet): 100 Feet Assistive device: None Gait Pattern/deviations: Step-through pattern;Drifts right/left Gait velocity: decr   General Gait Details: occassional light assist at shoulder   Stairs             Wheelchair Mobility    Modified Rankin (Stroke Patients  Only)       Balance Overall balance assessment: History of Falls;Needs assistance Sitting-balance support: No upper extremity supported;Feet unsupported Sitting balance-Leahy Scale: Good     Standing balance support: During functional activity;No upper extremity supported Standing balance-Leahy Scale: Fair Standing balance comment: tends to reach for objects                            Cognition Arousal/Alertness: Awake/alert Behavior During Therapy: WFL for tasks assessed/performed Overall Cognitive Status: Impaired/Different from baseline Area of Impairment: Orientation                 Orientation Level: Place;Situation             General Comments: Pt knows date. States she is at [facility].  Forgot name of the city (although has only lived here 3 weeks).      Exercises      General Comments        Pertinent Vitals/Pain Pain Assessment: No/denies pain    Home Living                      Prior Function            PT Goals (current goals can now be found in the care plan section) Progress towards PT goals: Progressing toward goals    Frequency    Min 2X/week      PT Plan Discharge plan needs to be updated;Frequency needs to be updated    Co-evaluation              AM-PAC PT "6 Clicks" Mobility  Outcome Measure  Help needed turning from your back to your side while in a flat bed without using bedrails?: None Help needed moving from lying on your back to sitting on the side of a flat bed without using bedrails?: None Help needed moving to and from a bed to a chair (including a wheelchair)?: A Little Help needed standing up from a chair using your arms (e.g., wheelchair or bedside chair)?: A Little Help needed to walk in hospital room?: A Little Help needed climbing 3-5 steps with a railing? : A Little 6 Click Score: 20    End of Session   Activity Tolerance: Patient tolerated treatment well Patient left: in  bed;with call bell/phone within reach;with bed alarm set Nurse Communication: Mobility status PT Visit Diagnosis: Unsteadiness on feet (R26.81)     Time: 9509-3267 PT Time Calculation (min) (ACUTE ONLY): 22 min  Charges:  $Gait Training: 8-22 mins                     [place] Pager [phone] Office [phone]    Claretha Cooper 06/06/2019, 6:03 PM

## 2019-06-06 NOTE — NC FL2 (Signed)
[place] LEVEL OF CARE SCREENING TOOL     IDENTIFICATION  Patient Name: Judy Meza Birthdate: [birth year] Sex: female Admission Date (Current Location): 06/04/2019  [facility] and [place] Number:  Herbalist and Address:  [facility],  [place] [address] [place]      Provider Number: [id number]  Attending Physician Name and Address:  Mendel Corning, MD  Relative Name and Phone Number:       Current Level of Care: Hospital Recommended Level of Care: [facility] Prior Approval Number:    Date Approved/Denied:   PASRR Number: [id number]  Discharge Plan: SNF    Current Diagnoses: Patient Active Problem List   Diagnosis Date Noted  . Acute encephalopathy 06/05/2019  . Chest pain 06/04/2019  . QT prolongation 06/04/2019  . Metabolic acidosis 30/86/5784  . Dementia without behavioral disturbance ([place]) 05/10/2019  . Chronic hepatitis C without hepatic coma ([place]) 05/10/2019  . Hepatic encephalopathy ([place]) 05/09/2019  . Pancytopenia ([place])   . Cirrhosis of liver ([place])   . CKD (chronic kidney disease), stage III 04/29/2019  . COPD with chronic bronchitis ([place]) 04/29/2019  . Tobacco abuse 04/29/2019  . Alcoholism in remission ([place]) 04/29/2019  . Acute hepatic encephalopathy 04/29/2019  . AKI (acute kidney injury) ([place]) 04/29/2019  . Alcoholic cirrhosis of liver with ascites ([place]) 04/29/2019  . Thrombocytopenia ([place]) 04/29/2019  . Hyperkalemia 04/29/2019    Orientation RESPIRATION BLADDER Height & Weight     Self, Time, Situation, Place  Normal(has an old stoma from tracheostomy that has been removed) Continent Weight: 54 kg Height:  5\' 2"  (157.5 cm)  BEHAVIORAL SYMPTOMS/MOOD NEUROLOGICAL BOWEL NUTRITION STATUS      Continent Diet  AMBULATORY STATUS COMMUNICATION OF NEEDS Skin   Extensive Assist Verbally Normal                       Personal Care Assistance Level of Assistance  Bathing, Dressing, Total  care Bathing Assistance: Limited assistance   Dressing Assistance: Limited assistance Total Care Assistance: Limited assistance   Functional Limitations Info             SPECIAL CARE FACTORS FREQUENCY  PT (By licensed PT), OT (By licensed OT)     PT Frequency: 5x weekly OT Frequency: 5x weekly            Contractures Contractures Info: Not present    Additional Factors Info  Code Status, Allergies Code Status Info: DNR Allergies Info: NKDA           Current Medications (06/06/2019):  This is the current hospital active medication list Current Facility-Administered Medications  Medication Dose Route Frequency Provider Last Rate Last Admin  . acetaminophen (TYLENOL) tablet 650 mg  650 mg Oral Q6H PRN Toy Baker, MD   650 mg at 06/05/19 1618   Or  . acetaminophen (TYLENOL) suppository 650 mg  650 mg Rectal Q6H PRN Doutova, Anastassia, MD      . albuterol (PROVENTIL) (2.5 MG/3ML) 0.083% nebulizer solution 2.5 mg  2.5 mg Nebulization Q6H PRN Doutova, Anastassia, MD      . albuterol (VENTOLIN HFA) 108 (90 Base) MCG/ACT inhaler 2 puff  2 puff Inhalation Q6H PRN Doutova, Anastassia, MD      . amLODipine (NORVASC) tablet 5 mg  5 mg Oral Daily Doutova, Anastassia, MD   5 mg at 06/06/19 0839  . feeding supplement (ENSURE ENLIVE) (ENSURE ENLIVE) liquid 237 mL  237 mL Oral BID  BM Toy Baker, MD   237 mL at 06/06/19 0839  . folic acid (FOLVITE) tablet 1 mg  1 mg Oral Daily Rai, Ripudeep K, MD   1 mg at 06/06/19 1225  . hydrOXYzine (ATARAX/VISTARIL) tablet 10 mg  10 mg Oral TID PRN Toy Baker, MD   10 mg at 06/06/19 1225  . lactulose (CHRONULAC) 10 GM/15ML solution 20 g  20 g Oral TID Toy Baker, MD   20 g at 06/06/19 0839  . loratadine (CLARITIN) tablet 10 mg  10 mg Oral Daily Doutova, Anastassia, MD   10 mg at 06/06/19 6599  . multivitamin with minerals tablet 1 tablet  1 tablet Oral Daily Toy Baker, MD   1 tablet at 06/06/19 0838  .  nicotine (NICODERM CQ - dosed in mg/24 hours) patch 21 mg  21 mg Transdermal Daily Doutova, Anastassia, MD   21 mg at 06/06/19 0840  . pantoprazole (PROTONIX) EC tablet 40 mg  40 mg Oral Daily Toy Baker, MD   40 mg at 06/06/19 0842  . PARoxetine (PAXIL) tablet 40 mg  40 mg Oral Daily Rai, Ripudeep K, MD   40 mg at 06/06/19 0839  . polyvinyl alcohol (LIQUIFILM TEARS) 1.4 % ophthalmic solution 1 drop  1 drop Both Eyes TID Rai, Ripudeep K, MD   1 drop at 06/06/19 1226  . rifaximin (XIFAXAN) tablet 550 mg  550 mg Oral BID Toy Baker, MD   550 mg at 06/06/19 0839  . sodium chloride flush (NS) 0.9 % injection 3 mL  3 mL Intravenous Q12H Doutova, Anastassia, MD   3 mL at 06/06/19 0842  . Sofosbuvir-Velpatasvir 400-100 MG TABS 1 tablet  1 tablet Oral Daily Doutova, Anastassia, MD      . thiamine tablet 100 mg  100 mg Oral Daily Doutova, Anastassia, MD   100 mg at 06/06/19 3570     Discharge Medications: Please see discharge summary for a list of discharge medications.  Relevant Imaging Results:  Relevant Lab Results:   Additional Information SSN [id number]  Joaquin Courts, RN

## 2019-06-06 NOTE — Evaluation (Signed)
Occupational Therapy Evaluation Patient Details Name: Judy Meza MRN: [id number] DOB: May 04, 1960 Today's Date: 06/06/2019    History of Present Illness 59 y.o. female with medical history significant of COPD, throat cancer status post tracheostomy, cirrhosis of liver, chronic kidney disease stage III, hypertension, alcohol abuse in remission, tobacco abuse, history of IVDU in remission, hepatitis C, depression, dementia ,recently 2  hospitalizations in past month for hepatic encephalopathy. Presents 5/23 with AMS/ hepatic encephalopathy   Clinical Impression   Pt admitted with the above diagnosis and has the deficits outlined below. Pt would benefit from cont OT to attempt to get pt to a modified independence level of care so she can d/c back to her apartment to live alone.  Pt moved here from [place] 3 weeks ago, lived with mother and sister for 3 weeks and they just moved her into her own apartment on Friday.  They feel she should not live alone despite moving her into this apartment. Mother provides food and sister pays all her bills.  There are concerns for this pt living alone.  It appears her status waxes and wanes where some days she can care for herself and other days she cannot.  Given her past lifestyle habits, this pt would benefit from more supervision at home.  Will continue to see pt acutely with hopes of modified independence but feel in an unsupervised setting such has her apartment, she will not be able to maintain modified independent level of care.      Follow Up Recommendations  SNF;Supervision/Assistance - 24 hour    Equipment Recommendations  Tub/shower bench    Recommendations for Other Services       Precautions / Restrictions Precautions Precautions: Fall Precaution Comments: has a stoma/tracheeostomy Restrictions Weight Bearing Restrictions: No Other Position/Activity Restrictions: Pt states she falls about every 2-3 months.      Mobility Bed  Mobility Overal bed mobility: Needs Assistance Bed Mobility: Supine to Sit;Sit to Supine     Supine to sit: Supervision Sit to supine: Supervision   General bed mobility comments: patiient rquired no assist, able to scoot up in bed.  Transfers Overall transfer level: Needs assistance Equipment used: 1 person hand held assist Transfers: Sit to/from [facility] Sit to Stand: Supervision Stand pivot transfers: Supervision       General transfer comment: PT was safe coming sit to stand.  Pt does require slightly more assist when walking without assistive device.      Balance Overall balance assessment: Needs assistance Sitting-balance support: No upper extremity supported;Feet unsupported Sitting balance-Leahy Scale: Good     Standing balance support: During functional activity Standing balance-Leahy Scale: Fair Standing balance comment: Pt was able to stand some unsupported in room to complete adls. Pt could not accept great challenges without assist.                           ADL either performed or assessed with clinical judgement   ADL Overall ADL's : Needs assistance/impaired Eating/Feeding: Set up;Sitting   Grooming: Wash/dry hands;Wash/dry face;Oral care;Set up;Standing   Upper Body Bathing: Set up;Sitting   Lower Body Bathing: Minimal assistance;Sit to/from stand;Cueing for compensatory techniques   Upper Body Dressing : Set up;Sitting   Lower Body Dressing: Minimal assistance;Sit to/from stand;Cueing for compensatory techniques Lower Body Dressing Details (indicate cue type and reason): Pt required cues to sit to dress LE. Pt very unsafe with some dressing techniques putting her at risk for a  fall. Toilet Transfer: Min guard;Grab bars;Comfort height toilet;Ambulation Toilet Transfer Details (indicate cue type and reason): PT walked to bathroom with min guard and no overt loss of balance. Toileting- Water quality scientist and Hygiene: Min  guard;Sit to/from stand;Cueing for compensatory techniques     Tub/Shower Transfer Details (indicate cue type and reason): pt would benefit from shower bench so she would not have to step over the side of the tub. Functional mobility during ADLs: Min guard;Cueing for sequencing General ADL Comments: Pt requires min assist with most adls. Per family, pt is inconsistent with her adls.  Some days she does fine, other days she needs a lot of assist.  Unsure what happens at home to change pt's status. Mother and sister do not think pt is safe to be living home alone although they moved her into her apartment knowing she would be alone just this past Friday.  Feel pt does have some cognitive issues that make her a safety risk.  Pt can currently bathe, dress and do simple adls with very little assist.       Vision Baseline Vision/History: Wears glasses Wears Glasses: At all times Patient Visual Report: No change from baseline Vision Assessment?: No apparent visual deficits     Perception     Praxis      Pertinent Vitals/Pain Pain Assessment: No/denies pain     Hand Dominance Right   Extremity/Trunk Assessment Upper Extremity Assessment Upper Extremity Assessment: Overall WFL for tasks assessed   Lower Extremity Assessment Lower Extremity Assessment: Defer to PT evaluation   Cervical / Trunk Assessment Cervical / Trunk Assessment: Normal   Communication Communication Communication: Tracheostomy   Cognition Arousal/Alertness: Awake/alert Behavior During Therapy: WFL for tasks assessed/performed Overall Cognitive Status: Impaired/Different from baseline Area of Impairment: Orientation                 Orientation Level: Place;Situation             General Comments: Pt knows date. States she is at [facility].  Forgot name of the city (although has only lived here 3 weeks).   General Comments  Pt seems to have a difficult home situation moving here 3 weeks ago from  [place].  Lived with mother and sister for 3 weeks and just moved into her own apartment on Friday and came to hospital within  48 hours with confusion. Pt's family moved her into her apartment but stated she should not be living alone.  They are unsure where they would like her to live.    Exercises     Shoulder Instructions      Home Living Family/patient expects to be discharged to:: Private residence Living Arrangements: Alone Available Help at Discharge: Family Type of Home: Apartment Home Access: Scientist, research (physical sciences) of Steps: flight Entrance Stairs-Rails: Right Home Layout: One level     Bathroom Shower/Tub: Teacher, early years/pre: Handicapped height     Home Equipment: Environmental consultant - 2 wheels;Cane - single point   Additional Comments: Sister and mother present to confirm home set up.      Prior Functioning/Environment Level of Independence: Needs assistance  Gait / Transfers Assistance Needed: has walker but does not use it. ADL's / Homemaking Assistance Needed: mother brings food for pt. Sister pays all bills. Pt just moved into apartment of her own this past friday when confusion began and pt could not care for self.   Comments: Pt states family available. Spoke with mother and sister where pt  lived for past 3 weeks. Prior to this pt lived in [place].  Family willing to help some but they said it is too crowded for pt to live with them but that they also think she should not live in this new apartement by herself either.  They are not willing to provide 24/7 assist to this pt.        OT Problem List: Decreased cognition;Impaired balance (sitting and/or standing)      OT Treatment/Interventions: Self-care/ADL training;Balance training;Therapeutic activities    OT Goals(Current goals can be found in the care plan section) Acute Rehab OT Goals Patient Stated Goal: to go home OT Goal Formulation: With patient/family Time For Goal Achievement:  06/20/19 Potential to Achieve Goals: Fair ADL Goals Pt Will Perform Grooming: with modified independence;standing Pt Will Perform Lower Body Bathing: with modified independence;sit to/from stand Pt Will Perform Lower Body Dressing: with modified independence;sit to/from stand Pt Will Perform Tub/Shower Transfer: with supervision;Tub transfer;ambulating;tub bench Additional ADL Goal #1: Pt will walk to bathroom and toilet on 3:1 over commode with mod I. Additional ADL Goal #2: Pt will answer 4/4 safety questions with 100% accuracy and no assist.  OT Frequency: Min 2X/week   Barriers to D/C: Decreased caregiver support  Pt lives alone. Does not have 24/7 assist. Mother and daughter stated they cannot give her 24/7 assist. They can assist at times but not 24/7.       Co-evaluation              AM-PAC OT "6 Clicks" Daily Activity     Outcome Measure Help from another person eating meals?: A Little Help from another person taking care of personal grooming?: None Help from another person toileting, which includes using toliet, bedpan, or urinal?: A Little Help from another person bathing (including washing, rinsing, drying)?: A Little Help from another person to put on and taking off regular upper body clothing?: None Help from another person to put on and taking off regular lower body clothing?: A Little 6 Click Score: 20   End of Session Equipment Utilized During Treatment: Gait belt Nurse Communication: Mobility status  Activity Tolerance: Patient tolerated treatment well Patient left: in chair;with call bell/phone within reach;with chair alarm set  OT Visit Diagnosis: Unsteadiness on feet (R26.81)                Time: 5449-2010 OT Time Calculation (min): 21 min Charges:  OT General Charges $OT Visit: 1 Visit OT Evaluation $OT Eval Moderate Complexity: 1 Mod  Glenford Peers 06/06/2019, 11:32 AM

## 2019-06-06 NOTE — TOC Initial Note (Signed)
Transition of Care [facility]) - Initial/Assessment Note    Patient Details  Name: Judy Meza MRN: [id number] Date of Birth: 06-21-1960  Transition of Care [facility]) CM/SW Contact:    Joaquin Courts, RN Phone Number: 06/06/2019, 1:57 PM  Clinical Narrative:     CM spoke with patient at bedside regarding recommendation for SNF.  Patient is in agreement initially but became hesitant once CM discussed need for patient to sign over her check to facility for the duration of her stay.  Patient has an apartment and was concerned about her need to pay for that in order to return once facility stay is complete.  Patient requests CM speka with her sister Judy Meza regarding this.  Patient also informed she will need to remain at facility for 30 days and she is agreeable to this.  CM spoke with Judy Meza and went over the detail of the conversation with patient.  Judy Meza expresses that she feels patient will benefit most from a SNF stay in order for her to get stronger and return to her apartment, she is willing to cover the cost of patients rent while she is at the facility so that patient does not have to worry about signing over her check.  Additionally, CM provided Judy Meza with CAP program information as this may help the patient long term with her personal care needs and Judy Meza expresses that she can help patient work on this once she returns home from the facility. CM spoke with patient and updated her on the conversation with Judy Meza and patient is in agreement to explore SNF options now that the cost of her rent is covered and she is assured that she can return to her apartment after rehab stay.   FL2 was sent out to all area facilities, will continue to follow and present bed offers once available.              Expected Discharge Plan: Skilled Nursing Facility Barriers to Discharge: Continued Medical Work up   Patient Goals and CMS Choice Patient states their goals for this hospitalization and  ongoing recovery are:: to go to rehab facility CMS Medicare.gov Compare Post Acute Care list provided to:: Patient Choice offered to / list presented to : Patient  Expected Discharge Plan and Services Expected Discharge Plan: [place]   Discharge Planning Services: CM Consult Post Acute Care Choice: [place] Living arrangements for the past 2 months: Apartment                 DME Arranged: N/A DME Agency: NA       HH Arranged: NA HH Agency: NA        Prior Living Arrangements/Services Living arrangements for the past 2 months: Apartment Lives with:: Self Patient language and need for interpreter reviewed:: Yes Do you feel safe going back to the place where you live?: Yes      Need for Family Participation in Patient Care: Yes (Comment) Care giver support system in place?: Yes (comment)   Criminal Activity/Legal Involvement Pertinent to Current Situation/Hospitalization: No - Comment as needed  Activities of Daily Living Home Assistive Devices/Equipment: Cane (specify quad or straight), Nebulizer, Walker (specify type), Other (Comment)(single point cane, old trach stoma, front wheeled walker) ADL Screening (condition at time of admission) Patient's cognitive ability adequate to safely complete daily activities?: Yes Is the patient deaf or have difficulty hearing?: No Does the patient have difficulty seeing, even when wearing glasses/contacts?: No Does the patient have difficulty concentrating,  remembering, or making decisions?: Yes Patient able to express need for assistance with ADLs?: Yes Does the patient have difficulty dressing or bathing?: Yes Independently performs ADLs?: No Communication: Independent Dressing (OT): Needs assistance Is this a change from baseline?: Change from baseline, expected to last <3days Grooming: Needs assistance Is this a change from baseline?: Change from baseline, expected to last <3 days Feeding: Needs  assistance Is this a change from baseline?: Change from baseline, expected to last <3 days Bathing: Needs assistance Is this a change from baseline?: Change from baseline, expected to last <3 days Toileting: Needs assistance Is this a change from baseline?: Change from baseline, expected to last <3 days In/Out Bed: Needs assistance Is this a change from baseline?: Change from baseline, expected to last <3 days Walks in Home: Needs assistance Is this a change from baseline?: Change from baseline, expected to last <3 days Does the patient have difficulty walking or climbing stairs?: Yes Weakness of Legs: Both Weakness of Arms/Hands: None  Permission Sought/Granted                  Emotional Assessment Appearance:: Appears stated age Attitude/Demeanor/Rapport: Engaged Affect (typically observed): Accepting Orientation: : Oriented to Self, Oriented to Place, Oriented to  Time, Oriented to Situation   Psych Involvement: No (comment)  Admission diagnosis:  Acute encephalopathy [G93.40] AKI (acute kidney injury) ([place]) [N17.9] Acute hepatic encephalopathy [K72.00] Patient Active Problem List   Diagnosis Date Noted  . Acute encephalopathy 06/05/2019  . Chest pain 06/04/2019  . QT prolongation 06/04/2019  . Metabolic acidosis 23/53/6144  . Dementia without behavioral disturbance ([place]) 05/10/2019  . Chronic hepatitis C without hepatic coma ([place]) 05/10/2019  . Hepatic encephalopathy ([place]) 05/09/2019  . Pancytopenia ([place])   . Cirrhosis of liver ([place])   . CKD (chronic kidney disease), stage III 04/29/2019  . COPD with chronic bronchitis ([place]) 04/29/2019  . Tobacco abuse 04/29/2019  . Alcoholism in remission ([place]) 04/29/2019  . Acute hepatic encephalopathy 04/29/2019  . AKI (acute kidney injury) ([place]) 04/29/2019  . Alcoholic cirrhosis of liver with ascites ([place]) 04/29/2019  . Thrombocytopenia ([place]) 04/29/2019  . Hyperkalemia 04/29/2019   PCP:  Patient, No Pcp Per Pharmacy:    [id number] - [place], [address] [place] [address] Phone: [phone] Fax: [phone]     Social Determinants of Health (SDOH) Interventions    Readmission Risk Interventions Readmission Risk Prevention Plan 06/06/2019  Transportation Screening Complete  Medication Review Press photographer) Complete  HRI or Home Care Consult Complete  SW Recovery Care/Counseling Consult Complete  Palliative Care Screening Not Applicable  Skilled Nursing Facility Complete

## 2019-06-06 NOTE — Progress Notes (Signed)
PHARMACIST - PHYSICIAN COMMUNICATION  DR:   Tana Coast  CONCERNING: IV to Oral Route Change Policy  RECOMMENDATION: This patient is receiving folic acid by the intravenous route.  Based on criteria approved by the Pharmacy and Therapeutics Committee, the intravenous medication(s) is/are being converted to the equivalent oral dose form(s).   DESCRIPTION: These criteria include:  The patient is eating (either orally or via tube) and/or has been taking other orally administered medications for a least 24 hours  The patient has no evidence of active gastrointestinal bleeding or impaired GI absorption (gastrectomy, short bowel, patient on TNA or NPO).  If you have questions about this conversion, please contact the Pharmacy Department  []   [phone] )  [facility] []   [phone] )  [facility] []   [phone] )  [facility] []   [phone] )  [facility] [x]   [phone] )  [place], PharmD, [place]: [phone] 06/06/2019 11:14 AM

## 2019-06-06 NOTE — Progress Notes (Signed)
Pt sister Tonianne Fine [phone] came to visit pt and stated along with pt, she is agreeable to transfer to a Rehab facility at discharge. Message left for Care Management and MD to review. SRP, RN

## 2019-06-06 NOTE — Progress Notes (Signed)
[facility]                                                                              Patient Demographics  Judy Meza, is a 59 y.o. female, DOB - 04-28-1960, TWS:[id number]  Admit date - 06/04/2019   Admitting Physician Toy Baker, MD  Outpatient Primary MD for the patient is Patient, No Pcp Per  Outpatient specialists:   LOS - 1  days   Medical records reviewed and are as summarized below:    Chief Complaint  Patient presents with  . Altered Mental Status       Brief summary   Patient is a 59 year old female with history of cirrhosis, history of alcohol and drug use, COPD, hypertension, dementia, CKD stage III, tobacco use ongoing status post tracheostomy due to throat cancer in May 2020, hepatitis C presented with acute encephalopathy on the day of admission.  Patient originally from [place], moved to [place] with her family for the past 1 year.  Patient's family used to visit every day to help administer medications, last time seen a day before the admission, she was doing well.  She took some night medications on the morning of admission.  Patient sister checked on her but she was too confused and having trouble opening the door.  Patient sister also noticed she had asterixis which she knows in the past worse explained by elevated ammonia.  Family also reported patient having hallucinations.  Sister gave her lactulose and brought her to the ED. Patient had 2 BMs since then and which improved her mentation.  Patient's family noticed that she had run out of this rifaximin few days ago and she had burned her ground when cooking since she has been living by herself for the past 3 days.  Family is also concerned that she is unsafe at home by herself and at risk of being lost. COVID-19 PCR negative Ammonia level 64 at the time of admission, creatinine 2.47, BUN 38, creatinine was 1.2 on 05/11/2019  Assessment & Plan     Principal problem  Acute  hepatic encephalopathy -Likely due to noncompliance with rifaximin, lactulose -Ammonia level 64 at the time of admission -Patient was started on lactulose and rifaximin, today alert and oriented, appears at her baseline   Active problems Alcoholic liver cirrhosis with ascites, thrombocytopenia -Patient has not required paracentesis for the past 6 weeks, will need to have follow-up with GI, established with [facility].  Abdomen soft, does not need urgent paracentesis -Currently no abdominal pain or shortness of breath  Acute kidney injury on CKD (chronic kidney disease), stage III, dehydration -At baseline, creatinine 1.2, presented with creatinine of 2.47 -Creatinine improved to 1.0 -DC IV fluids   COPD with chronic bronchitis (HCC) -No wheezing, continue albuterol as needed   Tobacco abuse -Continue nicotine patch  History of hepatitis C -Continue sofosbuvir-velpatasvir   History of alcohol use -Family reported that she is not drinking lately, however patient has been living alone for the last 3 days  -closely monitor for any EtOH withdrawal   History of throat cancer -s/p tracheostomy, will need to establish care with oncology locally in  [place] - will send a referral to oncology at the time of discharge.  Dementia, generalized debility [place], with risk of sundowning, clearly a risk of living by herself. PT evaluation done, needs full supervision as patient's mental status waxes and wanes with hepatic encephalopathy and her compliance with the lactulose and rifaximin  Code Status: DNR DVT Prophylaxis:  SCD's Family Communication: Discussed all imaging results, lab results, explained to the patient.  Patient alert and oriented  Disposition Plan:     Status is: Inpatient, needs skilled nursing facility, social work consult placed  The patient will require care spanning > 2 midnights and should be moved to inpatient because: Inpatient level of care appropriate due to  severity of illness.  Patient will be ready for discharge as SNF bed is available currently no acute medical issues  Dispo: The patient is from: Home              Anticipated d/c is to:SNF              Anticipated d/c date is: 2 days              Patient currently is not medically stable to d/c.      Time Spent in minutes 35 minutes  Procedures:  None  Consultants:   None  Antimicrobials:   Anti-infectives (From admission, onward)   Start     Dose/Rate Route Frequency Ordered Stop   06/05/19 1000  rifaximin (XIFAXAN) tablet 550 mg     550 mg Oral 2 times daily 06/05/19 0204     06/05/19 1000  Sofosbuvir-Velpatasvir 400-100 MG TABS 1 tablet     1 tablet Oral Daily 06/05/19 0204           Medications  Scheduled Meds: . amLODipine  5 mg Oral Daily  . feeding supplement (ENSURE ENLIVE)  237 mL Oral BID BM  . folic acid  1 mg Oral Daily  . lactulose  20 g Oral TID  . loratadine  10 mg Oral Daily  . multivitamin with minerals  1 tablet Oral Daily  . nicotine  21 mg Transdermal Daily  . pantoprazole  40 mg Oral Daily  . PARoxetine  40 mg Oral Daily  . polyvinyl alcohol  1 drop Both Eyes TID  . rifaximin  550 mg Oral BID  . sodium chloride flush  3 mL Intravenous Q12H  . Sofosbuvir-Velpatasvir  1 tablet Oral Daily  . thiamine  100 mg Oral Daily   Continuous Infusions:  PRN Meds:.acetaminophen **OR** acetaminophen, albuterol, albuterol, hydrOXYzine      Subjective:   Zamyia Gowell was seen and examined today.  Alert and oriented today, was resumed on lactulose and rifaximin.  States having itching and dry eyes otherwise no complaints.   Patient denies dizziness, chest pain, shortness of breath, abdominal pain, N/V/D/C, new weakness, numbess, tingling.  Objective:   Vitals:   06/05/19 2359 06/06/19 0423 06/06/19 0700 06/06/19 1100  BP: 112/63 119/73    Pulse: 96 95  94  Resp: 16 19 17  (!) 21  Temp: 97.9 F (36.6 C) 97.8 F (36.6 C)    TempSrc: Oral Oral     SpO2: 100% 99% 98% 99%  Weight:      Height:        Intake/Output Summary (Last 24 hours) at 06/06/2019 1304 Last data filed at 06/06/2019 1303 Gross per 24 hour  Intake 980.56 ml  Output --  Net 980.56 ml     Wt  Readings from Last 3 Encounters:  06/05/19 54 kg  06/01/19 54.3 kg  05/22/19 51.7 kg   Physical Exam  General: Alert and oriented x 3, NAD  Cardiovascular: S1 S2 clear, RRR. No pedal edema b/l  Respiratory: CTAB, no wheezing, rales or rhonchi  Gastrointestinal: nontender, soft and mildly distended, NBS  Ext: no pedal edema bilaterally  Neuro: no new deficits  Musculoskeletal: No cyanosis, clubbing  Skin: No rashes  Psych: Normal affect and demeanor, alert and oriented x3    Data Reviewed:  I have personally reviewed following labs and imaging studies  Micro Results Recent Results (from the past 240 hour(s))  SARS Coronavirus 2 by RT PCR (hospital order, performed in [place] hospital lab) Nasopharyngeal Nasopharyngeal Swab     Status: None   Collection Time: 06/04/19  4:28 PM   Specimen: Nasopharyngeal Swab  Result Value Ref Range Status   SARS Coronavirus 2 NEGATIVE NEGATIVE Final    Comment: (NOTE) SARS-CoV-2 target nucleic acids are NOT DETECTED. The SARS-CoV-2 RNA is generally detectable in upper and lower respiratory specimens during the acute phase of infection. The lowest concentration of SARS-CoV-2 viral copies this assay can detect is 250 copies / mL. A negative result does not preclude SARS-CoV-2 infection and should not be used as the sole basis for treatment or other patient management decisions.  A negative result may occur with improper specimen collection / handling, submission of specimen other than nasopharyngeal swab, presence of viral mutation(s) within the areas targeted by this assay, and inadequate number of viral copies (<250 copies / mL). A negative result must be combined with clinical observations, patient history,  and epidemiological information. Fact Sheet for Patients:   StrictlyIdeas.no Fact Sheet for Healthcare Providers: BankingDealers.co.za This test is not yet approved or cleared  by the [place] FDA and has been authorized for detection and/or diagnosis of SARS-CoV-2 by FDA under an Emergency Use Authorization (EUA).  This EUA will remain in effect (meaning this test can be used) for the duration of the COVID-19 declaration under Section 564(b)(1) of the Act, 21 U.S.C. section 360bbb-3(b)(1), unless the authorization is terminated or revoked sooner. Performed at [facility], [place] [address] [place],  [address]     Radiology Reports CT Head Wo Contrast  Result Date: 06/04/2019 CLINICAL DATA:  Confusion, hyperammonemia, cirrhosis EXAM: CT HEAD WITHOUT CONTRAST TECHNIQUE: Contiguous axial images were obtained from the base of the skull through the vertex without intravenous contrast. COMPARISON:  04/29/2019 FINDINGS: Brain: Chronic small vessel ischemic changes are seen in the periventricular white matter most pronounced in the left frontal lobe, stable. No signs of acute infarct or hemorrhage. Lateral ventricles and midline structures are unremarkable. No acute extra-axial fluid collections. No mass effect. Vascular: No hyperdense vessel or unexpected calcification. Skull: Normal. Negative for fracture or focal lesion. Sinuses/Orbits: No acute finding. Other: None. IMPRESSION: 1. Stable chronic small vessel ischemic changes, no acute process. Electronically Signed   By: Randa Ngo M.D.   On: 06/04/2019 19:44   DG Chest Port 1 View  Result Date: 06/04/2019 CLINICAL DATA:  Chest pain. EXAM: PORTABLE CHEST 1 VIEW COMPARISON:  April 29, 2019 FINDINGS: Multiple overlying radiopaque cardiac lead wires are seen. There is no evidence of acute infiltrate, pleural effusion or pneumothorax. The heart size and mediastinal  contours are within normal limits. The visualized skeletal structures are unremarkable. IMPRESSION: No active disease. Electronically Signed   By: Virgina Norfolk M.D.   On: 06/04/2019 18:13  DG Chest Port 1 View  Result Date: 05/09/2019 CLINICAL DATA:  Altered mental status. EXAM: PORTABLE CHEST 1 VIEW COMPARISON:  April 29, 2019. FINDINGS: The heart size and mediastinal contours are within normal limits. Both lungs are clear. The visualized skeletal structures are unremarkable. IMPRESSION: No active disease. Electronically Signed   By: Marijo Conception M.D.   On: 05/09/2019 10:46   ECHOCARDIOGRAM COMPLETE  Result Date: 06/05/2019    ECHOCARDIOGRAM REPORT   Patient Name:   CHANTELL KUNKLER Date of Exam: 06/05/2019 Medical Rec #:  [id number]     Height:       62.0 in Accession #:    [id number]    Weight:       119.8 lb Date of Birth:  [birth year]     BSA:          1.538 m Patient Age:    80 years      BP:           140/91 mmHg Patient Gender: F             HR:           86 bpm. Exam Location:  Inpatient Procedure: 2D Echo, Cardiac Doppler and Color Doppler Indications:    Chest Pain 786.50 / R07.9  History:        Patient has no prior history of Echocardiogram examinations.                 COPD, Signs/Symptoms:Chest Pain; Risk Factors:Current Smoker.  Sonographer:    Vickie Epley RDCS Referring Phys: [id number] Rodman  1. Left ventricular ejection fraction, by estimation, is 65 to 70%. The left ventricle has normal function. The left ventricle has no regional wall motion abnormalities. Left ventricular diastolic parameters are consistent with Grade II diastolic dysfunction (pseudonormalization).  2. Right ventricular systolic function is normal. The right ventricular size is normal. There is normal pulmonary artery systolic pressure.  3. The mitral valve is grossly normal. Trivial mitral valve regurgitation. No evidence of mitral stenosis.  4. The aortic valve is tricuspid. Aortic valve  regurgitation is not visualized. No aortic stenosis is present. FINDINGS  Left Ventricle: Left ventricular ejection fraction, by estimation, is 65 to 70%. The left ventricle has normal function. The left ventricle has no regional wall motion abnormalities. The left ventricular internal cavity size was normal in size. There is  no left ventricular hypertrophy. Left ventricular diastolic parameters are consistent with Grade II diastolic dysfunction (pseudonormalization). Right Ventricle: The right ventricular size is normal. No increase in right ventricular wall thickness. Right ventricular systolic function is normal. There is normal pulmonary artery systolic pressure. The tricuspid regurgitant velocity is 2.54 m/s, and  with an assumed right atrial pressure of 3 mmHg, the estimated right ventricular systolic pressure is 66.4 mmHg. Left Atrium: Left atrial size was normal in size. Right Atrium: Right atrial size was normal in size. Pericardium: There is no evidence of pericardial effusion. Mitral Valve: The mitral valve is grossly normal. Trivial mitral valve regurgitation. No evidence of mitral valve stenosis. Tricuspid Valve: The tricuspid valve is normal in structure. Tricuspid valve regurgitation is trivial. No evidence of tricuspid stenosis. Aortic Valve: The aortic valve is tricuspid. Aortic valve regurgitation is not visualized. No aortic stenosis is present. Pulmonic Valve: The pulmonic valve was grossly normal. Pulmonic valve regurgitation is not visualized. No evidence of pulmonic stenosis. Aorta: The aortic root and ascending aorta are structurally normal, with no evidence of dilitation. IAS/Shunts: The  atrial septum is grossly normal.  LEFT VENTRICLE PLAX 2D LVIDd:         4.80 cm      Diastology LVIDs:         3.20 cm      LV e' lateral:   6.85 cm/s LV PW:         0.80 cm      LV E/e' lateral: 12.6 LV IVS:        0.80 cm      LV e' medial:    5.87 cm/s LVOT diam:     1.60 cm      LV E/e' medial:  14.7 LV  SV:         49 LV SV Index:   32 LVOT Area:     2.01 cm  LV Volumes (MOD) LV vol d, MOD A2C: 116.0 ml LV vol d, MOD A4C: 89.3 ml LV vol s, MOD A2C: 36.4 ml LV vol s, MOD A4C: 37.9 ml LV SV MOD A2C:     79.6 ml LV SV MOD A4C:     89.3 ml LV SV MOD BP:      67.3 ml RIGHT VENTRICLE RV S prime:     12.80 cm/s TAPSE (M-mode): 1.9 cm LEFT ATRIUM             Index       RIGHT ATRIUM           Index LA diam:        3.50 cm 2.28 cm/m  RA Area:     14.90 cm LA Vol (A2C):   35.9 ml 23.35 ml/m RA Volume:   38.20 ml  24.85 ml/m LA Vol (A4C):   39.4 ml 25.63 ml/m LA Biplane Vol: 38.6 ml 25.11 ml/m  AORTIC VALVE LVOT Vmax:   110.00 cm/s LVOT Vmean:  76.400 cm/s LVOT VTI:    0.242 m  AORTA Ao Root diam: 3.30 cm MITRAL VALVE               TRICUSPID VALVE MV Area (PHT): 4.21 cm    TR Peak grad:   25.8 mmHg MV Decel Time: 180 msec    TR Vmax:        254.00 cm/s MR Peak grad: 98.4 mmHg MR Vmax:      496.00 cm/s  SHUNTS MV E velocity: 86.50 cm/s  Systemic VTI:  0.24 m MV A velocity: 98.10 cm/s  Systemic Diam: 1.60 cm MV E/A ratio:  0.88 Mertie Moores MD Electronically signed by Mertie Moores MD Signature Date/Time: 06/05/2019/11:06:23 AM    Final     Lab Data:  CBC: Recent Labs  Lab 06/04/19 1702 06/05/19 0540 06/06/19 0430  WBC 5.0 4.4 4.6  NEUTROABS  --  3.3  --   HGB 9.5* 9.6* 8.4*  HCT 28.6* 28.9* 25.9*  MCV 98.3 98.6 100.8*  PLT 84* 84* 66*   Basic Metabolic Panel: Recent Labs  Lab 06/04/19 1702 06/04/19 2017 06/05/19 0609 06/06/19 0430  NA 132*  --  131* 130*  K 4.2  --  4.1 4.2  CL 101  --  101 106  CO2 20*  --  20* 18*  GLUCOSE 113*  --  125* 125*  BUN 38*  --  28* 22*  CREATININE 2.47*  --  1.53* 1.09*  CALCIUM 9.3  --  8.6* 8.5*  MG  --  1.9 1.9  --   PHOS  --  3.6 3.8  --  GFR: Estimated Creatinine Clearance: 45.6 mL/min (A) (by C-G formula based on SCr of 1.09 mg/dL (H)). Liver Function Tests: Recent Labs  Lab 06/04/19 1702 06/05/19 0609 06/06/19 0430  AST 56* 49* 47*   ALT 30 29 27   ALKPHOS 103 94 99  BILITOT 1.9* 2.3* 1.2  PROT 8.0 7.2 6.2*  ALBUMIN 3.6 3.3* 2.7*   No results for input(s): LIPASE, AMYLASE in the last 168 hours. Recent Labs  Lab 06/04/19 1605 06/05/19 0609 06/06/19 0430  AMMONIA 64* 35 65*   Coagulation Profile: Recent Labs  Lab 06/04/19 1846  INR 1.5*   Cardiac Enzymes: Recent Labs  Lab 06/04/19 2017  CKTOTAL 138   BNP (last 3 results) No results for input(s): PROBNP in the last 8760 hours. HbA1C: No results for input(s): HGBA1C in the last 72 hours. CBG: No results for input(s): GLUCAP in the last 168 hours. Lipid Profile: No results for input(s): CHOL, HDL, LDLCALC, TRIG, CHOLHDL, LDLDIRECT in the last 72 hours. Thyroid Function Tests: Recent Labs    06/05/19 0609  TSH 0.992   Anemia Panel: No results for input(s): VITAMINB12, FOLATE, FERRITIN, TIBC, IRON, RETICCTPCT in the last 72 hours. Urine analysis:    Component Value Date/Time   COLORURINE YELLOW 06/04/2019 1802   APPEARANCEUR HAZY (A) 06/04/2019 1802   LABSPEC 1.010 06/04/2019 1802   PHURINE 6.0 06/04/2019 1802   GLUCOSEU NEGATIVE 06/04/2019 1802   HGBUR SMALL (A) 06/04/2019 1802   BILIRUBINUR NEGATIVE 06/04/2019 1802   KETONESUR NEGATIVE 06/04/2019 1802   PROTEINUR NEGATIVE 06/04/2019 1802   NITRITE NEGATIVE 06/04/2019 1802   LEUKOCYTESUR SMALL (A) 06/04/2019 1802     Merit Maybee M.D. [facility] 06/06/2019, 1:04 PM   Call night coverage person covering after 7pm

## 2019-06-06 NOTE — Plan of Care (Signed)
  Problem: Education: Goal: Knowledge of General Education information will improve Description Including pain rating scale, medication(s)/side effects and non-pharmacologic comfort measures Outcome: Progressing   

## 2019-06-06 NOTE — Progress Notes (Signed)
Initial Nutrition Assessment  DOCUMENTATION CODES:   Not applicable  INTERVENTION:  - continue Ensure Enlive BID, each supplement provides 350 kcal and 20 grams protein.  NUTRITION DIAGNOSIS:   Increased nutrient needs related to acute illness as evidenced by estimated needs.  GOAL:   Patient will meet greater than or equal to 90% of their needs  MONITOR:   PO intake, Supplement acceptance, Labs, Weight trends  REASON FOR ASSESSMENT:   Consult Malnutrition Eval  ASSESSMENT:   59 y.o. female with medical history of cirrhosis, alcohol abuse and IVDU, COPD, HTN, dementia, stage 3 CKD, tobacco abuse (ongoing), s/p tracheostomy d/t throat cancer in May 2020, and hepatitis C. She presented to the ED with acute onset of encephalopathy and family reporting patient has been having hallucinations.  Patient ate 75% of breakfast and lunch today and Ensure was ordered BID yesterday and she has accepted all bottles of supplement since that time. Patient denies any issues with chewing or swallowing, no nausea or abdominal pain/pressure now or PTA. She denies any recent changes in appetite or intakes.   Patient denies any recent weight changes. Per chart review, weight yesterday was 119 lb and weight has been trending up over the past 1 month; notes indicate ascites.   Per notes: - acute hepatic encephalopathy - alcoholic liver cirrhosis with ascites--no need for paracentesis at this time. - AKI on stage 3 CKD - hx of dementia and generalized debility - plan for SNF at the time of d/c   Labs reviewed; Na: 130 mmol/l, BUN: 22 mg/dl, creatinine: 1.09 mg/dl, Ca: 8.5 mg/dl, GFR: 57 ml/min. Medications reviewed; 1 mg folvite/day, 20 g lactulose TID, 1 tablet multivitamin with minerals/day, 100 mg oral thiamine/day.     NUTRITION - FOCUSED PHYSICAL EXAM:  completed to upper body; no muscle or fat wasting.   Diet Order:   Diet Order            Diet Heart Room service appropriate? Yes;  Fluid consistency: Thin  Diet effective now              EDUCATION NEEDS:   No education needs have been identified at this time  Skin:  Skin Assessment: Reviewed RN Assessment  Last BM:  5/24  Height:   Ht Readings from Last 1 Encounters:  06/05/19 5\' 2"  (1.575 m)    Weight:   Wt Readings from Last 1 Encounters:  06/05/19 54 kg     Estimated Nutritional Needs:  Kcal:  1620-1890 kcal Protein:  75-85 grams Fluid:  >/= 2 L/day     Jarome Matin, MS, RD, LDN, CNSC Inpatient Clinical Dietitian RD pager # available in [facility]  After hours/weekend pager # available in [facility]

## 2019-06-07 LAB — COMPREHENSIVE METABOLIC PANEL
ALT: 29 U/L (ref 0–44)
AST: 56 U/L — ABNORMAL HIGH (ref 15–41)
Albumin: 2.8 g/dL — ABNORMAL LOW (ref 3.5–5.0)
Alkaline Phosphatase: 89 U/L (ref 38–126)
Anion gap: 7 (ref 5–15)
BUN: 23 mg/dL — ABNORMAL HIGH (ref 6–20)
CO2: 20 mmol/L — ABNORMAL LOW (ref 22–32)
Calcium: 8.6 mg/dL — ABNORMAL LOW (ref 8.9–10.3)
Chloride: 104 mmol/L (ref 98–111)
Creatinine, Ser: 1.2 mg/dL — ABNORMAL HIGH (ref 0.44–1.00)
GFR calc Af Amer: 58 mL/min — ABNORMAL LOW (ref >60)
GFR calc non Af Amer: 50 mL/min — ABNORMAL LOW (ref >60)
Glucose, Bld: 99 mg/dL (ref 70–99)
Potassium: 4.6 mmol/L (ref 3.5–5.1)
Sodium: 131 mmol/L — ABNORMAL LOW (ref 135–145)
Total Bilirubin: 0.8 mg/dL (ref 0.3–1.2)
Total Protein: 6.5 g/dL (ref 6.5–8.1)

## 2019-06-07 LAB — CBC
HCT: 26.9 % — ABNORMAL LOW (ref 36.0–46.0)
Hemoglobin: 9.1 g/dL — ABNORMAL LOW (ref 12.0–15.0)
MCH: 33.8 pg (ref 26.0–34.0)
MCHC: 33.8 g/dL (ref 30.0–36.0)
MCV: 100 fL (ref 80.0–100.0)
Platelets: 74 10*3/uL — ABNORMAL LOW (ref 150–400)
RBC: 2.69 MIL/uL — ABNORMAL LOW (ref 3.87–5.11)
RDW: 16.7 % — ABNORMAL HIGH (ref 11.5–15.5)
WBC: 4.2 10*3/uL (ref 4.0–10.5)
nRBC: 0 % (ref 0.0–0.2)

## 2019-06-07 MED ORDER — THIAMINE HCL 100 MG PO TABS: 100.0000 mg | ORAL_TABLET | Freq: Every day | ORAL | Status: AC

## 2019-06-07 NOTE — Progress Notes (Signed)
Pt discharged to nursing home, [facility], Report called to [facility], LPN. SRP, RN

## 2019-06-07 NOTE — TOC Progression Note (Signed)
Transition of Care [facility]) - Progression Note    Patient Details  Name: Calah Gershman MRN: [id number] Date of Birth: 10-15-1960  Transition of Care [facility]) CM/SW Contact  Purcell Mouton, RN Phone Number: 06/07/2019, 12:02 PM  Clinical Narrative:    Went over bed offers with pt's sisters at bedside on the telephone with [name].   Expected Discharge Plan: [place] Barriers to Discharge: Continued Medical Work up  Expected Discharge Plan and Services Expected Discharge Plan: [place]   Discharge Planning Services: CM Consult Post Acute Care Choice: [place] Living arrangements for the past 2 months: Apartment                 DME Arranged: N/A DME Agency: NA       HH Arranged: NA HH Agency: NA         Social Determinants of Health (SDOH) Interventions    Readmission Risk Interventions Readmission Risk Prevention Plan 06/06/2019  Transportation Screening Complete  Medication Review [employer] photographer) Complete  HRI or Home Care Consult Complete  SW Recovery Care/Counseling Consult Complete  Palliative Care Screening Not Applicable  Skilled Nursing Facility Complete

## 2019-06-07 NOTE — TOC Progression Note (Signed)
Transition of Care [facility]) - Progression Note    Patient Details  Name: Judy Meza MRN: [id number] Date of Birth: Jun 09, 1960  Transition of Care [facility]) CM/SW Contact  Purcell Mouton, RN Phone Number: 06/07/2019, 2:15 PM  Clinical Narrative:    Transportation was called, RN made aware.    Expected Discharge Plan: [place] Barriers to Discharge: Continued Medical Work up  Expected Discharge Plan and Services Expected Discharge Plan: [place]   Discharge Planning Services: CM Consult Post Acute Care Choice: [place] Living arrangements for the past 2 months: Apartment Expected Discharge Date: 06/07/19               DME Arranged: N/A DME Agency: NA       HH Arranged: NA HH Agency: NA         Social Determinants of Health (SDOH) Interventions    Readmission Risk Interventions Readmission Risk Prevention Plan 06/06/2019  Transportation Screening Complete  Medication Review Press photographer) Complete  HRI or Home Care Consult Complete  SW Recovery Care/Counseling Consult Complete  Palliative Care Screening Not Applicable  Skilled Nursing Facility Complete

## 2019-06-07 NOTE — TOC Progression Note (Signed)
Transition of Care [facility]) - Progression Note    Patient Details  Name: Chizara Mena MRN: [id number] Date of Birth: 16-Feb-1960  Transition of Care [facility]) CM/SW Contact  Purcell Mouton, RN Phone Number: 06/07/2019, 12:59 PM  Clinical Narrative:    Pt's sister [name] via telephone who selected [facility] in [place] for SNF. [name] asked that Shirlean Mylar [phone] be called if anything is needed.[place] was called spoke with Admission Coordinator, pt can come today.    Expected Discharge Plan: [place] Barriers to Discharge: Continued Medical Work up  Expected Discharge Plan and Services Expected Discharge Plan: [place]   Discharge Planning Services: CM Consult Post Acute Care Choice: [place] Living arrangements for the past 2 months: Apartment                 DME Arranged: N/A DME Agency: NA       HH Arranged: NA HH Agency: NA         Social Determinants of Health (SDOH) Interventions    Readmission Risk Interventions Readmission Risk Prevention Plan 06/06/2019  Transportation Screening Complete  Medication Review Press photographer) Complete  HRI or Home Care Consult Complete  SW Recovery Care/Counseling Consult Complete  Palliative Care Screening Not Applicable  Skilled Nursing Facility Complete

## 2019-06-07 NOTE — Plan of Care (Signed)
  Problem: Education: Goal: Knowledge of General Education information will improve Description Including pain rating scale, medication(s)/side effects and non-pharmacologic comfort measures Outcome: Progressing   

## 2019-06-07 NOTE — Discharge Summary (Signed)
Physician Discharge Summary   Patient ID: Judy Meza MRN: [id number] DOB/AGE: [id number] y.o.  Admit date: 06/04/2019 Discharge date: 06/07/2019  Primary Care Physician:  Patient, No Pcp Per   Recommendations for Outpatient Follow-up:  1. Patient established with [facility], recommended to make a follow-up appointment within 2 weeks 2. Restart Aldactone 50 mg daily, check bmet on Monday, 06/12/2019 3. Continue lactulose 9ml p.o. 3 times daily, titrate for goal 2-3 BMs in a day, continue rifaximin  Home Health: Patient discharging to skilled nursing facility Equipment/Devices:   Discharge Condition: stable CODE STATUS: DNR Diet recommendation: Heart healthy diet   Discharge Diagnoses:   . Acute hepatic encephalopathy . AKI (acute kidney injury) ([place]) . Alcoholic cirrhosis of liver with ascites ([place]) . CKD (chronic kidney disease), stage III . COPD with chronic bronchitis ([place]) . Tobacco abuse . Thrombocytopenia ([place]) . Dementia without behavioral disturbance ([place]) . Chronic hepatitis C without hepatic coma ([place]) . QT prolongation . Generalized debility   Consults: None   Allergies:  No Known Allergies   DISCHARGE MEDICATIONS: Allergies as of 06/07/2019   No Known Allergies     Medication List    STOP taking these medications   amLODipine 5 MG tablet Commonly known as: NORVASC   furosemide 20 MG tablet Commonly known as: LASIX   gabapentin 250 MG/5ML solution Commonly known as: NEURONTIN     TAKE these medications   albuterol (2.5 MG/3ML) 0.083% nebulizer solution Commonly known as: PROVENTIL Take 3 mLs (2.5 mg total) by nebulization every 6 (six) hours as needed for wheezing or shortness of breath.   albuterol 108 (90 Base) MCG/ACT inhaler Commonly known as: VENTOLIN HFA Inhale 2 puffs into the lungs every 6 (six) hours as needed for wheezing or shortness of breath.   feeding supplement (ENSURE ENLIVE) Liqd Take 237 mLs by mouth 2 (two) times  daily between meals.   hydroxypropyl methylcellulose / hypromellose 2.5 % ophthalmic solution Commonly known as: ISOPTO TEARS / GONIOVISC Place 1 drop into both eyes 4 (four) times daily as needed for dry eyes.   hydrOXYzine 10 MG tablet Commonly known as: ATARAX/VISTARIL Take 1 tablet (10 mg total) by mouth 3 (three) times daily as needed for itching or anxiety.   lactulose 10 GM/15ML solution Commonly known as: CHRONULAC Take 30 mLs (20 g total) by mouth 3 (three) times daily.   loratadine 10 MG tablet Commonly known as: CLARITIN Take 1 tablet (10 mg total) by mouth daily.   memantine 5 MG tablet Commonly known as: NAMENDA Take 1 tablet (5 mg total) by mouth 2 (two) times daily. What changed: how much to take   multivitamin with minerals Tabs tablet Take 1 tablet by mouth daily.   nicotine 21 mg/24hr patch Commonly known as: NICODERM CQ - dosed in mg/24 hours Place 1 patch (21 mg total) onto the skin daily.   pantoprazole 40 MG tablet Commonly known as: PROTONIX Take 40 mg by mouth daily.   PARoxetine 40 MG tablet Commonly known as: PAXIL Take 1 tablet (40 mg total) by mouth every morning.   rifaximin 550 MG Tabs tablet Commonly known as: XIFAXAN Take 550 mg by mouth 2 (two) times daily.   Sofosbuvir-Velpatasvir 400-100 MG Tabs Commonly known as: Epclusa Take 1 tablet by mouth daily.   spironolactone 100 MG tablet Commonly known as: ALDACTONE Take 0.5 tablets (50 mg total) by mouth daily. What changed: how much to take   thiamine 100 MG tablet Take 1 tablet (100 mg  total) by mouth daily. Start taking on: Jun 08, 2019   zolpidem 5 MG tablet Commonly known as: AMBIEN Take 0.5 tablets (2.5 mg total) by mouth at bedtime as needed for sleep.        Brief H and P: For complete details please refer to admission H and P, but in brief Patient is a 59 year old female with history of cirrhosis, history of alcohol and drug use, COPD, hypertension, dementia, CKD  stage III, tobacco use ongoing status post tracheostomy due to throat cancer in May 2020, hepatitis C presented with acute encephalopathy on the day of admission.  Patient originally from [place], moved to [place] with her family for the past 1 year.  Patient's family used to visit every day to help administer medications, last time seen a day before the admission, she was doing well.  She took some night medications on the morning of admission.  Patient sister checked on her but she was too confused and having trouble opening the door.  Patient sister also noticed she had asterixis which she knows in the past worse explained by elevated ammonia.  Family also reported patient having hallucinations.  Sister gave her lactulose and brought her to the ED. Patient had 2 BMs since then and which improved her mentation.  Patient's family noticed that she had run out of this rifaximin few days ago and she had burned her ground when cooking since she has been living by herself for the past 3 days.  Family is also concerned that she is unsafe at home by herself and at risk of being lost. COVID-19 PCR negative Ammonia level 64 at the time of admission, creatinine 2.47, BUN 38, creatinine was 1.2 on 05/11/2019   Hospital Course:   Acute hepatic encephalopathy -Likely due to noncompliance with rifaximin, lactulose -Ammonia level 64 at the time of admission -Patient was started on lactulose and rifaximin, now alert and oriented, at baseline.  Confirmed with her sister on the phone.  Patient recommended to be compliant with her medications.  Per sister, she does well in the supervised setting but at home, may have been missing lactulose.    Alcoholic liver cirrhosis with ascites, thrombocytopenia -Patient has not required paracentesis for the past 6 weeks, will need to have follow-up with GI, established [facility].  Abdomen soft, does not need urgent paracentesis -Currently no shortness of breath, abdominal  pain, abdomen soft. -She needs to follow-up with [facility] within next 2 weeks.  Acute kidney injury on CKD (chronic kidney disease), stage III, dehydration -At baseline, creatinine 1.2, presented with creatinine of 2.47 Initially placed on IV fluid hydration  Creatinine improved to 1.2 at baseline.  Patient may restart spironolactone 50 mg daily upon discharge, repeat bmet on Monday, 06/12/2019 for potassium and renal function. -Patient needs to follow-up with [facility] within next 2 weeks, to titrate spironolactone or addition of Lasix if her creatinine function allows.    COPD with chronic bronchitis (HCC) -No wheezing, continue albuterol as needed   Tobacco abuse -Continue nicotine patch  History of hepatitis C -Continue sofosbuvir-velpatasvir, follow outpatient with [facility]   History of alcohol use -Family reported that she is not drinking lately, however patient has been living alone for the last 3 days. Currently no alcohol withdrawals, patient alert and oriented at baseline   History of throat cancer -s/p tracheostomy, will need to establish care with oncology locally in [place] ambulatory referral to oncology.  Patient had moved from [place] a year ago  to [place], has not established with oncology here  Dementia, generalized debility [place], with risk of sundowning, clearly a risk of living by herself. PT evaluation done, needs full supervision as patient's mental status waxes and wanes with hepatic encephalopathy and her compliance with the lactulose and rifaximin   Day of Discharge S: Sitting up in the chair, eating, no acute complaints.  At baseline, alert and oriented.  BP 106/69 (BP Location: Left Arm)   Pulse 92   Temp 98.1 F (36.7 C) (Oral)   Resp 16   Ht 5\' 2"  (1.575 m)   Wt 54 kg   SpO2 96%   BMI 21.77 kg/m   Physical Exam: General: Alert and awake oriented x3 not in any acute distress.   HEENT: anicteric sclera, pupils  reactive to light and accommodation trach+ CVS: S1-S2 clear no murmur rubs or gallops Chest: clear to auscultation bilaterally, no wheezing rales or rhonchi Abdomen: soft nontender, nondistended, normal bowel sounds Extremities: no cyanosis, clubbing or edema noted bilaterally Neuro: Cranial nerves II-XII intact, no focal neurological deficits    Get Medicines reviewed and adjusted: Please take all your medications with you for your next visit with your Primary MD  Please request your Primary MD to go over all hospital tests and procedure/radiological results at the follow up. Please ask your Primary MD to get all Hospital records sent to his/her office.  If you experience worsening of your admission symptoms, develop shortness of breath, life threatening emergency, suicidal or homicidal thoughts you must seek medical attention immediately by calling 911 or calling your MD immediately  if symptoms less severe.  You must read complete instructions/literature along with all the possible adverse reactions/side effects for all the Medicines you take and that have been prescribed to you. Take any new Medicines after you have completely understood and accept all the possible adverse reactions/side effects.   Do not drive when taking pain medications.   Do not take more than prescribed Pain, Sleep and Anxiety Medications  Special Instructions: If you have smoked or chewed Tobacco  in the last 2 yrs please stop smoking, stop any regular Alcohol  and or any Recreational drug use.  Wear Seat belts while driving.  Please note  You were cared for by a hospitalist during your hospital stay. Once you are discharged, your primary care physician will handle any further medical issues. Please note that NO REFILLS for any discharge medications will be authorized once you are discharged, as it is imperative that you return to your primary care physician (or establish a relationship with a primary care  physician if you do not have one) for your aftercare needs so that they can reassess your need for medications and monitor your lab values.   The results of significant diagnostics from this hospitalization (including imaging, microbiology, ancillary and laboratory) are listed below for reference.      Procedures/Studies:  CT Head Wo Contrast  Result Date: 06/04/2019 CLINICAL DATA:  Confusion, hyperammonemia, cirrhosis EXAM: CT HEAD WITHOUT CONTRAST TECHNIQUE: Contiguous axial images were obtained from the base of the skull through the vertex without intravenous contrast. COMPARISON:  04/29/2019 FINDINGS: Brain: Chronic small vessel ischemic changes are seen in the periventricular white matter most pronounced in the left frontal lobe, stable. No signs of acute infarct or hemorrhage. Lateral ventricles and midline structures are unremarkable. No acute extra-axial fluid collections. No mass effect. Vascular: No hyperdense vessel or unexpected calcification. Skull: Normal. Negative for fracture or focal lesion. Sinuses/Orbits:  No acute finding. Other: None. IMPRESSION: 1. Stable chronic small vessel ischemic changes, no acute process. Electronically Signed   By: Randa Ngo M.D.   On: 06/04/2019 19:44   DG Chest Port 1 View  Result Date: 06/04/2019 CLINICAL DATA:  Chest pain. EXAM: PORTABLE CHEST 1 VIEW COMPARISON:  April 29, 2019 FINDINGS: Multiple overlying radiopaque cardiac lead wires are seen. There is no evidence of acute infiltrate, pleural effusion or pneumothorax. The heart size and mediastinal contours are within normal limits. The visualized skeletal structures are unremarkable. IMPRESSION: No active disease. Electronically Signed   By: Virgina Norfolk M.D.   On: 06/04/2019 18:13   DG Chest Port 1 View  Result Date: 05/09/2019 CLINICAL DATA:  Altered mental status. EXAM: PORTABLE CHEST 1 VIEW COMPARISON:  April 29, 2019. FINDINGS: The heart size and mediastinal contours are within  normal limits. Both lungs are clear. The visualized skeletal structures are unremarkable. IMPRESSION: No active disease. Electronically Signed   By: Marijo Conception M.D.   On: 05/09/2019 10:46   ECHOCARDIOGRAM COMPLETE  Result Date: 06/05/2019    ECHOCARDIOGRAM REPORT   Patient Name:   ELDEAN KLATT Date of Exam: 06/05/2019 Medical Rec #:  [id number]     Height:       62.0 in Accession #:    [id number]    Weight:       119.8 lb Date of Birth:  04/10/1960     BSA:          1.538 m Patient Age:    57 years      BP:           140/91 mmHg Patient Gender: F             HR:           86 bpm. Exam Location:  Inpatient Procedure: 2D Echo, Cardiac Doppler and Color Doppler Indications:    Chest Pain 786.50 / R07.9  History:        Patient has no prior history of Echocardiogram examinations.                 COPD, Signs/Symptoms:Chest Pain; Risk Factors:Current Smoker.  Sonographer:    Vickie Epley RDCS Referring Phys: [id number] [place]  1. Left ventricular ejection fraction, by estimation, is 65 to 70%. The left ventricle has normal function. The left ventricle has no regional wall motion abnormalities. Left ventricular diastolic parameters are consistent with Grade II diastolic dysfunction (pseudonormalization).  2. Right ventricular systolic function is normal. The right ventricular size is normal. There is normal pulmonary artery systolic pressure.  3. The mitral valve is grossly normal. Trivial mitral valve regurgitation. No evidence of mitral stenosis.  4. The aortic valve is tricuspid. Aortic valve regurgitation is not visualized. No aortic stenosis is present. FINDINGS  Left Ventricle: Left ventricular ejection fraction, by estimation, is 65 to 70%. The left ventricle has normal function. The left ventricle has no regional wall motion abnormalities. The left ventricular internal cavity size was normal in size. There is  no left ventricular hypertrophy. Left ventricular diastolic parameters are  consistent with Grade II diastolic dysfunction (pseudonormalization). Right Ventricle: The right ventricular size is normal. No increase in right ventricular wall thickness. Right ventricular systolic function is normal. There is normal pulmonary artery systolic pressure. The tricuspid regurgitant velocity is 2.54 m/s, and  with an assumed right atrial pressure of 3 mmHg, the estimated right ventricular systolic pressure is 56.3 mmHg. Left Atrium: Left atrial  size was normal in size. Right Atrium: Right atrial size was normal in size. Pericardium: There is no evidence of pericardial effusion. Mitral Valve: The mitral valve is grossly normal. Trivial mitral valve regurgitation. No evidence of mitral valve stenosis. Tricuspid Valve: The tricuspid valve is normal in structure. Tricuspid valve regurgitation is trivial. No evidence of tricuspid stenosis. Aortic Valve: The aortic valve is tricuspid. Aortic valve regurgitation is not visualized. No aortic stenosis is present. Pulmonic Valve: The pulmonic valve was grossly normal. Pulmonic valve regurgitation is not visualized. No evidence of pulmonic stenosis. Aorta: The aortic root and ascending aorta are structurally normal, with no evidence of dilitation. IAS/Shunts: The atrial septum is grossly normal.  LEFT VENTRICLE PLAX 2D LVIDd:         4.80 cm      Diastology LVIDs:         3.20 cm      LV e' lateral:   6.85 cm/s LV PW:         0.80 cm      LV E/e' lateral: 12.6 LV IVS:        0.80 cm      LV e' medial:    5.87 cm/s LVOT diam:     1.60 cm      LV E/e' medial:  14.7 LV SV:         49 LV SV Index:   32 LVOT Area:     2.01 cm  LV Volumes (MOD) LV vol d, MOD A2C: 116.0 ml LV vol d, MOD A4C: 89.3 ml LV vol s, MOD A2C: 36.4 ml LV vol s, MOD A4C: 37.9 ml LV SV MOD A2C:     79.6 ml LV SV MOD A4C:     89.3 ml LV SV MOD BP:      67.3 ml RIGHT VENTRICLE RV S prime:     12.80 cm/s TAPSE (M-mode): 1.9 cm LEFT ATRIUM             Index       RIGHT ATRIUM           Index LA  diam:        3.50 cm 2.28 cm/m  RA Area:     14.90 cm LA Vol (A2C):   35.9 ml 23.35 ml/m RA Volume:   38.20 ml  24.85 ml/m LA Vol (A4C):   39.4 ml 25.63 ml/m LA Biplane Vol: 38.6 ml 25.11 ml/m  AORTIC VALVE LVOT Vmax:   110.00 cm/s LVOT Vmean:  76.400 cm/s LVOT VTI:    0.242 m  AORTA Ao Root diam: 3.30 cm MITRAL VALVE               TRICUSPID VALVE MV Area (PHT): 4.21 cm    TR Peak grad:   25.8 mmHg MV Decel Time: 180 msec    TR Vmax:        254.00 cm/s MR Peak grad: 98.4 mmHg MR Vmax:      496.00 cm/s  SHUNTS MV E velocity: 86.50 cm/s  Systemic VTI:  0.24 m MV A velocity: 98.10 cm/s  Systemic Diam: 1.60 cm MV E/A ratio:  0.88 Mertie Moores MD Electronically signed by Mertie Moores MD Signature Date/Time: 06/05/2019/11:06:23 AM    Final       LAB RESULTS: Basic Metabolic Panel: Recent Labs  Lab 06/05/19 0609 06/05/19 0609 06/06/19 0430 06/07/19 0333  NA 131*   < > 130* 131*  K 4.1   < > 4.2 4.6  CL 101   < > 106 104  CO2 20*   < > 18* 20*  GLUCOSE 125*   < > 125* 99  BUN 28*   < > 22* 23*  CREATININE 1.53*   < > 1.09* 1.20*  CALCIUM 8.6*   < > 8.5* 8.6*  MG 1.9  --   --   --   PHOS 3.8  --   --   --    < > = values in this interval not displayed.   Liver Function Tests: Recent Labs  Lab 06/06/19 0430 06/07/19 0333  AST 47* 56*  ALT 27 29  ALKPHOS 99 89  BILITOT 1.2 0.8  PROT 6.2* 6.5  ALBUMIN 2.7* 2.8*   No results for input(s): LIPASE, AMYLASE in the last 168 hours. Recent Labs  Lab 06/05/19 0609 06/06/19 0430  AMMONIA 35 65*   CBC: Recent Labs  Lab 06/05/19 0540 06/05/19 0540 06/06/19 0430 06/06/19 0430 06/07/19 0333  WBC 4.4   < > 4.6  --  4.2  NEUTROABS 3.3  --   --   --   --   HGB 9.6*   < > 8.4*  --  9.1*  HCT 28.9*   < > 25.9*  --  26.9*  MCV 98.6   < > 100.8*   < > 100.0  PLT 84*   < > 66*  --  74*   < > = values in this interval not displayed.   Cardiac Enzymes: Recent Labs  Lab 06/04/19 2017  CKTOTAL 138   BNP: Invalid input(s):  POCBNP CBG: No results for input(s): GLUCAP in the last 168 hours.     Disposition and Follow-up: Discharge Instructions    Ambulatory referral to Oncology   Complete by: As directed    History of throat cancer status post tracheostomy.  Patient originally from [place], moved to [place], in past 1 year.   Diet - low sodium heart healthy   Complete by: As directed    Increase activity slowly   Complete by: As directed        DISPOSITION: SNF   DISCHARGE FOLLOW-UP  Contact information for follow-up providers    Gastroenterology, [facility]. Schedule an appointment as soon as possible for a visit in 2 week(s).   Why: Please call to make a follow-up appointment within 2 weeks Contact information: [address] [place] [address] [phone]            Contact information for after-discharge care    [facility] .   Service: Skilled Nursing Contact information: [address] [place] [phone]                   Time coordinating discharge:   35 minutes  Signed:   Estill Cotta M.D. [facility] 06/07/2019, 1:24 PM

## 2019-06-08 ENCOUNTER — Telehealth: Payer: Self-pay | Admitting: General Practice

## 2019-06-08 ENCOUNTER — Other Ambulatory Visit: Payer: Self-pay | Admitting: Nurse Practitioner

## 2019-06-08 DIAGNOSIS — B182 Chronic viral hepatitis C: Secondary | ICD-10-CM | POA: Diagnosis not present

## 2019-06-08 DIAGNOSIS — J449 Chronic obstructive pulmonary disease, unspecified: Secondary | ICD-10-CM | POA: Diagnosis not present

## 2019-06-08 DIAGNOSIS — I10 Essential (primary) hypertension: Secondary | ICD-10-CM | POA: Diagnosis not present

## 2019-06-08 DIAGNOSIS — C140 Malignant neoplasm of pharynx, unspecified: Secondary | ICD-10-CM | POA: Diagnosis not present

## 2019-06-08 NOTE — Telephone Encounter (Signed)
Pts sister called wanting to know if the medicine Rifaximin 550mg  was sent to the pharmacy

## 2019-06-09 ENCOUNTER — Other Ambulatory Visit: Payer: Self-pay | Admitting: Nurse Practitioner

## 2019-06-09 ENCOUNTER — Telehealth: Payer: Self-pay | Admitting: Hematology and Oncology

## 2019-06-09 DIAGNOSIS — I10 Essential (primary) hypertension: Secondary | ICD-10-CM | POA: Diagnosis not present

## 2019-06-09 DIAGNOSIS — B182 Chronic viral hepatitis C: Secondary | ICD-10-CM | POA: Diagnosis not present

## 2019-06-09 DIAGNOSIS — J449 Chronic obstructive pulmonary disease, unspecified: Secondary | ICD-10-CM | POA: Diagnosis not present

## 2019-06-09 DIAGNOSIS — C140 Malignant neoplasm of pharynx, unspecified: Secondary | ICD-10-CM | POA: Diagnosis not present

## 2019-06-09 MED ORDER — RIFAXIMIN 550 MG PO TABS: 550.0000 mg | ORAL_TABLET | Freq: Two times a day (BID) | ORAL | 0 refills | Status: DC

## 2019-06-09 NOTE — Telephone Encounter (Signed)
Call and spoke with sister [name] and confirmed 7/6 appt. Made aware to arrive 30 mins before and to bring photo ID and insurance cards

## 2019-06-09 NOTE — Telephone Encounter (Signed)
Can you please contact the patient and see why they are requesting a refill Thanks

## 2019-06-09 NOTE — Telephone Encounter (Signed)
Okay thanks refill sent

## 2019-06-09 NOTE — Telephone Encounter (Signed)
Pt sister stated --since the pt have refill with antibiotic, but do not know how long the pt needed to take it.. pPease advise

## 2019-06-12 DIAGNOSIS — I10 Essential (primary) hypertension: Secondary | ICD-10-CM | POA: Diagnosis not present

## 2019-06-12 DIAGNOSIS — D649 Anemia, unspecified: Secondary | ICD-10-CM | POA: Diagnosis not present

## 2019-06-13 DIAGNOSIS — K219 Gastro-esophageal reflux disease without esophagitis: Secondary | ICD-10-CM | POA: Diagnosis not present

## 2019-06-13 DIAGNOSIS — Z720 Tobacco use: Secondary | ICD-10-CM | POA: Diagnosis not present

## 2019-06-13 DIAGNOSIS — M6281 Muscle weakness (generalized): Secondary | ICD-10-CM | POA: Diagnosis not present

## 2019-06-13 DIAGNOSIS — C159 Malignant neoplasm of esophagus, unspecified: Secondary | ICD-10-CM | POA: Diagnosis not present

## 2019-06-13 DIAGNOSIS — D696 Thrombocytopenia, unspecified: Secondary | ICD-10-CM | POA: Diagnosis not present

## 2019-06-13 DIAGNOSIS — J449 Chronic obstructive pulmonary disease, unspecified: Secondary | ICD-10-CM | POA: Diagnosis not present

## 2019-06-13 DIAGNOSIS — F339 Major depressive disorder, recurrent, unspecified: Secondary | ICD-10-CM | POA: Diagnosis not present

## 2019-06-13 DIAGNOSIS — R9431 Abnormal electrocardiogram [ECG] [EKG]: Secondary | ICD-10-CM | POA: Diagnosis not present

## 2019-06-13 DIAGNOSIS — R1311 Dysphagia, oral phase: Secondary | ICD-10-CM | POA: Diagnosis not present

## 2019-06-13 DIAGNOSIS — N179 Acute kidney failure, unspecified: Secondary | ICD-10-CM | POA: Diagnosis not present

## 2019-06-13 DIAGNOSIS — R41841 Cognitive communication deficit: Secondary | ICD-10-CM | POA: Diagnosis not present

## 2019-06-13 DIAGNOSIS — K7031 Alcoholic cirrhosis of liver with ascites: Secondary | ICD-10-CM | POA: Diagnosis not present

## 2019-06-13 DIAGNOSIS — F0390 Unspecified dementia without behavioral disturbance: Secondary | ICD-10-CM | POA: Diagnosis not present

## 2019-06-13 DIAGNOSIS — G4700 Insomnia, unspecified: Secondary | ICD-10-CM | POA: Diagnosis not present

## 2019-06-13 DIAGNOSIS — R262 Difficulty in walking, not elsewhere classified: Secondary | ICD-10-CM | POA: Diagnosis not present

## 2019-06-13 DIAGNOSIS — K7460 Unspecified cirrhosis of liver: Secondary | ICD-10-CM | POA: Diagnosis not present

## 2019-06-13 DIAGNOSIS — B182 Chronic viral hepatitis C: Secondary | ICD-10-CM | POA: Diagnosis not present

## 2019-06-13 DIAGNOSIS — N183 Chronic kidney disease, stage 3 unspecified: Secondary | ICD-10-CM | POA: Diagnosis not present

## 2019-06-13 DIAGNOSIS — K7290 Hepatic failure, unspecified without coma: Secondary | ICD-10-CM | POA: Diagnosis not present

## 2019-06-17 DIAGNOSIS — K7031 Alcoholic cirrhosis of liver with ascites: Secondary | ICD-10-CM | POA: Diagnosis not present

## 2019-06-22 DIAGNOSIS — G2581 Restless legs syndrome: Secondary | ICD-10-CM | POA: Diagnosis not present

## 2019-06-22 DIAGNOSIS — F339 Major depressive disorder, recurrent, unspecified: Secondary | ICD-10-CM | POA: Diagnosis not present

## 2019-06-22 DIAGNOSIS — G4700 Insomnia, unspecified: Secondary | ICD-10-CM | POA: Diagnosis not present

## 2019-06-26 DIAGNOSIS — I10 Essential (primary) hypertension: Secondary | ICD-10-CM | POA: Diagnosis not present

## 2019-06-26 DIAGNOSIS — E7089 Other disorders of aromatic amino-acid metabolism: Secondary | ICD-10-CM | POA: Diagnosis not present

## 2019-06-26 DIAGNOSIS — E785 Hyperlipidemia, unspecified: Secondary | ICD-10-CM | POA: Diagnosis not present

## 2019-06-26 DIAGNOSIS — D649 Anemia, unspecified: Secondary | ICD-10-CM | POA: Diagnosis not present

## 2019-06-27 ENCOUNTER — Telehealth: Payer: Self-pay

## 2019-06-27 NOTE — Telephone Encounter (Addendum)
[facility] Patient Advocate Encounter  I called the patient's sister back and gave her the phone number to the pharmacy listed as the provider of [place].  [facility] [phone].  She stated she will call them to refill.  I let her know to contact me if there are problems.  [name]. Nadara Mustard [facility] Phone: [phone] Fax:  [phone]

## 2019-06-27 NOTE — Telephone Encounter (Signed)
Patient's sister called office on behalf of patient to notify office that she is almost out of her [place]. Patient has recently been moved to a rehab facility and will need refills. Requested call back to coordinate medication refills. Forwarding to pharmacy to coordinate.  Mckyla Deckman Lorita Officer, RN

## 2019-06-30 ENCOUNTER — Telehealth: Payer: Self-pay | Admitting: Hematology and Oncology

## 2019-06-30 NOTE — Telephone Encounter (Signed)
Received a call from the pt's sister to reschedule her appt w/Dr. Alvy Bimler to 7/16 at [facility] to arrive 15 minutes early.

## 2019-07-03 ENCOUNTER — Ambulatory Visit: Payer: Self-pay | Admitting: Internal Medicine

## 2019-07-06 ENCOUNTER — Ambulatory Visit (INDEPENDENT_AMBULATORY_CARE_PROVIDER_SITE_OTHER): Payer: Medicaid Other | Admitting: Pharmacist

## 2019-07-06 ENCOUNTER — Other Ambulatory Visit: Payer: Self-pay

## 2019-07-06 DIAGNOSIS — Z79899 Other long term (current) drug therapy: Secondary | ICD-10-CM | POA: Diagnosis not present

## 2019-07-06 DIAGNOSIS — B182 Chronic viral hepatitis C: Secondary | ICD-10-CM | POA: Diagnosis not present

## 2019-07-06 NOTE — Progress Notes (Addendum)
HPI: Judy Meza is a 59 y.o. female who presents to the [facility] for Hepatitis C follow-up.  Medication: Epclusa x 24 weeks  Start Date: 06/02/19  Hepatitis C Genotype: 1a  Fibrosis Score: F4 - decompensated  Hepatitis C RNA: 2,870 on 04/30/19  Patient Active Problem List   Diagnosis Date Noted  . Acute encephalopathy 06/05/2019  . Chest pain 06/04/2019  . QT prolongation 06/04/2019  . Metabolic acidosis 61/95/0932  . Dementia without behavioral disturbance ([place]) 05/10/2019  . Chronic hepatitis C without hepatic coma ([place]) 05/10/2019  . Hepatic encephalopathy ([place]) 05/09/2019  . Pancytopenia ([place])   . Cirrhosis of liver ([place])   . CKD (chronic kidney disease), stage III 04/29/2019  . COPD with chronic bronchitis ([place]) 04/29/2019  . Tobacco abuse 04/29/2019  . Alcoholism in remission ([place]) 04/29/2019  . Acute hepatic encephalopathy 04/29/2019  . AKI (acute kidney injury) ([place]) 04/29/2019  . Alcoholic cirrhosis of liver with ascites ([place]) 04/29/2019  . Thrombocytopenia ([place]) 04/29/2019  . Hyperkalemia 04/29/2019    Patient's Medications  New Prescriptions   No medications on file  Previous Medications   ALBUTEROL (PROVENTIL) (2.5 MG/3ML) 0.083% NEBULIZER SOLUTION    Take 3 mLs (2.5 mg total) by nebulization every 6 (six) hours as needed for wheezing or shortness of breath.   ALBUTEROL (VENTOLIN HFA) 108 (90 BASE) MCG/ACT INHALER    Inhale 2 puffs into the lungs every 6 (six) hours as needed for wheezing or shortness of breath.   FEEDING SUPPLEMENT, ENSURE ENLIVE, (ENSURE ENLIVE) LIQD    Take 237 mLs by mouth 2 (two) times daily between meals.   HYDROXYPROPYL METHYLCELLULOSE / HYPROMELLOSE (ISOPTO TEARS / GONIOVISC) 2.5 % OPHTHALMIC SOLUTION    Place 1 drop into both eyes 4 (four) times daily as needed for dry eyes.   HYDROXYZINE (ATARAX/VISTARIL) 10 MG TABLET    Take 1 tablet (10 mg total) by mouth 3 (three) times daily as needed for itching or anxiety.    LACTULOSE (CHRONULAC) 10 GM/15ML SOLUTION    Take 30 mLs (20 g total) by mouth 3 (three) times daily.   LORATADINE (CLARITIN) 10 MG TABLET    Take 1 tablet (10 mg total) by mouth daily.   MEMANTINE (NAMENDA) 5 MG TABLET    Take 1 tablet (5 mg total) by mouth 2 (two) times daily.   MULTIPLE VITAMIN (MULTIVITAMIN WITH MINERALS) TABS TABLET    Take 1 tablet by mouth daily.   NICOTINE (NICODERM CQ - DOSED IN MG/24 HOURS) 21 MG/24HR PATCH    Place 1 patch (21 mg total) onto the skin daily.   PANTOPRAZOLE (PROTONIX) 40 MG TABLET    Take 40 mg by mouth daily.   PAROXETINE (PAXIL) 40 MG TABLET    Take 1 tablet (40 mg total) by mouth every morning.   RIFAXIMIN (XIFAXAN) 550 MG TABS TABLET    Take 1 tablet (550 mg total) by mouth 2 (two) times daily.   SOFOSBUVIR-VELPATASVIR (EPCLUSA) 400-100 MG TABS    Take 1 tablet by mouth daily.   SPIRONOLACTONE (ALDACTONE) 100 MG TABLET    Take 0.5 tablets (50 mg total) by mouth daily.   THIAMINE 100 MG TABLET    Take 1 tablet (100 mg total) by mouth daily.   ZOLPIDEM (AMBIEN) 5 MG TABLET    Take 0.5 tablets (2.5 mg total) by mouth at bedtime as needed for sleep.  Modified Medications   No medications on file  Discontinued Medications   No medications on  file    Allergies: No Known Allergies  Past Medical History: Past Medical History:  Diagnosis Date  . Cancer ([place])   . COPD (chronic obstructive pulmonary disease) ([place])   . Hypertension   . Liver disease   . Renal disorder     Social History: Social History   Socioeconomic History  . Marital status: Single    Spouse name: Not on file  . Number of children: Not on file  . Years of education: Not on file  . Highest education level: Not on file  Occupational History  . Not on file  Tobacco Use  . Smoking status: Current Every Day Smoker    Packs/day: 0.25    Types: Cigarettes  . Smokeless tobacco: Never Used  . Tobacco comment: Willing to work on smoking cessation  Vaping Use  . Vaping Use:  Never used  Substance and Sexual Activity  . Alcohol use: Not Currently  . Drug use: Not Currently  . Sexual activity: Not Currently  Other Topics Concern  . Not on file  Social History Narrative  . Not on file   Social Determinants of Health   Financial Resource Strain:   . Difficulty of Paying Living Expenses:   Food Insecurity:   . Worried About Charity fundraiser in the Last Year:   . Arboriculturist in the Last Year:   Transportation Needs:   . Film/video editor (Medical):   Marland Kitchen Lack of Transportation (Non-Medical):   Physical Activity:   . Days of Exercise per Week:   . Minutes of Exercise per Session:   Stress:   . Feeling of Stress :   Social Connections:   . Frequency of Communication with Friends and Family:   . Frequency of Social Gatherings with Friends and Family:   . Attends Religious Services:   . Active Member of Clubs or Organizations:   . Attends Archivist Meetings:   Marland Kitchen Marital Status:     Labs: Hepatitis C Lab Results  Component Value Date   HCVGENOTYPE 1a 05/22/2019   Hepatitis B Lab Results  Component Value Date   HEPBSAB NON-REACTIVE 05/22/2019   HEPBSAG NON REACTIVE 04/30/2019   Hepatitis A Lab Results  Component Value Date   HAV NON-REACTIVE 05/22/2019   HIV Lab Results  Component Value Date   HIV NON REACTIVE 04/30/2019   Lab Results  Component Value Date   CREATININE 1.20 (H) 06/07/2019   CREATININE 1.09 (H) 06/06/2019   CREATININE 1.53 (H) 06/05/2019   CREATININE 2.47 (H) 06/04/2019   CREATININE 1.26 (H) 05/11/2019   Lab Results  Component Value Date   AST 56 (H) 06/07/2019   AST 47 (H) 06/06/2019   AST 49 (H) 06/05/2019   ALT 29 06/07/2019   ALT 27 06/06/2019   ALT 29 06/05/2019   INR 1.5 (H) 06/04/2019   INR 1.2 05/09/2019   INR 1.1 04/29/2019    Assessment: Judy Meza comes in today with her caregiver for Hepatitis C follow up. She has advanced cirrhosis that is decompensated, so she was prescribed  24 weeks of Epclusa to take to treat her Hep C.  She just started month 2 of 6. No side effects that she notices and no missed doses so far. She takes it every morning at 8am.  She did just get new Medicaid insurance, so Inez Catalina is working on getting her medication approved through them for the remainder of her treatment.    She was recently in  rehab and had an ammonia level checked on the 14th that was 184. Her lactulose Rx was decreased from 30 mL TID to 15 mL TID.  After the high level, it was increased again to the original dose. She is asking to recheck today. She is in the process of establishing care here with internal medicine (appt on 7/22), oncology as she has throat cancer (appt on 7/16), and neurology (appt on 8/11). From there, she is trying to establish care with GI/liver care as well.   No issues today. I congratulated her on her perfect adherence so far and encouraged her to continue taking it daily with no missed doses.  I told her we would update her on the Medicaid status. She has an appt with Dr. Linus Salmons in August.  Plan: - Continue Epclusa x 24 weeks - Hep C RNA, CMET, CBC today - F/u with Dr. Linus Salmons on 8/2  Mariacristina Aday L. Yiannis Tulloch, PharmD, BCIDP, AAHIVP, CPP Clinical Pharmacist Practitioner Infectious Diseases [facility] 07/06/2019, 2:52 PM

## 2019-07-07 ENCOUNTER — Other Ambulatory Visit: Payer: Self-pay | Admitting: Nurse Practitioner

## 2019-07-07 ENCOUNTER — Telehealth: Payer: Self-pay | Admitting: Pharmacy Technician

## 2019-07-07 NOTE — Telephone Encounter (Signed)
[facility] Patient Advocate Encounter   Received notification from [employer] that prior authorization for Epclusa is required.   PA submitted on 07/07/2019 Key [id number] W Status is pending    [facility] will continue to follow.   [name]. Nadara Mustard [place] Patient [facility] for Infectious Disease Phone: [phone] Fax:  [phone]

## 2019-07-08 LAB — COMPREHENSIVE METABOLIC PANEL
AG Ratio: 0.9 (calc) — ABNORMAL LOW (ref 1.0–2.5)
ALT: 18 U/L (ref 6–29)
AST: 33 U/L (ref 10–35)
Albumin: 3.3 g/dL — ABNORMAL LOW (ref 3.6–5.1)
Alkaline phosphatase (APISO): 85 U/L (ref 37–153)
BUN/Creatinine Ratio: 11 (calc) (ref 6–22)
BUN: 16 mg/dL (ref 7–25)
CO2: 19 mmol/L — ABNORMAL LOW (ref 20–32)
Calcium: 9 mg/dL (ref 8.6–10.4)
Chloride: 106 mmol/L (ref 98–110)
Creat: 1.41 mg/dL — ABNORMAL HIGH (ref 0.50–1.05)
Globulin: 3.8 g/dL (calc) — ABNORMAL HIGH (ref 1.9–3.7)
Glucose, Bld: 132 mg/dL — ABNORMAL HIGH (ref 65–99)
Potassium: 4.8 mmol/L (ref 3.5–5.3)
Sodium: 134 mmol/L — ABNORMAL LOW (ref 135–146)
Total Bilirubin: 1.3 mg/dL — ABNORMAL HIGH (ref 0.2–1.2)
Total Protein: 7.1 g/dL (ref 6.1–8.1)

## 2019-07-08 LAB — CBC
HCT: 27.7 % — ABNORMAL LOW (ref 35.0–45.0)
Hemoglobin: 9.1 g/dL — ABNORMAL LOW (ref 11.7–15.5)
MCH: 32.3 pg (ref 27.0–33.0)
MCHC: 32.9 g/dL (ref 32.0–36.0)
MCV: 98.2 fL (ref 80.0–100.0)
MPV: 11.3 fL (ref 7.5–12.5)
Platelets: 65 10*3/uL — ABNORMAL LOW (ref 140–400)
RBC: 2.82 10*6/uL — ABNORMAL LOW (ref 3.80–5.10)
RDW: 14.6 % (ref 11.0–15.0)
WBC: 2.7 10*3/uL — ABNORMAL LOW (ref 3.8–10.8)

## 2019-07-08 LAB — AMMONIA: Ammonia: 95 umol/L — ABNORMAL HIGH (ref <=72)

## 2019-07-08 LAB — HEPATITIS C RNA QUANTITATIVE
HCV Quantitative Log: <1.18 Log IU/mL
HCV RNA, PCR, QN: <15 IU/mL

## 2019-07-12 ENCOUNTER — Telehealth: Payer: Self-pay | Admitting: Pharmacist

## 2019-07-12 ENCOUNTER — Telehealth: Payer: Self-pay | Admitting: Pharmacy Technician

## 2019-07-12 ENCOUNTER — Other Ambulatory Visit: Payer: Self-pay | Admitting: Pharmacist

## 2019-07-12 DIAGNOSIS — B182 Chronic viral hepatitis C: Secondary | ICD-10-CM

## 2019-07-12 MED ORDER — SOFOSBUVIR-VELPATASVIR 400-100 MG PO TABS: 1.0000 | ORAL_TABLET | Freq: Every day | ORAL | 3 refills | Status: DC

## 2019-07-12 NOTE — Telephone Encounter (Signed)
[facility] Patient Advocate Encounter  Prior Authorization for Judy Meza has been approved.    PA# [id number] W Effective dates: 07/12/2019 through 09/10/2019  Patients co-pay is $3.  She has had 8 weeks filled to date through patient assistance.  Since [place] Medicaid is now active, she will need another 16 weeks.  [facility] will continue to follow.  [name]. Nadara Mustard [place] Patient [facility] Judy Meza for Infectious Disease Phone: [phone] Fax:  [phone]

## 2019-07-12 NOTE — Telephone Encounter (Signed)
Appeal worked and patient is now approved through [employer]. Judy Meza will coordinate.

## 2019-07-12 NOTE — Telephone Encounter (Signed)
Submitted appeal to Medicaid for patient's Epclusa treatment. She has already taken 1.5 months of treatment and recently got approval for Medicaid. Her initial prior authorization was denied, so an appeal was sent. Will continue to follow.

## 2019-07-12 NOTE — Progress Notes (Signed)
Patient is approved through [facility] for Epclusa treatment.  She has already received 2 months from patient assistance and will only need 4 more months from [facility].

## 2019-07-12 NOTE — Telephone Encounter (Signed)
Prior authorization approved.  She needs 24 weeks total.  She has filled the first 8 weeks through patient assistance and will need an additional 16 through [facility] Medicaid.  We will continue to follow.  [name]. Nadara Mustard [place] Patient [facility] for Infectious Disease Phone: [phone] Fax:  [phone]

## 2019-07-13 DIAGNOSIS — K7290 Hepatic failure, unspecified without coma: Secondary | ICD-10-CM | POA: Diagnosis not present

## 2019-07-13 DIAGNOSIS — K7031 Alcoholic cirrhosis of liver with ascites: Secondary | ICD-10-CM | POA: Diagnosis not present

## 2019-07-18 ENCOUNTER — Ambulatory Visit: Payer: Medicaid Other | Admitting: Hematology and Oncology

## 2019-07-18 ENCOUNTER — Telehealth: Payer: Self-pay

## 2019-07-18 DIAGNOSIS — K7031 Alcoholic cirrhosis of liver with ascites: Secondary | ICD-10-CM | POA: Diagnosis not present

## 2019-07-18 NOTE — Telephone Encounter (Signed)
Copied from [place] [id number]. Topic: Appointment Scheduling - Scheduling Inquiry for Clinic >> Jul 12, 2019  4:10 PM Erick Blinks wrote: Reason for CRM: Wells Guiles calling from [facility] called requesting authorization for visits for Medicaid. Please advise  Best contact: [phone]

## 2019-07-21 ENCOUNTER — Other Ambulatory Visit: Payer: Self-pay | Admitting: Nurse Practitioner

## 2019-07-21 ENCOUNTER — Telehealth: Payer: Self-pay | Admitting: Nurse Practitioner

## 2019-07-21 DIAGNOSIS — N183 Chronic kidney disease, stage 3 unspecified: Secondary | ICD-10-CM

## 2019-07-21 NOTE — Telephone Encounter (Signed)
Patient sister has been notified.

## 2019-07-21 NOTE — Telephone Encounter (Signed)
Hi  Please make pt and sister aware that referral has been placed. Thanks '

## 2019-07-26 MED FILL — EPCLUSA 400 MG-100 MG TAB: 400-100 | 28 days supply | Qty: 28 | Fill #0

## 2019-07-28 ENCOUNTER — Inpatient Hospital Stay: Payer: Medicaid Other | Attending: Hematology and Oncology | Admitting: Hematology and Oncology

## 2019-07-28 ENCOUNTER — Encounter: Payer: Self-pay | Admitting: Hematology and Oncology

## 2019-07-28 ENCOUNTER — Other Ambulatory Visit: Payer: Self-pay

## 2019-07-28 DIAGNOSIS — Z7189 Other specified counseling: Secondary | ICD-10-CM

## 2019-07-28 DIAGNOSIS — F0390 Unspecified dementia without behavioral disturbance: Secondary | ICD-10-CM

## 2019-07-28 DIAGNOSIS — B1920 Unspecified viral hepatitis C without hepatic coma: Secondary | ICD-10-CM | POA: Diagnosis not present

## 2019-07-28 DIAGNOSIS — Z8521 Personal history of malignant neoplasm of larynx: Secondary | ICD-10-CM

## 2019-07-28 DIAGNOSIS — F17210 Nicotine dependence, cigarettes, uncomplicated: Secondary | ICD-10-CM

## 2019-07-28 DIAGNOSIS — B182 Chronic viral hepatitis C: Secondary | ICD-10-CM

## 2019-07-28 DIAGNOSIS — Z720 Tobacco use: Secondary | ICD-10-CM

## 2019-07-28 DIAGNOSIS — D61818 Other pancytopenia: Secondary | ICD-10-CM | POA: Diagnosis not present

## 2019-07-28 DIAGNOSIS — N183 Chronic kidney disease, stage 3 unspecified: Secondary | ICD-10-CM | POA: Diagnosis not present

## 2019-07-28 HISTORY — DX: Personal history of malignant neoplasm of larynx: Z85.21

## 2019-07-28 NOTE — Assessment & Plan Note (Signed)
The patient is frail with significant health issues Her sister is her dedicated healthcare power of attorney We discussed goals of care Her sister just want her to be comfortable, not necessarily aggressive management to prolong life She is in agreement not to have future follow-up all further tests

## 2019-07-28 NOTE — Assessment & Plan Note (Addendum)
I do not know the stage of her disease No outside records are available She is in remission according to her sister She had recent ENT visit The patient has significant other health medical issues She is not well enough to warrant further treatment At this point in time, I do not recommend surveillance imaging study or future follow-up

## 2019-07-28 NOTE — Assessment & Plan Note (Signed)
She continues to smoke on a regular basis The patient is not willing to quit, according to her sister

## 2019-07-28 NOTE — Assessment & Plan Note (Signed)
This is multifactorial The cause of her leukopenia and thrombocytopenia are likely due to hepatitis C Her anemia is likely anemia chronic kidney disease She does not need further blood draw

## 2019-07-28 NOTE — Assessment & Plan Note (Signed)
I am not able to gauge her mental status She is on medication for cognitive impairment/dementia She also have recurrent history of falls Today, there is no apparent neurological deficit I recommend her sister to bring her to the emergency department if she have another fall for further evaluation

## 2019-07-28 NOTE — Assessment & Plan Note (Signed)
The patient has elevated ammonia level with intermittent altered mental status She is on lactulose She is on antiviral therapy for hepatitis C

## 2019-07-28 NOTE — Progress Notes (Signed)
Judy Meza CONSULT NOTE  Patient Care Team: Judy Francois, NP as PCP - General (Adult Health Nurse Practitioner)  ASSESSMENT & PLAN:  History of laryngeal cancer I do not know the stage of her disease No outside records are available She is in remission according to her sister She had recent ENT visit The patient has significant other health medical issues She is not well enough to warrant further treatment At this point in time, I do not recommend surveillance imaging study or future follow-up   Pancytopenia, acquired [facility]) This is multifactorial The cause of her leukopenia and thrombocytopenia are likely due to hepatitis C Her anemia is likely anemia chronic kidney disease She does not need further blood draw  Chronic hepatitis C without hepatic coma ([place]) The patient has elevated ammonia level with intermittent altered mental status She is on lactulose She is on antiviral therapy for hepatitis C  CKD (chronic kidney disease), stage III She has intermittent acute on chronic renal failure Her oral intake is poor She is on multiple diuretic therapy She has appointment to follow-up with nephrologist   Dementia without behavioral disturbance ([place]) I am not able to gauge her mental status She is on medication for cognitive impairment/dementia She also have recurrent history of falls Today, there is no apparent neurological deficit I recommend her sister to bring her to the emergency department if she have another fall for further evaluation  Tobacco abuse She continues to smoke on a regular basis The patient is not willing to quit, according to her sister  Goals of care, counseling/discussion The patient is frail with significant health issues Her sister is her dedicated healthcare power of attorney We discussed goals of care Her sister just want her to be comfortable, not necessarily aggressive management to prolong life She is in agreement not to  have future follow-up all further tests   No orders of the defined types were placed in this encounter.   The total time spent in the appointment was 55 minutes encounter with patients including review of chart and various tests results, discussions about plan of care and coordination of care plan   All questions were answered. The patient knows to call the clinic with any problems, questions or concerns. No barriers to learning was detected.  Judy Lark, MD 7/16/20218:42 AM  CHIEF COMPLAINTS/PURPOSE OF CONSULTATION:  History of laryngeal cancer  HISTORY OF PRESENTING ILLNESS:  Judy Meza 59 y.o. female is here because of history of laryngeal cancer She originated from [place] and relocated to [place] many months ago due to family support locally Her sister, Judy Meza is present She has a son who is not involved in her care I do not have any electronic records pertaining to her cancer diagnosis According to her sister, the patient is in remission She had a laryngectomy and her trach was removed not long ago She saw Dr. Constance Meza recently She have significant history of smoking and alcoholism prior to the diagnosis She continues to smoke The patient lives alone She cannot speak or write She is also noted to have cognitive impairment/dementia and is taking medication  She had recurrent hospitalization.  She was subsequently discharged to a rehab facility after recent hospitalization but the patient left [place] Her sister called her on a regular basis and check on her She found her on the floor multiple times Her sister is noncompliant taking medications or eating She brings her to all doctors appointment She is frustrated because the  patient is not cooperative in taking medications, following instructions or eating  MEDICAL HISTORY:  Past Medical History:  Diagnosis Date  . Cancer ([place])   . COPD (chronic obstructive pulmonary disease) ([place])   . Hypertension   .  Liver disease   . Renal disorder     SURGICAL HISTORY: Past Surgical History:  Procedure Laterality Date  . PORT-A-CATH REMOVAL      SOCIAL HISTORY: Social History   Socioeconomic History  . Marital status: Single    Spouse name: Not on file  . Number of children: Not on file  . Years of education: Not on file  . Highest education level: Not on file  Occupational History  . Not on file  Tobacco Use  . Smoking status: Current Every Day Smoker    Packs/day: 0.25    Types: Cigarettes  . Smokeless tobacco: Never Used  . Tobacco comment: Willing to work on smoking cessation  Vaping Use  . Vaping Use: Never used  Substance and Sexual Activity  . Alcohol use: Not Currently  . Drug use: Not Currently  . Sexual activity: Not Currently  Other Topics Concern  . Not on file  Social History Narrative  . Not on file   Social Determinants of Health   Financial Resource Strain:   . Difficulty of Paying Living Expenses:   Food Insecurity:   . Worried About Charity fundraiser in the Last Year:   . Arboriculturist in the Last Year:   Transportation Needs:   . Film/video editor (Medical):   Marland Kitchen Lack of Transportation (Non-Medical):   Physical Activity:   . Days of Exercise per Week:   . Minutes of Exercise per Session:   Stress:   . Feeling of Stress :   Social Connections:   . Frequency of Communication with Friends and Family:   . Frequency of Social Gatherings with Friends and Family:   . Attends Religious Services:   . Active Member of Clubs or Organizations:   . Attends Archivist Meetings:   Marland Kitchen Marital Status:   Intimate Partner Violence:   . Fear of Current or Ex-Partner:   . Emotionally Abused:   Marland Kitchen Physically Abused:   . Sexually Abused:     FAMILY HISTORY: Family History  Problem Relation Age of Onset  . Healthy Mother   . Hypertension Other     ALLERGIES:  has No Known Allergies.  MEDICATIONS:  Current Outpatient Medications  Medication  Sig Dispense Refill  . cholestyramine (QUESTRAN) 4 g packet Take 4 g by mouth daily.    . furosemide (LASIX) 20 MG tablet Take 20 mg by mouth.    . gabapentin (NEURONTIN) 100 MG capsule Take 200 mg by mouth 3 (three) times daily.    Marland Kitchen LORazepam (ATIVAN) 0.5 MG tablet Take 0.5 mg by mouth 3 (three) times daily as needed.    Marland Kitchen albuterol (PROVENTIL) (2.5 MG/3ML) 0.083% nebulizer solution Take 3 mLs (2.5 mg total) by nebulization every 6 (six) hours as needed for wheezing or shortness of breath. 150 mL 1  . albuterol (VENTOLIN HFA) 108 (90 Base) MCG/ACT inhaler Inhale 2 puffs into the lungs every 6 (six) hours as needed for wheezing or shortness of breath. 8 g 1  . feeding supplement, ENSURE ENLIVE, (ENSURE ENLIVE) LIQD Take 237 mLs by mouth 2 (two) times daily between meals. 237 mL 12  . hydrOXYzine (ATARAX/VISTARIL) 10 MG tablet Take 1 tablet (10 mg total) by mouth  3 (three) times daily as needed for itching or anxiety. 30 tablet 0  . lactulose (CHRONULAC) 10 GM/15ML solution Take 30 mLs (20 g total) by mouth 3 (three) times daily. 1892 mL 1  . loratadine (CLARITIN) 10 MG tablet Take 1 tablet (10 mg total) by mouth daily. 90 tablet 0  . memantine (NAMENDA) 5 MG tablet Take 1 tablet (5 mg total) by mouth 2 (two) times daily. (Patient taking differently: Take 10 mg by mouth 2 (two) times daily. ) 60 tablet 2  . Multiple Vitamin (MULTIVITAMIN WITH MINERALS) TABS tablet Take 1 tablet by mouth daily.    . pantoprazole (PROTONIX) 40 MG tablet Take 40 mg by mouth daily.    Marland Kitchen PARoxetine (PAXIL) 40 MG tablet Take 1 tablet (40 mg total) by mouth every morning. 30 tablet 2  . rifaximin (XIFAXAN) 550 MG TABS tablet Take 1 tablet (550 mg total) by mouth 2 (two) times daily. 42 tablet 0  . Sofosbuvir-Velpatasvir (EPCLUSA) 400-100 MG TABS Take 1 tablet by mouth daily. 28 tablet 3  . spironolactone (ALDACTONE) 100 MG tablet Take 0.5 tablets (50 mg total) by mouth daily. (Patient taking differently: Take 100 mg by  mouth daily. )    . thiamine 100 MG tablet Take 1 tablet (100 mg total) by mouth daily.    Marland Kitchen zolpidem (AMBIEN) 5 MG tablet Take 0.5 tablets (2.5 mg total) by mouth at bedtime as needed for sleep. 45 tablet 0   No current facility-administered medications for this visit.    REVIEW OF SYSTEMS: Unable to obtain  PHYSICAL EXAMINATION: ECOG PERFORMANCE STATUS: 2 - Symptomatic, <50% confined to bed  Vitals:   07/28/19 0812  BP: 119/79  Meza: 84  Resp: 18  Temp: 97.7 F (36.5 C)  SpO2: 100%   Filed Weights   07/28/19 0812  Weight: 127 lb 6.4 oz (57.8 kg)    GENERAL:alert, no distress and comfortable.  She looks thin and cachectic SKIN: skin color, texture, turgor are normal, no rashes or significant lesions EYES: normal, conjunctiva are pink and non-injected, sclera clear OROPHARYNX:no exudate, no erythema and lips, buccal mucosa, and tongue normal  NECK: supple, thyroid normal size, non-tender, without nodularity.  A bandage is noted over the trach site LYMPH:  no palpable lymphadenopathy in the cervical, axillary or inguinal LUNGS: clear to auscultation and percussion with normal breathing effort HEART: regular rate & rhythm and no murmurs and no lower extremity edema ABDOMEN:abdomen soft, non-tender and normal bowel sounds Musculoskeletal:no cyanosis of digits and no clubbing  PSYCH: Unable to obtain NEURO: no focal motor/sensory deficits.  She is able to walk with a walker  LABORATORY DATA:  I have reviewed the data as listed Lab Results  Component Value Date   WBC 2.7 (L) 07/06/2019   HGB 9.1 (L) 07/06/2019   HCT 27.7 (L) 07/06/2019   MCV 98.2 07/06/2019   PLT 65 (L) 07/06/2019   Recent Labs    06/05/19 0609 06/05/19 0609 06/06/19 0430 06/07/19 0333 07/06/19 1513  NA 131*   < > 130* 131* 134*  K 4.1   < > 4.2 4.6 4.8  CL 101   < > 106 104 106  CO2 20*   < > 18* 20* 19*  GLUCOSE 125*   < > 125* 99 132*  BUN 28*   < > 22* 23* 16  CREATININE 1.53*   < > 1.09*  1.20* 1.41*  CALCIUM 8.6*   < > 8.5* 8.6* 9.0  GFRNONAA 38*  --  57* 50*  --   GFRAA 44*  --  >60 58*  --   PROT 7.2   < > 6.2* 6.5 7.1  ALBUMIN 3.3*  --  2.7* 2.8*  --   AST 49*   < > 47* 56* 33  ALT 29   < > 27 29 18   ALKPHOS 94  --  99 89  --   BILITOT 2.3*   < > 1.2 0.8 1.3*   < > = values in this interval not displayed.

## 2019-07-28 NOTE — Assessment & Plan Note (Signed)
She has intermittent acute on chronic renal failure Her oral intake is poor She is on multiple diuretic therapy She has appointment to follow-up with nephrologist

## 2019-07-31 ENCOUNTER — Telehealth: Payer: Self-pay | Admitting: Hematology and Oncology

## 2019-07-31 NOTE — Telephone Encounter (Signed)
Per 7/16 los, no changes made to pt schedule

## 2019-08-03 ENCOUNTER — Emergency Department (HOSPITAL_COMMUNITY): Payer: Medicaid Other

## 2019-08-03 ENCOUNTER — Observation Stay (HOSPITAL_COMMUNITY): Payer: Medicaid Other

## 2019-08-03 ENCOUNTER — Encounter: Payer: Self-pay | Admitting: Nurse Practitioner

## 2019-08-03 ENCOUNTER — Ambulatory Visit (INDEPENDENT_AMBULATORY_CARE_PROVIDER_SITE_OTHER): Payer: Medicaid Other | Admitting: Nurse Practitioner

## 2019-08-03 ENCOUNTER — Other Ambulatory Visit: Payer: Self-pay

## 2019-08-03 ENCOUNTER — Encounter (HOSPITAL_COMMUNITY): Payer: Self-pay

## 2019-08-03 ENCOUNTER — Inpatient Hospital Stay (HOSPITAL_COMMUNITY)
Admission: EM | Admit: 2019-08-03 | Discharge: 2019-08-09 | DRG: 433 | Disposition: A | Discharge status: Home / Self Care | Payer: Medicaid Other | Attending: Internal Medicine | Admitting: Internal Medicine

## 2019-08-03 VITALS — BP 117/72 | HR 82 | Temp 97.7°F | Resp 17 | Ht 62.0 in | Wt 129.6 lb

## 2019-08-03 DIAGNOSIS — R600 Localized edema: Secondary | ICD-10-CM | POA: Diagnosis not present

## 2019-08-03 DIAGNOSIS — D61818 Other pancytopenia: Secondary | ICD-10-CM | POA: Diagnosis present

## 2019-08-03 DIAGNOSIS — Z8249 Family history of ischemic heart disease and other diseases of the circulatory system: Secondary | ICD-10-CM

## 2019-08-03 DIAGNOSIS — E872 Acidosis, unspecified: Secondary | ICD-10-CM | POA: Diagnosis present

## 2019-08-03 DIAGNOSIS — D696 Thrombocytopenia, unspecified: Secondary | ICD-10-CM | POA: Diagnosis present

## 2019-08-03 DIAGNOSIS — Z20822 Contact with and (suspected) exposure to covid-19: Secondary | ICD-10-CM | POA: Diagnosis present

## 2019-08-03 DIAGNOSIS — R627 Adult failure to thrive: Secondary | ICD-10-CM

## 2019-08-03 DIAGNOSIS — K7031 Alcoholic cirrhosis of liver with ascites: Secondary | ICD-10-CM | POA: Diagnosis present

## 2019-08-03 DIAGNOSIS — K219 Gastro-esophageal reflux disease without esophagitis: Secondary | ICD-10-CM | POA: Diagnosis present

## 2019-08-03 DIAGNOSIS — R188 Other ascites: Secondary | ICD-10-CM

## 2019-08-03 DIAGNOSIS — K7682 Hepatic encephalopathy: Secondary | ICD-10-CM | POA: Diagnosis present

## 2019-08-03 DIAGNOSIS — K7040 Alcoholic hepatic failure without coma: Principal | ICD-10-CM | POA: Diagnosis present

## 2019-08-03 DIAGNOSIS — Z930 Tracheostomy status: Secondary | ICD-10-CM

## 2019-08-03 DIAGNOSIS — Z79899 Other long term (current) drug therapy: Secondary | ICD-10-CM

## 2019-08-03 DIAGNOSIS — J449 Chronic obstructive pulmonary disease, unspecified: Secondary | ICD-10-CM | POA: Diagnosis present

## 2019-08-03 DIAGNOSIS — Z720 Tobacco use: Secondary | ICD-10-CM | POA: Diagnosis present

## 2019-08-03 DIAGNOSIS — F1011 Alcohol abuse, in remission: Secondary | ICD-10-CM | POA: Diagnosis present

## 2019-08-03 DIAGNOSIS — B182 Chronic viral hepatitis C: Secondary | ICD-10-CM | POA: Diagnosis present

## 2019-08-03 DIAGNOSIS — M545 Low back pain: Secondary | ICD-10-CM | POA: Diagnosis present

## 2019-08-03 DIAGNOSIS — G9340 Encephalopathy, unspecified: Secondary | ICD-10-CM

## 2019-08-03 DIAGNOSIS — G8929 Other chronic pain: Secondary | ICD-10-CM | POA: Diagnosis present

## 2019-08-03 DIAGNOSIS — G319 Degenerative disease of nervous system, unspecified: Secondary | ICD-10-CM | POA: Diagnosis present

## 2019-08-03 DIAGNOSIS — W19XXXA Unspecified fall, initial encounter: Secondary | ICD-10-CM

## 2019-08-03 DIAGNOSIS — I10 Essential (primary) hypertension: Secondary | ICD-10-CM | POA: Diagnosis not present

## 2019-08-03 DIAGNOSIS — Y92009 Unspecified place in unspecified non-institutional (private) residence as the place of occurrence of the external cause: Secondary | ICD-10-CM

## 2019-08-03 DIAGNOSIS — Z8521 Personal history of malignant neoplasm of larynx: Secondary | ICD-10-CM

## 2019-08-03 DIAGNOSIS — M5410 Radiculopathy, site unspecified: Secondary | ICD-10-CM

## 2019-08-03 DIAGNOSIS — M549 Dorsalgia, unspecified: Secondary | ICD-10-CM

## 2019-08-03 DIAGNOSIS — R5383 Other fatigue: Secondary | ICD-10-CM

## 2019-08-03 DIAGNOSIS — R296 Repeated falls: Secondary | ICD-10-CM

## 2019-08-03 DIAGNOSIS — K7200 Acute and subacute hepatic failure without coma: Secondary | ICD-10-CM | POA: Diagnosis not present

## 2019-08-03 DIAGNOSIS — R531 Weakness: Secondary | ICD-10-CM | POA: Diagnosis present

## 2019-08-03 DIAGNOSIS — K7460 Unspecified cirrhosis of liver: Secondary | ICD-10-CM | POA: Diagnosis present

## 2019-08-03 DIAGNOSIS — N184 Chronic kidney disease, stage 4 (severe): Secondary | ICD-10-CM | POA: Diagnosis present

## 2019-08-03 DIAGNOSIS — J4489 Other specified chronic obstructive pulmonary disease: Secondary | ICD-10-CM | POA: Diagnosis present

## 2019-08-03 DIAGNOSIS — Z66 Do not resuscitate: Secondary | ICD-10-CM | POA: Diagnosis present

## 2019-08-03 DIAGNOSIS — K7290 Hepatic failure, unspecified without coma: Secondary | ICD-10-CM | POA: Diagnosis not present

## 2019-08-03 DIAGNOSIS — T473X6A Underdosing of saline and osmotic laxatives, initial encounter: Secondary | ICD-10-CM | POA: Diagnosis present

## 2019-08-03 DIAGNOSIS — N1831 Chronic kidney disease, stage 3a: Secondary | ICD-10-CM | POA: Diagnosis present

## 2019-08-03 DIAGNOSIS — I129 Hypertensive chronic kidney disease with stage 1 through stage 4 chronic kidney disease, or unspecified chronic kidney disease: Secondary | ICD-10-CM | POA: Diagnosis present

## 2019-08-03 DIAGNOSIS — F0391 Unspecified dementia with behavioral disturbance: Secondary | ICD-10-CM

## 2019-08-03 DIAGNOSIS — M546 Pain in thoracic spine: Secondary | ICD-10-CM | POA: Diagnosis present

## 2019-08-03 DIAGNOSIS — Z923 Personal history of irradiation: Secondary | ICD-10-CM

## 2019-08-03 DIAGNOSIS — F0390 Unspecified dementia without behavioral disturbance: Secondary | ICD-10-CM | POA: Diagnosis present

## 2019-08-03 DIAGNOSIS — Z91138 Patient's unintentional underdosing of medication regimen for other reason: Secondary | ICD-10-CM

## 2019-08-03 DIAGNOSIS — F17210 Nicotine dependence, cigarettes, uncomplicated: Secondary | ICD-10-CM | POA: Diagnosis present

## 2019-08-03 LAB — URINALYSIS, ROUTINE W REFLEX MICROSCOPIC
Bilirubin Urine: NEGATIVE
Glucose, UA: NEGATIVE mg/dL
Hgb urine dipstick: NEGATIVE
Ketones, ur: NEGATIVE mg/dL
Leukocytes,Ua: NEGATIVE
Nitrite: NEGATIVE
Protein, ur: NEGATIVE mg/dL
Specific Gravity, Urine: 1.009 (ref 1.005–1.030)
pH: 8 (ref 5.0–8.0)

## 2019-08-03 LAB — RAPID URINE DRUG SCREEN, HOSP PERFORMED
Amphetamines: NOT DETECTED
Barbiturates: NOT DETECTED
Benzodiazepines: NOT DETECTED
Cocaine: NOT DETECTED
Opiates: NOT DETECTED
Tetrahydrocannabinol: NOT DETECTED

## 2019-08-03 LAB — COMPREHENSIVE METABOLIC PANEL
ALT: 38 U/L (ref 0–44)
AST: 56 U/L — ABNORMAL HIGH (ref 15–41)
Albumin: 3.3 g/dL — ABNORMAL LOW (ref 3.5–5.0)
Alkaline Phosphatase: 54 U/L (ref 38–126)
Anion gap: 9 (ref 5–15)
BUN: 15 mg/dL (ref 6–20)
CO2: 21 mmol/L — ABNORMAL LOW (ref 22–32)
Calcium: 9.7 mg/dL (ref 8.9–10.3)
Chloride: 106 mmol/L (ref 98–111)
Creatinine, Ser: 1.31 mg/dL — ABNORMAL HIGH (ref 0.44–1.00)
GFR calc Af Amer: 52 mL/min — ABNORMAL LOW (ref >60)
GFR calc non Af Amer: 45 mL/min — ABNORMAL LOW (ref >60)
Glucose, Bld: 93 mg/dL (ref 70–99)
Potassium: 4.6 mmol/L (ref 3.5–5.1)
Sodium: 136 mmol/L (ref 135–145)
Total Bilirubin: 1.9 mg/dL — ABNORMAL HIGH (ref 0.3–1.2)
Total Protein: 7.8 g/dL (ref 6.5–8.1)

## 2019-08-03 LAB — CBC WITH DIFFERENTIAL/PLATELET
Abs Immature Granulocytes: 0 10*3/uL (ref 0.00–0.07)
Basophils Absolute: 0 10*3/uL (ref 0.0–0.1)
Basophils Relative: 1 %
Eosinophils Absolute: 0 10*3/uL (ref 0.0–0.5)
Eosinophils Relative: 1 %
HCT: 31.5 % — ABNORMAL LOW (ref 36.0–46.0)
Hemoglobin: 10 g/dL — ABNORMAL LOW (ref 12.0–15.0)
Immature Granulocytes: 0 %
Lymphocytes Relative: 37 %
Lymphs Abs: 0.8 10*3/uL (ref 0.7–4.0)
MCH: 31.1 pg (ref 26.0–34.0)
MCHC: 31.7 g/dL (ref 30.0–36.0)
MCV: 97.8 fL (ref 80.0–100.0)
Monocytes Absolute: 0.3 10*3/uL (ref 0.1–1.0)
Monocytes Relative: 12 %
Neutro Abs: 1.1 10*3/uL — ABNORMAL LOW (ref 1.7–7.7)
Neutrophils Relative %: 49 %
Platelets: 63 10*3/uL — ABNORMAL LOW (ref 150–400)
RBC: 3.22 MIL/uL — ABNORMAL LOW (ref 3.87–5.11)
RDW: 16.1 % — ABNORMAL HIGH (ref 11.5–15.5)
WBC: 2.2 10*3/uL — ABNORMAL LOW (ref 4.0–10.5)
nRBC: 0 % (ref 0.0–0.2)

## 2019-08-03 LAB — AMMONIA: Ammonia: 72 umol/L — ABNORMAL HIGH (ref 9–35)

## 2019-08-03 LAB — SARS CORONAVIRUS 2 BY RT PCR (HOSPITAL ORDER, PERFORMED IN ~~LOC~~ HOSPITAL LAB): SARS Coronavirus 2: NEGATIVE

## 2019-08-03 LAB — LIPASE, BLOOD: Lipase: 75 U/L — ABNORMAL HIGH (ref 11–51)

## 2019-08-03 LAB — TSH: TSH: 3.803 u[IU]/mL (ref 0.350–4.500)

## 2019-08-03 MED ORDER — PAROXETINE HCL 20 MG PO TABS
40.0000 mg | ORAL_TABLET | ORAL | Status: DC
  Administered 2019-08-04: 40 mg via ORAL
  Filled 2019-08-03: qty 2

## 2019-08-03 MED ORDER — SOFOSBUVIR-VELPATASVIR 400-100 MG PO TABS
1.0000 | ORAL_TABLET | Freq: Every day | ORAL | Status: DC
  Administered 2019-08-05 – 2019-08-09 (×5): 1 via ORAL

## 2019-08-03 MED ORDER — THIAMINE HCL 100 MG PO TABS
100.0000 mg | ORAL_TABLET | Freq: Every day | ORAL | Status: DC
  Administered 2019-08-04 – 2019-08-09 (×6): 100 mg via ORAL
  Filled 2019-08-03 (×6): qty 1

## 2019-08-03 MED ORDER — ALBUTEROL SULFATE (2.5 MG/3ML) 0.083% IN NEBU: 2.5000 mg | INHALATION_SOLUTION | Freq: Four times a day (QID) | RESPIRATORY_TRACT | Status: DC | PRN

## 2019-08-03 MED ORDER — MEMANTINE HCL 10 MG PO TABS
5.0000 mg | ORAL_TABLET | Freq: Two times a day (BID) | ORAL | Status: DC
  Administered 2019-08-04 – 2019-08-09 (×12): 5 mg via ORAL
  Filled 2019-08-03 (×12): qty 1

## 2019-08-03 MED ORDER — GABAPENTIN 100 MG PO CAPS
200.0000 mg | ORAL_CAPSULE | Freq: Three times a day (TID) | ORAL | Status: DC
  Administered 2019-08-04 (×2): 200 mg via ORAL
  Filled 2019-08-03 (×2): qty 2

## 2019-08-03 MED ORDER — LACTULOSE 10 GM/15ML PO SOLN
20.0000 g | Freq: Three times a day (TID) | ORAL | Status: DC
  Administered 2019-08-04 – 2019-08-09 (×16): 20 g via ORAL
  Filled 2019-08-03 (×17): qty 30

## 2019-08-03 MED ORDER — LORATADINE 10 MG PO TABS
10.0000 mg | ORAL_TABLET | Freq: Every day | ORAL | Status: DC
  Administered 2019-08-04 – 2019-08-09 (×6): 10 mg via ORAL
  Filled 2019-08-03 (×6): qty 1

## 2019-08-03 MED ORDER — FUROSEMIDE 20 MG PO TABS
20.0000 mg | ORAL_TABLET | Freq: Every day | ORAL | Status: DC
  Administered 2019-08-04 – 2019-08-09 (×6): 20 mg via ORAL
  Filled 2019-08-03 (×6): qty 1

## 2019-08-03 MED ORDER — LACTULOSE 10 GM/15ML PO SOLN
10.0000 g | Freq: Once | ORAL | Status: AC
  Administered 2019-08-03: 10 g via ORAL
  Filled 2019-08-03: qty 30

## 2019-08-03 MED ORDER — LORAZEPAM 0.5 MG PO TABS
0.5000 mg | ORAL_TABLET | Freq: Every day | ORAL | Status: DC
  Administered 2019-08-04 – 2019-08-08 (×6): 0.5 mg via ORAL
  Filled 2019-08-03 (×6): qty 1

## 2019-08-03 MED ORDER — SPIRONOLACTONE 100 MG PO TABS
100.0000 mg | ORAL_TABLET | Freq: Every day | ORAL | Status: DC
  Administered 2019-08-04 – 2019-08-09 (×6): 100 mg via ORAL
  Filled 2019-08-03: qty 4
  Filled 2019-08-03 (×2): qty 1
  Filled 2019-08-03 (×2): qty 4
  Filled 2019-08-03 (×3): qty 1
  Filled 2019-08-03: qty 4
  Filled 2019-08-03: qty 1

## 2019-08-03 MED ORDER — PANTOPRAZOLE SODIUM 40 MG PO TBEC: 40.0000 mg | DELAYED_RELEASE_TABLET | Freq: Every day | ORAL | Status: DC

## 2019-08-03 MED ORDER — CHOLESTYRAMINE LIGHT 4 G PO PACK
4.0000 g | PACK | ORAL | Status: DC
  Administered 2019-08-04: 4 g via ORAL
  Filled 2019-08-03 (×3): qty 1

## 2019-08-03 MED ORDER — HYDROXYZINE HCL 10 MG PO TABS
10.0000 mg | ORAL_TABLET | Freq: Three times a day (TID) | ORAL | Status: DC | PRN
  Administered 2019-08-05 – 2019-08-09 (×12): 10 mg via ORAL
  Filled 2019-08-03 (×17): qty 1

## 2019-08-03 MED ORDER — RIFAXIMIN 550 MG PO TABS
550.0000 mg | ORAL_TABLET | Freq: Two times a day (BID) | ORAL | Status: DC
  Administered 2019-08-04 – 2019-08-09 (×11): 550 mg via ORAL
  Filled 2019-08-03 (×11): qty 1

## 2019-08-03 NOTE — Progress Notes (Signed)
[facility] [place], [place]  [address] Phone:  [phone]   Fax:  [phone]   Established Patient Office Visit  Subjective:  Patient ID: Judy Meza, female    DOB: 09-Feb-1960  Age: 59 y.o. MRN: [id number]  CC:  Chief Complaint  Patient presents with   Follow-up    Pt states she is here for f/u. Sister states she has some concerns. Falls x6 times in last 2wks. nose bleeds after she falls. blisters on both legs x1wk, twiching on L side of face and Lleg. Sister thinks some signs of dementia. Sister states pt lives by herself. Pt states her back also hurts and she can't follow the instrucstion on taken her medicine.Marland Kitchen    HPI Judy Meza presents for follow up. She  has a past medical history of Cancer ([place]), COPD (chronic obstructive pulmonary disease) ([place]), Hypertension, Liver disease, and Renal disorder.   Dementia She is here for evaluation and treatment of cognitive problems. She is accompanied by patient and sister. Primary caregiver is patient and sister. The family and the patient identify problems with unable to feed or care for herself. Family and patient report problems with alter level of consciousness and mental status. Family and patient are concerned about  medication errors, wandering, inability to maintain adequate nutrition and multple falls in and out of home, however, they are not concerned about driving. Medication administration: family sets up medications and however patint is unable to take time. She has been found on the floor naked on multiple occasions in the last 2 weeks.   Sister has multiple pictures and video. Functional Assessment:  Activities of Daily Living (ADLs):   She is independent in the following: ambulation with the use of a walker Requires assistance with the following: bathing and hygiene, feeding, continence, grooming, toileting and dressing Instrumental Activities of Daily Living (IADLs):   She is  independent in the following: at all at this point Requires assistance with the following: all ADLS   Past Medical History:  Diagnosis Date   Cancer ([place])    COPD (chronic obstructive pulmonary disease) ([place])    Hypertension    Liver disease    Renal disorder     Past Surgical History:  Procedure Laterality Date   PORT-A-CATH REMOVAL      Family History  Problem Relation Age of Onset   Healthy Mother    Hypertension Other     Social History   Socioeconomic History   Marital status: Single    Spouse name: Not on file   Number of children: Not on file   Years of education: Not on file   Highest education level: Not on file  Occupational History   Not on file  Tobacco Use   Smoking status: Current Every Day Smoker    Packs/day: 0.25    Types: Cigarettes   Smokeless tobacco: Never Used   Tobacco comment: Willing to work on smoking cessation  Vaping Use   Vaping Use: Never used  Substance and Sexual Activity   Alcohol use: Not Currently   Drug use: Not Currently   Sexual activity: Not Currently  Other Topics Concern   Not on file  Social History Narrative   Not on file   Social Determinants of Health   Financial Resource Strain:    Difficulty of Paying Living Expenses:   Food Insecurity:    Worried About Estate manager/land agent of Food in the Last Year:    Ran  Out of Food in the Last Year:   Transportation Needs:    Lack of Transportation (Medical):    Lack of Transportation (Non-Medical):   Physical Activity:    Days of Exercise per Week:    Minutes of Exercise per Session:   Stress:    Feeling of Stress :   Social Connections:    Frequency of Communication with Friends and Family:    Frequency of Social Gatherings with Friends and Family:    Attends Religious Services:    Active Member of Clubs or Organizations:    Attends Music therapist:    Marital Status:   Intimate Partner Violence:    Fear of Current or  Ex-Partner:    Emotionally Abused:    Physically Abused:    Sexually Abused:     Outpatient Medications Prior to Visit  Medication Sig Dispense Refill   albuterol (VENTOLIN HFA) 108 (90 Base) MCG/ACT inhaler Inhale 2 puffs into the lungs every 6 (six) hours as needed for wheezing or shortness of breath. 8 g 1   cholestyramine (QUESTRAN) 4 g packet Take 4 g by mouth daily.     feeding supplement, ENSURE ENLIVE, (ENSURE ENLIVE) LIQD Take 237 mLs by mouth 2 (two) times daily between meals. 237 mL 12   furosemide (LASIX) 20 MG tablet Take 20 mg by mouth.     gabapentin (NEURONTIN) 100 MG capsule Take 200 mg by mouth 3 (three) times daily.     hydrOXYzine (ATARAX/VISTARIL) 10 MG tablet Take 1 tablet (10 mg total) by mouth 3 (three) times daily as needed for itching or anxiety. 30 tablet 0   lactulose (CHRONULAC) 10 GM/15ML solution Take 30 mLs (20 g total) by mouth 3 (three) times daily. 1892 mL 1   loratadine (CLARITIN) 10 MG tablet Take 1 tablet (10 mg total) by mouth daily. 90 tablet 0   LORazepam (ATIVAN) 0.5 MG tablet Take 0.5 mg by mouth 3 (three) times daily as needed.     memantine (NAMENDA) 5 MG tablet Take 1 tablet (5 mg total) by mouth 2 (two) times daily. (Patient taking differently: Take 10 mg by mouth 2 (two) times daily. ) 60 tablet 2   pantoprazole (PROTONIX) 40 MG tablet Take 40 mg by mouth daily.     PARoxetine (PAXIL) 40 MG tablet Take 1 tablet (40 mg total) by mouth every morning. 30 tablet 2   rifaximin (XIFAXAN) 550 MG TABS tablet Take 1 tablet (550 mg total) by mouth 2 (two) times daily. 42 tablet 0   Sofosbuvir-Velpatasvir (EPCLUSA) 400-100 MG TABS Take 1 tablet by mouth daily. 28 tablet 3   spironolactone (ALDACTONE) 100 MG tablet Take 0.5 tablets (50 mg total) by mouth daily. (Patient taking differently: Take 100 mg by mouth daily. )     zolpidem (AMBIEN) 5 MG tablet Take 0.5 tablets (2.5 mg total) by mouth at bedtime as needed for sleep. 45 tablet 0    albuterol (PROVENTIL) (2.5 MG/3ML) 0.083% nebulizer solution Take 3 mLs (2.5 mg total) by nebulization every 6 (six) hours as needed for wheezing or shortness of breath. (Patient not taking: Reported on 08/03/2019) 150 mL 1   Multiple Vitamin (MULTIVITAMIN WITH MINERALS) TABS tablet Take 1 tablet by mouth daily.     thiamine 100 MG tablet Take 1 tablet (100 mg total) by mouth daily. (Patient not taking: Reported on 08/03/2019)     No facility-administered medications prior to visit.    No Known Allergies  ROS Review of Systems  Constitutional: Positive for activity change and appetite change.  Cardiovascular: Positive for leg swelling.  Skin: Positive for rash.  Neurological: Positive for tremors and weakness.      Objective:    Physical Exam Constitutional:      Appearance: She is ill-appearing and diaphoretic.  HENT:     Head: Normocephalic and atraumatic.     Nose: Nose normal.     Mouth/Throat:     Mouth: Mucous membranes are moist.  Cardiovascular:     Rate and Rhythm: Normal rate.  Pulmonary:     Effort: Pulmonary effort is normal.  Musculoskeletal:     Cervical back: Normal range of motion.     Right lower leg: Edema present.     Left lower leg: Edema present.  Neurological:     Motor: Weakness present.     Gait: Gait abnormal.     Comments: lethargic but easily aroused and cooperative     BP 117/72 (BP Location: Right Arm, Patient Position: Sitting, Cuff Size: Normal)    Pulse 82    Temp 97.7 F (36.5 C)    Resp 17    Ht 5\' 2"  (1.575 m)    Wt 129 lb 9.6 oz (58.8 kg)    SpO2 100%    BMI 23.70 kg/m  Wt Readings from Last 3 Encounters:  08/03/19 129 lb 9.6 oz (58.8 kg)  07/28/19 127 lb 6.4 oz (57.8 kg)  06/05/19 119 lb 0.8 oz (54 kg)     Health Maintenance Due  Topic Date Due   COVID-19 Vaccine (1) Never done    There are no preventive care reminders to display for this patient.  Lab Results  Component Value Date   TSH 0.992 06/05/2019   Lab  Results  Component Value Date   WBC 2.7 (L) 07/06/2019   HGB 9.1 (L) 07/06/2019   HCT 27.7 (L) 07/06/2019   MCV 98.2 07/06/2019   PLT 65 (L) 07/06/2019   Lab Results  Component Value Date   NA 134 (L) 07/06/2019   K 4.8 07/06/2019   CO2 19 (L) 07/06/2019   GLUCOSE 132 (H) 07/06/2019   BUN 16 07/06/2019   CREATININE 1.41 (H) 07/06/2019   BILITOT 1.3 (H) 07/06/2019   ALKPHOS 89 06/07/2019   AST 33 07/06/2019   ALT 18 07/06/2019   PROT 7.1 07/06/2019   ALBUMIN 2.8 (L) 06/07/2019   CALCIUM 9.0 07/06/2019   ANIONGAP 7 06/07/2019   No results found for: CHOL No results found for: HDL No results found for: LDLCALC No results found for: TRIG No results found for: CHOLHDL Lab Results  Component Value Date   HGBA1C 5.1 06/01/2019      Assessment & Plan:   Problem List Items Addressed This Visit    None    Visit Diagnoses    Failure to thrive in adult    -  Primary   Localized edema       Dementia with behavioral disturbance, unspecified dementia type ([place])       Lethargic     Sent to the ER with the Nurse and sister for further evaluation and skilled nursing placement        No orders of the defined types were placed in this encounter.   Follow-up: Return in about 3 months (around 11/03/2019).    Vevelyn Francois, NP

## 2019-08-03 NOTE — H&P (Signed)
History and Physical    Judy Meza UUV:[id number] DOB: [birth year] DOA: 08/03/2019  PCP: Vevelyn Francois, NP  Patient coming from: Home.  I have personally briefly reviewed patient's old medical records in [facility]  Chief Complaint: Falls and AMS.  HPI: Judy Meza is a 59 y.o. female with medical history significant of laryngeal cancer in remission, COPD, active smoker, hypertension, alcoholic liver cirrhosis with acquired pancytopenia, stage 3a CKD, chronic hepatitis C on [place] who is brought to the emergency department by her sisters due to multiple falls in the last 2 weeks with complaints of back pain, worsening confusion as the patient has tried to go outside naked and has refused transportation to the hospital by EMS twice after they are called to evaluate her.  They have also noticed that the patient is weaker on the left side.  She wakes up briefly and knows she is in the hospital, but is unable to give any elaborated answers.  Her sisters are concerned that she is a danger to herself given her recent multiple falls and change in behavior.  ED Course: Initial vital signs were 97.5 F, pulse 84, respiration 18, blood pressure 147/99 mmHg and O2 sat 98% on room air.  The patient was given 10 g of lactulose in the emergency department.  Urinalysis was unremarkable.  UDS was negative.  SARS coronavirus 2 PCR was negative.  CBC showed a white count of 2.2, hemoglobin of 10.0 g/dL and platelets 63.  CMP shows a CO2 of 21 mmol/L with a normal anion gap, all other electrolytes are within normal range.  Glucose 93, BUN 15 creatinine 1.31 mg/dL.  Total protein 7.8 and albumin 3.3 g/dL.  AST was 56 and ALT 38.  Total bilirubin was 1.9 mg/dL.  TSH was 3.803.  Imaging: A single portable chest radiograph did not find any active disease.  CT head without contrast showed generalized atrophy, but no acute intracranial abnormality.  Please see images and full radiology report for further  detail.  Review of Systems: As per HPI otherwise all other systems reviewed and are negative.  Past Medical History:  Diagnosis Date  . Cancer ([place])   . COPD (chronic obstructive pulmonary disease) ([place])   . Hypertension   . Liver disease   . Renal disorder    Past Surgical History:  Procedure Laterality Date  . PORT-A-CATH REMOVAL     Social History  reports that she has been smoking cigarettes. She has been smoking about 0.25 packs per day. She has never used smokeless tobacco. She reports previous alcohol use. She reports previous drug use.  No Known Allergies  Family History  Problem Relation Age of Onset  . Healthy Mother   . Hypertension Other    Prior to Admission medications   Medication Sig Start Date End Date Taking? Authorizing Provider  albuterol (PROVENTIL) (2.5 MG/3ML) 0.083% nebulizer solution Take 3 mLs (2.5 mg total) by nebulization every 6 (six) hours as needed for wheezing or shortness of breath. 06/01/19  Yes Vevelyn Francois, NP  albuterol (VENTOLIN HFA) 108 (90 Base) MCG/ACT inhaler Inhale 2 puffs into the lungs every 6 (six) hours as needed for wheezing or shortness of breath. 06/01/19 08/30/19 Yes King, Diona Foley, NP  cholestyramine (QUESTRAN) 4 g packet Take 4 g by mouth daily.   Yes [provider]  feeding supplement, ENSURE ENLIVE, (ENSURE ENLIVE) LIQD Take 237 mLs by mouth 2 (two) times daily between meals. 05/02/19  Yes Eugenie Filler, MD  furosemide (LASIX) 20 MG tablet Take 20 mg by mouth daily.    Yes [provider]  gabapentin (NEURONTIN) 100 MG capsule Take 200 mg by mouth 3 (three) times daily.   Yes [provider]  hydrOXYzine (ATARAX/VISTARIL) 10 MG tablet Take 1 tablet (10 mg total) by mouth 3 (three) times daily as needed for itching or anxiety. 06/01/19 11/19/21 Yes King, Diona Foley, NP  lactulose (CHRONULAC) 10 GM/15ML solution Take 30 mLs (20 g total) by mouth 3 (three) times daily. 06/01/19  Yes Vevelyn Francois, NP   loratadine (CLARITIN) 10 MG tablet Take 1 tablet (10 mg total) by mouth daily. 06/01/19 08/30/19 Yes King, Diona Foley, NP  LORazepam (ATIVAN) 0.5 MG tablet Take 0.5 mg by mouth at bedtime.    Yes [provider]  memantine (NAMENDA) 5 MG tablet Take 1 tablet (5 mg total) by mouth 2 (two) times daily. 06/01/19 08/30/19 Yes King, Diona Foley, NP  pantoprazole (PROTONIX) 40 MG tablet Take 40 mg by mouth daily.   Yes [provider]  PARoxetine (PAXIL) 40 MG tablet Take 1 tablet (40 mg total) by mouth every morning. 06/01/19 08/30/19 Yes Vevelyn Francois, NP  rifaximin (XIFAXAN) 550 MG TABS tablet Take 1 tablet (550 mg total) by mouth 2 (two) times daily. Patient taking differently: Take 550 mg by mouth daily.  06/09/19  Yes King, Diona Foley, NP  Sofosbuvir-Velpatasvir (EPCLUSA) 400-100 MG TABS Take 1 tablet by mouth daily. 07/12/19  Yes Kuppelweiser, Cassie L, RPH-CPP  spironolactone (ALDACTONE) 100 MG tablet Take 0.5 tablets (50 mg total) by mouth daily. Patient taking differently: Take 100 mg by mouth daily.  05/11/19  Yes Oretha Milch D, MD  thiamine 100 MG tablet Take 1 tablet (100 mg total) by mouth daily. Patient not taking: Reported on 08/03/2019 06/08/19   Rai, Vernelle Emerald, MD  zolpidem (AMBIEN) 5 MG tablet Take 0.5 tablets (2.5 mg total) by mouth at bedtime as needed for sleep. Patient not taking: Reported on 08/03/2019 06/01/19 08/30/19  Vevelyn Francois, NP    Physical Exam: Vitals:   08/03/19 1815 08/03/19 1830 08/03/19 2015 08/03/19 2100  BP: (!) 163/121 (!) 155/103 (!) 152/101 (!) 145/111  Pulse:  82 88   Resp: (!) 24 13 15 12   Temp:      TempSrc:      SpO2:  100% 100%     Constitutional: Looks chronically ill. Eyes: PERRL, lids and conjunctivae mildly injected.  Positive scleral icterus. ENMT: Mucous membranes are dry. Posterior pharynx clear of any exudate or lesions. Neck: normal, supple, no masses, no thyromegaly, tracheostomy dressing in place Respiratory: Decreased  breath sounds in bases, otherwise clear to auscultation bilaterally, no wheezing, no crackles. Normal respiratory effort. No accessory muscle use.  Cardiovascular: Regular rate and rhythm, no murmurs / rubs / gallops. No extremity edema. 2+ pedal pulses. No carotid bruits.  Abdomen: Nondistended.  BS positive, soft, no tenderness, no masses palpated. No hepatosplenomegaly. Musculoskeletal: no clubbing / cyanosis. Good ROM, no contractures. Normal muscle tone.  Skin: Areas of ecchymosis on extremities, back and abdomen Neurologic: CN 2-12 grossly intact. Sensation intact, DTR normal. Strength 5/5 in all 4.  Psychiatric: Normal judgment and insight. Alert and oriented x 3. Normal mood.   Labs on Admission: I have personally reviewed following labs and imaging studies  CBC: Recent Labs  Lab 08/03/19 1752  WBC 2.2*  NEUTROABS 1.1*  HGB 10.0*  HCT 31.5*  MCV 97.8  PLT 63*  Basic Metabolic Panel: Recent Labs  Lab 08/03/19 1752  NA 136  K 4.6  CL 106  CO2 21*  GLUCOSE 93  BUN 15  CREATININE 1.31*  CALCIUM 9.7    GFR: Estimated Creatinine Clearance: 37.5 mL/min (A) (by C-G formula based on SCr of 1.31 mg/dL (H)).  Liver Function Tests: Recent Labs  Lab 08/03/19 1752  AST 56*  ALT 38  ALKPHOS 54  BILITOT 1.9*  PROT 7.8  ALBUMIN 3.3*    Urine analysis:    Component Value Date/Time   COLORURINE STRAW (A) 08/03/2019 1855   APPEARANCEUR CLEAR 08/03/2019 1855   LABSPEC 1.009 08/03/2019 1855   PHURINE 8.0 08/03/2019 1855   GLUCOSEU NEGATIVE 08/03/2019 1855   HGBUR NEGATIVE 08/03/2019 1855   BILIRUBINUR NEGATIVE 08/03/2019 1855   KETONESUR NEGATIVE 08/03/2019 1855   PROTEINUR NEGATIVE 08/03/2019 1855   NITRITE NEGATIVE 08/03/2019 1855   LEUKOCYTESUR NEGATIVE 08/03/2019 1855    Radiological Exams on Admission: CT Head Wo Contrast  Result Date: 08/03/2019 CLINICAL DATA:  Altered mental status with bilateral leg weakness. EXAM: CT HEAD WITHOUT CONTRAST TECHNIQUE:  Contiguous axial images were obtained from the base of the skull through the vertex without intravenous contrast. COMPARISON:  None. FINDINGS: Brain: There is mild cerebral atrophy with widening of the extra-axial spaces and ventricular dilatation. There are areas of decreased attenuation within the white matter tracts of the supratentorial brain, consistent with microvascular disease changes. Vascular: No hyperdense vessel or unexpected calcification. Skull: Normal. Negative for fracture or focal lesion. Sinuses/Orbits: No acute finding. Other: None. IMPRESSION: 1. Generalized cerebral atrophy. 2. No acute intracranial abnormality. Electronically Signed   By: Virgina Norfolk M.D.   On: 08/03/2019 19:28   DG Chest Portable 1 View  Result Date: 08/03/2019 CLINICAL DATA:  Found on the floor. EXAM: PORTABLE CHEST 1 VIEW COMPARISON:  Jun 04, 2019 FINDINGS: Overlying cardiac lead wires are seen. There is no evidence of acute infiltrate, pleural effusion or pneumothorax. The heart size and mediastinal contours are within normal limits. The visualized skeletal structures are unremarkable. IMPRESSION: No active disease. Electronically Signed   By: Virgina Norfolk M.D.   On: 08/03/2019 18:36    EKG: Independently reviewed.   Assessment/Plan Principal Problem:   Acute hepatic encephalopathy Observation/telemetry. Continue supplemental oxygen. Continue lactulose 20 g p.o. 3 times daily. She is also on Questran and Xifaxan.Marland Kitchen Neurochecks every 4 hours. Follow-up ammonia level in a.m.  Active Problems:   Multiple falls at home Check TLS spine x-rays. Consider PT evaluation once more alert.    Alcoholic cirrhosis of liver with ascites (HCC) Continue lactulose 20 g p.o. 3 times daily. Continue thiamine 100 mg p.o. daily. Continue furosemide 20 mg p.o. daily. Continue spironolactone 100 mg p.o. daily. Continue Questran 4 g p.o. daily. Continue Xifaxan 550 mg p.o. twice daily.    Dementia without  behavioral disturbance (HCC) Continue hydroxyzine for anxiety or itching. Continue Namenda 5 mg p.o. twice daily. Continue paroxetine 40 mg p.o. daily. Hold Ambien at bedtime. May need assisted living or SNF.    CKD (chronic kidney disease), stage III Monitor renal function electrolytes.    COPD with chronic bronchitis ([place]) Supplemental oxygen as needed. Bronchodilators as needed.    Tobacco abuse Nicotine replacement therapy as needed.    Pancytopenia, acquired ([place]) Monitor CBC.    DVT prophylaxis: On heparin infusion.  Recently held Xarelto. Code Status:   Full code. Family Communication: Disposition Plan:   Patient is from:  Home.  Anticipated DC  to:  TBD.  Anticipated DC date:  08/05/2019.  Anticipated DC barriers: Clinical status  Consults called: Admission status:  Observation/telemetry.  Severity of Illness:  Reubin Milan MD [facility]  How to contact the [facility] Attending or Consulting provider [place] or covering provider during after hours [place], for this patient?   1. Check the care team in [facility] and look for a) attending/consulting [facility] provider listed and b) the [facility] team listed 2. Log into [email or web address] and use [place]'s universal password to access. If you do not have the password, please contact the hospital operator. 3. Locate the [facility] provider you are looking for under [facility] and page to a number that you can be directly reached. 4. If you still have difficulty reaching the provider, please page the [facility] (Director on Call) for the Hospitalists listed on amion for assistance.  08/03/2019, 9:22 PM   This document was prepared using Dragon voice recognition software and may contain some unintended transcription errors.

## 2019-08-03 NOTE — ED Provider Notes (Signed)
Emergency Department Provider Note   I have reviewed the triage vital signs and the nursing notes.   HISTORY  Chief Complaint Leg Swelling   HPI Judy Meza is a 59 y.o. female with PMH of Hep C with cirrhosis, COPD, HTN, CKD, and remote history of IVDA presents to the ED from her PCP office with AMS and worsening somnolence and confusion.  The patient's sister is at bedside and provides most of the history.  The patient has had increased weakness and confusion over the past several days especially.  They have found her on the ground in her apartment multiple times in the last few days. She is falling asleep while eating. She is confused as well. Family has called EMS several times but patient has been awake enough to refuse transport until today when she went to see her PCP. Sister suspects that ammonia is high. Other than somnolence the sister has noticed occasional twitching to the left face and that the patient is seeming weak on the left.   Level 5 caveat: Encephalopathy.   Past Medical History:  Diagnosis Date  . Cancer ( [place])   . COPD (chronic obstructive pulmonary disease) ([place])   . 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 Hypertension   . Liver disease   . Renal disorder     Patient Active Problem List   Diagnosis Date Noted  . Multiple falls at home 08/03/2019  . 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 Goals of care, counseling/discussion 07/28/2019  . Acute encephalopathy 06/05/2019  . Chest pain 06/04/2019  . QT prolongation 06/04/2019  . Metabolic acidosis 19/37/9024  . Dementia without behavioral disturbance ([place]) 05/10/2019  . Chronic hepatitis C without hepatic coma ([place]) 05/10/2019  . Hepatic encephalopathy ([place]) 05/09/2019  . Pancytopenia, acquired ([place])   . Cirrhosis of liver ([place])   . CKD (chronic kidney disease), stage III 04/29/2019  . COPD with chronic bronchitis ([place]) 04/29/2019  . Tobacco abuse 04/29/2019  . Alcoholism in remission  ([place]) 04/29/2019  . Acute hepatic encephalopathy 04/29/2019  . AKI (acute kidney injury) ([place]) 04/29/2019  . Alcoholic cirrhosis of liver with ascites ([place]) 04/29/2019  . Thrombocytopenia ([place]) 04/29/2019  . Hyperkalemia 04/29/2019    Past Surgical History:  Procedure Laterality Date  . PORT-A-CATH REMOVAL      Allergies Patient has no known allergies.  Family History  Problem Relation Age of Onset  . Healthy Mother   . Hypertension Other     Social History Social History   Tobacco Use  . Smoking status: Current Every Day Smoker    Packs/day: 0.25    Types: Cigarettes  . Smokeless tobacco: Never Used  . Tobacco comment: Willing to work on smoking cessation  Vaping Use  . Vaping Use: Never used  Substance Use Topics  . Alcohol use: Not Currently  . Drug use: Not Currently    Review of Systems  Level 5 caveat: Encephalopathy   ____________________________________________   PHYSICAL EXAM:  VITAL SIGNS: ED Triage Vitals  Enc Vitals Group     BP 08/03/19 1210 (!) 147/99     Pulse Rate 08/03/19 1210 84     Resp 08/03/19 1210 18     Temp 08/03/19 1210 (!) 97.5 F (36.4 C)     Temp Source 08/03/19 1210 Oral     SpO2 08/03/19 1210 98 %   Constitutional: Somnolent but arouses to voice but not conversational. Can assist with small tasks such as putting on a  hospital gown but then goes back to sleep.  Eyes: Conjunctivae are normal. PERRL. Head: Atraumatic. Nose: No congestion/rhinnorhea. Mouth/Throat: Mucous membranes are moist.  Neck: No stridor. Stoma noted.  Cardiovascular: Normal rate, regular rhythm. Good peripheral circulation. Grossly normal heart sounds.   Respiratory: Normal respiratory effort.  No retractions. Lungs CTAB. Gastrointestinal: Soft and nontender. No distention.  Musculoskeletal: No lower extremity tenderness with 3 + pitting edema. No gross deformities of extremities. Neurologic: Somnolent but moving all extremities equally. Participating  with parts of exam but not fully engaged.  Skin:  Skin is warm, dry and intact. No rash noted.   ____________________________________________   LABS (all labs ordered are listed, but only abnormal results are displayed)  Labs Reviewed  COMPREHENSIVE METABOLIC PANEL - Abnormal; Notable for the following components:      Result Value   CO2 21 (*)    Creatinine, Ser 1.31 (*)    Albumin 3.3 (*)    AST 56 (*)    Total Bilirubin 1.9 (*)    GFR calc non Af Amer 45 (*)    GFR calc Af Amer 52 (*)    All other components within normal limits  LIPASE, BLOOD - Abnormal; Notable for the following components:   Lipase 75 (*)    All other components within normal limits  CBC WITH DIFFERENTIAL/PLATELET - Abnormal; Notable for the following components:   WBC 2.2 (*)    RBC 3.22 (*)    Hemoglobin 10.0 (*)    HCT 31.5 (*)    RDW 16.1 (*)    Platelets 63 (*)    Neutro Abs 1.1 (*)    All other components within normal limits  AMMONIA - Abnormal; Notable for the following components:   Ammonia 72 (*)    All other components within normal limits  URINALYSIS, ROUTINE W REFLEX MICROSCOPIC - Abnormal; Notable for the following components:   Color, Urine STRAW (*)    All other components within normal limits  CBC - Abnormal; Notable for the following components:   WBC 3.2 (*)    RBC 3.00 (*)    Hemoglobin 9.4 (*)    HCT 28.9 (*)    RDW 16.3 (*)    Platelets 66 (*)    All other components within normal limits  COMPREHENSIVE METABOLIC PANEL - Abnormal; Notable for the following components:   CO2 20 (*)    Creatinine, Ser 1.21 (*)    Albumin 2.9 (*)    AST 46 (*)    Total Bilirubin 1.9 (*)    GFR calc non Af Amer 50 (*)    GFR calc Af Amer 58 (*)    All other components within normal limits  AMMONIA - Abnormal; Notable for the following components:   Ammonia 65 (*)    All other components within normal limits  SARS CORONAVIRUS 2 BY RT PCR (HOSPITAL ORDER, [place]  [facility])  TSH  RAPID URINE DRUG SCREEN, HOSP PERFORMED   ____________________________________________  EKG  Rate: 84 PR: 154 QTc: 472  Sinus rhythm. Narrow QRS. No ST elevation or depression. No STEMI.  ____________________________________________  RADIOLOGY  DG THORACOLUMABAR SPINE  Result Date: 08/03/2019 CLINICAL DATA:  Multiple falls. EXAM: THORACOLUMBAR SPINE 1V COMPARISON:  None. FINDINGS: It should be noted that the T1 through T4 vertebral bodies are limited in evaluation secondary to image caught off. There is no evidence of acute fracture within the visualized portion of the thoracolumbar spine. There is normal alignment. Normal multilevel  endplates and normal multilevel intervertebral disc spaces are seen. Mild calcification of the abdominal aorta is seen. IMPRESSION: Limited study, as described above, without evidence of acute fracture within the visualized portion of the thoracolumbar spine. Electronically Signed   By: Virgina Norfolk M.D.   On: 08/03/2019 22:09   CT Head Wo Contrast  Result Date: 08/03/2019 CLINICAL DATA:  Altered mental status with bilateral leg weakness. EXAM: CT HEAD WITHOUT CONTRAST TECHNIQUE: Contiguous axial images were obtained from the base of the skull through the vertex without intravenous contrast. COMPARISON:  None. FINDINGS: Brain: There is mild cerebral atrophy with widening of the extra-axial spaces and ventricular dilatation. There are areas of decreased attenuation within the white matter tracts of the supratentorial brain, consistent with microvascular disease changes. Vascular: No hyperdense vessel or unexpected calcification. Skull: Normal. Negative for fracture or focal lesion. Sinuses/Orbits: No acute finding. Other: None. IMPRESSION: 1. Generalized cerebral atrophy. 2. No acute intracranial abnormality. Electronically Signed   By: Virgina Norfolk M.D.   On: 08/03/2019 19:28   CT ANGIO CHEST PE W OR WO CONTRAST  Result Date:  08/04/2019 CLINICAL DATA:  Cough. Episode of hypoxia this morning, improved after coughing up thick secretions. Negative D-dimer. EXAM: CT ANGIOGRAPHY CHEST WITH CONTRAST TECHNIQUE: Multidetector CT imaging of the chest was performed using the standard protocol during bolus administration of intravenous contrast. Multiplanar CT image reconstructions and MIPs were obtained to evaluate the vascular anatomy. CONTRAST:  30mL OMNIPAQUE IOHEXOL 350 MG/ML SOLN COMPARISON:  Portable chest dated 08/03/2019 FINDINGS: Cardiovascular: Atheromatous calcifications, including the coronary arteries and aorta. Normally opacified pulmonary arteries with no pulmonary arterial filling defects seen. Mediastinum/Nodes: No enlarged mediastinal, hilar, or axillary lymph nodes. Thyroid gland, trachea, and esophagus demonstrate no significant findings. Lungs/Pleura: Right lower lobe atelectasis.  No pleural fluid. Upper Abdomen: Small amount of free peritoneal fluid. Musculoskeletal: Mild thoracic spine degenerative changes. Review of the MIP images confirms the above findings. IMPRESSION: 1. No pulmonary emboli. 2. Right lower lobe atelectasis. 3. Small amount of free peritoneal fluid. 4.  Calcific coronary artery and aortic atherosclerosis. Aortic Atherosclerosis (ICD10-I70.0). Electronically Signed   By: Claudie Revering M.D.   On: 08/04/2019 08:33   DG Chest Portable 1 View  Result Date: 08/03/2019 CLINICAL DATA:  Found on the floor. EXAM: PORTABLE CHEST 1 VIEW COMPARISON:  Jun 04, 2019 FINDINGS: Overlying cardiac lead wires are seen. There is no evidence of acute infiltrate, pleural effusion or pneumothorax. The heart size and mediastinal contours are within normal limits. The visualized skeletal structures are unremarkable. IMPRESSION: No active disease. Electronically Signed   By: Virgina Norfolk M.D.   On: 08/03/2019 18:36    ____________________________________________   PROCEDURES  Procedure(s) performed:    Procedures  CRITICAL CARE Performed by: Margette Fast Total critical care time: 35 minutes Critical care time was exclusive of separately billable procedures and treating other patients. Critical care was necessary to treat or prevent imminent or life-threatening deterioration. Critical care was time spent personally by me on the following activities: development of treatment plan with patient and/or surrogate as well as nursing, discussions with consultants, evaluation of patient's response to treatment, examination of patient, obtaining history from patient or surrogate, ordering and performing treatments and interventions, ordering and review of laboratory studies, ordering and review of radiographic studies, pulse oximetry and re-evaluation of patient's condition.  Nanda Quinton, MD Emergency Medicine  ____________________________________________   INITIAL IMPRESSION / ASSESSMENT AND PLAN / ED COURSE  Pertinent labs & imaging results that  were available during my care of the patient were reviewed by me and considered in my medical decision making (see chart for details).   Patient presents with encephalopathy in the setting on known cirrhosis. Suspect elevated ammonia but will need CT head given being found on the floor multiple times. No twitching or focal neuro deficit. Patient more globally encephalopathic.   Ammonia elevated. Will give lactulose and follow clinically. Plan for admit.   Discussed patient's case with [facility] to request admission. Patient and family (if present) updated with plan. Care transferred to [facility] service.  I reviewed all nursing notes, vitals, pertinent old records, EKGs, labs, imaging (as available).  ____________________________________________  FINAL CLINICAL IMPRESSION(S) / ED DIAGNOSES  Final diagnoses:  Encephalopathy acute     MEDICATIONS GIVEN DURING THIS VISIT:  Medications  rifaximin (XIFAXAN) tablet 550 mg (550 mg Oral Given 08/04/19 1010)   Sofosbuvir-Velpatasvir 400-100 MG TABS 1 tablet (1 tablet Oral Not Given 08/04/19 1014)  spironolactone (ALDACTONE) tablet 100 mg (100 mg Oral Given 08/04/19 1010)  PARoxetine (PAXIL) tablet 40 mg (40 mg Oral Given 08/04/19 0839)  pantoprazole (PROTONIX) EC tablet 40 mg ( Oral Canceled Entry 08/03/19 2359)  memantine (NAMENDA) tablet 5 mg (5 mg Oral Given 08/04/19 0840)  albuterol (PROVENTIL) (2.5 MG/3ML) 0.083% nebulizer solution 2.5 mg (has no administration in time range)  cholestyramine light (PREVALITE) packet 4 g (4 g Oral Given 08/04/19 1053)  furosemide (LASIX) tablet 20 mg (20 mg Oral Given 08/04/19 0840)  hydrOXYzine (ATARAX/VISTARIL) tablet 10 mg (has no administration in time range)  gabapentin (NEURONTIN) capsule 200 mg (200 mg Oral Given 08/04/19 0839)  lactulose (CHRONULAC) 10 GM/15ML solution 20 g (20 g Oral Given 08/04/19 0837)  loratadine (CLARITIN) tablet 10 mg (10 mg Oral Given 08/04/19 0839)  LORazepam (ATIVAN) tablet 0.5 mg (0.5 mg Oral Given 08/04/19 0026)  thiamine tablet 100 mg (100 mg Oral Given 08/04/19 0840)  lactulose (CHRONULAC) 10 GM/15ML solution 10 g (10 g Oral Given 08/03/19 2017)  sodium chloride (PF) 0.9 % injection (1 mL  Given by Other 08/04/19 0909)  iohexol (OMNIPAQUE) 350 MG/ML injection 100 mL (80 mLs Intravenous Contrast Given 08/04/19 0807)     Note:  This document was prepared using Dragon voice recognition software and may include unintentional dictation errors.  Nanda Quinton, MD, [facility] Emergency Medicine    Dianna Deshler, [facility], MD 08/04/19 [phone]

## 2019-08-03 NOTE — ED Triage Notes (Addendum)
Pt presents via family with c/o bilateral leg weakness. Family reports that pt has been found on the floor for the past several days. Per family, pt has been very irritable. When this RN attempts to talk to pt, she gets very aggravated and will not answer any questions. Family is requesting placement for pt. Family is concerned of pt's ammonia level.

## 2019-08-03 NOTE — Patient Instructions (Addendum)
Dementia Caregiver Guide Dementia is a term used to describe a number of symptoms that affect memory and thinking. The most common symptoms include:  Memory loss.  Trouble with language and communication.  Trouble concentrating.  Poor judgment.  Problems with reasoning.  Child-like behavior and language.  Extreme anxiety.  Angry outbursts.  Wandering from home or public places. Dementia usually gets worse slowly over time. In the early stages, people with dementia can stay independent and safe with some help. In later stages, they need help with daily tasks such as dressing, grooming, and using the bathroom. How to help the person with dementia cope Dementia can be frightening and confusing. Here are some tips to help the person with dementia cope with changes caused by the disease. General tips  Keep the person on track with his or her routine.  Try to identify areas where the person may need help.  Be supportive, patient, calm, and encouraging.  Gently remind the person that adjusting to changes takes time.  Help with the tasks that the person has asked for help with.  Keep the person involved in daily tasks and decisions as much as possible.  Encourage conversation, but try not to get frustrated or harried if the person struggles to find words or does not seem to appreciate your help. Communication tips  When the person is talking or seems frustrated, make eye contact and hold the person's hand.  Ask specific questions that need yes or no answers.  Use simple words, short sentences, and a calm voice. Only give one direction at a time.  When offering choices, limit them to just 1 or 2.  Avoid correcting the person in a negative way.  If the person is struggling to find the right words, gently try to help him or her. How to recognize symptoms of stress Symptoms of stress in caregivers include:  Feeling frustrated or angry with the person with  dementia.  Denying that the person has dementia or that his or her symptoms will not improve.  Feeling hopeless and unappreciated.  Difficulty sleeping.  Difficulty concentrating.  Feeling anxious, irritable, or depressed.  Developing stress-related health problems.  Feeling like you have too little time for your own life. Follow these instructions at home:   Make sure that you and the person you are caring for: ? Get regular sleep. ? Exercise regularly. ? Eat regular, nutritious meals. ? Drink enough fluid to keep your urine clear or pale yellow. ? Take over-the-counter and prescription medicines only as told by your health care providers. ? Attend all scheduled health care appointments.  Join a support group with others who are caregivers.  Ask about respite care resources so that you can have a regular break from the stress of caregiving.  Look for signs of stress in yourself and in the person you are caring for. If you notice signs of stress, take steps to manage it.  Consider any safety risks and take steps to avoid them.  Organize medications in a pill box for each day of the week.  Create a plan to handle any legal or financial matters. Get legal or financial advice if needed.  Keep a calendar in a central location to remind the person of appointments or other activities. Tips for reducing the risk of injury  Keep floors clear of clutter. Remove rugs, magazine racks, and floor lamps.  Keep hallways well lit, especially at night.  Put a handrail and nonslip mat in the bathtub or   shower.  Put childproof locks on cabinets that contain dangerous items, such as medicines, alcohol, guns, toxic cleaning items, sharp tools or utensils, matches, and lighters.  Put the locks in places where the person cannot see or reach them easily. This will help ensure that the person does not wander out of the house and get lost.  Be prepared for emergencies. Keep a list of  emergency phone numbers and addresses in a convenient area.  Remove car keys and lock garage doors so that the person does not try to get in the car and drive.  Have the person wear a bracelet that tracks locations and identifies the person as having memory problems. This should be worn at all times for safety. Where to find support: Many individuals and organizations offer support. These include:  Support groups for people with dementia and for caregivers.  Counselors or therapists.  Home health care services.  Adult day care centers. Where to find more information Alzheimer's Association: [employer] Contact a health care provider if:  The person's health is rapidly getting worse.  You are no longer able to care for the person.  Caring for the person is affecting your physical and emotional health.  The person threatens himself or herself, you, or anyone else. Summary  Dementia is a term used to describe a number of symptoms that affect memory and thinking.  Dementia usually gets worse slowly over time.  Take steps to reduce the person's risk of injury, and to plan for future care.  Caregivers need support, relief from caregiving, and time for their own lives. This information is not intended to replace advice given to you by your health care provider. Make sure you discuss any questions you have with your health care provider. Document Revised: 12/11/2016 Document Reviewed: 12/03/2015 Elsevier Patient Education  2020 [employer].    Failure to Thrive, Adult Failure to thrive is a group of symptoms that affect adults, including the elderly. These symptoms include eating too little and losing weight. People who have this may not want to be with friends, or they may not want to eat or drink. This condition is not a normal part of getting older. What are the causes?  A disease, such as dementia, diabetes, cancer, or lung disease.  A health problem, such as a vitamin  deficiency or a heart problem.  A disorder, such as sadness (depression).  A disability.  Medicines.  Having tooth or mouth problems.  Neglect or being treated badly.  In some cases, the cause may not be known. What are the signs or symptoms?  Loss of more than 5% of your body weight.  Being more tired than normal after an activity.  Having trouble getting up after sitting.  Not feeling hungry.  Not getting out of bed.  Not wanting to do your normal activities.  Sadness.  Getting infections often.  Bedsores.  Taking a long time to get better after an infection, injury, or a surgery.  Weakness. How is this treated? Treatment for this condition depends on the cause. It may be treated by:  Treating a disease or disorder that is causing symptoms.  Having talk therapy or taking medicine to treat sadness.  Eating better, such as eating more often or taking nutritional supplements.  Changing or stopping a medicine.  Having physical or occupational therapy. It often takes a team of doctors to find the right treatment. Follow these instructions at home:   Take over-the-counter and prescription medicines only as  told by your doctor.  Eat a healthy diet. Make sure that you eat enough. Ask your doctor how much you should eat.  Be active. Do exercises that make you stronger (strength training). A physical therapist can help to set up a program that fits you.  Make sure that you are safe at home.  Have a plan for what to do if you cannot make decisions for yourself. Contact a doctor if you:  Are not able to eat well.  Are not able to move around.  Feel very sad.  Feel very hopeless. Get help right away if:  You think about ending your life.  You cannot eat or drink.  You do not get out of bed.  Staying at home is not safe.  You have a fever. Summary  Failure to thrive is a group of symptoms that affect adults, including the elderly.  Symptoms  include eating too little and losing weight.  Take all medicines only as told by your doctor.  Eat a healthy diet. Make sure that you eat enough. Ask your doctor how much you should eat.  Be active. Do exercises that make you stronger. A physical therapist can help to set up a program that is right for you. This information is not intended to replace advice given to you by your health care provider. Make sure you discuss any questions you have with your health care provider. Document Revised: 08/31/2017 Document Reviewed: 08/31/2017 Elsevier Patient Education  2020 [employer].

## 2019-08-04 ENCOUNTER — Observation Stay (HOSPITAL_COMMUNITY): Payer: Medicaid Other

## 2019-08-04 ENCOUNTER — Inpatient Hospital Stay (HOSPITAL_COMMUNITY): Payer: Medicaid Other

## 2019-08-04 DIAGNOSIS — G319 Degenerative disease of nervous system, unspecified: Secondary | ICD-10-CM | POA: Diagnosis present

## 2019-08-04 DIAGNOSIS — E872 Acidosis: Secondary | ICD-10-CM

## 2019-08-04 DIAGNOSIS — R296 Repeated falls: Secondary | ICD-10-CM

## 2019-08-04 DIAGNOSIS — F0390 Unspecified dementia without behavioral disturbance: Secondary | ICD-10-CM

## 2019-08-04 DIAGNOSIS — M545 Low back pain: Secondary | ICD-10-CM | POA: Diagnosis not present

## 2019-08-04 DIAGNOSIS — Z66 Do not resuscitate: Secondary | ICD-10-CM | POA: Diagnosis present

## 2019-08-04 DIAGNOSIS — Z930 Tracheostomy status: Secondary | ICD-10-CM | POA: Diagnosis not present

## 2019-08-04 DIAGNOSIS — R05 Cough: Secondary | ICD-10-CM | POA: Diagnosis not present

## 2019-08-04 DIAGNOSIS — J449 Chronic obstructive pulmonary disease, unspecified: Secondary | ICD-10-CM | POA: Diagnosis not present

## 2019-08-04 DIAGNOSIS — Z20822 Contact with and (suspected) exposure to covid-19: Secondary | ICD-10-CM | POA: Diagnosis present

## 2019-08-04 DIAGNOSIS — W19XXXS Unspecified fall, sequela: Secondary | ICD-10-CM | POA: Diagnosis not present

## 2019-08-04 DIAGNOSIS — Z8521 Personal history of malignant neoplasm of larynx: Secondary | ICD-10-CM | POA: Diagnosis not present

## 2019-08-04 DIAGNOSIS — I10 Essential (primary) hypertension: Secondary | ICD-10-CM | POA: Diagnosis not present

## 2019-08-04 DIAGNOSIS — G8929 Other chronic pain: Secondary | ICD-10-CM | POA: Diagnosis not present

## 2019-08-04 DIAGNOSIS — B182 Chronic viral hepatitis C: Secondary | ICD-10-CM | POA: Diagnosis present

## 2019-08-04 DIAGNOSIS — K7460 Unspecified cirrhosis of liver: Secondary | ICD-10-CM

## 2019-08-04 DIAGNOSIS — R188 Other ascites: Secondary | ICD-10-CM

## 2019-08-04 DIAGNOSIS — R531 Weakness: Secondary | ICD-10-CM | POA: Diagnosis present

## 2019-08-04 DIAGNOSIS — I129 Hypertensive chronic kidney disease with stage 1 through stage 4 chronic kidney disease, or unspecified chronic kidney disease: Secondary | ICD-10-CM | POA: Diagnosis present

## 2019-08-04 DIAGNOSIS — F1011 Alcohol abuse, in remission: Secondary | ICD-10-CM | POA: Diagnosis present

## 2019-08-04 DIAGNOSIS — Z923 Personal history of irradiation: Secondary | ICD-10-CM | POA: Diagnosis not present

## 2019-08-04 DIAGNOSIS — K219 Gastro-esophageal reflux disease without esophagitis: Secondary | ICD-10-CM | POA: Diagnosis present

## 2019-08-04 DIAGNOSIS — M546 Pain in thoracic spine: Secondary | ICD-10-CM | POA: Diagnosis not present

## 2019-08-04 DIAGNOSIS — M5410 Radiculopathy, site unspecified: Secondary | ICD-10-CM | POA: Diagnosis not present

## 2019-08-04 DIAGNOSIS — D61818 Other pancytopenia: Secondary | ICD-10-CM

## 2019-08-04 DIAGNOSIS — F17210 Nicotine dependence, cigarettes, uncomplicated: Secondary | ICD-10-CM | POA: Diagnosis present

## 2019-08-04 DIAGNOSIS — D696 Thrombocytopenia, unspecified: Secondary | ICD-10-CM

## 2019-08-04 DIAGNOSIS — N1831 Chronic kidney disease, stage 3a: Secondary | ICD-10-CM

## 2019-08-04 DIAGNOSIS — Y92009 Unspecified place in unspecified non-institutional (private) residence as the place of occurrence of the external cause: Secondary | ICD-10-CM | POA: Diagnosis not present

## 2019-08-04 DIAGNOSIS — K7030 Alcoholic cirrhosis of liver without ascites: Secondary | ICD-10-CM | POA: Diagnosis not present

## 2019-08-04 DIAGNOSIS — K7290 Hepatic failure, unspecified without coma: Secondary | ICD-10-CM | POA: Diagnosis not present

## 2019-08-04 DIAGNOSIS — K7200 Acute and subacute hepatic failure without coma: Secondary | ICD-10-CM | POA: Diagnosis not present

## 2019-08-04 DIAGNOSIS — K7031 Alcoholic cirrhosis of liver with ascites: Secondary | ICD-10-CM | POA: Diagnosis not present

## 2019-08-04 DIAGNOSIS — K7040 Alcoholic hepatic failure without coma: Secondary | ICD-10-CM | POA: Diagnosis present

## 2019-08-04 LAB — COMPREHENSIVE METABOLIC PANEL
ALT: 32 U/L (ref 0–44)
AST: 46 U/L — ABNORMAL HIGH (ref 15–41)
Albumin: 2.9 g/dL — ABNORMAL LOW (ref 3.5–5.0)
Alkaline Phosphatase: 49 U/L (ref 38–126)
Anion gap: 8 (ref 5–15)
BUN: 14 mg/dL (ref 6–20)
CO2: 20 mmol/L — ABNORMAL LOW (ref 22–32)
Calcium: 9.2 mg/dL (ref 8.9–10.3)
Chloride: 107 mmol/L (ref 98–111)
Creatinine, Ser: 1.21 mg/dL — ABNORMAL HIGH (ref 0.44–1.00)
GFR calc Af Amer: 58 mL/min — ABNORMAL LOW (ref >60)
GFR calc non Af Amer: 50 mL/min — ABNORMAL LOW (ref >60)
Glucose, Bld: 77 mg/dL (ref 70–99)
Potassium: 4.2 mmol/L (ref 3.5–5.1)
Sodium: 135 mmol/L (ref 135–145)
Total Bilirubin: 1.9 mg/dL — ABNORMAL HIGH (ref 0.3–1.2)
Total Protein: 6.7 g/dL (ref 6.5–8.1)

## 2019-08-04 LAB — CBC
HCT: 28.9 % — ABNORMAL LOW (ref 36.0–46.0)
Hemoglobin: 9.4 g/dL — ABNORMAL LOW (ref 12.0–15.0)
MCH: 31.3 pg (ref 26.0–34.0)
MCHC: 32.5 g/dL (ref 30.0–36.0)
MCV: 96.3 fL (ref 80.0–100.0)
Platelets: 66 10*3/uL — ABNORMAL LOW (ref 150–400)
RBC: 3 MIL/uL — ABNORMAL LOW (ref 3.87–5.11)
RDW: 16.3 % — ABNORMAL HIGH (ref 11.5–15.5)
WBC: 3.2 10*3/uL — ABNORMAL LOW (ref 4.0–10.5)
nRBC: 0 % (ref 0.0–0.2)

## 2019-08-04 LAB — AMMONIA: Ammonia: 65 umol/L — ABNORMAL HIGH (ref 9–35)

## 2019-08-04 MED ORDER — TRAMADOL HCL 50 MG PO TABS
50.0000 mg | ORAL_TABLET | Freq: Two times a day (BID) | ORAL | Status: DC | PRN
  Administered 2019-08-04 – 2019-08-05 (×2): 50 mg via ORAL
  Filled 2019-08-04 (×2): qty 1

## 2019-08-04 MED ORDER — SODIUM CHLORIDE (PF) 0.9 % IJ SOLN
INTRAMUSCULAR | Status: AC
  Administered 2019-08-04: 1 mL
  Filled 2019-08-04: qty 50

## 2019-08-04 MED ORDER — PANTOPRAZOLE SODIUM 40 MG PO TBEC
40.0000 mg | DELAYED_RELEASE_TABLET | Freq: Every day | ORAL | Status: DC
  Administered 2019-08-04 – 2019-08-08 (×5): 40 mg via ORAL
  Filled 2019-08-04 (×5): qty 1

## 2019-08-04 MED ORDER — PAROXETINE HCL 20 MG PO TABS
40.0000 mg | ORAL_TABLET | Freq: Every day | ORAL | Status: DC
  Administered 2019-08-05 – 2019-08-09 (×5): 40 mg via ORAL
  Filled 2019-08-04 (×5): qty 2

## 2019-08-04 MED ORDER — IOHEXOL 350 MG/ML SOLN
100.0000 mL | Freq: Once | INTRAVENOUS | Status: AC | PRN
  Administered 2019-08-04: 80 mL via INTRAVENOUS

## 2019-08-04 NOTE — ED Notes (Signed)
Patient assisted to bedside commode.

## 2019-08-04 NOTE — ED Notes (Signed)
**Note Judy-Identified via Obfuscation** RN went to check on the patient due to monitor alarming, sats were in the 70's with a good pleth, Judy Meza at 4L attempted with no real  increase in sats.  ED provider and Dr. Olevia Bowens notified.,Non-re-breather applied.  Dr. Jeanette Caprice at the bedside at this time.  RT notified for Bipap.

## 2019-08-04 NOTE — ED Notes (Signed)
Patient is resting comfortably. 

## 2019-08-04 NOTE — Progress Notes (Signed)
RT checked patient and stoma is clean and patient is in no distress at this time.

## 2019-08-04 NOTE — ED Notes (Signed)
Secretary paging provider regarding diet for patient as patient is requesting food.

## 2019-08-04 NOTE — ED Notes (Signed)
Assumed care of patient at this time, nad noted, sr up x2, bed locked and low, call bell w/I reach.  Will continue to monitor. ° °

## 2019-08-04 NOTE — ED Notes (Signed)
Patient given popscicle.

## 2019-08-04 NOTE — ED Notes (Signed)
Sister at bedside.

## 2019-08-04 NOTE — Progress Notes (Signed)
Trach site cleansed and new dressing placed per pt request. Small amount secretions noted.

## 2019-08-04 NOTE — Progress Notes (Signed)
Called to place patient on BiPAP, however, upon arrival patient SATs 97% on NRB, audible gurgling noise heard. Patient with stoma with dressing over it. BiPAP contraindicated. However, after big cough of copious, thick, tanish/clear secreions from patients airway, patients SATs increased to 100%. Stoma cleaned and redressed top half to allow for airway clearance. Patient placed back on 4 Lpm nasal cannula, SATs remained 100%. RN at bedside. Will continue to monitor.

## 2019-08-04 NOTE — Progress Notes (Signed)
[facility]  PROGRESS NOTE  [name] WSF:[id number] DOB: Apr 07, 1960 DOA: 08/03/2019 PCP: Vevelyn Francois, NP Admit date - 08/03/2019   Admitting Physician Reubin Milan, MD  Outpatient Primary MD for the patient is Vevelyn Francois, NP  LOS - 0 Brief Narrative   Judy Meza is a 59 y.o. year old female with medical history significant for hepatitis C cirrhosis, dementia, tobacco use, prior history of alcohol abuse, COPD, pancytopenia, CKD stage III who presented on 08/03/2019 with 2 to 3 weeks of recurrent falls, weakness in legs, intermittent confusion, poor adherence to medications, low back pain.  Patient was evaluated at her PCPs office on 08/03/2019.  At that time her sister reported upwards of 6 falls in the past 2 weeks.  Patient was recently discharged from skilled nursing facility after a 30-day stay earlier in the month of July.  Patient lives at home by herself.  Sisters assist with medications, and meals and are concerned and was found to havepatient has not been taking her lactulose amongst other medications .  In conversation with her PCP at the time patient requiring assistance with basic ADLs.  At the time of evaluation patient was found to be quite lethargic concerning for failure to thrive versus moderate acute process and was sent to the ER for further evaluation.  Of note this is her third admission in the past 2 months  In the ED patient was afebrile, Placed on 2 L for SPO2 of 90%, blood pressure 163/121.  72, WBC 3.2, hemoglobin 9.4, platelets 66.  CO2 20, creatinine 1.31, AST 46, T bili 1.9.  UA unremarkable.  UDS unremarkable.  Lipase 75. Covid test negative. Chest x-ray nonacute, CT head with generalized cerebral atrophy with no acute changes. X-ray of thoracolumbar spine with no evidence of acute fracture. CTA chest negative for PE, right lower lobe atelectasis, small amount of free peritoneal fluid.  [facility] was called for further  management of potential failure to thrive in setting of hepatic encephalopathy.  Patient was started on lactulose therapy, resumed her home medications    Subjective  Today still has slowed thinking/processing also looks toward sister as she is limited in her ability to give history.  Denies any acute pain.  No shortness of breath. A & P   Hypoactive delirium.  Has intermittent slowed thinking, confusion over the past 2 to 3 weeks per family.  On exam still has intermittent confusion from baseline.  At baseline does have dementia without behavioral disturbances and cerebral atrophy on CT head with nonacute changes.  TSH within normal limits, infectious work-up unremarkable to date.  Differential includes decompensated hep C cirrhosis (hepatic encephalopathy/possible SBP--- given worsening ascites), B12 deficiency, polypharmacy, failure to thrive -Lactulose therapy -Check B12 -Avoid sedatives, hold home Ativan, hydroxyzine -Delirium precautions -IR paracentesis (diagnostic/therapeutic)  Increased weakness, recurrent falls at home.  Differential includes side effect of decompensated hep C cirrhosis (hepatic encephalopathy, possible SBP), B12 deficiency, polypharmacy, orthostatic hypotension.  Blood pressure within normal limits currently -Orthostatic vitals, if positive will DC home diuretics -Discontinue Questran given high risk for drowsiness/fatigue that could be contributing -Holding home Ativan -PT eval  Acute hepatic encephalopathy.  In setting of poor adherence to lactulose therapy.  Patient inconsistently takes lactulose at home.  Unclear if intermittent confusion is being driven primarily by this or if there is some other etiology causing confusion which leads to her poor adherence. -Lactulose 20 g 3 times daily, home rifaximin -monitor mental status  Hep  C cirrhosis, currently decompensated.  Admitted with hepatic encephalopathy. -Need to rule out SBP, IR paracentesis -Continue  home Lasix/Aldactone -Continue home Epclusa  Dementia with mood disturbances baseline 2-3 weeks ago alert and oriented x4, and able to perform all ADLs on her own. -Continue Paxil -Continue Namenda  History of alcohol use Patient lives alone, seizures, by occasionally. -Monitor on CIWA protocol for withdrawal-continue thiamine  GERD, stable -Continue PPI  CKD stage III, stable Creatinine at baseline -Avoid nephrotoxins, monitor BMP  Metabolic acidosis, chronic.  Pancytopenia in setting of hep C cirrhosis and CKD, chronic stable No signs or symptoms of bleeding currently. -Monitor CBC SCDs  COPD with chronic bronchitis.  No hypoxia, currently on 2 L. -Wean O2, goal SPO2 greater than 80%  Tobacco abuse, persists.  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  Evaluated by ENT earlier this month with instructions to quit smoking, and expand expectantly manage.  Followed by Dr. Elson Areas (oncology) dressing in place, no purulent drainage, CT chest unremarkable -Monitor      Family Communication  : Sister is updated at bedside  Code Status : DNR  Disposition Plan  :  Patient is from home. Anticipated d/c date:  1 to 2 days. Barriers to d/c or necessity for inpatient status:  Still having intermittent confusion from hepatic encephalopathy complicated by dementia, also working up weakness to ensure no acute etiologies, patient is at risk living by herself, and family unable to provide 24-hour assistance Consults  : None  Procedures  : Pending IR paracentesis  DVT Prophylaxis  : SCDs  Lab Results  Component Value Date   PLT 66 (L) 08/04/2019    Diet :  Diet Order            Diet regular Room service appropriate? Yes; Fluid consistency: Thin  Diet effective now                  Inpatient Medications Scheduled Meds: . cholestyramine light  4 g Oral Q24H  . furosemide   20 mg Oral Daily  . gabapentin  200 mg Oral TID  . lactulose  20 g Oral TID  . loratadine  10 mg Oral Daily  . LORazepam  0.5 mg Oral QHS  . memantine  5 mg Oral BID  . pantoprazole  40 mg Oral Daily  . PARoxetine  40 mg Oral BH-q7a  . rifaximin  550 mg Oral BID  . Sofosbuvir-Velpatasvir  1 tablet Oral Daily  . spironolactone  100 mg Oral Daily  . thiamine  100 mg Oral Daily   Continuous Infusions: PRN Meds:.albuterol, hydrOXYzine  Antibiotics  :   Anti-infectives (From admission, onward)   Start     Dose/Rate Route Frequency Ordered Stop   08/04/19 1000  rifaximin (XIFAXAN) tablet 550 mg     Discontinue     550 mg Oral 2 times daily 08/03/19 2350     08/04/19 1000  Sofosbuvir-Velpatasvir 400-100 MG TABS 1 tablet     Discontinue     1 tablet Oral Daily 08/03/19 2350         Objective   Vitals:   08/04/19 0913 08/04/19 1001 08/04/19 1053 08/04/19 1230  BP: (!) 147/94 123/84 (!) 146/90 (!) 134/92  Pulse: 88 89 85 81  Resp: 16 19 16 19   Temp:   97.8 F (36.6 C)   TempSrc:   Oral   SpO2: 94% 95%  100% 96%  Weight:      Height:        SpO2: 96 % O2 Flow Rate (L/min): 2 L/min  Wt Readings from Last 3 Encounters:  08/04/19 58.8 kg  08/03/19 58.8 kg  07/28/19 57.8 kg     Intake/Output Summary (Last 24 hours) at 08/04/2019 1313 Last data filed at 08/04/2019 3354 Gross per 24 hour  Intake 1 ml  Output --  Net 1 ml    Physical Exam:     Awake Alert, oriented to self place, context, not to time Follows commands [place].AT, Normal respiratory effort on 2 L, diminished breath sounds at bases RRR,No Gallops,Rubs or new Murmurs,  Abd Soft and diffusely distended, diminished bowel sounds. Scattered bruises on lower extremities and upper extremities, no new rash   I have personally reviewed the following:   Data Reviewed:  CBC Recent Labs  Lab 08/03/19 1752 08/04/19 0453  WBC 2.2* 3.2*  HGB 10.0* 9.4*  HCT 31.5* 28.9*  PLT 63* 66*  MCV 97.8 96.3  MCH 31.1  31.3  MCHC 31.7 32.5  RDW 16.1* 16.3*  LYMPHSABS 0.8  --   MONOABS 0.3  --   EOSABS 0.0  --   BASOSABS 0.0  --     Chemistries  Recent Labs  Lab 08/03/19 1752 08/04/19 0453  NA 136 135  K 4.6 4.2  CL 106 107  CO2 21* 20*  GLUCOSE 93 77  BUN 15 14  CREATININE 1.31* 1.21*  CALCIUM 9.7 9.2  AST 56* 46*  ALT 38 32  ALKPHOS 54 49  BILITOT 1.9* 1.9*   ------------------------------------------------------------------------------------------------------------------ No results for input(s): CHOL, HDL, LDLCALC, TRIG, CHOLHDL, LDLDIRECT in the last 72 hours.  Lab Results  Component Value Date   HGBA1C 5.1 06/01/2019   ------------------------------------------------------------------------------------------------------------------ Recent Labs    08/03/19 1752  TSH 3.803   ------------------------------------------------------------------------------------------------------------------ No results for input(s): VITAMINB12, FOLATE, FERRITIN, TIBC, IRON, RETICCTPCT in the last 72 hours.  Coagulation profile No results for input(s): INR, PROTIME in the last 168 hours.  No results for input(s): DDIMER in the last 72 hours.  Cardiac Enzymes No results for input(s): CKMB, TROPONINI, MYOGLOBIN in the last 168 hours.  Invalid input(s): CK ------------------------------------------------------------------------------------------------------------------    Component Value Date/Time   BNP 21.4 04/30/2019 0553    Micro Results Recent Results (from the past 240 hour(s))  SARS Coronavirus 2 by RT PCR (hospital order, performed in [facility] hospital lab) Nasopharyngeal Nasopharyngeal Swab     Status: None   Collection Time: 08/03/19  5:52 PM   Specimen: Nasopharyngeal Swab  Result Value Ref Range Status   SARS Coronavirus 2 NEGATIVE NEGATIVE Final    Comment: (NOTE) SARS-CoV-2 target nucleic acids are NOT DETECTED.  The SARS-CoV-2 RNA is generally detectable in upper and  lower respiratory specimens during the acute phase of infection. The lowest concentration of SARS-CoV-2 viral copies this assay can detect is 250 copies / mL. A negative result does not preclude SARS-CoV-2 infection and should not be used as the sole basis for treatment or other patient management decisions.  A negative result may occur with improper specimen collection / handling, submission of specimen other than nasopharyngeal swab, presence of viral mutation(s) within the areas targeted by this assay, and inadequate number of viral copies (<250 copies / mL). A negative result must be combined with clinical observations, patient history, and epidemiological information.  Fact Sheet for Patients:   StrictlyIdeas.no  Fact Sheet for Healthcare Providers: BankingDealers.co.za  This  test is not yet approved or  cleared by the [place] and has been authorized for detection and/or diagnosis of SARS-CoV-2 by FDA under an Emergency Use Authorization (EUA).  This EUA will remain in effect (meaning this test can be used) for the duration of the COVID-19 declaration under Section 564(b)(1) of the Act, 21 U.S.C. section 360bbb-3(b)(1), unless the authorization is terminated or revoked sooner.  Performed at [facility], [place] [address], [place], [place] [address]     Radiology Reports DG THORACOLUMABAR SPINE  Result Date: 08/03/2019 CLINICAL DATA:  Multiple falls. EXAM: THORACOLUMBAR SPINE 1V COMPARISON:  None. FINDINGS: It should be noted that the T1 through T4 vertebral bodies are limited in evaluation secondary to image caught off. There is no evidence of acute fracture within the visualized portion of the thoracolumbar spine. There is normal alignment. Normal multilevel endplates and normal multilevel intervertebral disc spaces are seen. Mild calcification of the abdominal aorta is seen. IMPRESSION: Limited study, as  described above, without evidence of acute fracture within the visualized portion of the thoracolumbar spine. Electronically Signed   By: Virgina Norfolk M.D.   On: 08/03/2019 22:09   CT Head Wo Contrast  Result Date: 08/03/2019 CLINICAL DATA:  Altered mental status with bilateral leg weakness. EXAM: CT HEAD WITHOUT CONTRAST TECHNIQUE: Contiguous axial images were obtained from the base of the skull through the vertex without intravenous contrast. COMPARISON:  None. FINDINGS: Brain: There is mild cerebral atrophy with widening of the extra-axial spaces and ventricular dilatation. There are areas of decreased attenuation within the white matter tracts of the supratentorial brain, consistent with microvascular disease changes. Vascular: No hyperdense vessel or unexpected calcification. Skull: Normal. Negative for fracture or focal lesion. Sinuses/Orbits: No acute finding. Other: None. IMPRESSION: 1. Generalized cerebral atrophy. 2. No acute intracranial abnormality. Electronically Signed   By: Virgina Norfolk M.D.   On: 08/03/2019 19:28   CT ANGIO CHEST PE W OR WO CONTRAST  Result Date: 08/04/2019 CLINICAL DATA:  Cough. Episode of hypoxia this morning, improved after coughing up thick secretions. Negative D-dimer. EXAM: CT ANGIOGRAPHY CHEST WITH CONTRAST TECHNIQUE: Multidetector CT imaging of the chest was performed using the standard protocol during bolus administration of intravenous contrast. Multiplanar CT image reconstructions and MIPs were obtained to evaluate the vascular anatomy. CONTRAST:  37mL OMNIPAQUE IOHEXOL 350 MG/ML SOLN COMPARISON:  Portable chest dated 08/03/2019 FINDINGS: Cardiovascular: Atheromatous calcifications, including the coronary arteries and aorta. Normally opacified pulmonary arteries with no pulmonary arterial filling defects seen. Mediastinum/Nodes: No enlarged mediastinal, hilar, or axillary lymph nodes. Thyroid gland, trachea, and esophagus demonstrate no significant  findings. Lungs/Pleura: Right lower lobe atelectasis.  No pleural fluid. Upper Abdomen: Small amount of free peritoneal fluid. Musculoskeletal: Mild thoracic spine degenerative changes. Review of the MIP images confirms the above findings. IMPRESSION: 1. No pulmonary emboli. 2. Right lower lobe atelectasis. 3. Small amount of free peritoneal fluid. 4.  Calcific coronary artery and aortic atherosclerosis. Aortic Atherosclerosis (ICD10-I70.0). Electronically Signed   By: Claudie Revering M.D.   On: 08/04/2019 08:33   DG Chest Portable 1 View  Result Date: 08/03/2019 CLINICAL DATA:  Found on the floor. EXAM: PORTABLE CHEST 1 VIEW COMPARISON:  Jun 04, 2019 FINDINGS: Overlying cardiac lead wires are seen. There is no evidence of acute infiltrate, pleural effusion or pneumothorax. The heart size and mediastinal contours are within normal limits. The visualized skeletal structures are unremarkable. IMPRESSION: No active disease. Electronically Signed   By: Joyce Gross.D.  On: 08/03/2019 18:36     Time Spent in minutes  30     Desiree Hane M.D on 08/04/2019 at 1:13 PM  To page go to [email or web address] - password [email or web address]

## 2019-08-04 NOTE — ED Notes (Signed)
Oxygen level now 98%, RT at the bedside.

## 2019-08-04 NOTE — ED Notes (Signed)
Patient has coughed up a "mucous plug",sats returning to normal, stoma has been suctioned and cleaned.  New dressing has been applied.  Will continue to monitor.

## 2019-08-05 DIAGNOSIS — M545 Low back pain: Secondary | ICD-10-CM | POA: Diagnosis not present

## 2019-08-05 DIAGNOSIS — Y92009 Unspecified place in unspecified non-institutional (private) residence as the place of occurrence of the external cause: Secondary | ICD-10-CM

## 2019-08-05 DIAGNOSIS — W19XXXS Unspecified fall, sequela: Secondary | ICD-10-CM

## 2019-08-05 DIAGNOSIS — K7030 Alcoholic cirrhosis of liver without ascites: Secondary | ICD-10-CM

## 2019-08-05 DIAGNOSIS — G8929 Other chronic pain: Secondary | ICD-10-CM

## 2019-08-05 DIAGNOSIS — N1831 Chronic kidney disease, stage 3a: Secondary | ICD-10-CM | POA: Diagnosis not present

## 2019-08-05 LAB — COMPREHENSIVE METABOLIC PANEL
ALT: 32 U/L (ref 0–44)
AST: 46 U/L — ABNORMAL HIGH (ref 15–41)
Albumin: 2.8 g/dL — ABNORMAL LOW (ref 3.5–5.0)
Alkaline Phosphatase: 54 U/L (ref 38–126)
Anion gap: 15 (ref 5–15)
BUN: 15 mg/dL (ref 6–20)
CO2: 17 mmol/L — ABNORMAL LOW (ref 22–32)
Calcium: 9.6 mg/dL (ref 8.9–10.3)
Chloride: 107 mmol/L (ref 98–111)
Creatinine, Ser: 1.4 mg/dL — ABNORMAL HIGH (ref 0.44–1.00)
GFR calc Af Amer: 48 mL/min — ABNORMAL LOW (ref >60)
GFR calc non Af Amer: 42 mL/min — ABNORMAL LOW (ref >60)
Glucose, Bld: 123 mg/dL — ABNORMAL HIGH (ref 70–99)
Potassium: 4.5 mmol/L (ref 3.5–5.1)
Sodium: 139 mmol/L (ref 135–145)
Total Bilirubin: 1.2 mg/dL (ref 0.3–1.2)
Total Protein: 6.7 g/dL (ref 6.5–8.1)

## 2019-08-05 LAB — CBC WITH DIFFERENTIAL/PLATELET
Abs Immature Granulocytes: 0.01 10*3/uL (ref 0.00–0.07)
Basophils Absolute: 0 10*3/uL (ref 0.0–0.1)
Basophils Relative: 0 %
Eosinophils Absolute: 0 10*3/uL (ref 0.0–0.5)
Eosinophils Relative: 1 %
HCT: 27.9 % — ABNORMAL LOW (ref 36.0–46.0)
Hemoglobin: 9.1 g/dL — ABNORMAL LOW (ref 12.0–15.0)
Immature Granulocytes: 0 %
Lymphocytes Relative: 22 %
Lymphs Abs: 0.9 10*3/uL (ref 0.7–4.0)
MCH: 31.6 pg (ref 26.0–34.0)
MCHC: 32.6 g/dL (ref 30.0–36.0)
MCV: 96.9 fL (ref 80.0–100.0)
Monocytes Absolute: 0.5 10*3/uL (ref 0.1–1.0)
Monocytes Relative: 13 %
Neutro Abs: 2.6 10*3/uL (ref 1.7–7.7)
Neutrophils Relative %: 64 %
Platelets: 71 10*3/uL — ABNORMAL LOW (ref 150–400)
RBC: 2.88 MIL/uL — ABNORMAL LOW (ref 3.87–5.11)
RDW: 16.5 % — ABNORMAL HIGH (ref 11.5–15.5)
WBC: 4 10*3/uL (ref 4.0–10.5)
nRBC: 0 % (ref 0.0–0.2)

## 2019-08-05 LAB — VITAMIN B12: Vitamin B-12: 1243 pg/mL — ABNORMAL HIGH (ref 180–914)

## 2019-08-05 MED ORDER — POLYVINYL ALCOHOL 1.4 % OP SOLN
1.0000 [drp] | OPHTHALMIC | Status: DC | PRN
  Administered 2019-08-05: 1 [drp] via OPHTHALMIC
  Filled 2019-08-05: qty 15

## 2019-08-05 MED ORDER — TRAMADOL HCL 50 MG PO TABS
50.0000 mg | ORAL_TABLET | Freq: Four times a day (QID) | ORAL | Status: DC | PRN
  Administered 2019-08-05 – 2019-08-09 (×9): 50 mg via ORAL
  Filled 2019-08-05 (×10): qty 1

## 2019-08-05 NOTE — Evaluation (Signed)
Physical Therapy Evaluation Patient Details Name: Judy Meza MRN: [id number] DOB: [birth year] Today's Date: 08/05/2019   History of Present Illness  59 y.o. female with medical history significant of COPD, throat cancer status post tracheostomy, cirrhosis of liver, chronic kidney disease stage III, hypertension, alcohol abuse in remission, tobacco abuse, history of IVDU in remission, hepatitis C, depression, dementia.  Pt admitted for Acute hepatic encephalopathy, increased weakness, and recurrent falls at home  Clinical Impression  Pt admitted with above diagnosis. Pt currently with functional limitations due to the deficits listed below (see PT Problem List). Pt will benefit from skilled PT to increase their independence and safety with mobility to allow discharge to the venue listed below.   Pt admitted with recurrent falls at home and lives alone.  Pt would benefit from SNF upon d/c.       Follow Up Recommendations SNF;Supervision for mobility/OOB    Equipment Recommendations  None recommended by PT    Recommendations for Other Services       Precautions / Restrictions Precautions Precautions: Fall      Mobility  Bed Mobility               General bed mobility comments: sitting EOB on arrival  Transfers Overall transfer level: Needs assistance Equipment used: None Transfers: Sit to/from Stand Sit to Stand: Min guard         General transfer comment: min/guard for safety  Ambulation/Gait Ambulation/Gait assistance: Min assist Gait Distance (Feet): 240 Feet Assistive device: None Gait Pattern/deviations: Step-through pattern;Decreased stride length;Narrow base of support     General Gait Details: slow unsteady gait however pt declined using RW so min assist occasionally provided for steadying, SPo2 94% on room air  Stairs            Wheelchair Mobility    Modified Rankin (Stroke Patients Only)       Balance Overall balance assessment: Needs  assistance;History of Falls         Standing balance support: No upper extremity supported Standing balance-Leahy Scale: Fair                               Pertinent Vitals/Pain Pain Assessment: Faces Faces Pain Scale: Hurts little more Pain Location: bil ankles (swelling on admission however improved today per sister) Pain Descriptors / Indicators: Sore Pain Intervention(s): Repositioned;Monitored during session    Home Living   Living Arrangements: Alone Available Help at Discharge: Family Type of Home: Apartment Home Access: Elevator     Home Layout: One level Home Equipment: Environmental consultant - 2 wheels;Cane - single point      Prior Function Level of Independence: Needs assistance   Gait / Transfers Assistance Needed: has walker but does not use it.     Comments: pt reports her sisters assist her with IADLs, sister briefly in to drop off medication for RN and states that pt does live alone and feels pt needs more assist, should not go home alone     Hand Dominance        Extremity/Trunk Assessment        Lower Extremity Assessment Lower Extremity Assessment: Generalized weakness    Cervical / Trunk Assessment Cervical / Trunk Assessment: Normal  Communication   Communication: Tracheostomy  Cognition Arousal/Alertness: Awake/alert Behavior During Therapy: Flat affect Overall Cognitive Status: Impaired/Different from baseline Area of Impairment: Following commands;Safety/judgement;Problem solving  Following Commands: Follows one step commands consistently Safety/Judgement: Decreased awareness of safety;Decreased awareness of deficits   Problem Solving: Slow processing        General Comments      Exercises     Assessment/Plan    PT Assessment Patient needs continued PT services  PT Problem List Decreased strength;Decreased mobility;Decreased balance;Decreased knowledge of use of DME;Decreased activity  tolerance       PT Treatment Interventions DME instruction;Therapeutic exercise;Gait training;Balance training;Functional mobility training;Therapeutic activities    PT Goals (Current goals can be found in the Care Plan section)  Acute Rehab PT Goals PT Goal Formulation: With patient Time For Goal Achievement: 08/19/19 Potential to Achieve Goals: Good    Frequency Min 2X/week   Barriers to discharge        Co-evaluation               AM-PAC PT "6 Clicks" Mobility  Outcome Measure Help needed turning from your back to your side while in a flat bed without using bedrails?: A Little Help needed moving from lying on your back to sitting on the side of a flat bed without using bedrails?: A Little Help needed moving to and from a bed to a chair (including a wheelchair)?: A Little Help needed standing up from a chair using your arms (e.g., wheelchair or bedside chair)?: A Little Help needed to walk in hospital room?: A Little Help needed climbing 3-5 steps with a railing? : A Lot 6 Click Score: 17    End of Session Equipment Utilized During Treatment: Gait belt Activity Tolerance: Patient tolerated treatment well Patient left: in chair;with chair alarm set;with call bell/phone within reach   PT Visit Diagnosis: Other abnormalities of gait and mobility (R26.89);Repeated falls (R29.6)    Time: 1884-1660 PT Time Calculation (min) (ACUTE ONLY): 17 min   Charges:   PT Evaluation $PT Eval Low Complexity: 1 Low     Kati PT, DPT Acute Rehabilitation Services Pager: [phone] Office: [phone]  York Ram E 08/05/2019, 12:40 PM

## 2019-08-05 NOTE — Progress Notes (Signed)
   08/05/19 0014  Assess: MEWS Score  Temp 98 F (36.7 C)  BP 112/84  Pulse Rate 96  Resp 20  Level of Consciousness Alert  SpO2 97 %  O2 Device Room Air  Assess: MEWS Score  MEWS Temp 0  MEWS Systolic 0  MEWS Pulse 0  MEWS RR 0  MEWS LOC 0  MEWS Score 0  MEWS Score Color Green  Assess: if the MEWS score is Yellow or Red  Were vital signs taken at a resting state? Yes  Focused Assessment No change from prior assessment  MEWS guidelines implemented *See Row Information* No, vital signs rechecked  Notify: Charge Nurse/RN  Name of Charge Nurse/RN Notified Pam,RN  Date Charge Nurse/RN Notified 08/05/19  Time Charge Nurse/RN Notified 2345

## 2019-08-05 NOTE — Progress Notes (Deleted)
   08/05/19 0158  Assess: MEWS Score  Temp 98.3 F (36.8 C)  BP 117/77  Pulse Rate 93  Resp 16  SpO2 100 %  Assess: MEWS Score  MEWS Temp 0  MEWS Systolic 0  MEWS Pulse 0  MEWS RR 0  MEWS LOC 0  MEWS Score 0  MEWS Score Color Judy Meza

## 2019-08-05 NOTE — Progress Notes (Addendum)
[facility]  PROGRESS NOTE  Laquasha Groome ION:[id number] DOB: 1960/07/29 DOA: 08/03/2019 PCP: Vevelyn Francois, NP Admit date - 08/03/2019   Admitting Physician Desiree Hane, MD  Outpatient Primary MD for the patient is Vevelyn Francois, NP  LOS - 1 Brief Narrative   Judy Meza is a 59 y.o. year old female with medical history significant for hepatitis C cirrhosis, dementia, tobacco use, prior history of alcohol abuse, COPD, pancytopenia, CKD stage III who presented on 08/03/2019 with 2 to 3 weeks of recurrent falls, weakness in legs, intermittent confusion, poor adherence to medications, low back pain.  Patient was evaluated at her PCPs office on 08/03/2019.  At that time her sister reported upwards of 6 falls in the past 2 weeks.  Patient was recently discharged from skilled nursing facility after a 30-day stay earlier in the month of July.  Patient lives at home by herself.  Sisters assist with medications, and meals and are concerned and was found to havepatient has not been taking her lactulose amongst other medications .  In conversation with her PCP at the time patient requiring assistance with basic ADLs.  At the time of evaluation patient was found to be quite lethargic concerning for failure to thrive versus moderate acute process and was sent to the ER for further evaluation.  Of note this is her third admission in the past 2 months  In the ED patient was afebrile, Placed on 2 L for SPO2 of 90%, blood pressure 163/121.  72, WBC 3.2, hemoglobin 9.4, platelets 66.  CO2 20, creatinine 1.31, AST 46, T bili 1.9.  UA unremarkable.  UDS unremarkable.  Lipase 75. Covid test negative. Chest x-ray nonacute, CT head with generalized cerebral atrophy with no acute changes. X-ray of thoracolumbar spine with no evidence of acute fracture. CTA chest negative for PE, right lower lobe atelectasis, small amount of free peritoneal fluid.  [facility] service was called for further management  of potential failure to thrive in setting of hepatic encephalopathy.  Patient was started on lactulose therapy, resumed her home medications    Subjective  Feels good.  Walked with physical therapy.  No cough.  No abdominal pain.  Still has low back pain A & P   Hypoactive delirium, improved.  Actively undergoing diabetic encephalopathy given improvement now that she is back on lactulose with no acute changes found on work-up mentioned previously in H&P.  Has intermittent slowed thinking, confusion over the past 2 to 3 weeks per family.  On exam alert and oriented to self, place, time.  At baseline does have dementia without behavioral disturbances and cerebral atrophy on CT head with nonacute changes.  TSH and B12 within normal limits, infectious work-up unremarkable to date.   -Lactulose therapy -Avoid sedatives, hold home Ativan, hydroxyzine -Delirium precautions   Increased weakness, recurrent falls at home.  Differential includes side effect of decompensated hep C cirrhosis (hepatic encephalopathy, possible SBP), B12 deficiency, polypharmacy, orthostatic hypotension.  Blood pressure within normal limits currently. Orthostaticvitals within normal limits, B12 normal, i -Discontinued Questran given high risk for drowsiness/fatigue that could be contributing -Holding home Ativan -PT eval recommends SNF  Acute hepatic encephalopathy improving.  In setting of poor adherence to lactulose therapy.  Patient inconsistently takes lactulose at home.   -Lactulose 20 g 3 times daily, home rifaximin, monitor bowel movements, goal 3 BMs daily -monitor mental status  Hep C cirrhosis, currently decompensated.  Admitted with hepatic encephalopathy.  Only minimal ascites on right upper  quadrant ultrasound -Continue home Lasix/Aldactone -Continue home Epclusa  Dementia with mood disturbances baseline 2-3 weeks ago alert and oriented x4 per family, and able to perform all ADLs on her own.  Now no longer  to do so safely -Continue Paxil -Continue Namenda -PT recommends SNF  Chronic low back pain, stable.  Slightly worsened in the setting of recent fall.  X-ray of spine showed no acute fracture.  No lower extremity weakness, no appreciable focal deficits on exam -Continue tramadol every 6 hours as needed  History of alcohol use. -Monitor on CIWA protocol for withdrawal -continue thiamine  GERD, stable -Continue PPI  CKD stage III, stable Creatinine at baseline -Avoid nephrotoxins, monitor BMP  Metabolic acidosis, chronic.  Pancytopenia in setting of hep C cirrhosis and CKD, chronic stable No signs or symptoms of bleeding currently. -Monitor CBC SCDs  COPD with chronic bronchitis.  No hypoxia, currently on 2 L. -Wean O2, goal SPO2 greater than 80%  Tobacco abuse, persists.  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  Evaluated by ENT earlier this month with instructions to quit smoking, and expand expectantly manage.  Followed by Dr. Elson Areas (oncology) dressing in place, no purulent drainage, CT chest unremarkable -Monitor      Family Communication  : Sister is updated at bedside on 7/23  Code Status : DNR  Disposition Plan  :  Patient is from home. Anticipated d/c date:  1 to 2 days. Barriers to d/c or necessity for inpatient status:  Confusion essentially resolved, has no acute abnormalities, continue to monitor mental status on lactulose therapy ensure appropriate bowel regimen, PT evaluated and recommend SNF as patient is at risk living by herself, and family unable to provide 24-hour assistance Consults  : None  Procedures  : None  DVT Prophylaxis  : SCDs  Lab Results  Component Value Date   PLT 71 (L) 08/05/2019    Diet :  Diet Order            Diet regular Room service appropriate? Yes; Fluid consistency: Thin  Diet effective now                   Inpatient Medications Scheduled Meds: . furosemide  20 mg Oral Daily  . lactulose  20 g Oral TID  . loratadine  10 mg Oral Daily  . LORazepam  0.5 mg Oral QHS  . memantine  5 mg Oral BID  . pantoprazole  40 mg Oral Daily  . PARoxetine  40 mg Oral Daily  . rifaximin  550 mg Oral BID  . Sofosbuvir-Velpatasvir  1 tablet Oral Daily  . spironolactone  100 mg Oral Daily  . thiamine  100 mg Oral Daily   Continuous Infusions: PRN Meds:.albuterol, hydrOXYzine, polyvinyl alcohol, traMADol  Antibiotics  :   Anti-infectives (From admission, onward)   Start     Dose/Rate Route Frequency Ordered Stop   08/04/19 1000  rifaximin (XIFAXAN) tablet 550 mg     Discontinue     550 mg Oral 2 times daily 08/03/19 2350     08/04/19 1000  Sofosbuvir-Velpatasvir 400-100 MG TABS 1 tablet     Discontinue     1 tablet Oral Daily 08/03/19 2350         Objective   Vitals:   08/05/19 0805 08/05/19 0820 08/05/19 0821 08/05/19 1202  BP: 118/75   (!) 130/92  Pulse: 97   91  Resp: 21   20  Temp: 98.1 F (36.7 C)   98.2 F (36.8 C)  TempSrc: Oral   Oral  SpO2: 98% 99% 99% 97%  Weight:      Height:        SpO2: 97 % O2 Flow Rate (L/min): 2 L/min  Wt Readings from Last 3 Encounters:  08/04/19 58.8 kg  08/03/19 58.8 kg  07/28/19 57.8 kg     Intake/Output Summary (Last 24 hours) at 08/05/2019 1620 Last data filed at 08/05/2019 1000 Gross per 24 hour  Intake 1066 ml  Output 525 ml  Net 541 ml    Physical Exam:     Awake Alert, oriented to self place, context, not to time Follows commands, able to move upper and lower extremities without any appreciable focal deficits [place].AT, Normal respiratory effort on room air, diminished breath sounds at bases RRR,No Gallops,Rubs or new Murmurs,  Abd Soft and slightly distended, diminished bowel sounds, nontender Scattered bruises on lower extremities and upper extremities, no new rash   I have personally reviewed the following:   Data  Reviewed:  CBC Recent Labs  Lab 08/03/19 1752 08/04/19 0453 08/05/19 0741  WBC 2.2* 3.2* 4.0  HGB 10.0* 9.4* 9.1*  HCT 31.5* 28.9* 27.9*  PLT 63* 66* 71*  MCV 97.8 96.3 96.9  MCH 31.1 31.3 31.6  MCHC 31.7 32.5 32.6  RDW 16.1* 16.3* 16.5*  LYMPHSABS 0.8  --  0.9  MONOABS 0.3  --  0.5  EOSABS 0.0  --  0.0  BASOSABS 0.0  --  0.0    Chemistries  Recent Labs  Lab 08/03/19 1752 08/04/19 0453 08/05/19 0741  NA 136 135 139  K 4.6 4.2 4.5  CL 106 107 107  CO2 21* 20* 17*  GLUCOSE 93 77 123*  BUN 15 14 15   CREATININE 1.31* 1.21* 1.40*  CALCIUM 9.7 9.2 9.6  AST 56* 46* 46*  ALT 38 32 32  ALKPHOS 54 49 54  BILITOT 1.9* 1.9* 1.2   ------------------------------------------------------------------------------------------------------------------ No results for input(s): CHOL, HDL, LDLCALC, TRIG, CHOLHDL, LDLDIRECT in the last 72 hours.  Lab Results  Component Value Date   HGBA1C 5.1 06/01/2019   ------------------------------------------------------------------------------------------------------------------ Recent Labs    08/03/19 1752  TSH 3.803   ------------------------------------------------------------------------------------------------------------------ Recent Labs    08/05/19 0529  VITAMINB12 1,243*    Coagulation profile No results for input(s): INR, PROTIME in the last 168 hours.  No results for input(s): DDIMER in the last 72 hours.  Cardiac Enzymes No results for input(s): CKMB, TROPONINI, MYOGLOBIN in the last 168 hours.  Invalid input(s): CK ------------------------------------------------------------------------------------------------------------------    Component Value Date/Time   BNP 21.4 04/30/2019 0553    Micro Results Recent Results (from the past 240 hour(s))  SARS Coronavirus 2 by RT PCR (hospital order, performed in [facility] hospital lab) Nasopharyngeal Nasopharyngeal Swab     Status: None   Collection Time: 08/03/19  5:52  PM   Specimen: Nasopharyngeal Swab  Result Value Ref Range Status   SARS Coronavirus 2 NEGATIVE NEGATIVE Final    Comment: (NOTE) SARS-CoV-2 target nucleic acids are NOT DETECTED.  The SARS-CoV-2 RNA is generally detectable in upper and lower respiratory specimens during the acute phase of infection. The lowest concentration of SARS-CoV-2 viral copies this assay can detect is 250 copies / mL. A negative result does not preclude SARS-CoV-2 infection and should not be used as the sole basis for treatment or other patient management decisions.  A negative result may  occur with improper specimen collection / handling, submission of specimen other than nasopharyngeal swab, presence of viral mutation(s) within the areas targeted by this assay, and inadequate number of viral copies (<250 copies / mL). A negative result must be combined with clinical observations, patient history, and epidemiological information.  Fact Sheet for Patients:   StrictlyIdeas.no  Fact Sheet for Healthcare Providers: BankingDealers.co.za  This test is not yet approved or  cleared by the [place] FDA and has been authorized for detection and/or diagnosis of SARS-CoV-2 by FDA under an Emergency Use Authorization (EUA).  This EUA will remain in effect (meaning this test can be used) for the duration of the COVID-19 declaration under Section 564(b)(1) of the Act, 21 U.S.C. section 360bbb-3(b)(1), unless the authorization is terminated or revoked sooner.  Performed at [facility], [place] [address] [place], [place] [address]     Radiology Reports DG THORACOLUMABAR SPINE  Result Date: 08/03/2019 CLINICAL DATA:  Multiple falls. EXAM: THORACOLUMBAR SPINE 1V COMPARISON:  None. FINDINGS: It should be noted that the T1 through T4 vertebral bodies are limited in evaluation secondary to image caught off. There is no evidence of acute fracture within  the visualized portion of the thoracolumbar spine. There is normal alignment. Normal multilevel endplates and normal multilevel intervertebral disc spaces are seen. Mild calcification of the abdominal aorta is seen. IMPRESSION: Limited study, as described above, without evidence of acute fracture within the visualized portion of the thoracolumbar spine. Electronically Signed   By: Virgina Norfolk M.D.   On: 08/03/2019 22:09   CT Head Wo Contrast  Result Date: 08/03/2019 CLINICAL DATA:  Altered mental status with bilateral leg weakness. EXAM: CT HEAD WITHOUT CONTRAST TECHNIQUE: Contiguous axial images were obtained from the base of the skull through the vertex without intravenous contrast. COMPARISON:  None. FINDINGS: Brain: There is mild cerebral atrophy with widening of the extra-axial spaces and ventricular dilatation. There are areas of decreased attenuation within the white matter tracts of the supratentorial brain, consistent with microvascular disease changes. Vascular: No hyperdense vessel or unexpected calcification. Skull: Normal. Negative for fracture or focal lesion. Sinuses/Orbits: No acute finding. Other: None. IMPRESSION: 1. Generalized cerebral atrophy. 2. No acute intracranial abnormality. Electronically Signed   By: Virgina Norfolk M.D.   On: 08/03/2019 19:28   CT ANGIO CHEST PE W OR WO CONTRAST  Result Date: 08/04/2019 CLINICAL DATA:  Cough. Episode of hypoxia this morning, improved after coughing up thick secretions. Negative D-dimer. EXAM: CT ANGIOGRAPHY CHEST WITH CONTRAST TECHNIQUE: Multidetector CT imaging of the chest was performed using the standard protocol during bolus administration of intravenous contrast. Multiplanar CT image reconstructions and MIPs were obtained to evaluate the vascular anatomy. CONTRAST:  43mL OMNIPAQUE IOHEXOL 350 MG/ML SOLN COMPARISON:  Portable chest dated 08/03/2019 FINDINGS: Cardiovascular: Atheromatous calcifications, including the coronary arteries  and aorta. Normally opacified pulmonary arteries with no pulmonary arterial filling defects seen. Mediastinum/Nodes: No enlarged mediastinal, hilar, or axillary lymph nodes. Thyroid gland, trachea, and esophagus demonstrate no significant findings. Lungs/Pleura: Right lower lobe atelectasis.  No pleural fluid. Upper Abdomen: Small amount of free peritoneal fluid. Musculoskeletal: Mild thoracic spine degenerative changes. Review of the MIP images confirms the above findings. IMPRESSION: 1. No pulmonary emboli. 2. Right lower lobe atelectasis. 3. Small amount of free peritoneal fluid. 4.  Calcific coronary artery and aortic atherosclerosis. Aortic Atherosclerosis (ICD10-I70.0). Electronically Signed   By: Claudie Revering M.D.   On: 08/04/2019 08:33   DG Chest Portable 1 View  Result  Date: 08/03/2019 CLINICAL DATA:  Found on the floor. EXAM: PORTABLE CHEST 1 VIEW COMPARISON:  Jun 04, 2019 FINDINGS: Overlying cardiac lead wires are seen. There is no evidence of acute infiltrate, pleural effusion or pneumothorax. The heart size and mediastinal contours are within normal limits. The visualized skeletal structures are unremarkable. IMPRESSION: No active disease. Electronically Signed   By: Virgina Norfolk M.D.   On: 08/03/2019 18:36   [place] ASCITES (ABDOMEN LIMITED)  Result Date: 08/04/2019 CLINICAL DATA:  Ascites EXAM: LIMITED ABDOMEN ULTRASOUND FOR ASCITES TECHNIQUE: Limited ultrasound survey for ascites was performed in all four abdominal quadrants. COMPARISON:  CT 08/04/2019 FINDINGS: Sonography of the 4 quadrants is performed. Cirrhotic morphology of the liver. Small amount of ascites adjacent to the liver. No significant fluid in the lower quadrants. IMPRESSION: 1. Only small amount of ascites in the right upper quadrant adjacent to the liver 2. Cirrhotic morphology of the liver Electronically Signed   By: Donavan Foil M.D.   On: 08/04/2019 17:53     Time Spent in minutes  30     Desiree Hane M.D on  08/05/2019 at 4:20 PM  To page go to [email or web address] - password [facility]

## 2019-08-06 DIAGNOSIS — Z8521 Personal history of malignant neoplasm of larynx: Secondary | ICD-10-CM

## 2019-08-06 DIAGNOSIS — K7460 Unspecified cirrhosis of liver: Secondary | ICD-10-CM | POA: Diagnosis not present

## 2019-08-06 DIAGNOSIS — M546 Pain in thoracic spine: Secondary | ICD-10-CM | POA: Diagnosis not present

## 2019-08-06 DIAGNOSIS — K7031 Alcoholic cirrhosis of liver with ascites: Secondary | ICD-10-CM | POA: Diagnosis not present

## 2019-08-06 DIAGNOSIS — N1831 Chronic kidney disease, stage 3a: Secondary | ICD-10-CM | POA: Diagnosis not present

## 2019-08-06 MED ORDER — SODIUM BICARBONATE 650 MG PO TABS
650.0000 mg | ORAL_TABLET | Freq: Two times a day (BID) | ORAL | Status: DC
  Administered 2019-08-06 – 2019-08-09 (×7): 650 mg via ORAL
  Filled 2019-08-06 (×7): qty 1

## 2019-08-06 NOTE — Progress Notes (Signed)
Assumed care of patient from previous nurse. Agree with previous nurses assessment. Will continue to monitor.  

## 2019-08-06 NOTE — Progress Notes (Signed)
[facility]  PROGRESS NOTE  Judy Meza ZJQ:[id number] DOB: 08/31/1960 DOA: 08/03/2019 PCP: Vevelyn Francois, NP Admit date - 08/03/2019   Admitting Physician Desiree Hane, MD  Outpatient Primary MD for the patient is Vevelyn Francois, NP  LOS - 2 Brief Narrative   Judy Meza is a 60 y.o. year old female with medical history significant for hepatitis C cirrhosis, dementia, tobacco use, prior history of alcohol abuse, COPD, pancytopenia, CKD stage III who presented on 08/03/2019 with 2 to 3 weeks of recurrent falls, weakness in legs, intermittent confusion, poor adherence to medications, low back pain.  Patient was evaluated at her PCPs office on 08/03/2019.  At that time her sister reported upwards of 6 falls in the past 2 weeks.  Patient was recently discharged from skilled nursing facility after a 30-day stay earlier in the month of July.  Patient lives at home by herself.  Sisters assist with medications, and meals and are concerned and was found to havepatient has not been taking her lactulose amongst other medications .  In conversation with her PCP at the time patient requiring assistance with basic ADLs.  At the time of evaluation patient was found to be quite lethargic concerning for failure to thrive versus moderate acute process and was sent to the ER for further evaluation.  Of note this is her third admission in the past 2 months  In the ED patient was afebrile, Placed on 2 L for SPO2 of 90%, blood pressure 163/121.  72, WBC 3.2, hemoglobin 9.4, platelets 66.  CO2 20, creatinine 1.31, AST 46, T bili 1.9.  UA unremarkable.  UDS unremarkable.  Lipase 75. Covid test negative. Chest x-ray nonacute, CT head with generalized cerebral atrophy with no acute changes. X-ray of thoracolumbar spine with no evidence of acute fracture. CTA chest negative for PE, right lower lobe atelectasis, small amount of free peritoneal fluid.  [facility] service was called for further management  of potential failure to thrive in setting of hepatic encephalopathy.  Patient was started on lactulose therapy, resumed her home medications    Subjective  Reports mid back pain has been ongoing for 2 weeks.  Otherwise has no acute needs A & P   Hypoactive delirium, resolved.  Actively undergoing diabetic encephalopathy given improvement now that she is back on lactulose with no acute changes found on work-up mentioned previously in H&P.  Has intermittent slowed thinking, confusion over the past 2 to 3 weeks per family.  On exam alert and oriented to self, place, time.  At baseline does have dementia without behavioral disturbances and cerebral atrophy on CT head with nonacute changes.  TSH and B12 within normal limits, infectious work-up unremarkable to date.   -Lactulose therapy continue at current regimen -Avoid sedatives, hold home Ativan, hydroxyzine -Delirium precautions   Increased weakness, recurrent falls at home.  Differential includes side effect of decompensated hep C cirrhosis (hepatic encephalopathy, possible SBP), B12 deficiency, polypharmacy, orthostatic hypotension.  Blood pressure within normal limits currently. Orthostatic vitals within normal limits, B12 normal, i -Discontinued Questran given high risk for drowsiness/fatigue that could be contributing -Holding home Ativan -PT eval recommends SNF  Acute hepatic encephalopathy, resolved.  In setting of poor adherence to lactulose therapy.  Patient inconsistently takes lactulose at home.  No current asterixis, mentation normal -Lactulose 20 g 3 times daily, home rifaximin, monitor bowel movements, goal 3 BMs daily -monitor mental status  Hep C cirrhosis, currently compensated.  Admitted with hepatic encephalopathy.  Only  minimal ascites on right upper quadrant ultrasound, mentation back to baseline -Continue home Lasix/Aldactone -Continue home Epclusa  Dementia with mood disturbances, stable baseline 2-3 weeks ago alert  and oriented x4 per family, and able to perform all ADLs on her own.  Now no longer to do so safely -Continue Paxil -Continue Namenda -PT recommends SNF  Chronic low back pain, stable.  Slightly worsened in the setting of recent fall.  X-ray of lumbar spine showed no acute fracture.  No lower extremity weakness, no appreciable focal deficits on exam, well controlled on current pain regimen -Continue tramadol every 6 hours as needed  History of alcohol use. -Monitor on CIWA protocol for withdrawal -continue thiamine  GERD, stable -Continue PPI  CKD stage III, stable Creatinine at baseline -Avoid nephrotoxins, monitor BMP  Metabolic acidosis, chronic.  Pancytopenia in setting of hep C cirrhosis and CKD, chronic stable No signs or symptoms of bleeding currently. -Monitor CBC SCDs  COPD with chronic bronchitis.  No hypoxia, currently on 2 L. -Wean O2, goal SPO2 greater than 80%  Tobacco abuse, persists.  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  Evaluated by ENT earlier this month with instructions to quit smoking, and expand expectantly manage.  Followed by Dr. Elson Areas (oncology) dressing in place, no purulent drainage, CT chest unremarkable -Monitor      Family Communication  : Sister updated at bedside on 7/23, will call for update on 7/25  Code Status : DNR  Disposition Plan  :  Patient is from home. Anticipated d/c date:  Medically stable. Barriers to d/c or necessity for inpatient status:  Confusion essentially resolved, has no acute abnormalities,  PT evaluated and recommend SNF as patient is at risk living by herself, and family unable to provide 24-hour assistance Consults  : None  Procedures  : None  DVT Prophylaxis  : SCDs  Lab Results  Component Value Date   PLT 71 (L) 08/05/2019    Diet :  Diet Order            Diet regular Room service appropriate? Yes;  Fluid consistency: Thin  Diet effective now                  Inpatient Medications Scheduled Meds: . furosemide  20 mg Oral Daily  . lactulose  20 g Oral TID  . loratadine  10 mg Oral Daily  . LORazepam  0.5 mg Oral QHS  . memantine  5 mg Oral BID  . pantoprazole  40 mg Oral Daily  . PARoxetine  40 mg Oral Daily  . rifaximin  550 mg Oral BID  . sodium bicarbonate  650 mg Oral BID  . Sofosbuvir-Velpatasvir  1 tablet Oral Daily  . spironolactone  100 mg Oral Daily  . thiamine  100 mg Oral Daily   Continuous Infusions: PRN Meds:.albuterol, hydrOXYzine, polyvinyl alcohol, traMADol  Antibiotics  :   Anti-infectives (From admission, onward)   Start     Dose/Rate Route Frequency Ordered Stop   08/04/19 1000  rifaximin (XIFAXAN) tablet 550 mg     Discontinue     550 mg Oral 2 times daily 08/03/19 2350     08/04/19 1000  Sofosbuvir-Velpatasvir 400-100 MG TABS 1 tablet     Discontinue     1 tablet Oral Daily 08/03/19 2350         Objective   Vitals:   08/06/19 0625 08/06/19  3818 08/06/19 0841 08/06/19 1209  BP: 99/80   116/79  Pulse: 91   91  Resp: 19   18  Temp: 99.4 F (37.4 C)   98.9 F (37.2 C)  TempSrc: Oral   Oral  SpO2: 100% 100% 100% 100%  Weight:      Height:        SpO2: 100 % O2 Flow Rate (L/min): 2 L/min  Wt Readings from Last 3 Encounters:  08/04/19 58.8 kg  08/03/19 58.8 kg  07/28/19 57.8 kg     Intake/Output Summary (Last 24 hours) at 08/06/2019 1732 Last data filed at 08/06/2019 1400 Gross per 24 hour  Intake 480 ml  Output --  Net 480 ml    Physical Exam:     Awake Alert, oriented to self place, context,  Follows commands, able to move upper and lower extremities without any appreciable focal deficits [email or web address], Normal respiratory effort on room air, diminished breath sounds at bases RRR,No Gallops,Rubs or new Murmurs,  Abd Soft and slightly distended, diminished bowel sounds, nontender Scattered bruises on lower extremities and upper  extremities, no new rash   I have personally reviewed the following:   Data Reviewed:  CBC Recent Labs  Lab 08/03/19 1752 08/04/19 0453 08/05/19 0741  WBC 2.2* 3.2* 4.0  HGB 10.0* 9.4* 9.1*  HCT 31.5* 28.9* 27.9*  PLT 63* 66* 71*  MCV 97.8 96.3 96.9  MCH 31.1 31.3 31.6  MCHC 31.7 32.5 32.6  RDW 16.1* 16.3* 16.5*  LYMPHSABS 0.8  --  0.9  MONOABS 0.3  --  0.5  EOSABS 0.0  --  0.0  BASOSABS 0.0  --  0.0    Chemistries  Recent Labs  Lab 08/03/19 1752 08/04/19 0453 08/05/19 0741  NA 136 135 139  K 4.6 4.2 4.5  CL 106 107 107  CO2 21* 20* 17*  GLUCOSE 93 77 123*  BUN 15 14 15   CREATININE 1.31* 1.21* 1.40*  CALCIUM 9.7 9.2 9.6  AST 56* 46* 46*  ALT 38 32 32  ALKPHOS 54 49 54  BILITOT 1.9* 1.9* 1.2   ------------------------------------------------------------------------------------------------------------------ No results for input(s): CHOL, HDL, LDLCALC, TRIG, CHOLHDL, LDLDIRECT in the last 72 hours.  Lab Results  Component Value Date   HGBA1C 5.1 06/01/2019   ------------------------------------------------------------------------------------------------------------------ Recent Labs    08/03/19 1752  TSH 3.803   ------------------------------------------------------------------------------------------------------------------ Recent Labs    08/05/19 0529  VITAMINB12 1,243*    Coagulation profile No results for input(s): INR, PROTIME in the last 168 hours.  No results for input(s): DDIMER in the last 72 hours.  Cardiac Enzymes No results for input(s): CKMB, TROPONINI, MYOGLOBIN in the last 168 hours.  Invalid input(s): CK ------------------------------------------------------------------------------------------------------------------    Component Value Date/Time   BNP 21.4 04/30/2019 0553    Micro Results Recent Results (from the past 240 hour(s))  SARS Coronavirus 2 by RT PCR (hospital order, performed in [facility] hospital lab)  Nasopharyngeal Nasopharyngeal Swab     Status: None   Collection Time: 08/03/19  5:52 PM   Specimen: Nasopharyngeal Swab  Result Value Ref Range Status   SARS Coronavirus 2 NEGATIVE NEGATIVE Final    Comment: (NOTE) SARS-CoV-2 target nucleic acids are NOT DETECTED.  The SARS-CoV-2 RNA is generally detectable in upper and lower respiratory specimens during the acute phase of infection. The lowest concentration of SARS-CoV-2 viral copies this assay can detect is 250 copies / mL. A negative result does not preclude SARS-CoV-2 infection and should not be used as  the sole basis for treatment or other patient management decisions.  A negative result may occur with improper specimen collection / handling, submission of specimen other than nasopharyngeal swab, presence of viral mutation(s) within the areas targeted by this assay, and inadequate number of viral copies (<250 copies / mL). A negative result must be combined with clinical observations, patient history, and epidemiological information.  Fact Sheet for Patients:   StrictlyIdeas.no  Fact Sheet for Healthcare Providers: BankingDealers.co.za  This test is not yet approved or  cleared by the [place] FDA and has been authorized for detection and/or diagnosis of SARS-CoV-2 by FDA under an Emergency Use Authorization (EUA).  This EUA will remain in effect (meaning this test can be used) for the duration of the COVID-19 declaration under Section 564(b)(1) of the Act, 21 U.S.C. section 360bbb-3(b)(1), unless the authorization is terminated or revoked sooner.  Performed at [facility] [place] [address], [place], [place] [address]     Radiology Reports DG THORACOLUMABAR SPINE  Result Date: 08/03/2019 CLINICAL DATA:  Multiple falls. EXAM: THORACOLUMBAR SPINE 1V COMPARISON:  None. FINDINGS: It should be noted that the T1 through T4 vertebral bodies are limited in  evaluation secondary to image caught off. There is no evidence of acute fracture within the visualized portion of the thoracolumbar spine. There is normal alignment. Normal multilevel endplates and normal multilevel intervertebral disc spaces are seen. Mild calcification of the abdominal aorta is seen. IMPRESSION: Limited study, as described above, without evidence of acute fracture within the visualized portion of the thoracolumbar spine. Electronically Signed   By: Virgina Norfolk M.D.   On: 08/03/2019 22:09   CT Head Wo Contrast  Result Date: 08/03/2019 CLINICAL DATA:  Altered mental status with bilateral leg weakness. EXAM: CT HEAD WITHOUT CONTRAST TECHNIQUE: Contiguous axial images were obtained from the base of the skull through the vertex without intravenous contrast. COMPARISON:  None. FINDINGS: Brain: There is mild cerebral atrophy with widening of the extra-axial spaces and ventricular dilatation. There are areas of decreased attenuation within the white matter tracts of the supratentorial brain, consistent with microvascular disease changes. Vascular: No hyperdense vessel or unexpected calcification. Skull: Normal. Negative for fracture or focal lesion. Sinuses/Orbits: No acute finding. Other: None. IMPRESSION: 1. Generalized cerebral atrophy. 2. No acute intracranial abnormality. Electronically Signed   By: Virgina Norfolk M.D.   On: 08/03/2019 19:28   CT ANGIO CHEST PE W OR WO CONTRAST  Result Date: 08/04/2019 CLINICAL DATA:  Cough. Episode of hypoxia this morning, improved after coughing up thick secretions. Negative D-dimer. EXAM: CT ANGIOGRAPHY CHEST WITH CONTRAST TECHNIQUE: Multidetector CT imaging of the chest was performed using the standard protocol during bolus administration of intravenous contrast. Multiplanar CT image reconstructions and MIPs were obtained to evaluate the vascular anatomy. CONTRAST:  16mL OMNIPAQUE IOHEXOL 350 MG/ML SOLN COMPARISON:  Portable chest dated  08/03/2019 FINDINGS: Cardiovascular: Atheromatous calcifications, including the coronary arteries and aorta. Normally opacified pulmonary arteries with no pulmonary arterial filling defects seen. Mediastinum/Nodes: No enlarged mediastinal, hilar, or axillary lymph nodes. Thyroid gland, trachea, and esophagus demonstrate no significant findings. Lungs/Pleura: Right lower lobe atelectasis.  No pleural fluid. Upper Abdomen: Small amount of free peritoneal fluid. Musculoskeletal: Mild thoracic spine degenerative changes. Review of the MIP images confirms the above findings. IMPRESSION: 1. No pulmonary emboli. 2. Right lower lobe atelectasis. 3. Small amount of free peritoneal fluid. 4.  Calcific coronary artery and aortic atherosclerosis. Aortic Atherosclerosis (ICD10-I70.0). Electronically Signed   By: Claudie Revering  M.D.   On: 08/04/2019 08:33   DG Chest Portable 1 View  Result Date: 08/03/2019 CLINICAL DATA:  Found on the floor. EXAM: PORTABLE CHEST 1 VIEW COMPARISON:  Jun 04, 2019 FINDINGS: Overlying cardiac lead wires are seen. There is no evidence of acute infiltrate, pleural effusion or pneumothorax. The heart size and mediastinal contours are within normal limits. The visualized skeletal structures are unremarkable. IMPRESSION: No active disease. Electronically Signed   By: Virgina Norfolk M.D.   On: 08/03/2019 18:36   [place] ASCITES (ABDOMEN LIMITED)  Result Date: 08/04/2019 CLINICAL DATA:  Ascites EXAM: LIMITED ABDOMEN ULTRASOUND FOR ASCITES TECHNIQUE: Limited ultrasound survey for ascites was performed in all four abdominal quadrants. COMPARISON:  CT 08/04/2019 FINDINGS: Sonography of the 4 quadrants is performed. Cirrhotic morphology of the liver. Small amount of ascites adjacent to the liver. No significant fluid in the lower quadrants. IMPRESSION: 1. Only small amount of ascites in the right upper quadrant adjacent to the liver 2. Cirrhotic morphology of the liver Electronically Signed   By: Donavan Foil M.D.   On: 08/04/2019 17:53     Time Spent in minutes  30     Desiree Hane M.D on 08/06/2019 at 5:32 PM  To page go to [email or web address] - password [facility]

## 2019-08-07 ENCOUNTER — Inpatient Hospital Stay (HOSPITAL_COMMUNITY): Payer: Medicaid Other

## 2019-08-07 DIAGNOSIS — M5410 Radiculopathy, site unspecified: Secondary | ICD-10-CM | POA: Diagnosis not present

## 2019-08-07 DIAGNOSIS — W19XXXS Unspecified fall, sequela: Secondary | ICD-10-CM | POA: Diagnosis not present

## 2019-08-07 DIAGNOSIS — K7030 Alcoholic cirrhosis of liver without ascites: Secondary | ICD-10-CM | POA: Diagnosis not present

## 2019-08-07 DIAGNOSIS — J449 Chronic obstructive pulmonary disease, unspecified: Secondary | ICD-10-CM | POA: Diagnosis not present

## 2019-08-07 MED ORDER — HYDROCERIN EX CREA
TOPICAL_CREAM | Freq: Two times a day (BID) | CUTANEOUS | Status: DC
  Administered 2019-08-08: 1 via TOPICAL
  Filled 2019-08-07: qty 113

## 2019-08-07 MED ORDER — OXYCODONE HCL 5 MG PO TABS
5.0000 mg | ORAL_TABLET | Freq: Four times a day (QID) | ORAL | Status: DC | PRN
  Administered 2019-08-07 – 2019-08-09 (×6): 5 mg via ORAL
  Filled 2019-08-07 (×6): qty 1

## 2019-08-07 NOTE — Progress Notes (Signed)
Fail attempt at MRI. Patient terminated the procedure due to pain and refused to continue. MRI of the thoracic and lumbar spine without gadolinium completed.

## 2019-08-07 NOTE — Progress Notes (Signed)
Policy reviewed with transporter regarding fall risk.  Pt has been documented as high fall risk (transport via bed).  Patient transported to radiology via wheelchair.

## 2019-08-07 NOTE — TOC Initial Note (Signed)
Transition of Care [facility]) - Initial/Assessment Note    Patient Details  Name: Judy Meza MRN: [id number] Date of Birth: Nov 23, 1960  Transition of Care [facility]) CM/SW Contact:    Joaquin Courts, RN Phone Number: 08/07/2019, 10:56 AM  Clinical Narrative:                 CM spoke with patient at bedside and sisters over the phone (with patient permission).  Per the sisters patient is scheduled [facility] August [facility]st.  Sisters and patient would like to go to one of the [facility] affiliated facilities (total of 4) that way patient can still start with [facility] while in SNF.  CM faxed FL2 to [facility], [place], [place], and [facility].  CM spoke with [facility] intake coordinator and notified her of this plan.  CM will await bed offers.   Expected Discharge Plan: Skilled Nursing Facility Barriers to Discharge: No Barriers Identified   Patient Goals and CMS Choice Patient states their goals for this hospitalization and ongoing recovery are:: to go to rehab CMS Medicare.gov Compare Post Acute Care list provided to:: Patient Choice offered to / list presented to : Patient, Sibling  Expected Discharge Plan and Services Expected Discharge Plan: [place]   Discharge Planning Services: CM Consult Post Acute Care Choice: [place] Living arrangements for the past 2 months: Apartment                 DME Arranged: N/A DME Agency: NA       HH Arranged: NA HH Agency: NA        Prior Living Arrangements/Services Living arrangements for the past 2 months: Apartment Lives with:: Self Patient language and need for interpreter reviewed:: Yes Do you feel safe going back to the place where you live?: Yes      Need for Family Participation in Patient Care: Yes (Comment) Care giver support system in place?: Yes (comment)   Criminal Activity/Legal Involvement Pertinent to Current Situation/Hospitalization: No - Comment  as needed  Activities of Daily Living Home Assistive Devices/Equipment: Walker (specify type) ADL Screening (condition at time of admission) Patient's cognitive ability adequate to safely complete daily activities?: No Is the patient deaf or have difficulty hearing?: No Does the patient have difficulty seeing, even when wearing glasses/contacts?: Yes Does the patient have difficulty concentrating, remembering, or making decisions?: Yes Patient able to express need for assistance with ADLs?: No (pt will only nod her head yes and no for questions when asked) Does the patient have difficulty dressing or bathing?: Yes Independently performs ADLs?: No Communication: Needs assistance Is this a change from baseline?: Pre-admission baseline Dressing (OT): Needs assistance Is this a change from baseline?: Pre-admission baseline Grooming: Needs assistance Is this a change from baseline?: Pre-admission baseline Feeding: Needs assistance Is this a change from baseline?: Pre-admission baseline Bathing: Needs assistance Is this a change from baseline?: Pre-admission baseline Toileting: Needs assistance Is this a change from baseline?: Pre-admission baseline In/Out Bed: Needs assistance Is this a change from baseline?: Pre-admission baseline Walks in Home: Needs assistance Is this a change from baseline?: Pre-admission baseline (walks with a walker) Does the patient have difficulty walking or climbing stairs?: Yes Weakness of Legs: None Weakness of Arms/Hands: None  Permission Sought/Granted                  Emotional Assessment Appearance:: Appears stated age Attitude/Demeanor/Rapport: Engaged Affect (typically observed): Accepting Orientation: : Oriented  to Self, Oriented to Place, Oriented to  Time, Oriented to Situation   Psych Involvement: No (comment)  Admission diagnosis:  Back pain [M54.9] Encephalopathy acute [G93.40] Acute hepatic encephalopathy [K72.00] Recurrent falls  [R29.6] Cirrhosis of liver with ascites (HCC) [K74.60, R18.8] Patient Active Problem List   Diagnosis Date Noted  . Recurrent falls 08/04/2019  . Multiple falls at home 08/03/2019  . 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 Goals of care, counseling/discussion 07/28/2019  . Acute encephalopathy 06/05/2019  . Chest pain 06/04/2019  . QT prolongation 06/04/2019  . Metabolic acidosis 20/10/710  . Dementia without behavioral disturbance ([place]) 05/10/2019  . Chronic hepatitis C without hepatic coma ([place]) 05/10/2019  . Hepatic encephalopathy ([place]) 05/09/2019  . Pancytopenia, acquired ([place])   . Cirrhosis of liver ([place])   . CKD (chronic kidney disease), stage III 04/29/2019  . COPD with chronic bronchitis ([place]) 04/29/2019  . Tobacco abuse 04/29/2019  . Alcoholism in remission ([place]) 04/29/2019  . Acute hepatic encephalopathy 04/29/2019  . AKI (acute kidney injury) ([place]) 04/29/2019  . Alcoholic cirrhosis of liver with ascites ([place]) 04/29/2019  . Thrombocytopenia ([place]) 04/29/2019  . Hyperkalemia 04/29/2019   PCP:  Vevelyn Francois, NP Pharmacy:   [id number] - [place], [address] [place] [address] Phone: [phone] Fax: [place], [place] [address] Phone: [phone] Fax: [phone]     Social Determinants of Health (SDOH) Interventions    Readmission Risk Interventions Readmission Risk Prevention Plan 08/07/2019 06/06/2019  Transportation Screening Complete Complete  HRI or Home Care Consult Not Complete -  HRI or Home Care Consult comments needs SNF -  Social Work Consult for [place] Planning/Counseling Complete -  Palliative Care Screening Not Applicable -  Medication Review Press photographer) Complete Complete  HRI or [place] - Complete  SW Recovery Care/Counseling Consult - Complete  Palliative Care  Screening - Not Applicable  Skilled Nursing Facility - Complete

## 2019-08-07 NOTE — Progress Notes (Signed)
PT ambulating in room. PT sates she is breathing fine at this time. PT denies need for any stoma care at this time- including suctioning.

## 2019-08-07 NOTE — Progress Notes (Addendum)
[facility]  PROGRESS NOTE  Jory Welke VPX:[id number] DOB: 1960-04-09 DOA: 08/03/2019 PCP: Vevelyn Francois, NP Admit date - 08/03/2019   Admitting Physician Desiree Hane, MD  Outpatient Primary MD for the patient is Vevelyn Francois, NP  LOS - 3 Brief Narrative   Judy Meza is a 59 y.o. year old female with medical history significant for hepatitis C cirrhosis, dementia, tobacco use, prior history of alcohol abuse, COPD, pancytopenia, CKD stage III who presented on 08/03/2019 with 2 to 3 weeks of recurrent falls, weakness in legs, intermittent confusion, poor adherence to medications, low back pain.  Patient was evaluated at her PCPs office on 08/03/2019.  At that time her sister reported upwards of 6 falls in the past 2 weeks.  Patient was recently discharged from skilled nursing facility after a 30-day stay earlier in the month of July.  Patient lives at home by herself.  Sisters assist with medications, and meals and are concerned and was found to havepatient has not been taking her lactulose amongst other medications .  In conversation with her PCP at the time patient requiring assistance with basic ADLs.  At the time of evaluation patient was found to be quite lethargic concerning for failure to thrive versus moderate acute process and was sent to the ER for further evaluation.  Of note this is her third admission in the past 2 months  In the ED patient was afebrile, Placed on 2 L for SPO2 of 90%, blood pressure 163/121.  72, WBC 3.2, hemoglobin 9.4, platelets 66.  CO2 20, creatinine 1.31, AST 46, T bili 1.9.  UA unremarkable.  UDS unremarkable.  Lipase 75. Covid test negative. Chest x-ray nonacute, CT head with generalized cerebral atrophy with no acute changes. X-ray of thoracolumbar spine with no evidence of acute fracture. CTA chest negative for PE, right lower lobe atelectasis, small amount of free peritoneal fluid.  [facility] service was called for further management  of potential failure to thrive in setting of hepatic encephalopathy.  Patient was started on lactulose therapy, resumed her home medications    Subjective  Reports mid back pain has been ongoing for 2 weeks.  Otherwise has no acute needs A & P   Hypoactive delirium, resolved.  Actively undergoing diabetic encephalopathy given improvement now that she is back on lactulose with no acute changes found on work-up mentioned previously in H&P.  Has intermittent slowed thinking, confusion over the past 2 to 3 weeks per family.  On exam alert and oriented to self, place, time.  At baseline does have dementia without behavioral disturbances and cerebral atrophy on CT head with nonacute changes.  TSH and B12 within normal limits, infectious work-up unremarkable to date.   -Lactulose therapy continue at current regimen -Avoid sedatives, hold home Ativan, hydroxyzine -Delirium precautions  Increased weakness, recurrent falls at home.  Differential includes side effect of decompensated hep C cirrhosis (hepatic encephalopathy, possible SBP), B12 deficiency, polypharmacy, orthostatic hypotension.  Blood pressure within normal limits currently. Orthostatic vitals within normal limits, B12 normal, i -Discontinued Questran given high risk for drowsiness/fatigue that could be contributing -OK to resume home ativan, no longer confused -PT eval recommends SNF  Acute hepatic encephalopathy, resolved.  In setting of poor adherence to lactulose therapy.  Patient inconsistently takes lactulose at home.  No current asterixis, mentation normal -Lactulose 20 g 3 times daily, home rifaximin, monitor bowel movements, goal 3 BMs daily -monitor mental status  Hep C cirrhosis, currently compensated.  Admitted with  hepatic encephalopathy.  Only minimal ascites on right upper quadrant ultrasound, mentation back to baseline -Continue home Lasix/Aldactone -Continue home Epclusa  Dementia with mood disturbances,  stable baseline 2-3 weeks ago alert and oriented x4 per family, and able to perform all ADLs on her own.  Now no longer to do so safely -Continue Paxil -Continue Namenda -PT recommends SNF  Subacute low back pain with paresthesias and possible radiculopathy, stable.  Slightly worsened in the setting of recent fall.  X-ray of lumbar spine showed no acute fracture.  No lower extremity weakness, but states tingling the lower extremities without any loss of strength, and asking for increase in tramadol for pain control. No saddle anesthesia, no urine/fecal incontinence -Obtain MRI lumbar/thoracic spine to ensure no spinal/neurologic pathology, in setting of seemingly paresthesias and recurrent falls on admission -Continue tramadol every 6 hours as needed  History of alcohol use. -Monitor on CIWA protocol for withdrawal -continue thiamine  GERD, stable -Continue PPI  CKD stage III, stable Creatinine at baseline -Avoid nephrotoxins, monitor BMP  Metabolic acidosis, chronic.  Pancytopenia in setting of hep C cirrhosis and CKD, chronic stable No signs or symptoms of bleeding currently. -Monitor CBC SCDs  COPD with chronic bronchitis.  No hypoxia, currently on 2 L. -Wean O2, goal SPO2 greater than 80%  Tobacco abuse, persists.  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  Evaluated by ENT earlier this month with instructions to quit smoking, and expand expectantly manage.  Followed by Dr. Elson Areas (oncology) dressing in place, no purulent drainage, CT chest unremarkable -Monitor      Family Communication  : Sister updated at bedside on 7/23, will call for update on 7/25  Code Status : DNR  Disposition Plan  :  Patient is from home. Anticipated d/c date:  Medically stable. Barriers to d/c or necessity for inpatient status:  Confusion essentially resolved, has no acute abnormalities,  PT  evaluated and recommend SNF as patient is at risk living by herself, and family unable to provide 24-hour assistance, back pain persists reports paresthesias in setting of recurrent falls rule out spinal/neurologic pathology with MRI lumbar/thoracic spine. Consults  : None  Procedures  : None  DVT Prophylaxis  : SCDs  Lab Results  Component Value Date   PLT 71 (L) 08/05/2019    Diet :  Diet Order            Diet regular Room service appropriate? Yes; Fluid consistency: Thin  Diet effective now                  Inpatient Medications Scheduled Meds: . furosemide  20 mg Oral Daily  . lactulose  20 g Oral TID  . loratadine  10 mg Oral Daily  . LORazepam  0.5 mg Oral QHS  . memantine  5 mg Oral BID  . pantoprazole  40 mg Oral Daily  . PARoxetine  40 mg Oral Daily  . rifaximin  550 mg Oral BID  . sodium bicarbonate  650 mg Oral BID  . Sofosbuvir-Velpatasvir  1 tablet Oral Daily  . spironolactone  100 mg Oral Daily  . thiamine  100 mg Oral Daily   Continuous Infusions: PRN Meds:.albuterol, hydrOXYzine, polyvinyl alcohol, traMADol  Antibiotics  :   Anti-infectives (From admission, onward)   Start     Dose/Rate Route Frequency Ordered Stop   08/04/19 1000  rifaximin (XIFAXAN) tablet 550 mg  Discontinue     550 mg Oral 2 times daily 08/03/19 2350     08/04/19 1000  Sofosbuvir-Velpatasvir 400-100 MG TABS 1 tablet     Discontinue     1 tablet Oral Daily 08/03/19 2350         Objective   Vitals:   08/06/19 1931 08/07/19 0650 08/07/19 0731 08/07/19 1324  BP:  122/84  (!) 133/92  Pulse: 90 88 94 86  Resp: 19 20 18    Temp:  98.1 F (36.7 C)  98.2 F (36.8 C)  TempSrc:  Oral  Oral  SpO2: 98% 97% 98% 97%  Weight:      Height:        SpO2: 97 % O2 Flow Rate (L/min): 2 L/min  Wt Readings from Last 3 Encounters:  08/04/19 58.8 kg  08/03/19 58.8 kg  07/28/19 57.8 kg     Intake/Output Summary (Last 24 hours) at 08/07/2019 1711 Last data filed at 08/07/2019  1636 Gross per 24 hour  Intake 240 ml  Output 1000 ml  Net -760 ml    Physical Exam:     Awake Alert, oriented to self place, context,  Follows commands, able to move upper and lower extremities without any appreciable focal deficits Lorenzo.AT, Normal respiratory effort on room air, diminished breath sounds at bases RRR,No Gallops,Rubs or new Murmurs,  Abd Soft and slightly distended, diminished bowel sounds, nontender Scattered bruises on lower extremities and upper extremities, no new rash   I have personally reviewed the following:   Data Reviewed:  CBC Recent Labs  Lab 08/03/19 1752 08/04/19 0453 08/05/19 0741  WBC 2.2* 3.2* 4.0  HGB 10.0* 9.4* 9.1*  HCT 31.5* 28.9* 27.9*  PLT 63* 66* 71*  MCV 97.8 96.3 96.9  MCH 31.1 31.3 31.6  MCHC 31.7 32.5 32.6  RDW 16.1* 16.3* 16.5*  LYMPHSABS 0.8  --  0.9  MONOABS 0.3  --  0.5  EOSABS 0.0  --  0.0  BASOSABS 0.0  --  0.0    Chemistries  Recent Labs  Lab 08/03/19 1752 08/04/19 0453 08/05/19 0741  NA 136 135 139  K 4.6 4.2 4.5  CL 106 107 107  CO2 21* 20* 17*  GLUCOSE 93 77 123*  BUN 15 14 15   CREATININE 1.31* 1.21* 1.40*  CALCIUM 9.7 9.2 9.6  AST 56* 46* 46*  ALT 38 32 32  ALKPHOS 54 49 54  BILITOT 1.9* 1.9* 1.2   ------------------------------------------------------------------------------------------------------------------ No results for input(s): CHOL, HDL, LDLCALC, TRIG, CHOLHDL, LDLDIRECT in the last 72 hours.  Lab Results  Component Value Date   HGBA1C 5.1 06/01/2019   ------------------------------------------------------------------------------------------------------------------ No results for input(s): TSH, T4TOTAL, T3FREE, THYROIDAB in the last 72 hours.  Invalid input(s): FREET3 ------------------------------------------------------------------------------------------------------------------ Recent Labs    08/05/19 0529  VITAMINB12 1,243*    Coagulation profile No results for  input(s): INR, PROTIME in the last 168 hours.  No results for input(s): DDIMER in the last 72 hours.  Cardiac Enzymes No results for input(s): CKMB, TROPONINI, MYOGLOBIN in the last 168 hours.  Invalid input(s): CK ------------------------------------------------------------------------------------------------------------------    Component Value Date/Time   BNP 21.4 04/30/2019 0553    Micro Results Recent Results (from the past 240 hour(s))  SARS Coronavirus 2 by RT PCR (hospital order, performed in [facility] hospital lab) Nasopharyngeal Nasopharyngeal Swab     Status: None   Collection Time: 08/03/19  5:52 PM   Specimen: Nasopharyngeal Swab  Result Value Ref Range Status   SARS Coronavirus  2 NEGATIVE NEGATIVE Final    Comment: (NOTE) SARS-CoV-2 target nucleic acids are NOT DETECTED.  The SARS-CoV-2 RNA is generally detectable in upper and lower respiratory specimens during the acute phase of infection. The lowest concentration of SARS-CoV-2 viral copies this assay can detect is 250 copies / mL. A negative result does not preclude SARS-CoV-2 infection and should not be used as the sole basis for treatment or other patient management decisions.  A negative result may occur with improper specimen collection / handling, submission of specimen other than nasopharyngeal swab, presence of viral mutation(s) within the areas targeted by this assay, and inadequate number of viral copies (<250 copies / mL). A negative result must be combined with clinical observations, patient history, and epidemiological information.  Fact Sheet for Patients:   StrictlyIdeas.no  Fact Sheet for Healthcare Providers: BankingDealers.co.za  This test is not yet approved or  cleared by the [place] FDA and has been authorized for detection and/or diagnosis of SARS-CoV-2 by FDA under an Emergency Use Authorization (EUA).  This EUA will remain in  effect (meaning this test can be used) for the duration of the COVID-19 declaration under Section 564(b)(1) of the Act, 21 U.S.C. section 360bbb-3(b)(1), unless the authorization is terminated or revoked sooner.  Performed at [facility], [place] [address] [place], [place] [address]     Radiology Reports DG THORACOLUMABAR SPINE  Result Date: 08/03/2019 CLINICAL DATA:  Multiple falls. EXAM: THORACOLUMBAR SPINE 1V COMPARISON:  None. FINDINGS: It should be noted that the T1 through T4 vertebral bodies are limited in evaluation secondary to image caught off. There is no evidence of acute fracture within the visualized portion of the thoracolumbar spine. There is normal alignment. Normal multilevel endplates and normal multilevel intervertebral disc spaces are seen. Mild calcification of the abdominal aorta is seen. IMPRESSION: Limited study, as described above, without evidence of acute fracture within the visualized portion of the thoracolumbar spine. Electronically Signed   By: Virgina Norfolk M.D.   On: 08/03/2019 22:09   CT Head Wo Contrast  Result Date: 08/03/2019 CLINICAL DATA:  Altered mental status with bilateral leg weakness. EXAM: CT HEAD WITHOUT CONTRAST TECHNIQUE: Contiguous axial images were obtained from the base of the skull through the vertex without intravenous contrast. COMPARISON:  None. FINDINGS: Brain: There is mild cerebral atrophy with widening of the extra-axial spaces and ventricular dilatation. There are areas of decreased attenuation within the white matter tracts of the supratentorial brain, consistent with microvascular disease changes. Vascular: No hyperdense vessel or unexpected calcification. Skull: Normal. Negative for fracture or focal lesion. Sinuses/Orbits: No acute finding. Other: None. IMPRESSION: 1. Generalized cerebral atrophy. 2. No acute intracranial abnormality. Electronically Signed   By: Virgina Norfolk M.D.   On: 08/03/2019 19:28   CT  ANGIO CHEST PE W OR WO CONTRAST  Result Date: 08/04/2019 CLINICAL DATA:  Cough. Episode of hypoxia this morning, improved after coughing up thick secretions. Negative D-dimer. EXAM: CT ANGIOGRAPHY CHEST WITH CONTRAST TECHNIQUE: Multidetector CT imaging of the chest was performed using the standard protocol during bolus administration of intravenous contrast. Multiplanar CT image reconstructions and MIPs were obtained to evaluate the vascular anatomy. CONTRAST:  30mL OMNIPAQUE IOHEXOL 350 MG/ML SOLN COMPARISON:  Portable chest dated 08/03/2019 FINDINGS: Cardiovascular: Atheromatous calcifications, including the coronary arteries and aorta. Normally opacified pulmonary arteries with no pulmonary arterial filling defects seen. Mediastinum/Nodes: No enlarged mediastinal, hilar, or axillary lymph nodes. Thyroid gland, trachea, and esophagus demonstrate no significant findings. Lungs/Pleura: Right lower  lobe atelectasis.  No pleural fluid. Upper Abdomen: Small amount of free peritoneal fluid. Musculoskeletal: Mild thoracic spine degenerative changes. Review of the MIP images confirms the above findings. IMPRESSION: 1. No pulmonary emboli. 2. Right lower lobe atelectasis. 3. Small amount of free peritoneal fluid. 4.  Calcific coronary artery and aortic atherosclerosis. Aortic Atherosclerosis (ICD10-I70.0). Electronically Signed   By: Claudie Revering M.D.   On: 08/04/2019 08:33   DG Chest Portable 1 View  Result Date: 08/03/2019 CLINICAL DATA:  Found on the floor. EXAM: PORTABLE CHEST 1 VIEW COMPARISON:  Jun 04, 2019 FINDINGS: Overlying cardiac lead wires are seen. There is no evidence of acute infiltrate, pleural effusion or pneumothorax. The heart size and mediastinal contours are within normal limits. The visualized skeletal structures are unremarkable. IMPRESSION: No active disease. Electronically Signed   By: Virgina Norfolk M.D.   On: 08/03/2019 18:36   [place] ASCITES (ABDOMEN LIMITED)  Result Date:  08/04/2019 CLINICAL DATA:  Ascites EXAM: LIMITED ABDOMEN ULTRASOUND FOR ASCITES TECHNIQUE: Limited ultrasound survey for ascites was performed in all four abdominal quadrants. COMPARISON:  CT 08/04/2019 FINDINGS: Sonography of the 4 quadrants is performed. Cirrhotic morphology of the liver. Small amount of ascites adjacent to the liver. No significant fluid in the lower quadrants. IMPRESSION: 1. Only small amount of ascites in the right upper quadrant adjacent to the liver 2. Cirrhotic morphology of the liver Electronically Signed   By: Donavan Foil M.D.   On: 08/04/2019 17:53     Time Spent in minutes  30     Desiree Hane M.D on 08/07/2019 at 5:11 PM  To page go to [email or web address] - password [facility]

## 2019-08-07 NOTE — NC FL2 (Signed)
[place] LEVEL OF CARE SCREENING TOOL     IDENTIFICATION  Patient Name: Judy Meza Birthdate: April 25, 1960 Sex: female Admission Date (Current Location): 08/03/2019  [facility] and [place] Number:  Herbalist and Address:  [facility],  [place] [address] [place]      Provider Number: [id number]  Attending Physician Name and Address:  Desiree Hane, MD  Relative Name and Phone Number:       Current Level of Care: Hospital Recommended Level of Care: [place] Prior Approval Number:    Date Approved/Denied:   PASRR Number: [id number]  Discharge Plan: SNF    Current Diagnoses: Patient Active Problem List   Diagnosis Date Noted  . Recurrent falls 08/04/2019  . Multiple falls at home 08/03/2019  . 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 Goals of care, counseling/discussion 07/28/2019  . Acute encephalopathy 06/05/2019  . Chest pain 06/04/2019  . QT prolongation 06/04/2019  . Metabolic acidosis 66/44/0347  . Dementia without behavioral disturbance ([place]) 05/10/2019  . Chronic hepatitis C without hepatic coma ([place]) 05/10/2019  . Hepatic encephalopathy ([place]) 05/09/2019  . Pancytopenia, acquired ([place])   . Cirrhosis of liver ([place])   . CKD (chronic kidney disease), stage III 04/29/2019  . COPD with chronic bronchitis ([place]) 04/29/2019  . Tobacco abuse 04/29/2019  . Alcoholism in remission ([place]) 04/29/2019  . Acute hepatic encephalopathy 04/29/2019  . AKI (acute kidney injury) ([place]) 04/29/2019  . Alcoholic cirrhosis of liver with ascites ([place]) 04/29/2019  . Thrombocytopenia ([place]) 04/29/2019  . Hyperkalemia 04/29/2019    Orientation RESPIRATION BLADDER Height & Weight     Self, Time, Situation, Place  Normal (has a stoma from an old tracheotomy site) Continent Weight: 58.8 kg Height:  5\' 2"  (157.5 cm)  BEHAVIORAL SYMPTOMS/MOOD NEUROLOGICAL BOWEL NUTRITION STATUS      Continent Diet  AMBULATORY STATUS  COMMUNICATION OF NEEDS Skin   Extensive Assist Verbally (has a stomy site, speech is very quiet) Normal                       Personal Care Assistance Level of Assistance  Bathing, Dressing, Total care Bathing Assistance: Maximum assistance   Dressing Assistance: Limited assistance Total Care Assistance: Maximum assistance   Functional Limitations Info             SPECIAL CARE FACTORS FREQUENCY  PT (By licensed PT), OT (By licensed OT)     PT Frequency: 5x weekly OT Frequency: 5x weekly            Contractures Contractures Info: Not present    Additional Factors Info  Code Status, Allergies Code Status Info: DNR Allergies Info: NKDA           Current Medications (08/07/2019):  This is the current hospital active medication list Current Facility-Administered Medications  Medication Dose Route Frequency Provider Last Rate Last Admin  . albuterol (PROVENTIL) (2.5 MG/3ML) 0.083% nebulizer solution 2.5 mg  2.5 mg Nebulization Q6H PRN Reubin Milan, MD      . furosemide (LASIX) tablet 20 mg  20 mg Oral Daily Reubin Milan, MD   20 mg at 08/07/19 1032  . hydrOXYzine (ATARAX/VISTARIL) tablet 10 mg  10 mg Oral TID PRN Reubin Milan, MD   10 mg at 08/07/19 0755  . lactulose (CHRONULAC) 10 GM/15ML solution 20 g  20 g Oral TID Reubin Milan, MD   20 g at 08/07/19 1030  .  loratadine (CLARITIN) tablet 10 mg  10 mg Oral Daily Reubin Milan, MD   10 mg at 08/07/19 1032  . LORazepam (ATIVAN) tablet 0.5 mg  0.5 mg Oral QHS Reubin Milan, MD   0.5 mg at 08/06/19 2109  . memantine (NAMENDA) tablet 5 mg  5 mg Oral BID Reubin Milan, MD   5 mg at 08/07/19 1030  . pantoprazole (PROTONIX) EC tablet 40 mg  40 mg Oral Daily Oretha Milch D, MD   40 mg at 08/06/19 1714  . PARoxetine (PAXIL) tablet 40 mg  40 mg Oral Daily Oretha Milch D, MD   40 mg at 08/07/19 1032  . polyvinyl alcohol (LIQUIFILM TEARS) 1.4 % ophthalmic solution 1 drop  1 drop  Both Eyes PRN Oretha Milch D, MD   1 drop at 08/05/19 0818  . rifaximin (XIFAXAN) tablet 550 mg  550 mg Oral BID Reubin Milan, MD   550 mg at 08/07/19 1033  . sodium bicarbonate tablet 650 mg  650 mg Oral BID Oretha Milch D, MD   650 mg at 08/07/19 1033  . Sofosbuvir-Velpatasvir 400-100 MG TABS 1 tablet  1 tablet Oral Daily Reubin Milan, MD   1 tablet at 08/07/19 1038  . spironolactone (ALDACTONE) tablet 100 mg  100 mg Oral Daily Reubin Milan, MD   100 mg at 08/07/19 1031  . thiamine tablet 100 mg  100 mg Oral Daily Reubin Milan, MD   100 mg at 08/07/19 1032  . traMADol (ULTRAM) tablet 50 mg  50 mg Oral Q6H PRN Oretha Milch D, MD   50 mg at 08/07/19 7858     Discharge Medications: Please see discharge summary for a list of discharge medications.  Relevant Imaging Results:  Relevant Lab Results:   Additional Information SSN [id number]  Joaquin Courts, RN

## 2019-08-08 DIAGNOSIS — M545 Low back pain: Secondary | ICD-10-CM | POA: Diagnosis not present

## 2019-08-08 DIAGNOSIS — N1831 Chronic kidney disease, stage 3a: Secondary | ICD-10-CM | POA: Diagnosis not present

## 2019-08-08 NOTE — Progress Notes (Signed)
RT checked stoma and patient stated that she was okay and did not need any stoma care at this time

## 2019-08-08 NOTE — TOC Progression Note (Signed)
Transition of Care [facility]) - Progression Note    Patient Details  Name: Judy Meza MRN: [id number] Date of Birth: Nov 13, 1960  [facility]) CM/SW Contact  Judy Meza, [name], RN Phone Number: 08/08/2019, 10:14 AM  Clinical Narrative: Spoke to patient/family-sisters Judy Meza/Judy Meza-all in agreement to SNF. Per family-covid vaccine 2/21,4/21-Moderna, trach-7/20,old stoma 4/21.PT recc SNF. Patient home alone, hx falls-per family no support @ home-needs SNF. PACE services to start 8/1. Awaiting bed offers from [facility].Per MD medically stable for d/c-unsafe to d/c home need SNF.     Expected Discharge Plan: Skilled Nursing Facility Barriers to Discharge: Other (comment) (bed offers;SNF auth pending)  Expected Discharge Plan and Services Expected Discharge Plan: [place]   Discharge Planning Services: CM Consult Post Acute Care Choice: [place] Living arrangements for the past 2 months: Apartment                 DME Arranged: N/A DME Agency: NA       HH Arranged: NA HH Agency: NA         Social Determinants of Health (SDOH) Interventions    Readmission Risk Interventions Readmission Risk Prevention Plan 08/07/2019 06/06/2019  Transportation Screening Complete Complete  HRI or Home Care Consult Not Complete -  [place] or Home Care Consult comments needs SNF -  Social Work Consult for [place] Planning/Counseling Complete -  Palliative Care Screening Not Applicable -  Medication Review Press photographer) Complete Complete  HRI or [place] - Complete  SW Recovery Care/Counseling Consult - Complete  Palliative Care Screening - Not Applicable  Skilled Nursing Facility - Complete

## 2019-08-08 NOTE — Progress Notes (Signed)
PT states she is breathing fine at this time. Sp02 98% on RA. PT denies need for stoma care at this time- stoma is covered. PT denies need for stoma suctioning at this time.

## 2019-08-08 NOTE — TOC Progression Note (Signed)
Transition of Care [facility]) - Progression Note    Patient Details  Name: Judy Meza MRN: [id number] Date of Birth: [birth year]  Transition of Care [facility]) CM/SW Contact  Haitham Dolinsky, [name], RN Phone Number: 08/08/2019, 2:20 PM  Clinical Narrative: PT recc SNF/24hr supv-ambulate 24oft min asst-patient home alone-has sisters but are not able to asst @ home. Unable to get SNF facility to accept currently-all agree to home w/HHC-HHPT/HHRN. [facility] to start services on 8/1. Checking for [facility] agency. [facility] to assess for custodial level services. Will need transport home-w/c service-nsg to provide.    Expected Discharge Plan: [place] Barriers to Discharge: Barriers Unresolved (comment) ([place] agency to accept insurance.)  Expected Discharge Plan and Services Expected Discharge Plan: [place]   Discharge Planning Services: CM Consult Post Acute Care Choice: [place] Living arrangements for the past 2 months: Apartment                 DME Arranged: N/A DME Agency: NA       HH Arranged: NA HH Agency: NA         Social Determinants of Health (SDOH) Interventions    Readmission Risk Interventions Readmission Risk Prevention Plan 08/07/2019 06/06/2019  Transportation Screening Complete Complete  HRI or Home Care Consult Not Complete -  HRI or Home Care Consult comments needs SNF -  Social Work Consult for [place] Planning/Counseling Complete -  Palliative Care Screening Not Applicable -  Medication Review Press photographer) Complete Complete  HRI or [place] - Complete  SW Recovery Care/Counseling Consult - Complete  Palliative Care Screening - Not Applicable  Skilled Nursing Facility - Complete

## 2019-08-08 NOTE — Progress Notes (Signed)
[facility]  PROGRESS NOTE  Judy Meza BSW:[id number] DOB: 09-10-1960 DOA: 08/03/2019 PCP: Vevelyn Francois, NP Admit date - 08/03/2019   Admitting Physician Desiree Hane, MD  Outpatient Primary MD for the patient is Vevelyn Francois, NP  LOS - 4 Brief Narrative   Judy Meza is a 59 y.o. year old female with medical history significant for hepatitis C cirrhosis, dementia, tobacco use, prior history of alcohol abuse, COPD, pancytopenia, CKD stage III who presented on 08/03/2019 with 2 to 3 weeks of recurrent falls, weakness in legs, intermittent confusion, poor adherence to medications, low back pain.  Patient was evaluated at her PCPs office on 08/03/2019.  At that time her sister reported upwards of 6 falls in the past 2 weeks.  Patient was recently discharged from skilled nursing facility after a 30-day stay earlier in the month of July.  Patient lives at home by herself.  Sisters assist with medications, and meals and are concerned and was found to havepatient has not been taking her lactulose amongst other medications .  In conversation with her PCP at the time patient requiring assistance with basic ADLs.  At the time of evaluation patient was found to be quite lethargic concerning for failure to thrive versus moderate acute process and was sent to the ER for further evaluation.  Of note this is her third admission in the past 2 months  In the ED patient was afebrile, Placed on 2 L for SPO2 of 90%, blood pressure 163/121.  72, WBC 3.2, hemoglobin 9.4, platelets 66.  CO2 20, creatinine 1.31, AST 46, T bili 1.9.  UA unremarkable.  UDS unremarkable.  Lipase 75. Covid test negative. Chest x-ray nonacute, CT head with generalized cerebral atrophy with no acute changes. X-ray of thoracolumbar spine with no evidence of acute fracture. CTA chest negative for PE, right lower lobe atelectasis, small amount of free peritoneal fluid.  [facility] hospital[facility] service was called for further management  of potential failure to thrive in setting of hepatic encephalopathy.  Patient was started on lactulose therapy, resumed her home medications    Subjective  Reports  Back pain but managed with pain medicine, dry, itchy skin A & P   Hypoactive delirium, resolved.  Actively undergoing diabetic encephalopathy given improvement now that she is back on lactulose with no acute changes found on work-up mentioned previously in H&P.  Has intermittent slowed thinking, confusion over the past 2 to 3 weeks per family.  On exam alert and oriented to self, place, time.  At baseline does have dementia without behavioral disturbances and cerebral atrophy on CT head with nonacute changes.  TSH and B12 within normal limits, infectious work-up unremarkable to date.   -Lactulose therapy continue at current regimen -Avoid sedatives, hold home Ativan, hydroxyzine -Delirium precautions  Increased weakness, recurrent falls at home.  Differential includes side effect of decompensated hep C cirrhosis (hepatic encephalopathy, possible SBP), B12 deficiency, polypharmacy, orthostatic hypotension.  Blood pressure within normal limits currently. Orthostatic vitals within normal limits, B12 normal,  -Discontinued Questran given high risk for drowsiness/fatigue that could be contributing -OK to resume home ativan, no longer confused -PT eval recommends SNF --Patient home alone does not have 24 hour assistance and has had recurrent falls, unsafe discharge back home needs SNF  Acute hepatic encephalopathy, resolved.  In setting of poor adherence to lactulose therapy.  Patient inconsistently takes lactulose at home.  No current asterixis, mentation normal -Lactulose 20 g 3 times daily, home rifaximin, monitor bowel movements,  goal 3 BMs daily -monitor mental status  Hep C cirrhosis, currently compensated.  Admitted with hepatic encephalopathy.  Only minimal ascites on right upper quadrant ultrasound, mentation back to  baseline -Continue home Lasix/Aldactone -Continue home Epclusa  Dementia with mood disturbances, stable baseline 2-3 weeks ago alert and oriented x4 per family, and able to perform all ADLs on her own.  Now no longer to do so safely -Continue Paxil -Continue Namenda -PT recommends SNF  Subacute low back pain with paresthesias and possible radiculopathy, stable.  Slightly worsened in the setting of recent fall.  X-ray of lumbar spine showed no acute fracture.  MRI lumbar spine non acute. No lower extremity weakness, but states tingling the lower extremities without any loss of strength, and asking for increase in tramadol for pain control. No saddle anesthesia, no urine/fecal incontinence -Continue tramadol every 6 hours as needed --oxycodone for breakthrough pain PRN  History of alcohol use. No signs of withdrawal. Outside of window -continue thiamine  GERD, stable -Continue PPI  CKD stage III, stable Creatinine at baseline -Avoid nephrotoxins, monitor BMP  Metabolic acidosis, chronic.  Pancytopenia in setting of hep C cirrhosis and CKD, chronic stable No signs or symptoms of bleeding currently. -Monitor CBC SCDs  COPD with chronic bronchitis.  No hypoxia, currently on 2 L. -Wean O2, goal SPO2 greater than 80%  Tobacco abuse, persists.  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  Evaluated by ENT earlier this month with instructions to quit smoking, and expand expectantly manage.  Followed by Dr. Elson Areas (oncology) dressing in place, no purulent drainage, CT chest unremarkable -Monitor      Family Communication  : Sister updated at bedside on 7/23, will call for update on 7/25  Code Status : DNR  Disposition Plan  :  Patient is from home. Anticipated d/c date:  Medically stable. Barriers to d/c or necessity for inpatient status: PT evaluated and recommend SNF as patient is at risk  living by herself has had multiple falls and family unable to provide 24-hour assistance, , unsafe discharge back home needs SNF. Consults  : None  Procedures  : None  DVT Prophylaxis  : SCDs  Lab Results  Component Value Date   PLT 71 (L) 08/05/2019    Diet :  Diet Order            Diet regular Room service appropriate? Yes; Fluid consistency: Thin  Diet effective now                  Inpatient Medications Scheduled Meds:  furosemide  20 mg Oral Daily   hydrocerin   Topical BID   lactulose  20 g Oral TID   loratadine  10 mg Oral Daily   LORazepam  0.5 mg Oral QHS   memantine  5 mg Oral BID   pantoprazole  40 mg Oral Daily   PARoxetine  40 mg Oral Daily   rifaximin  550 mg Oral BID   sodium bicarbonate  650 mg Oral BID   Sofosbuvir-Velpatasvir  1 tablet Oral Daily   spironolactone  100 mg Oral Daily   thiamine  100 mg Oral Daily   Continuous Infusions: PRN Meds:.albuterol, hydrOXYzine, oxyCODONE, polyvinyl alcohol, traMADol  Antibiotics  :   Anti-infectives (From admission, onward)   Start     Dose/Rate Route Frequency Ordered Stop   08/04/19 1000  rifaximin (XIFAXAN) tablet 550 mg  Discontinue     550 mg Oral 2 times daily 08/03/19 2350     08/04/19 1000  Sofosbuvir-Velpatasvir 400-100 MG TABS 1 tablet     Discontinue     1 tablet Oral Daily 08/03/19 2350         Objective   Vitals:   08/08/19 0555 08/08/19 0822 08/08/19 1108 08/08/19 1252  BP: (!) 129/84  122/81 (!) 131/95  Pulse: 88 87 85 91  Resp: 18 17 16 16   Temp: 98.4 F (36.9 C)  98.1 F (36.7 C) 97.9 F (36.6 C)  TempSrc: Oral  Oral Oral  SpO2: 100% 98% 100% 96%  Weight:      Height:        SpO2: 96 % O2 Flow Rate (L/min): 2 L/min  Wt Readings from Last 3 Encounters:  08/04/19 58.8 kg  08/03/19 58.8 kg  07/28/19 57.8 kg     Intake/Output Summary (Last 24 hours) at 08/08/2019 2106 Last data filed at 08/08/2019 1730 Gross per 24 hour  Intake 600 ml  Output 901 ml   Net -301 ml    Physical Exam:     Awake Alert, oriented to self place, context,  Follows commands, able to move upper and lower extremities without any appreciable focal deficits [place].AT, Normal respiratory effort on room air, diminished breath sounds at bases RRR,No Gallops,Rubs or new Murmurs,  Abd Soft and slightly distended, diminished bowel sounds, nontender Scattered bruises on lower extremities and upper extremities, no new rash   I have personally reviewed the following:   Data Reviewed:  CBC Recent Labs  Lab 08/03/19 1752 08/04/19 0453 08/05/19 0741  WBC 2.2* 3.2* 4.0  HGB 10.0* 9.4* 9.1*  HCT 31.5* 28.9* 27.9*  PLT 63* 66* 71*  MCV 97.8 96.3 96.9  MCH 31.1 31.3 31.6  MCHC 31.7 32.5 32.6  RDW 16.1* 16.3* 16.5*  LYMPHSABS 0.8  --  0.9  MONOABS 0.3  --  0.5  EOSABS 0.0  --  0.0  BASOSABS 0.0  --  0.0    Chemistries  Recent Labs  Lab 08/03/19 1752 08/04/19 0453 08/05/19 0741  NA 136 135 139  K 4.6 4.2 4.5  CL 106 107 107  CO2 21* 20* 17*  GLUCOSE 93 77 123*  BUN 15 14 15   CREATININE 1.31* 1.21* 1.40*  CALCIUM 9.7 9.2 9.6  AST 56* 46* 46*  ALT 38 32 32  ALKPHOS 54 49 54  BILITOT 1.9* 1.9* 1.2   ------------------------------------------------------------------------------------------------------------------ No results for input(s): CHOL, HDL, LDLCALC, TRIG, CHOLHDL, LDLDIRECT in the last 72 hours.  Lab Results  Component Value Date   HGBA1C 5.1 06/01/2019   ------------------------------------------------------------------------------------------------------------------ No results for input(s): TSH, T4TOTAL, T3FREE, THYROIDAB in the last 72 hours.  Invalid input(s): FREET3 ------------------------------------------------------------------------------------------------------------------ No results for input(s): VITAMINB12, FOLATE, FERRITIN, TIBC, IRON, RETICCTPCT in the last 72 hours.  Coagulation profile No results for input(s): INR,  PROTIME in the last 168 hours.  No results for input(s): DDIMER in the last 72 hours.  Cardiac Enzymes No results for input(s): CKMB, TROPONINI, MYOGLOBIN in the last 168 hours.  Invalid input(s): CK ------------------------------------------------------------------------------------------------------------------    Component Value Date/Time   BNP 21.4 04/30/2019 0553    Micro Results Recent Results (from the past 240 hour(s))  SARS Coronavirus 2 by RT PCR (hospital order, performed in [facility] hospital lab) Nasopharyngeal Nasopharyngeal Swab     Status: None   Collection Time: 08/03/19  5:52 PM   Specimen: Nasopharyngeal Swab  Result Value  Ref Range Status   SARS Coronavirus 2 NEGATIVE NEGATIVE Final    Comment: (NOTE) SARS-CoV-2 target nucleic acids are NOT DETECTED.  The SARS-CoV-2 RNA is generally detectable in upper and lower respiratory specimens during the acute phase of infection. The lowest concentration of SARS-CoV-2 viral copies this assay can detect is 250 copies / mL. A negative result does not preclude SARS-CoV-2 infection and should not be used as the sole basis for treatment or other patient management decisions.  A negative result may occur with improper specimen collection / handling, submission of specimen other than nasopharyngeal swab, presence of viral mutation(s) within the areas targeted by this assay, and inadequate number of viral copies (<250 copies / mL). A negative result must be combined with clinical observations, patient history, and epidemiological information.  Fact Sheet for Patients:   StrictlyIdeas.no  Fact Sheet for Healthcare Providers: BankingDealers.co.za  This test is not yet approved or  cleared by the [place] FDA and has been authorized for detection and/or diagnosis of SARS-CoV-2 by FDA under an Emergency Use Authorization (EUA).  This EUA will remain in effect (meaning  this test can be used) for the duration of the COVID-19 declaration under Section 564(b)(1) of the Act, 21 U.S.C. section 360bbb-3(b)(1), unless the authorization is terminated or revoked sooner.  Performed at [facility], [place] [address], [place], [place] [address]     Radiology Reports DG THORACOLUMABAR SPINE  Result Date: 08/03/2019 CLINICAL DATA:  Multiple falls. EXAM: THORACOLUMBAR SPINE 1V COMPARISON:  None. FINDINGS: It should be noted that the T1 through T4 vertebral bodies are limited in evaluation secondary to image caught off. There is no evidence of acute fracture within the visualized portion of the thoracolumbar spine. There is normal alignment. Normal multilevel endplates and normal multilevel intervertebral disc spaces are seen. Mild calcification of the abdominal aorta is seen. IMPRESSION: Limited study, as described above, without evidence of acute fracture within the visualized portion of the thoracolumbar spine. Electronically Signed   By: Virgina Norfolk M.D.   On: 08/03/2019 22:09   CT Head Wo Contrast  Result Date: 08/03/2019 CLINICAL DATA:  Altered mental status with bilateral leg weakness. EXAM: CT HEAD WITHOUT CONTRAST TECHNIQUE: Contiguous axial images were obtained from the base of the skull through the vertex without intravenous contrast. COMPARISON:  None. FINDINGS: Brain: There is mild cerebral atrophy with widening of the extra-axial spaces and ventricular dilatation. There are areas of decreased attenuation within the white matter tracts of the supratentorial brain, consistent with microvascular disease changes. Vascular: No hyperdense vessel or unexpected calcification. Skull: Normal. Negative for fracture or focal lesion. Sinuses/Orbits: No acute finding. Other: None. IMPRESSION: 1. Generalized cerebral atrophy. 2. No acute intracranial abnormality. Electronically Signed   By: Virgina Norfolk M.D.   On: 08/03/2019 19:28   CT ANGIO CHEST PE  W OR WO CONTRAST  Result Date: 08/04/2019 CLINICAL DATA:  Cough. Episode of hypoxia this morning, improved after coughing up thick secretions. Negative D-dimer. EXAM: CT ANGIOGRAPHY CHEST WITH CONTRAST TECHNIQUE: Multidetector CT imaging of the chest was performed using the standard protocol during bolus administration of intravenous contrast. Multiplanar CT image reconstructions and MIPs were obtained to evaluate the vascular anatomy. CONTRAST:  109mL OMNIPAQUE IOHEXOL 350 MG/ML SOLN COMPARISON:  Portable chest dated 08/03/2019 FINDINGS: Cardiovascular: Atheromatous calcifications, including the coronary arteries and aorta. Normally opacified pulmonary arteries with no pulmonary arterial filling defects seen. Mediastinum/Nodes: No enlarged mediastinal, hilar, or axillary lymph nodes. Thyroid gland, trachea, and esophagus  demonstrate no significant findings. Lungs/Pleura: Right lower lobe atelectasis.  No pleural fluid. Upper Abdomen: Small amount of free peritoneal fluid. Musculoskeletal: Mild thoracic spine degenerative changes. Review of the MIP images confirms the above findings. IMPRESSION: 1. No pulmonary emboli. 2. Right lower lobe atelectasis. 3. Small amount of free peritoneal fluid. 4.  Calcific coronary artery and aortic atherosclerosis. Aortic Atherosclerosis (ICD10-I70.0). Electronically Signed   By: Claudie Revering M.D.   On: 08/04/2019 08:33   MR THORACIC SPINE WO CONTRAST  Result Date: 08/07/2019 CLINICAL DATA:  59 year old female with back pain and leg weakness for 2-3 weeks. Recent falls, found down. A study without and with contrast was planned, but the patient declined IV contrast. EXAM: MRI THORACIC SPINE WITHOUT CONTRAST TECHNIQUE: Multiplanar, multisequence MR imaging of the thoracic spine was performed. No intravenous contrast was administered. COMPARISON:  Thoracolumbar radiographs 08/03/2019 FINDINGS: Limited cervical spine imaging: Negative aside from straightening of cervical lordosis.  Thoracic spine segmentation:  Appears to be normal. Alignment: Mild straightening of thoracic kyphosis. No spondylolisthesis. Vertebrae: No marrow edema or evidence of acute osseous abnormality. Visualized bone marrow signal is within normal limits. Cord: Normal thoracic spinal cord. The conus medullaris occurs below T12-L1 and is not completely included. Capacious thoracic spinal canal. Paraspinal and other soft tissues: Negative. Disc levels: Capacious thoracic spinal canal with no age advanced degenerative changes, no spinal or foraminal stenosis. IMPRESSION: Normal for age non-contrast MRI appearance of the thoracic spine. The patient declined IV contrast. Electronically Signed   By: Genevie Ann M.D.   On: 08/07/2019 19:10   MR LUMBAR SPINE WO CONTRAST  Result Date: 08/07/2019 CLINICAL DATA:  59 year old female with back pain and leg weakness for 2-3 weeks. Recent falls, found down. EXAM: MRI LUMBAR SPINE WITHOUT CONTRAST TECHNIQUE: Multiplanar, multisequence MR imaging of the lumbar spine was performed. No intravenous contrast was administered. COMPARISON:  None. FINDINGS: The examination had to be discontinued prior to completion by patient request. Only axial and sagittal T2 weighted imaging was obtained, axial images somewhat motion degraded. Segmentation: Normal, concordant with the thoracic numbering today. Vestigial S1-S2 disc space. Alignment:  Normal lumbar lordosis.  No spondylolisthesis. Vertebrae: T2 marrow signal is within normal limits. No osseous abnormality is evident. Visible sacrum and SI joints appear intact. Conus medullaris and cauda equina: Mildly degraded by motion but appears normal at the L1-L2 level. Capacious lumbar spinal canal. Paraspinal and other soft tissues: Negative. Disc levels: Capacious lumbar spinal canal with no age advanced degenerative changes, no lumbar spinal stenosis, no significant lumbar foraminal stenosis. IMPRESSION: Truncated exam, but unremarkable for age T2  weighted MRI images of the lumbar spine. Electronically Signed   By: Genevie Ann M.D.   On: 08/07/2019 19:14   DG Chest Portable 1 View  Result Date: 08/03/2019 CLINICAL DATA:  Found on the floor. EXAM: PORTABLE CHEST 1 VIEW COMPARISON:  Jun 04, 2019 FINDINGS: Overlying cardiac lead wires are seen. There is no evidence of acute infiltrate, pleural effusion or pneumothorax. The heart size and mediastinal contours are within normal limits. The visualized skeletal structures are unremarkable. IMPRESSION: No active disease. Electronically Signed   By: Virgina Norfolk M.D.   On: 08/03/2019 18:36   [place] ASCITES (ABDOMEN LIMITED)  Result Date: 08/04/2019 CLINICAL DATA:  Ascites EXAM: LIMITED ABDOMEN ULTRASOUND FOR ASCITES TECHNIQUE: Limited ultrasound survey for ascites was performed in all four abdominal quadrants. COMPARISON:  CT 08/04/2019 FINDINGS: Sonography of the 4 quadrants is performed. Cirrhotic morphology of the liver. Small amount of ascites  adjacent to the liver. No significant fluid in the lower quadrants. IMPRESSION: 1. Only small amount of ascites in the right upper quadrant adjacent to the liver 2. Cirrhotic morphology of the liver Electronically Signed   By: Donavan Foil M.D.   On: 08/04/2019 17:53     Time Spent in minutes  30     Desiree Hane M.D on 08/08/2019 at 9:06 PM  To page go to [email or web address] - password [facility]

## 2019-08-08 NOTE — Consult Note (Signed)
Pt alert and aware sitting up in bed. Slow speaking she has a trach. She called her sister and I spoke to her on the phone. The sister wanted to know what the process for a AD. I explained it to her and left the form for her to fill out. The chaplain will follow-up with pt tomorrow to try and complete the process.

## 2019-08-09 DIAGNOSIS — F0390 Unspecified dementia without behavioral disturbance: Secondary | ICD-10-CM | POA: Diagnosis not present

## 2019-08-09 DIAGNOSIS — K7200 Acute and subacute hepatic failure without coma: Secondary | ICD-10-CM | POA: Diagnosis not present

## 2019-08-09 DIAGNOSIS — J449 Chronic obstructive pulmonary disease, unspecified: Secondary | ICD-10-CM | POA: Diagnosis not present

## 2019-08-09 DIAGNOSIS — K7031 Alcoholic cirrhosis of liver with ascites: Secondary | ICD-10-CM | POA: Diagnosis not present

## 2019-08-09 MED ORDER — SODIUM BICARBONATE 650 MG PO TABS: 650.0000 mg | ORAL_TABLET | Freq: Two times a day (BID) | ORAL | 0 refills | Status: AC

## 2019-08-09 NOTE — Progress Notes (Signed)
Physical Therapy Treatment Patient Details Name: Judy Meza MRN: [id number] DOB: 03-19-1960 Today's Date: 08/09/2019    History of Present Illness 59 y.o. female with medical history significant of COPD, throat cancer status post tracheostomy, cirrhosis of liver, chronic kidney disease stage III, hypertension, alcohol abuse in remission, tobacco abuse, history of IVDU in remission, hepatitis C, depression, dementia.  Pt admitted for Acute hepatic encephalopathy, increased weakness, and recurrent falls at home    PT Comments    Pt assisted with ambulating in hallway and tolerated improved distance.  Pt with narrow BOS and shuffling gait and declines using assistive device so recommend assist available for safety at home, also with history of recurrent falls prior to admission.  Pt would benefit from HHPT (since per chart review, pt to d/c home).   Follow Up Recommendations  Home health PT;Supervision/Assistance - 24 hour     Equipment Recommendations  None recommended by PT    Recommendations for Other Services       Precautions / Restrictions Precautions Precautions: Fall    Mobility  Bed Mobility Overal bed mobility: Independent                Transfers Overall transfer level: Modified independent               General transfer comment: pt standing in room at EOB on arrival  Ambulation/Gait Ambulation/Gait assistance: Min guard Gait Distance (Feet): 400 Feet Assistive device: None Gait Pattern/deviations: Step-through pattern;Decreased stride length;Narrow base of support;Shuffle     General Gait Details: pt with very little lift of feet from floor, shuffling, occasionally holding hand rail, min/guard for safety   Stairs             Wheelchair Mobility    Modified Rankin (Stroke Patients Only)       Balance Overall balance assessment: History of Falls                                          Cognition  Arousal/Alertness: Awake/alert Behavior During Therapy: Flat affect Overall Cognitive Status: Within Functional Limits for tasks assessed                                        Exercises      General Comments        Pertinent Vitals/Pain Pain Assessment: No/denies pain    Home Living                      Prior Function            PT Goals (current goals can now be found in the care plan section) Progress towards PT goals: Progressing toward goals    Frequency    Min 2X/week      PT Plan Discharge plan needs to be updated    Co-evaluation              AM-PAC PT "6 Clicks" Mobility   Outcome Measure  Help needed turning from your back to your side while in a flat bed without using bedrails?: None Help needed moving from lying on your back to sitting on the side of a flat bed without using bedrails?: None Help needed moving to and from a bed to a chair (including a wheelchair)?:  None Help needed standing up from a chair using your arms (e.g., wheelchair or bedside chair)?: None Help needed to walk in hospital room?: A Little Help needed climbing 3-5 steps with a railing? : A Little 6 Click Score: 22    End of Session Equipment Utilized During Treatment: Gait belt Activity Tolerance: Patient tolerated treatment well Patient left: with call bell/phone within reach;Other (comment) (pt standing EOB packing up (plans to d/c today)) Nurse Communication: Mobility status PT Visit Diagnosis: Other abnormalities of gait and mobility (R26.89);Repeated falls (R29.6)     Time: 9629-5284 PT Time Calculation (min) (ACUTE ONLY): 14 min  Charges:  $Gait Training: 8-22 mins                     Arlyce Dice, DPT Acute Rehabilitation Services Pager: [phone] Office: [phone]  York Ram E 08/09/2019, 2:10 PM

## 2019-08-09 NOTE — Progress Notes (Signed)
I completed a follow up visit with the patient to check the status of the AD. I visited the patient's room and found her sitting in a chair beside the window. She shared that she was preparing to be discharged soon. I shared words of encouragement concerning going home and led in prayer.    08/09/19 1250  Clinical Encounter Type  Visited With Patient  Visit Type Follow-up;Spiritual support  Referral From Nurse  Consult/Referral To Chaplain  Spiritual Encounters  Spiritual Needs Prayer    Chaplain Dr Redgie Grayer

## 2019-08-09 NOTE — TOC Transition Note (Addendum)
Transition of Care [facility]) - CM/SW Discharge Note   Patient Details  Name: Judy Meza MRN: [id number] Date of Birth: 18-Dec-1960  Transition of [facility]) CM/SW Contact:  Dessa Phi, RN Phone Number: 08/09/2019, 10:36 AM   Clinical Narrative: 3:42p Transportation-[facility]-[phone] will call floor once @ hospital. 1p-Per [facility] transportation-they confirmed they will pick patient up from hospital @3p . They will have PCS service set up for start of care today. No further CM needs. 12:23p-Managed care [employer] rep Desmond Lope requested PCS form to be completed I hospital-form completed & faxed w/confirmation back to [phone] for PCS services to be in place for a safe d/c home.  Patient/faily sisters Robin/Kathryn-all agree to d/c home understanding per managed medicaid rep Colletta [place] they have to get auth for HHC,Personal care Services,& transportation-HHC, & PCS services likely will not be set up today-family in agreement on d/c home without these services in place today-family will check on patient. Transportation will be set up by insurance-awaiting auth. PACE services to start 8/1.      Final next level of care: [place] Barriers to Discharge: Unsafe home situation, Other (comment), No [place] will accept this patient (Per managed medicaid-they have a process to set up [facility] services for approval-they will submit form-this will take a day or 2.)   Patient Goals and CMS Choice Patient states their goals for this hospitalization and ongoing recovery are:: go home CMS Medicare.gov Compare Post Acute Care list provided to:: Patient Represenative (must comment) (sister Curt Bears) Choice offered to / list presented to : Patient, Sibling  Discharge Placement                       Discharge Plan and Services   Discharge Planning Services: CM Consult Post Acute Care Choice:  (Personal care services through her managed  medicaid.Family in agreement to services.PACE service to start 8/1.)          DME Arranged: N/A DME Agency: NA       HH Arranged: NA HH Agency: NA        Social Determinants of Health (SDOH) Interventions     Readmission Risk Interventions Readmission Risk Prevention Plan 08/07/2019 06/06/2019  Transportation Screening Complete Complete  HRI or [place] Not Complete -  HRI or Home Care Consult comments needs SNF -  Social Work Consult for [place] Planning/Counseling Complete -  Palliative Care Screening Not Applicable -  Medication Review Press photographer) Complete Complete  HRI or [place] - Complete  SW Recovery Care/Counseling Consult - Complete  Palliative Care Screening - Not Applicable  Skilled Nursing Facility - Complete

## 2019-08-09 NOTE — Discharge Summary (Signed)
Physician Discharge Summary  Judy Meza STM:[id number] DOB: 1960/08/04 DOA: 08/03/2019  PCP: Vevelyn Francois, NP  Admit date: 08/03/2019 Discharge date: 08/09/2019  Admitted From: HOME Disposition:  Home.   Recommendations for Outpatient Follow-up:  1. Follow up with PCP in 1-2 weeks 2. Please obtain BMP/CBC in one week   Discharge Condition:stable.  CODE STATUS: full code.  Diet recommendation: Heart Healthy  Brief/Interim Summary: Judy Meza is a 59 y.o. year old female with medical history significant for hepatitis C cirrhosis, dementia, tobacco use, prior history of alcohol abuse, COPD, pancytopenia, CKD stage III who presented on 08/03/2019 with 2 to 3 weeks of recurrent falls, weakness in legs, intermittent confusion, poor adherence to medications, low back pain.  Patient was evaluated at her PCPs office on 08/03/2019.  At that time her sister reported upwards of 6 falls in the past 2 weeks.  Patient was recently discharged from skilled nursing facility after a 30-day stay earlier in the month of July.  Patient lives at home by herself.  Sisters assist with medications, and meals and are concerned and was found to havepatient has not been taking her lactulose amongst other medications .  In conversation with her PCP at the time patient requiring assistance with basic ADLs.  At the time of evaluation patient was found to be quite lethargic concerning for failure to thrive versus moderate acute process and was sent to the ER for further evaluation.  Of note this is her third admission in the past 2 months  In the ED patient was afebrile, Placed on 2 L for SPO2 of 90%, blood pressure 163/121.  72, WBC 3.2, hemoglobin 9.4, platelets 66.  CO2 20, creatinine 1.31, AST 46, T bili 1.9.  UA unremarkable.  UDS unremarkable.  Lipase 75. Covid test negative. Chest x-ray nonacute, CT head with generalized cerebral atrophy with no acute changes. X-ray of thoracolumbar spine with no evidence of  acute fracture. CTA chest negative for PE, right lower lobe atelectasis, small amount of free peritoneal fluid.   [facility] service was called for further management of potential failure to thrive in setting of hepatic encephalopathy.  Patient was started on lactulose therapy, resumed her home medications   Discharge Diagnoses:  Principal Problem:   Acute hepatic encephalopathy Active Problems:   CKD (chronic kidney disease), stage III   COPD with chronic bronchitis (HCC)   Tobacco abuse   Alcoholic cirrhosis of liver with ascites (HCC)   Thrombocytopenia (HCC)   Cirrhosis of liver (HCC)   Pancytopenia, acquired ([place])   Metabolic acidosis   Dementia without behavioral disturbance ([place])   History of laryngeal cancer   Multiple falls at home   Recurrent falls   Back pain with radiculopathy  Hypoactive delirium, resolved.  Actively undergoing diabetic encephalopathy given improvement now that she is back on lactulose with no acute changes found on work-up mentioned previously in H&P.  Has intermittent slowed thinking, confusion over the past 2 to 3 weeks per family.  On exam alert and oriented to self, place, time.  At baseline does have dementia without behavioral disturbances and cerebral atrophy on CT head with nonacute changes.  TSH and B12 within normal limits, infectious work-up unremarkable to date.   -Lactulose therapy continue at current regimen   Increased weakness, recurrent falls at home.  Differential includes side effect of decompensated hep C cirrhosis (hepatic encephalopathy, possible SBP), B12 deficiency, polypharmacy, orthostatic hypotension.  Blood pressure within normal limits currently. Orthostatic vitals within normal limits, B12  normal,  -Discontinued Questran given high risk for drowsiness/fatigue that could be contributing -OK to resume home ativan, no longer confused -PT eval recommends SNF --Patient home alone does not have 24 hour assistance and has  had recurrent falls, unsafe discharge back home needs SNF, but pt refusing to go to SNF, wants to go home.   Acute hepatic encephalopathy, resolved.  In setting of poor adherence to lactulose therapy.  Patient inconsistently takes lactulose at home.  No current asterixis, mentation normal -Lactulose 20 g 3 times daily, home rifaximin, monitor bowel movements, goal 3 BMs daily -monitor mental status  Hep C cirrhosis, currently compensated.  Admitted with hepatic encephalopathy.  Only minimal ascites on right upper quadrant ultrasound, mentation back to baseline -Continue home Lasix/Aldactone -Continue home Epclusa  Dementia with mood disturbances, stable baseline 2-3 weeks ago alert and oriented x4 per family, and able to perform all ADLs on her own.  Now no longer to do so safely -Continue Paxil -Continue Namenda -PT recommends SNF, but pt refusing to go to SNF at this time and wants to go home.   Subacute low back pain with paresthesias and possible radiculopathy, stable.  Slightly worsened in the setting of recent fall.  X-ray of lumbar spine showed no acute fracture.  MRI lumbar spine non acute. No lower extremity weakness, but states tingling the lower extremities without any loss of strength, and asking for increase in tramadol for pain control. No saddle anesthesia, no urine/fecal incontinence   History of alcohol use. No signs of withdrawal. Outside of window -continue thiamine  GERD, stable -Continue PPI  CKD stage III, stable Creatinine at baseline -Avoid nephrotoxins, monitor BMP  Metabolic acidosis, chronic. Bicarb tab ondischarge.   Pancytopenia in setting of hep C cirrhosis and CKD, chronic stable No signs or symptoms of bleeding currently.  COPD with chronic bronchitis.  No hypoxia, on RA.   Tobacco abuse, persists.  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  Evaluated by ENT earlier this  month with instructions to quit smoking, and expand expectantly manage.  Followed by Dr. Elson Areas (oncology) dressing in place, no purulent drainage, CT chest unremarkable -Monitor      Discharge Instructions  Discharge Instructions    Diet - low sodium heart healthy   Complete by: As directed    Discharge instructions   Complete by: As directed    [place] PCP IN ONE WEEK.     Allergies as of 08/09/2019   No Known Allergies     Medication List    STOP taking these medications   cholestyramine 4 g packet Commonly known as: QUESTRAN     TAKE these medications   albuterol (2.5 MG/3ML) 0.083% nebulizer solution Commonly known as: PROVENTIL Take 3 mLs (2.5 mg total) by nebulization every 6 (six) hours as needed for wheezing or shortness of breath.   albuterol 108 (90 Base) MCG/ACT inhaler Commonly known as: VENTOLIN HFA Inhale 2 puffs into the lungs every 6 (six) hours as needed for wheezing or shortness of breath.   feeding supplement (ENSURE ENLIVE) Liqd Take 237 mLs by mouth 2 (two) times daily between meals.   furosemide 20 MG tablet Commonly known as: LASIX Take 20 mg by mouth daily.   gabapentin 100 MG capsule Commonly known as: NEURONTIN Take 200 mg by mouth 3 (three) times daily.   hydrOXYzine 10 MG tablet Commonly known as: ATARAX/VISTARIL Take 1 tablet (10 mg  total) by mouth 3 (three) times daily as needed for itching or anxiety.   lactulose 10 GM/15ML solution Commonly known as: CHRONULAC Take 30 mLs (20 g total) by mouth 3 (three) times daily.   loratadine 10 MG tablet Commonly known as: CLARITIN Take 1 tablet (10 mg total) by mouth daily.   LORazepam 0.5 MG tablet Commonly known as: ATIVAN Take 0.5 mg by mouth at bedtime.   memantine 5 MG tablet Commonly known as: NAMENDA Take 1 tablet (5 mg total) by mouth 2 (two) times daily.   pantoprazole 40 MG tablet Commonly known as: PROTONIX Take 40 mg by mouth daily.   PARoxetine  40 MG tablet Commonly known as: PAXIL Take 1 tablet (40 mg total) by mouth every morning.   rifaximin 550 MG Tabs tablet Commonly known as: XIFAXAN Take 1 tablet (550 mg total) by mouth 2 (two) times daily. What changed: when to take this   sodium bicarbonate 650 MG tablet Take 1 tablet (650 mg total) by mouth 2 (two) times daily.   Sofosbuvir-Velpatasvir 400-100 MG Tabs Commonly known as: Epclusa Take 1 tablet by mouth daily.   spironolactone 100 MG tablet Commonly known as: ALDACTONE Take 0.5 tablets (50 mg total) by mouth daily. What changed: how much to take   thiamine 100 MG tablet Take 1 tablet (100 mg total) by mouth daily.       Follow-up Information    Vevelyn Francois, NP Follow up in 1 week(s).   Specialty: Adult Health Nurse Practitioner Contact information: [address] [place] [address] [phone]              No Known Allergies  Consultations:  None.    Procedures/Studies: Barbaraann Faster SPINE  Result Date: 08/03/2019 CLINICAL DATA:  Multiple falls. EXAM: THORACOLUMBAR SPINE 1V COMPARISON:  None. FINDINGS: It should be noted that the T1 through T4 vertebral bodies are limited in evaluation secondary to image caught off. There is no evidence of acute fracture within the visualized portion of the thoracolumbar spine. There is normal alignment. Normal multilevel endplates and normal multilevel intervertebral disc spaces are seen. Mild calcification of the abdominal aorta is seen. IMPRESSION: Limited study, as described above, without evidence of acute fracture within the visualized portion of the thoracolumbar spine. Electronically Signed   By: Virgina Norfolk M.D.   On: 08/03/2019 22:09   CT Head Wo Contrast  Result Date: 08/03/2019 CLINICAL DATA:  Altered mental status with bilateral leg weakness. EXAM: CT HEAD WITHOUT CONTRAST TECHNIQUE: Contiguous axial images were obtained from the base of the skull through the vertex without  intravenous contrast. COMPARISON:  None. FINDINGS: Brain: There is mild cerebral atrophy with widening of the extra-axial spaces and ventricular dilatation. There are areas of decreased attenuation within the white matter tracts of the supratentorial brain, consistent with microvascular disease changes. Vascular: No hyperdense vessel or unexpected calcification. Skull: Normal. Negative for fracture or focal lesion. Sinuses/Orbits: No acute finding. Other: None. IMPRESSION: 1. Generalized cerebral atrophy. 2. No acute intracranial abnormality. Electronically Signed   By: Virgina Norfolk M.D.   On: 08/03/2019 19:28   CT ANGIO CHEST PE W OR WO CONTRAST  Result Date: 08/04/2019 CLINICAL DATA:  Cough. Episode of hypoxia this morning, improved after coughing up thick secretions. Negative D-dimer. EXAM: CT ANGIOGRAPHY CHEST WITH CONTRAST TECHNIQUE: Multidetector CT imaging of the chest was performed using the standard protocol during bolus administration of intravenous contrast. Multiplanar CT image reconstructions and MIPs were obtained to  evaluate the vascular anatomy. CONTRAST:  38mL OMNIPAQUE IOHEXOL 350 MG/ML SOLN COMPARISON:  Portable chest dated 08/03/2019 FINDINGS: Cardiovascular: Atheromatous calcifications, including the coronary arteries and aorta. Normally opacified pulmonary arteries with no pulmonary arterial filling defects seen. Mediastinum/Nodes: No enlarged mediastinal, hilar, or axillary lymph nodes. Thyroid gland, trachea, and esophagus demonstrate no significant findings. Lungs/Pleura: Right lower lobe atelectasis.  No pleural fluid. Upper Abdomen: Small amount of free peritoneal fluid. Musculoskeletal: Mild thoracic spine degenerative changes. Review of the MIP images confirms the above findings. IMPRESSION: 1. No pulmonary emboli. 2. Right lower lobe atelectasis. 3. Small amount of free peritoneal fluid. 4.  Calcific coronary artery and aortic atherosclerosis. Aortic Atherosclerosis  (ICD10-I70.0). Electronically Signed   By: Claudie Revering M.D.   On: 08/04/2019 08:33   MR THORACIC SPINE WO CONTRAST  Result Date: 08/07/2019 CLINICAL DATA:  59 year old female with back pain and leg weakness for 2-3 weeks. Recent falls, found down. A study without and with contrast was planned, but the patient declined IV contrast. EXAM: MRI THORACIC SPINE WITHOUT CONTRAST TECHNIQUE: Multiplanar, multisequence MR imaging of the thoracic spine was performed. No intravenous contrast was administered. COMPARISON:  Thoracolumbar radiographs 08/03/2019 FINDINGS: Limited cervical spine imaging: Negative aside from straightening of cervical lordosis. Thoracic spine segmentation:  Appears to be normal. Alignment: Mild straightening of thoracic kyphosis. No spondylolisthesis. Vertebrae: No marrow edema or evidence of acute osseous abnormality. Visualized bone marrow signal is within normal limits. Cord: Normal thoracic spinal cord. The conus medullaris occurs below T12-L1 and is not completely included. Capacious thoracic spinal canal. Paraspinal and other soft tissues: Negative. Disc levels: Capacious thoracic spinal canal with no age advanced degenerative changes, no spinal or foraminal stenosis. IMPRESSION: Normal for age non-contrast MRI appearance of the thoracic spine. The patient declined IV contrast. Electronically Signed   By: Genevie Ann M.D.   On: 08/07/2019 19:10   MR LUMBAR SPINE WO CONTRAST  Result Date: 08/07/2019 CLINICAL DATA:  59 year old female with back pain and leg weakness for 2-3 weeks. Recent falls, found down. EXAM: MRI LUMBAR SPINE WITHOUT CONTRAST TECHNIQUE: Multiplanar, multisequence MR imaging of the lumbar spine was performed. No intravenous contrast was administered. COMPARISON:  None. FINDINGS: The examination had to be discontinued prior to completion by patient request. Only axial and sagittal T2 weighted imaging was obtained, axial images somewhat motion degraded. Segmentation: Normal,  concordant with the thoracic numbering today. Vestigial S1-S2 disc space. Alignment:  Normal lumbar lordosis.  No spondylolisthesis. Vertebrae: T2 marrow signal is within normal limits. No osseous abnormality is evident. Visible sacrum and SI joints appear intact. Conus medullaris and cauda equina: Mildly degraded by motion but appears normal at the L1-L2 level. Capacious lumbar spinal canal. Paraspinal and other soft tissues: Negative. Disc levels: Capacious lumbar spinal canal with no age advanced degenerative changes, no lumbar spinal stenosis, no significant lumbar foraminal stenosis. IMPRESSION: Truncated exam, but unremarkable for age T2 weighted MRI images of the lumbar spine. Electronically Signed   By: Genevie Ann M.D.   On: 08/07/2019 19:14   DG Chest Portable 1 View  Result Date: 08/03/2019 CLINICAL DATA:  Found on the floor. EXAM: PORTABLE CHEST 1 VIEW COMPARISON:  Jun 04, 2019 FINDINGS: Overlying cardiac lead wires are seen. There is no evidence of acute infiltrate, pleural effusion or pneumothorax. The heart size and mediastinal contours are within normal limits. The visualized skeletal structures are unremarkable. IMPRESSION: No active disease. Electronically Signed   By: Virgina Norfolk M.D.   On: 08/03/2019 18:36  [place] ASCITES (ABDOMEN LIMITED)  Result Date: 08/04/2019 CLINICAL DATA:  Ascites EXAM: LIMITED ABDOMEN ULTRASOUND FOR ASCITES TECHNIQUE: Limited ultrasound survey for ascites was performed in all four abdominal quadrants. COMPARISON:  CT 08/04/2019 FINDINGS: Sonography of the 4 quadrants is performed. Cirrhotic morphology of the liver. Small amount of ascites adjacent to the liver. No significant fluid in the lower quadrants. IMPRESSION: 1. Only small amount of ascites in the right upper quadrant adjacent to the liver 2. Cirrhotic morphology of the liver Electronically Signed   By: Donavan Foil M.D.   On: 08/04/2019 17:53      Subjective: NO new complaints.   Discharge  Exam: Vitals:   08/09/19 0506 08/09/19 0755  BP: (!) 143/94   Pulse: 98 91  Resp: 20 16  Temp: 98.1 F (36.7 C)   SpO2: 99% 99%   Vitals:   08/08/19 1252 08/08/19 2151 08/09/19 0506 08/09/19 0755  BP: (!) 131/95 (!) 138/89 (!) 143/94   Pulse: 91 90 98 91  Resp: 16 20 20 16   Temp: 97.9 F (36.6 C) 98.4 F (36.9 C) 98.1 F (36.7 C)   TempSrc: Oral Oral Oral   SpO2: 96% 99% 99% 99%  Weight:      Height:        General: Pt is alert, awake, not in acute distress Cardiovascular: RRR, S1/S2 +, no rubs, no gallops Respiratory: CTA bilaterally, no wheezing, no rhonchi Abdominal: Soft, NT, ND, bowel sounds + Extremities: no edema, no cyanosis    The results of significant diagnostics from this hospitalization (including imaging, microbiology, ancillary and laboratory) are listed below for reference.     Microbiology: Recent Results (from the past 240 hour(s))  SARS Coronavirus 2 by RT PCR (hospital order, performed in [facility] hospital lab) Nasopharyngeal Nasopharyngeal Swab     Status: None   Collection Time: 08/03/19  5:52 PM   Specimen: Nasopharyngeal Swab  Result Value Ref Range Status   SARS Coronavirus 2 NEGATIVE NEGATIVE Final    Comment: (NOTE) SARS-CoV-2 target nucleic acids are NOT DETECTED.  The SARS-CoV-2 RNA is generally detectable in upper and lower respiratory specimens during the acute phase of infection. The lowest concentration of SARS-CoV-2 viral copies this assay can detect is 250 copies / mL. A negative result does not preclude SARS-CoV-2 infection and should not be used as the sole basis for treatment or other patient management decisions.  A negative result may occur with improper specimen collection / handling, submission of specimen other than nasopharyngeal swab, presence of viral mutation(s) within the areas targeted by this assay, and inadequate number of viral copies (<250 copies / mL). A negative result must be combined with  clinical observations, patient history, and epidemiological information.  Fact Sheet for Patients:   StrictlyIdeas.no  Fact Sheet for Healthcare Providers: BankingDealers.co.za  This test is not yet approved or  cleared by the [place] FDA and has been authorized for detection and/or diagnosis of SARS-CoV-2 by FDA under an Emergency Use Authorization (EUA).  This EUA will remain in effect (meaning this test can be used) for the duration of the COVID-19 declaration under Section 564(b)(1) of the Act, 21 U.S.C. section 360bbb-3(b)(1), unless the authorization is terminated or revoked sooner.  Performed at [facility], [place] [address] [place],  [address]      Labs: BNP (last 3 results) Recent Labs    04/30/19 0553  BNP 95.6   Basic Metabolic Panel: Recent Labs  Lab 08/03/19 1752 08/04/19 0453  08/05/19 0741  NA 136 135 139  K 4.6 4.2 4.5  CL 106 107 107  CO2 21* 20* 17*  GLUCOSE 93 77 123*  BUN 15 14 15   CREATININE 1.31* 1.21* 1.40*  CALCIUM 9.7 9.2 9.6   Liver Function Tests: Recent Labs  Lab 08/03/19 1752 08/04/19 0453 08/05/19 0741  AST 56* 46* 46*  ALT 38 32 32  ALKPHOS 54 49 54  BILITOT 1.9* 1.9* 1.2  PROT 7.8 6.7 6.7  ALBUMIN 3.3* 2.9* 2.8*   Recent Labs  Lab 08/03/19 1752  LIPASE 75*   Recent Labs  Lab 08/03/19 1752 08/04/19 0458  AMMONIA 72* 65*   CBC: Recent Labs  Lab 08/03/19 1752 08/04/19 0453 08/05/19 0741  WBC 2.2* 3.2* 4.0  NEUTROABS 1.1*  --  2.6  HGB 10.0* 9.4* 9.1*  HCT 31.5* 28.9* 27.9*  MCV 97.8 96.3 96.9  PLT 63* 66* 71*   Cardiac Enzymes: No results for input(s): CKTOTAL, CKMB, CKMBINDEX, TROPONINI in the last 168 hours. BNP: Invalid input(s): POCBNP CBG: No results for input(s): GLUCAP in the last 168 hours. D-Dimer No results for input(s): DDIMER in the last 72 hours. Hgb A1c No results for input(s): HGBA1C in the last 72  hours. Lipid Profile No results for input(s): CHOL, HDL, LDLCALC, TRIG, CHOLHDL, LDLDIRECT in the last 72 hours. Thyroid function studies No results for input(s): TSH, T4TOTAL, T3FREE, THYROIDAB in the last 72 hours.  Invalid input(s): FREET3 Anemia work up No results for input(s): VITAMINB12, FOLATE, FERRITIN, TIBC, IRON, RETICCTPCT in the last 72 hours. Urinalysis    Component Value Date/Time   COLORURINE STRAW (A) 08/03/2019 1855   APPEARANCEUR CLEAR 08/03/2019 1855   LABSPEC 1.009 08/03/2019 1855   PHURINE 8.0 08/03/2019 1855   GLUCOSEU NEGATIVE 08/03/2019 1855   HGBUR NEGATIVE 08/03/2019 1855   BILIRUBINUR NEGATIVE 08/03/2019 1855   KETONESUR NEGATIVE 08/03/2019 1855   PROTEINUR NEGATIVE 08/03/2019 1855   NITRITE NEGATIVE 08/03/2019 1855   LEUKOCYTESUR NEGATIVE 08/03/2019 1855   Sepsis Labs Invalid input(s): PROCALCITONIN,  WBC,  LACTICIDVEN Microbiology Recent Results (from the past 240 hour(s))  SARS Coronavirus 2 by RT PCR (hospital order, performed in [place] hospital lab) Nasopharyngeal Nasopharyngeal Swab     Status: None   Collection Time: 08/03/19  5:52 PM   Specimen: Nasopharyngeal Swab  Result Value Ref Range Status   SARS Coronavirus 2 NEGATIVE NEGATIVE Final    Comment: (NOTE) SARS-CoV-2 target nucleic acids are NOT DETECTED.  The SARS-CoV-2 RNA is generally detectable in upper and lower respiratory specimens during the acute phase of infection. The lowest concentration of SARS-CoV-2 viral copies this assay can detect is 250 copies / mL. A negative result does not preclude SARS-CoV-2 infection and should not be used as the sole basis for treatment or other patient management decisions.  A negative result may occur with improper specimen collection / handling, submission of specimen other than nasopharyngeal swab, presence of viral mutation(s) within the areas targeted by this assay, and inadequate number of viral copies (<250 copies / mL). A negative  result must be combined with clinical observations, patient history, and epidemiological information.  Fact Sheet for Patients:   StrictlyIdeas.no  Fact Sheet for Healthcare Providers: BankingDealers.co.za  This test is not yet approved or  cleared by the [place] FDA and has been authorized for detection and/or diagnosis of SARS-CoV-2 by FDA under an Emergency Use Authorization (EUA).  This EUA will remain in effect (meaning this test can be used) for  the duration of the COVID-19 declaration under Section 564(b)(1) of the Act, 21 U.S.C. section 360bbb-3(b)(1), unless the authorization is terminated or revoked sooner.  Performed at [facility], [place] [address], [place], [place] [address]      Time coordinating discharge: 23 MINUTES.   SIGNED:   Hosie Poisson, MD  [facility] 08/09/2019, 10:21 AM

## 2019-08-09 NOTE — Plan of Care (Signed)

## 2019-08-14 ENCOUNTER — Ambulatory Visit (INDEPENDENT_AMBULATORY_CARE_PROVIDER_SITE_OTHER): Payer: Medicaid Other | Admitting: Internal Medicine

## 2019-08-14 ENCOUNTER — Encounter: Payer: Self-pay | Admitting: Internal Medicine

## 2019-08-14 ENCOUNTER — Other Ambulatory Visit: Payer: Self-pay

## 2019-08-14 VITALS — BP 131/82 | HR 94 | Temp 98.2°F | Wt 125.0 lb

## 2019-08-14 DIAGNOSIS — B182 Chronic viral hepatitis C: Secondary | ICD-10-CM | POA: Diagnosis not present

## 2019-08-14 DIAGNOSIS — K7030 Alcoholic cirrhosis of liver without ascites: Secondary | ICD-10-CM

## 2019-08-14 DIAGNOSIS — Z129 Encounter for screening for malignant neoplasm, site unspecified: Secondary | ICD-10-CM | POA: Diagnosis not present

## 2019-08-14 NOTE — Progress Notes (Signed)
   Subjective:    Patient ID: Judy Meza, female    DOB: May 27, 1960, 59 y.o.   MRN: [id number]  HPI Here for follow up of chronic hepatitis C and decompensated cirrhosis.   She moved from [facility] this year to be near family who can help her.  She lives alone.  She was recently hospitalized again with hepatic encephalopathy.  She now is taking her lactulose well and no further issues, managed by her PCP.     Review of Systems  Constitutional: Negative for fatigue.  Skin: Negative for rash.       Objective:   Physical Exam Constitutional:      Appearance: Normal appearance.  Eyes:     General: No scleral icterus. Pulmonary:     Effort: Pulmonary effort is normal.  Neurological:     Mental Status: She is alert.  Psychiatric:        Mood and Affect: Mood normal.   SH: no current alcohol        Assessment & Plan:

## 2019-08-14 NOTE — Assessment & Plan Note (Signed)
She is doing well with the lactulose now.  Encouraged to continue this.

## 2019-08-14 NOTE — Assessment & Plan Note (Signed)
She is doing well with this and will continue with the Epclusa to complete 6 months.  Will check her HCV RNA today rtc 3 months

## 2019-08-14 NOTE — Assessment & Plan Note (Signed)
She recently had an ultrasound while hospitalized and no mass.  Will continue with [place] screening every 6 months.

## 2019-08-16 LAB — HEPATITIS C RNA QUANTITATIVE
HCV RNA, PCR, QN (Log): <1.18 log IU/mL
HCV RNA, PCR, QN: <15 IU/mL

## 2019-08-17 ENCOUNTER — Other Ambulatory Visit: Payer: Self-pay

## 2019-08-17 ENCOUNTER — Emergency Department (HOSPITAL_COMMUNITY)
Admission: EM | Admit: 2019-08-17 | Discharge: 2019-08-17 | Discharge status: Against Medical Advice | Payer: Medicaid Other

## 2019-08-21 ENCOUNTER — Telehealth: Payer: Self-pay | Admitting: Pharmacy Technician

## 2019-08-21 NOTE — Telephone Encounter (Signed)
[facility] Patient Advocate Encounter   Received notification from [place] that prior authorization for [place] is required for an additional 4 weeks.  We must submit request for 4 weeks at a time.  This will be for the fourth fill of 28 days (first 8 weeks was through patient assistance before MEDICAID approved.)   PA submitted on 08/21/2019 Key [id number] W Status is pending    [facility] will continue to follow.   [name]. Nadara Mustard [place] Patient [facility] Phone: [phone] Fax:  [phone]

## 2019-08-23 ENCOUNTER — Ambulatory Visit: Payer: Medicaid Other | Admitting: Neurology

## 2019-09-25 ENCOUNTER — Encounter: Payer: Self-pay | Admitting: Physician Assistant

## 2019-10-03 ENCOUNTER — Emergency Department (HOSPITAL_BASED_OUTPATIENT_CLINIC_OR_DEPARTMENT_OTHER): Payer: Medicaid Other

## 2019-10-03 ENCOUNTER — Inpatient Hospital Stay (HOSPITAL_COMMUNITY): Payer: Medicaid Other

## 2019-10-03 ENCOUNTER — Emergency Department (HOSPITAL_COMMUNITY): Payer: Medicaid Other

## 2019-10-03 ENCOUNTER — Encounter (HOSPITAL_COMMUNITY): Payer: Self-pay

## 2019-10-03 ENCOUNTER — Other Ambulatory Visit: Payer: Self-pay

## 2019-10-03 ENCOUNTER — Inpatient Hospital Stay (HOSPITAL_COMMUNITY)
Admission: EM | Admit: 2019-10-03 | Discharge: 2019-10-08 | DRG: 432 | Disposition: A | Discharge status: Home Health | Payer: Medicaid Other | Attending: Internal Medicine | Admitting: Internal Medicine

## 2019-10-03 DIAGNOSIS — Z6823 Body mass index (BMI) 23.0-23.9, adult: Secondary | ICD-10-CM

## 2019-10-03 DIAGNOSIS — K7460 Unspecified cirrhosis of liver: Secondary | ICD-10-CM | POA: Diagnosis present

## 2019-10-03 DIAGNOSIS — E871 Hypo-osmolality and hyponatremia: Secondary | ICD-10-CM | POA: Diagnosis present

## 2019-10-03 DIAGNOSIS — M79671 Pain in right foot: Secondary | ICD-10-CM | POA: Diagnosis present

## 2019-10-03 DIAGNOSIS — F1010 Alcohol abuse, uncomplicated: Secondary | ICD-10-CM | POA: Diagnosis present

## 2019-10-03 DIAGNOSIS — Z79899 Other long term (current) drug therapy: Secondary | ICD-10-CM

## 2019-10-03 DIAGNOSIS — Y92003 Bedroom of unspecified non-institutional (private) residence as the place of occurrence of the external cause: Secondary | ICD-10-CM

## 2019-10-03 DIAGNOSIS — F0390 Unspecified dementia without behavioral disturbance: Secondary | ICD-10-CM | POA: Diagnosis present

## 2019-10-03 DIAGNOSIS — K31819 Angiodysplasia of stomach and duodenum without bleeding: Secondary | ICD-10-CM | POA: Diagnosis present

## 2019-10-03 DIAGNOSIS — I129 Hypertensive chronic kidney disease with stage 1 through stage 4 chronic kidney disease, or unspecified chronic kidney disease: Secondary | ICD-10-CM | POA: Diagnosis present

## 2019-10-03 DIAGNOSIS — M25571 Pain in right ankle and joints of right foot: Secondary | ICD-10-CM | POA: Diagnosis present

## 2019-10-03 DIAGNOSIS — D649 Anemia, unspecified: Secondary | ICD-10-CM | POA: Diagnosis present

## 2019-10-03 DIAGNOSIS — N1831 Chronic kidney disease, stage 3a: Secondary | ICD-10-CM | POA: Diagnosis present

## 2019-10-03 DIAGNOSIS — W06XXXA Fall from bed, initial encounter: Secondary | ICD-10-CM | POA: Diagnosis present

## 2019-10-03 DIAGNOSIS — R600 Localized edema: Secondary | ICD-10-CM | POA: Diagnosis present

## 2019-10-03 DIAGNOSIS — K7030 Alcoholic cirrhosis of liver without ascites: Secondary | ICD-10-CM | POA: Diagnosis present

## 2019-10-03 DIAGNOSIS — R188 Other ascites: Secondary | ICD-10-CM

## 2019-10-03 DIAGNOSIS — Z930 Tracheostomy status: Secondary | ICD-10-CM

## 2019-10-03 DIAGNOSIS — M7989 Other specified soft tissue disorders: Secondary | ICD-10-CM

## 2019-10-03 DIAGNOSIS — D62 Acute posthemorrhagic anemia: Secondary | ICD-10-CM | POA: Diagnosis present

## 2019-10-03 DIAGNOSIS — I8511 Secondary esophageal varices with bleeding: Secondary | ICD-10-CM | POA: Diagnosis present

## 2019-10-03 DIAGNOSIS — R55 Syncope and collapse: Secondary | ICD-10-CM | POA: Diagnosis not present

## 2019-10-03 DIAGNOSIS — K222 Esophageal obstruction: Secondary | ICD-10-CM | POA: Diagnosis present

## 2019-10-03 DIAGNOSIS — R195 Other fecal abnormalities: Secondary | ICD-10-CM | POA: Diagnosis present

## 2019-10-03 DIAGNOSIS — N183 Chronic kidney disease, stage 3 unspecified: Secondary | ICD-10-CM | POA: Diagnosis not present

## 2019-10-03 DIAGNOSIS — F17210 Nicotine dependence, cigarettes, uncomplicated: Secondary | ICD-10-CM | POA: Diagnosis present

## 2019-10-03 DIAGNOSIS — N179 Acute kidney failure, unspecified: Secondary | ICD-10-CM | POA: Diagnosis present

## 2019-10-03 DIAGNOSIS — R64 Cachexia: Secondary | ICD-10-CM | POA: Diagnosis present

## 2019-10-03 DIAGNOSIS — B182 Chronic viral hepatitis C: Secondary | ICD-10-CM | POA: Diagnosis present

## 2019-10-03 DIAGNOSIS — D696 Thrombocytopenia, unspecified: Secondary | ICD-10-CM | POA: Diagnosis not present

## 2019-10-03 DIAGNOSIS — Z20822 Contact with and (suspected) exposure to covid-19: Secondary | ICD-10-CM | POA: Diagnosis present

## 2019-10-03 DIAGNOSIS — J449 Chronic obstructive pulmonary disease, unspecified: Secondary | ICD-10-CM | POA: Diagnosis present

## 2019-10-03 DIAGNOSIS — D61818 Other pancytopenia: Secondary | ICD-10-CM | POA: Diagnosis present

## 2019-10-03 DIAGNOSIS — R296 Repeated falls: Secondary | ICD-10-CM | POA: Diagnosis present

## 2019-10-03 DIAGNOSIS — I361 Nonrheumatic tricuspid (valve) insufficiency: Secondary | ICD-10-CM | POA: Diagnosis not present

## 2019-10-03 DIAGNOSIS — K7290 Hepatic failure, unspecified without coma: Secondary | ICD-10-CM | POA: Diagnosis not present

## 2019-10-03 DIAGNOSIS — J4489 Other specified chronic obstructive pulmonary disease: Secondary | ICD-10-CM | POA: Diagnosis present

## 2019-10-03 DIAGNOSIS — K7031 Alcoholic cirrhosis of liver with ascites: Secondary | ICD-10-CM | POA: Diagnosis not present

## 2019-10-03 DIAGNOSIS — Z8249 Family history of ischemic heart disease and other diseases of the circulatory system: Secondary | ICD-10-CM

## 2019-10-03 DIAGNOSIS — R682 Dry mouth, unspecified: Secondary | ICD-10-CM | POA: Diagnosis present

## 2019-10-03 DIAGNOSIS — D631 Anemia in chronic kidney disease: Secondary | ICD-10-CM | POA: Diagnosis not present

## 2019-10-03 DIAGNOSIS — Z8521 Personal history of malignant neoplasm of larynx: Secondary | ICD-10-CM | POA: Diagnosis not present

## 2019-10-03 LAB — CBC WITH DIFFERENTIAL/PLATELET
Abs Immature Granulocytes: 0.01 10*3/uL (ref 0.00–0.07)
Basophils Absolute: 0 10*3/uL (ref 0.0–0.1)
Basophils Relative: 0 %
Eosinophils Absolute: 0 10*3/uL (ref 0.0–0.5)
Eosinophils Relative: 1 %
HCT: 21.6 % — ABNORMAL LOW (ref 36.0–46.0)
Hemoglobin: 6.6 g/dL — CL (ref 12.0–15.0)
Immature Granulocytes: 0 %
Lymphocytes Relative: 23 %
Lymphs Abs: 0.8 10*3/uL (ref 0.7–4.0)
MCH: 29.6 pg (ref 26.0–34.0)
MCHC: 30.6 g/dL (ref 30.0–36.0)
MCV: 96.9 fL (ref 80.0–100.0)
Monocytes Absolute: 0.5 10*3/uL (ref 0.1–1.0)
Monocytes Relative: 13 %
Neutro Abs: 2.2 10*3/uL (ref 1.7–7.7)
Neutrophils Relative %: 63 %
Platelets: UNDETERMINED 10*3/uL (ref 150–400)
RBC: 2.23 MIL/uL — ABNORMAL LOW (ref 3.87–5.11)
RDW: 16.9 % — ABNORMAL HIGH (ref 11.5–15.5)
WBC: 3.6 10*3/uL — ABNORMAL LOW (ref 4.0–10.5)
nRBC: 0 % (ref 0.0–0.2)

## 2019-10-03 LAB — COMPREHENSIVE METABOLIC PANEL
ALT: 25 U/L (ref 0–44)
AST: 48 U/L — ABNORMAL HIGH (ref 15–41)
Albumin: 2.7 g/dL — ABNORMAL LOW (ref 3.5–5.0)
Alkaline Phosphatase: 66 U/L (ref 38–126)
Anion gap: 11 (ref 5–15)
BUN: 21 mg/dL — ABNORMAL HIGH (ref 6–20)
CO2: 21 mmol/L — ABNORMAL LOW (ref 22–32)
Calcium: 9.4 mg/dL (ref 8.9–10.3)
Chloride: 105 mmol/L (ref 98–111)
Creatinine, Ser: 1.72 mg/dL — ABNORMAL HIGH (ref 0.44–1.00)
GFR calc Af Amer: 38 mL/min — ABNORMAL LOW (ref >60)
GFR calc non Af Amer: 32 mL/min — ABNORMAL LOW (ref >60)
Glucose, Bld: 80 mg/dL (ref 70–99)
Potassium: 3.9 mmol/L (ref 3.5–5.1)
Sodium: 137 mmol/L (ref 135–145)
Total Bilirubin: 1.8 mg/dL — ABNORMAL HIGH (ref 0.3–1.2)
Total Protein: 6.7 g/dL (ref 6.5–8.1)

## 2019-10-03 LAB — URINALYSIS, ROUTINE W REFLEX MICROSCOPIC
Bilirubin Urine: NEGATIVE
Glucose, UA: NEGATIVE mg/dL
Hgb urine dipstick: NEGATIVE
Ketones, ur: NEGATIVE mg/dL
Leukocytes,Ua: NEGATIVE
Nitrite: NEGATIVE
Protein, ur: NEGATIVE mg/dL
Specific Gravity, Urine: 1.012 (ref 1.005–1.030)
pH: 7 (ref 5.0–8.0)

## 2019-10-03 LAB — I-STAT CHEM 8, ED
BUN: 21 mg/dL — ABNORMAL HIGH (ref 6–20)
Calcium, Ion: 1.12 mmol/L — ABNORMAL LOW (ref 1.15–1.40)
Chloride: 105 mmol/L (ref 98–111)
Creatinine, Ser: 1.8 mg/dL — ABNORMAL HIGH (ref 0.44–1.00)
Glucose, Bld: 78 mg/dL (ref 70–99)
HCT: 18 % — ABNORMAL LOW (ref 36.0–46.0)
Hemoglobin: 6.1 g/dL — CL (ref 12.0–15.0)
Potassium: 3.8 mmol/L (ref 3.5–5.1)
Sodium: 140 mmol/L (ref 135–145)
TCO2: 21 mmol/L — ABNORMAL LOW (ref 22–32)

## 2019-10-03 LAB — RAPID URINE DRUG SCREEN, HOSP PERFORMED
Amphetamines: NOT DETECTED
Barbiturates: NOT DETECTED
Benzodiazepines: NOT DETECTED
Cocaine: NOT DETECTED
Opiates: NOT DETECTED
Tetrahydrocannabinol: NOT DETECTED

## 2019-10-03 LAB — ETHANOL: Alcohol, Ethyl (B): <10 mg/dL (ref <10)

## 2019-10-03 LAB — PROTIME-INR
INR: 1.4 — ABNORMAL HIGH (ref 0.8–1.2)
Prothrombin Time: 16.5 seconds — ABNORMAL HIGH (ref 11.4–15.2)

## 2019-10-03 LAB — AMMONIA: Ammonia: 45 umol/L — ABNORMAL HIGH (ref 9–35)

## 2019-10-03 LAB — ABO/RH: ABO/RH(D): O POS

## 2019-10-03 LAB — PLATELET COUNT: Platelets: 70 10*3/uL — ABNORMAL LOW (ref 150–400)

## 2019-10-03 LAB — POC OCCULT BLOOD, ED: Fecal Occult Bld: POSITIVE — AB

## 2019-10-03 LAB — TSH: TSH: 2.086 u[IU]/mL (ref 0.350–4.500)

## 2019-10-03 LAB — CK: Total CK: 388 U/L — ABNORMAL HIGH (ref 38–234)

## 2019-10-03 LAB — BRAIN NATRIURETIC PEPTIDE: B Natriuretic Peptide: 67.1 pg/mL (ref 0.0–100.0)

## 2019-10-03 LAB — LACTIC ACID, PLASMA: Lactic Acid, Venous: 1.6 mmol/L (ref 0.5–1.9)

## 2019-10-03 LAB — SARS CORONAVIRUS 2 BY RT PCR (HOSPITAL ORDER, PERFORMED IN ~~LOC~~ HOSPITAL LAB): SARS Coronavirus 2: NEGATIVE

## 2019-10-03 LAB — PREPARE RBC (CROSSMATCH)

## 2019-10-03 MED ORDER — SODIUM CHLORIDE 0.9% IV SOLUTION: Freq: Once | INTRAVENOUS | Status: AC

## 2019-10-03 MED ORDER — SODIUM CHLORIDE 0.9 % IV SOLN
50.0000 ug/h | INTRAVENOUS | Status: AC
  Administered 2019-10-03 – 2019-10-06 (×5): 50 ug/h via INTRAVENOUS
  Filled 2019-10-03 (×9): qty 1

## 2019-10-03 MED ORDER — ALBUTEROL SULFATE (2.5 MG/3ML) 0.083% IN NEBU: 2.5000 mg | INHALATION_SOLUTION | Freq: Four times a day (QID) | RESPIRATORY_TRACT | Status: DC | PRN

## 2019-10-03 MED ORDER — THIAMINE HCL 100 MG PO TABS
100.0000 mg | ORAL_TABLET | Freq: Every day | ORAL | Status: DC
  Administered 2019-10-03 – 2019-10-08 (×6): 100 mg via ORAL
  Filled 2019-10-03 (×7): qty 1

## 2019-10-03 MED ORDER — SOFOSBUVIR-VELPATASVIR 400-100 MG PO TABS
1.0000 | ORAL_TABLET | Freq: Every day | ORAL | Status: DC
  Administered 2019-10-05 – 2019-10-08 (×4): 1 via ORAL
  Filled 2019-10-03 (×4): qty 1

## 2019-10-03 MED ORDER — ADULT MULTIVITAMIN W/MINERALS CH
1.0000 | ORAL_TABLET | Freq: Every day | ORAL | Status: DC
  Administered 2019-10-05 – 2019-10-08 (×4): 1 via ORAL
  Filled 2019-10-03 (×6): qty 1

## 2019-10-03 MED ORDER — PANTOPRAZOLE SODIUM 40 MG IV SOLR
40.0000 mg | INTRAVENOUS | Status: AC
  Administered 2019-10-03 – 2019-10-05 (×3): 40 mg via INTRAVENOUS
  Filled 2019-10-03 (×3): qty 40

## 2019-10-03 MED ORDER — ACETAMINOPHEN 650 MG RE SUPP: 650.0000 mg | Freq: Four times a day (QID) | RECTAL | Status: DC | PRN

## 2019-10-03 MED ORDER — SODIUM CHLORIDE 0.9 % IV SOLN
2.0000 g | INTRAVENOUS | Status: DC
  Administered 2019-10-03 – 2019-10-04 (×2): 2 g via INTRAVENOUS
  Filled 2019-10-03 (×3): qty 20

## 2019-10-03 MED ORDER — FUROSEMIDE 10 MG/ML IJ SOLN
40.0000 mg | Freq: Two times a day (BID) | INTRAMUSCULAR | Status: DC
  Administered 2019-10-04 – 2019-10-05 (×3): 40 mg via INTRAVENOUS
  Filled 2019-10-03 (×4): qty 4

## 2019-10-03 MED ORDER — RIFAXIMIN 550 MG PO TABS
550.0000 mg | ORAL_TABLET | Freq: Every day | ORAL | Status: DC
  Administered 2019-10-03 – 2019-10-08 (×6): 550 mg via ORAL
  Filled 2019-10-03 (×6): qty 1

## 2019-10-03 MED ORDER — SPIRONOLACTONE 25 MG PO TABS
50.0000 mg | ORAL_TABLET | Freq: Every day | ORAL | Status: DC
  Administered 2019-10-03 – 2019-10-08 (×6): 50 mg via ORAL
  Filled 2019-10-03 (×6): qty 2

## 2019-10-03 MED ORDER — LACTULOSE 10 GM/15ML PO SOLN
20.0000 g | Freq: Three times a day (TID) | ORAL | Status: DC
  Administered 2019-10-04 – 2019-10-07 (×7): 20 g via ORAL
  Filled 2019-10-03 (×10): qty 30

## 2019-10-03 MED ORDER — FOLIC ACID 1 MG PO TABS
1.0000 mg | ORAL_TABLET | Freq: Every day | ORAL | Status: DC
  Administered 2019-10-04 – 2019-10-08 (×5): 1 mg via ORAL
  Filled 2019-10-03 (×5): qty 1

## 2019-10-03 MED ORDER — FUROSEMIDE 10 MG/ML IJ SOLN
40.0000 mg | Freq: Once | INTRAMUSCULAR | Status: AC
  Administered 2019-10-03: 40 mg via INTRAVENOUS
  Filled 2019-10-03: qty 4

## 2019-10-03 MED ORDER — THIAMINE HCL 100 MG/ML IJ SOLN
100.0000 mg | Freq: Every day | INTRAMUSCULAR | Status: DC
  Filled 2019-10-03: qty 2

## 2019-10-03 MED ORDER — ACETAMINOPHEN 325 MG PO TABS: 650.0000 mg | ORAL_TABLET | Freq: Four times a day (QID) | ORAL | Status: DC | PRN

## 2019-10-03 NOTE — Progress Notes (Signed)
Lower extremity venous bilateral study completed.  Preliminary results relayed to [place], MD.   See CV Proc for preliminary results report.   Darlin Coco, RDMS

## 2019-10-03 NOTE — ED Provider Notes (Signed)
Signout note  59 year old lady with Hep C cirrhosis, COPD, laryngeal cancer, CKD, dementia, previous substance abuse presenting to ER with increased confusion, swelling, fall.  Lab work ordered, CT head ordered to rule out acute traumatic pathology.  Anticipate admission.  Hemoglobin low, no gross blood or melena on rectal exam.  Received signout from Dr. Rex Kras  Reviewed labs, significant anemia, ammonia is somewhat elevated but not profoundly elevated, thrombocytopenia.  Suspect symptoms most likely related to her liver disease.  CT head and C-spine were negative for acute traumatic pathology.  Reassessed patient; she is alert, following commands, vitals remain stable. Will give patient unit of blood and dose of Lasix.  Consulted internal medicine for admission.   Judy Starch, MD 10/03/19 2047

## 2019-10-03 NOTE — H&P (Addendum)
Date: 10/03/2019               Patient Name:  Judy Meza MRN: [id number]  DOB: 04-27-1960 Age / Sex: 59 y.o., female   PCP: Oris Drone, Mendel Corning, MD         Medical Service: Internal Medicine Teaching Service         Attending Physician: Dr. Rebeca Alert Raynaldo Opitz, MD    First Contact: Dr. Jeralyn Bennett Pager: [phone]  Second Contact: Dr. Linna Hoff, MD  Pager: [phone]       After Hours (After 5p/  First Contact Pager: [phone]  weekends / holidays): Second Contact Pager: [phone]   Chief Complaint: Fall/Confusion  History of Present Illness:  Judy Meza is a 59 year old female with PMH of Hep C cirrhosis, COPD, laryngeal cancer, CKD, dementia, anemia, and previous substance abuse presenting who presented to the ED with increased confusion after an unwitnessed fall. At the bedside, patient had difficulty speaking due to her trach but is able to answer yes/no questions and follow directions. She mentions that she was in her usual state of health until she tried to get out of her bed. She fell on the floor and was not been to able to pick herself up. She mentions endorsing some pain of her legs with R leg being more painful than the left. She denies any abdominal pain, nausea, vomiting, chest pain, palpitations, fever, chills, BRBPR, melena, dizziness, light-headedness, hemoptysis, or hematemesis or hematuria.  Off note, patient's sister Judy Meza) was called to gather more information. According to sister, patient was found on her floor by her other sister so they [facility] to [facility] doctor who recommending sending patient to the ED. Patient lives by herself but gets help from nurse aids and family with ADLs. Sister states patient has had drops in her hgb in the past and was transfused twice last year in [place]. Three weeks ago, patient had a nosebleed at [facility] and her nephrologist recommended patient get evaluated for low hgb and platelet but her  [facility] wanted to watch her and eventually set up an GI appointment for her on Oct 20th. Patient was sent to a nursing facility for medication management and after discharge, she was doing well at home and walking sometimes w/o her walker. For the last 3 days, patient has been eating a lot of food with sodium and not drinking enough fluids. Patient has mild dementia but she is usually "with it". She is having about 3 bowel movements a day while on the lactulose and had 1.5 L of fluid drained from her belly in March 2021 in [place].   Meds:  No outpatient medications have been marked as taking for the 10/03/19 encounter [facility] Encounter).    Allergies: Allergies as of 10/03/2019  . (No Known Allergies)   Past Medical History:  Diagnosis Date  . Cancer ([place])   . COPD (chronic obstructive pulmonary disease) ([place])   . 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 Hypertension   . Liver disease   . Renal disorder     Family History: No significant family history  Social History: Patient lives by herself at home but goes to [facility], has an aid that comes to the house weekly by family members to check on her daily. 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 Patient also has a history of alcohol use.  Review of Systems: A complete ROS was negative except as per HPI.   Physical Exam: Blood pressure (!) 123/98, pulse 89, temperature 98.3 F (36.8 C), temperature source Oral, resp. rate 17, height 5\' 2"  (1.575 m), weight 64.3 kg, SpO2 100 %.  General: Chronically ill-appearing elderly female with trach collar laying peacefully in bed. No acute distress. Head: Normocephalic. Atraumatic. CV: RRR. No murmurs, rubs, or gallops. 1+ bilateral pitting edema to the knees. Pulmonary: Congested lung sounds on anterior aspect of chest. Normal effort. No wheezing or rales. Abdominal: Soft, nontender. Moderately distended and tympanic abdomen. Normal bowel  sounds. Extremities: Palpable pulses.  Mild tenderness to palpation of the right foot Skin: Warm and dry. No obvious rash or lesions. Neuro: A&Ox3. Moves all extremities. Normal sensation. No focal deficit. Psych: Normal mood and affect  Assessment & Plan by Problem: Active Problems:   Decompensated hepatic cirrhosis [facility])  Judy Meza is a 59 year old female with PMH of Hep C cirrhosis, COPD, laryngeal cancer, CKD, dementia, anemia, and previous substance abuse presenting who presented to the ED with encephalopathy after an unwitnessed fall and found to have acute on chronic anemia likely 2/2 to variceal bleed. Pending GI eval.  #Symptomatic Acute on chronic anemia Patient was found to have a hgb of 6.6 on arrival to the ED after being found down at home. Baseline hgb is around 9. Per sister, pt was transfused twice in [place] last year. Current drop in hgb concerning for a variceal bleed in the setting of patient's cirrhosis. Positive fecal occult blood. Platelet count low but stable at 70. S/p 1 unit of pRBC in the ED. Will consult GI to assess the utility of an EGD for further work up of variceal bleed.  --GI consulted, appreciate recs --IV Octreotide 50 mcg/hr --IV Ceftriaxone 2 g 200 mL/hr --IV protonix 40 daily --F/u post transfusion H & H --Type and screen  #Decompensated cirrhosis  Patient with history of cirrhosis secondary to hep C infection and chronic alcohol abuse presented with encephalopathy and weakness after an unwitnessed fall at home. Found to have an acute drop in her hgb to 6.6 on admission. Exam significant for nasarca and bilateral LE edema, but no ascites. Ammonia still elevated at 45 but improve from 65 two months ago. On lactulose and rifaximin at home. Mental status improved. Normal white count. --GI consulted, will see in the AM --Sofosbuvir-Velpatasvir 400-100 mg daily --Ritaximin 550 mg po daily --Lactulose 20 g po TID --Spinolactone 50 mg po  daily --Thiamine 242 mg daily --Folic acid 1 mg daily --IV Lasix 40 mg BID --F/u repeat CK, pro-INR --F/u AM CMP  #Fall Patient sustained an unwitnessed fall at home. Patient had loss of consciousness but denied hitting head. CT head neg for acute intracranial pathology. CT cervical spine and Xray R foot neg for fractures.  --Tylenol 650 mg q6h prn for pain --Fall precautions --PT/OT eval and treat  #CKD III Patient follows up with [facility]. BUN/sCr of 21/1.72 on admission up from 15/1.40 one month ago. Patient is on lasix 40 mg at home, but unsure if she has been taking it consistently. Found to have 1+ pitting edema to the knees on exam.  --Continue IV lasix 40 mg BID --Strict I & Os --F/u AM BMP  #Dementia Patient found to have increased confusion on admission. Patient A&Ox4 on exam. Patient at high risk for delirium. Reorient as needed.  --Delirium precautions  #Laryngeal cancer s/p tracheostomy Currently in remission. Mild congestion on auscultation  of anterior chest. Will suction as needed.  --Trach care --Pulse Ox  #Hx of alcohol use Ethanol level was normal on admission.  --CIWA w/o ativan   CODE STATUS: Full Code DIET: NPO PPx: SCDs  Dispo: Admit patient to Inpatient with expected length of stay greater than 2 midnights.  Signed: Lacinda Axon, MD 10/03/2019, 9:11 PM  Pager: [phone] Internal Medicine Teaching Service After 5pm on weekdays and 1pm on weekends: On Call pager: [phone]

## 2019-10-03 NOTE — ED Provider Notes (Addendum)
[facility] EMERGENCY DEPARTMENT Provider Note   CSN: [id number] Arrival date & time: 10/03/19  1314     History Chief Complaint  Patient presents with  . Fall  . Weakness    Judy Meza is a 59 y.o. female.  59yo F w/ PMH below including Hep C cirrhosis, COPD, laryngeal cancer, CKD, dementia, previous substance abuse who p/w fall and swelling. Pt went to outpatient clinic, [facility], on 9/20 reporting increased BLE edema, abdominal swelling, 19 lb weight gain in the past 6 weeks. She was given IM lasix, told to increase lasix at home, and labs were ordered. Today, sister found the patient on the floor at home. She took patient back to [facility] and they referred her to ED due to worsening confusion/somnolence, continued swelling, and fall. She had been given dose of lactulose yesterday but no BM yet. Pt has endorsed R foot/ankle pain.   LEVEL 5 CAVEAT DUE TO AMS  The history is provided by the EMS personnel and a relative.  Fall  Weakness      Past Medical History:  Diagnosis Date  . Cancer ([place])   . COPD (chronic obstructive pulmonary disease) ([place])   . 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 Hypertension   . Liver disease   . Renal disorder     Patient Active Problem List   Diagnosis Date Noted  . Cancer screening 08/14/2019  . Back pain with radiculopathy 08/07/2019  . Recurrent falls 08/04/2019  . Multiple falls at home 08/03/2019  . 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 Goals of care, counseling/discussion 07/28/2019  . Acute encephalopathy 06/05/2019  . Chest pain 06/04/2019  . QT prolongation 06/04/2019  . Metabolic acidosis 98/92/1194  . Dementia without behavioral disturbance ([place]) 05/10/2019  . Chronic hepatitis C without hepatic coma ([place]) 05/10/2019  . Hepatic encephalopathy ([place]) 05/09/2019  . Pancytopenia, acquired ([place])   . Cirrhosis of liver ([place])   . CKD (chronic kidney disease), stage III 04/29/2019  . COPD  with chronic bronchitis ([place]) 04/29/2019  . Tobacco abuse 04/29/2019  . Alcoholism in remission ([place]) 04/29/2019  . Acute hepatic encephalopathy 04/29/2019  . AKI (acute kidney injury) ([place]) 04/29/2019  . Alcoholic cirrhosis of liver with ascites ([place]) 04/29/2019  . Thrombocytopenia ([place]) 04/29/2019  . Hyperkalemia 04/29/2019    Past Surgical History:  Procedure Laterality Date  . PORT-A-CATH REMOVAL       OB History    Gravida  2   Para      Term      Preterm      AB      Living  1     SAB      TAB      Ectopic      Multiple      Live Births              Family History  Problem Relation Age of Onset  . Healthy Mother   . Hypertension Other     Social History   Tobacco Use  . Smoking status: Current Every Day Smoker    Packs/day: 0.25    Types: Cigarettes  . Smokeless tobacco: Never Used  . Tobacco comment: Willing to work on smoking cessation  Vaping Use  . Vaping Use: Never used  Substance Use Topics  . Alcohol use: Not Currently  . Drug use: Not Currently    Home Medications Prior to Admission medications   Medication Sig  Start Date End Date Taking? Authorizing Provider  albuterol (PROVENTIL) (2.5 MG/3ML) 0.083% nebulizer solution Take 3 mLs (2.5 mg total) by nebulization every 6 (six) hours as needed for wheezing or shortness of breath. 06/01/19   Vevelyn Francois, NP  albuterol (VENTOLIN HFA) 108 (90 Base) MCG/ACT inhaler Inhale 2 puffs into the lungs every 6 (six) hours as needed for wheezing or shortness of breath. 06/01/19 08/30/19  Vevelyn Francois, NP  feeding supplement, ENSURE ENLIVE, (ENSURE ENLIVE) LIQD Take 237 mLs by mouth 2 (two) times daily between meals. 05/02/19   Eugenie Filler, MD  furosemide (LASIX) 20 MG tablet Take 20 mg by mouth daily.     [provider]  gabapentin (NEURONTIN) 100 MG capsule Take 200 mg by mouth 3 (three) times daily.    [provider]  hydrOXYzine (ATARAX/VISTARIL) 10 MG tablet Take  1 tablet (10 mg total) by mouth 3 (three) times daily as needed for itching or anxiety. 06/01/19 11/19/21  Vevelyn Francois, NP  lactulose (CHRONULAC) 10 GM/15ML solution Take 30 mLs (20 g total) by mouth 3 (three) times daily. 06/01/19   Vevelyn Francois, NP  loratadine (CLARITIN) 10 MG tablet Take 1 tablet (10 mg total) by mouth daily. 06/01/19 08/30/19  Vevelyn Francois, NP  LORazepam (ATIVAN) 0.5 MG tablet Take 0.5 mg by mouth at bedtime.     [provider]  memantine (NAMENDA) 5 MG tablet Take 1 tablet (5 mg total) by mouth 2 (two) times daily. 06/01/19 08/30/19  Vevelyn Francois, NP  pantoprazole (PROTONIX) 40 MG tablet Take 40 mg by mouth daily.    [provider]  PARoxetine (PAXIL) 40 MG tablet Take 1 tablet (40 mg total) by mouth every morning. 06/01/19 08/30/19  Vevelyn Francois, NP  rifaximin (XIFAXAN) 550 MG TABS tablet Take 1 tablet (550 mg total) by mouth 2 (two) times daily. Patient taking differently: Take 550 mg by mouth daily.  06/09/19   Vevelyn Francois, NP  sodium bicarbonate 650 MG tablet Take 1 tablet (650 mg total) by mouth 2 (two) times daily. 08/09/19   Hosie Poisson, MD  Sofosbuvir-Velpatasvir (EPCLUSA) 400-100 MG TABS Take 1 tablet by mouth daily. 07/12/19   Kuppelweiser, Cassie L, RPH-CPP  spironolactone (ALDACTONE) 100 MG tablet Take 0.5 tablets (50 mg total) by mouth daily. Patient taking differently: Take 100 mg by mouth daily.  05/11/19   Oretha Milch D, MD  thiamine 100 MG tablet Take 1 tablet (100 mg total) by mouth daily. 06/08/19   Mendel Corning, MD    Allergies    Patient has no known allergies.  Review of Systems   Review of Systems  Unable to perform ROS: Mental status change  Neurological: Positive for weakness.    Physical Exam Updated Vital Signs BP 139/85   Pulse 99   Temp 99.2 F (37.3 C) (Oral)   Resp 15   Ht 5\' 2"  (1.575 m)   Wt 64.3 kg   SpO2 94%   BMI 25.94 kg/m   Physical Exam Vitals and nursing note reviewed. Exam conducted  with a chaperone present.  Constitutional:      General: She is not in acute distress.    Appearance: She is well-developed.     Comments: Sleepy, chronically ill appearing  HENT:     Head: Normocephalic and atraumatic.  Eyes:     Conjunctiva/sclera: Conjunctivae normal.     Pupils: Pupils are equal, round, and reactive to light.  Neck:  Comments: Stoma in place Cardiovascular:     Rate and Rhythm: Normal rate and regular rhythm.     Heart sounds: Normal heart sounds. No murmur heard.   Pulmonary:     Effort: Pulmonary effort is normal.     Comments: Coarse breath sounds b/l Abdominal:     General: Bowel sounds are normal. There is distension (moderate ).     Palpations: Abdomen is soft.     Tenderness: There is no abdominal tenderness.  Genitourinary:    Comments: Normal rectal tone, no gross blood or melena Musculoskeletal:     Cervical back: Neck supple.     Right lower leg: Edema present.     Left lower leg: Edema present.     Comments: Pitting edema BLE R>L; generalized TTP L lower leg, foot, and ankle without obvious deformity; scars on L lower leg  Skin:    General: Skin is warm and dry.  Neurological:     Comments: Disoriented, falls asleep during conversation and not cooperative with exam, responds to painful stimuli  Psychiatric:        Judgment: Judgment normal.     ED Results / Procedures / Treatments   Labs (all labs ordered are listed, but only abnormal results are displayed) Labs Reviewed  COMPREHENSIVE METABOLIC PANEL  CBC WITH DIFFERENTIAL/PLATELET  LACTIC ACID, PLASMA  BRAIN NATRIURETIC PEPTIDE  URINALYSIS, ROUTINE W REFLEX MICROSCOPIC  RAPID URINE DRUG SCREEN, HOSP PERFORMED  ETHANOL  TSH  PROTIME-INR  CK  AMMONIA  I-STAT CHEM 8, ED    EKG EKG Interpretation  Date/Time:  Tuesday October 03 2019 13:27:06 EDT Ventricular Rate:  97 PR Interval:  156 QRS Duration: 72 QT Interval:  388 QTC Calculation: 492 R Axis:   59 Text  Interpretation: Normal sinus rhythm Prolonged QT Abnormal ECG similar to previous Confirmed by Theotis Burrow [phone]) on 10/03/2019 1:48:53 PM   Radiology DG Tibia/Fibula Right  Result Date: 10/03/2019 CLINICAL DATA:  Right lower leg pain and swelling after recent fall EXAM: RIGHT TIBIA AND FIBULA - 2 VIEW COMPARISON:  None. FINDINGS: There is no evidence of fracture. No malalignment. No suspicious bone lesion. Mild diffuse soft tissue edema. No focal soft tissue swelling. Scattered vascular calcifications. IMPRESSION: 1. No acute osseous abnormality of the right tibia or fibula. 2. Mild diffuse soft tissue edema, nonspecific. Electronically Signed   By: Davina Poke D.O.   On: 10/03/2019 14:16   DG Foot Complete Right  Result Date: 10/03/2019 CLINICAL DATA:  Right foot pain and swelling after recent fall EXAM: RIGHT FOOT COMPLETE - 3+ VIEW COMPARISON:  None. FINDINGS: No acute fracture or dislocation within the right foot. Joint spaces are relatively preserved. No erosion. Scattered vascular calcifications. No focal soft tissue swelling. IMPRESSION: No acute fracture or dislocation within the right foot. Electronically Signed   By: Davina Poke D.O.   On: 10/03/2019 14:14   VAS [place] LOWER EXTREMITY VENOUS (DVT) (ONLY [facility] & [facility] 7a-7p)  Result Date: 10/03/2019  Lower Venous DVTStudy Indications: Swelling bilateral RT>LT.  Risk Factors: Trauma S/P fall. Comparison Study: No prior studies. Performing Technologist: Darlin Coco  Examination Guidelines: A complete evaluation includes B-mode imaging, spectral Doppler, color Doppler, and power Doppler as needed of all accessible portions of each vessel. Bilateral testing is considered an integral part of a complete examination. Limited examinations for reoccurring indications may be performed as noted. The reflux portion of the exam is performed with the patient in reverse Trendelenburg.   +---------+---------------+---------+-----------+----------+--------------+ RIGHT  CompressibilityPhasicitySpontaneityPropertiesThrombus Aging +---------+---------------+---------+-----------+----------+--------------+ CFV      Full           Yes      Yes                                 +---------+---------------+---------+-----------+----------+--------------+ SFJ      Full                                                        +---------+---------------+---------+-----------+----------+--------------+ FV Prox  Full                                                        +---------+---------------+---------+-----------+----------+--------------+ FV Mid   Full                                                        +---------+---------------+---------+-----------+----------+--------------+ FV DistalFull                                                        +---------+---------------+---------+-----------+----------+--------------+ PFV      Full                                                        +---------+---------------+---------+-----------+----------+--------------+ POP      Full           Yes      Yes                                 +---------+---------------+---------+-----------+----------+--------------+ PTV      Full                                                        +---------+---------------+---------+-----------+----------+--------------+ PERO     Full                                                        +---------+---------------+---------+-----------+----------+--------------+   +---------+---------------+---------+-----------+---------------+-------------+ LEFT     CompressibilityPhasicitySpontaneityProperties     Thrombus  Aging         +---------+---------------+---------+-----------+---------------+-------------+ CFV      Full           No        Yes        Abnormal                                                                 waveform                     +---------+---------------+---------+-----------+---------------+-------------+ SFJ      Full                                                            +---------+---------------+---------+-----------+---------------+-------------+ FV Prox  Full                                                            +---------+---------------+---------+-----------+---------------+-------------+ FV Mid   Full                                                            +---------+---------------+---------+-----------+---------------+-------------+ FV DistalFull                                                            +---------+---------------+---------+-----------+---------------+-------------+ PFV      Full                                                            +---------+---------------+---------+-----------+---------------+-------------+ POP      Full           Yes      Yes                                     +---------+---------------+---------+-----------+---------------+-------------+ PTV      Full                                                            +---------+---------------+---------+-----------+---------------+-------------+ PERO     Full                                                            +---------+---------------+---------+-----------+---------------+-------------+  Summary: RIGHT: - There is no evidence of deep vein thrombosis in the lower extremity.  - No cystic structure found in the popliteal fossa.  LEFT: - There is no evidence of deep vein thrombosis in the lower extremity.  - No cystic structure found in the popliteal fossa.  *See table(s) above for measurements and observations.    Preliminary     Procedures Procedures (including critical care time) CRITICAL CARE Performed by: Wenda Overland  Jaliya Siegmann   Total critical care time: 30 minutes  Critical care time was exclusive of separately billable procedures and treating other patients.  Critical care was necessary to treat or prevent imminent or life-threatening deterioration.  Critical care was time spent personally by me on the following activities: development of treatment plan with patient and/or surrogate as well as nursing, discussions with consultants, evaluation of patient's response to treatment, examination of patient, obtaining history from patient or surrogate, ordering and performing treatments and interventions, ordering and review of laboratory studies, ordering and review of radiographic studies, pulse oximetry and re-evaluation of patient's condition.  Medications Ordered in ED Medications - No data to display  ED Course  I have reviewed the triage vital signs and the nursing notes.  Pertinent labs & imaging results that were available during my care of the patient were reviewed by me and considered in my medical decision making (see chart for details).    MDM Rules/Calculators/A&P                          Pt confused, encephalopathic on exam, reassuring vital signs.  Significant lower extremity edema and some abdominal distention but without focal abdominal tenderness.  Obtained bilateral lower extremity ultrasounds which were negative for DVT.  Plain films of right lower extremity negative acute.  CT head and cervical spine pending given the patient's fall.  I have ordered lab work to evaluate for her altered mental status; differential diagnosis is broad and includes hepatic encephalopathy, infectious process, intracranial bleed. Pt signed out pending w/u and likely admission.   Of note, istat chem 8 shows hemoglobin 6.1. No gross blood or melena on rectal exam. Will await CBC prior to ordering pRBC.  Final Clinical Impression(s) / ED Diagnoses Final diagnoses:  None    Rx / DC Orders ED Discharge Orders     None       Gage Treiber, Wenda Overland, MD 10/03/19 1456    Damere Brandenburg, Wenda Overland, MD 10/03/19 1524

## 2019-10-03 NOTE — ED Triage Notes (Signed)
Pt presents [facility] ED from [facility]. She was evaluated yesterday at [facility] and found to have edema from waist down. Injection of lasix was given and home dose increased before pt was sent home. This morning pt was found on floor by sister who took her back to [facility]. [facility] sent to ED today for further management. PT denies hitting head. She endorses right foot pain.  Additional dose of lactulose given at [facility].
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
## 2019-10-03 NOTE — ED Notes (Signed)
Charisse Klinefelter guardian [phone] would like to speak with someone about the pt

## 2019-10-03 NOTE — ED Notes (Signed)
Patient transported to CT 

## 2019-10-03 NOTE — ED Notes (Signed)
Admitting at beside

## 2019-10-04 ENCOUNTER — Inpatient Hospital Stay (HOSPITAL_COMMUNITY): Payer: Medicaid Other | Admitting: Anesthesiology

## 2019-10-04 ENCOUNTER — Encounter (HOSPITAL_COMMUNITY)
Admission: EM | Disposition: A | Discharge status: Home Health | Payer: Self-pay | Source: Home / Self Care | Attending: Internal Medicine

## 2019-10-04 ENCOUNTER — Encounter (HOSPITAL_COMMUNITY): Payer: Self-pay | Admitting: Internal Medicine

## 2019-10-04 ENCOUNTER — Inpatient Hospital Stay (HOSPITAL_COMMUNITY): Payer: Medicaid Other

## 2019-10-04 DIAGNOSIS — F0390 Unspecified dementia without behavioral disturbance: Secondary | ICD-10-CM

## 2019-10-04 DIAGNOSIS — N183 Chronic kidney disease, stage 3 unspecified: Secondary | ICD-10-CM

## 2019-10-04 DIAGNOSIS — Z930 Tracheostomy status: Secondary | ICD-10-CM

## 2019-10-04 DIAGNOSIS — R55 Syncope and collapse: Secondary | ICD-10-CM

## 2019-10-04 DIAGNOSIS — Z9181 History of falling: Secondary | ICD-10-CM

## 2019-10-04 DIAGNOSIS — I8511 Secondary esophageal varices with bleeding: Secondary | ICD-10-CM

## 2019-10-04 DIAGNOSIS — I361 Nonrheumatic tricuspid (valve) insufficiency: Secondary | ICD-10-CM

## 2019-10-04 DIAGNOSIS — K7460 Unspecified cirrhosis of liver: Secondary | ICD-10-CM

## 2019-10-04 DIAGNOSIS — B182 Chronic viral hepatitis C: Secondary | ICD-10-CM

## 2019-10-04 DIAGNOSIS — K7031 Alcoholic cirrhosis of liver with ascites: Secondary | ICD-10-CM

## 2019-10-04 DIAGNOSIS — K222 Esophageal obstruction: Secondary | ICD-10-CM

## 2019-10-04 DIAGNOSIS — G9340 Encephalopathy, unspecified: Secondary | ICD-10-CM

## 2019-10-04 DIAGNOSIS — D61818 Other pancytopenia: Secondary | ICD-10-CM

## 2019-10-04 DIAGNOSIS — D649 Anemia, unspecified: Secondary | ICD-10-CM

## 2019-10-04 DIAGNOSIS — K7030 Alcoholic cirrhosis of liver without ascites: Principal | ICD-10-CM

## 2019-10-04 DIAGNOSIS — K7290 Hepatic failure, unspecified without coma: Secondary | ICD-10-CM

## 2019-10-04 DIAGNOSIS — D696 Thrombocytopenia, unspecified: Secondary | ICD-10-CM

## 2019-10-04 DIAGNOSIS — Z8521 Personal history of malignant neoplasm of larynx: Secondary | ICD-10-CM

## 2019-10-04 DIAGNOSIS — J449 Chronic obstructive pulmonary disease, unspecified: Secondary | ICD-10-CM

## 2019-10-04 DIAGNOSIS — K31819 Angiodysplasia of stomach and duodenum without bleeding: Secondary | ICD-10-CM

## 2019-10-04 DIAGNOSIS — D631 Anemia in chronic kidney disease: Secondary | ICD-10-CM

## 2019-10-04 HISTORY — PX: HOT HEMOSTASIS: SHX5433

## 2019-10-04 HISTORY — PX: ESOPHAGOGASTRODUODENOSCOPY (EGD) WITH PROPOFOL: SHX5813

## 2019-10-04 HISTORY — PX: ESOPHAGEAL BANDING: SHX5518

## 2019-10-04 LAB — CBC
HCT: 31 % — ABNORMAL LOW (ref 36.0–46.0)
HCT: 29.6 % — ABNORMAL LOW (ref 36.0–46.0)
HCT: 30.1 % — ABNORMAL LOW (ref 36.0–46.0)
Hemoglobin: 9.9 g/dL — ABNORMAL LOW (ref 12.0–15.0)
Hemoglobin: 9.1 g/dL — ABNORMAL LOW (ref 12.0–15.0)
Hemoglobin: 9.8 g/dL — ABNORMAL LOW (ref 12.0–15.0)
MCH: 29.8 pg (ref 26.0–34.0)
MCH: 29.3 pg (ref 26.0–34.0)
MCH: 30.2 pg (ref 26.0–34.0)
MCHC: 31.9 g/dL (ref 30.0–36.0)
MCHC: 30.7 g/dL (ref 30.0–36.0)
MCHC: 32.6 g/dL (ref 30.0–36.0)
MCV: 93.4 fL (ref 80.0–100.0)
MCV: 95.2 fL (ref 80.0–100.0)
MCV: 92.6 fL (ref 80.0–100.0)
Platelets: 71 10*3/uL — ABNORMAL LOW (ref 150–400)
Platelets: 56 10*3/uL — ABNORMAL LOW (ref 150–400)
Platelets: 73 10*3/uL — ABNORMAL LOW (ref 150–400)
RBC: 3.32 MIL/uL — ABNORMAL LOW (ref 3.87–5.11)
RBC: 3.11 MIL/uL — ABNORMAL LOW (ref 3.87–5.11)
RBC: 3.25 MIL/uL — ABNORMAL LOW (ref 3.87–5.11)
RDW: 16.7 % — ABNORMAL HIGH (ref 11.5–15.5)
RDW: 17.1 % — ABNORMAL HIGH (ref 11.5–15.5)
RDW: 16.7 % — ABNORMAL HIGH (ref 11.5–15.5)
WBC: 3.9 10*3/uL — ABNORMAL LOW (ref 4.0–10.5)
WBC: 3.3 10*3/uL — ABNORMAL LOW (ref 4.0–10.5)
WBC: 3.2 10*3/uL — ABNORMAL LOW (ref 4.0–10.5)
nRBC: 0 % (ref 0.0–0.2)
nRBC: 0 % (ref 0.0–0.2)
nRBC: 0 % (ref 0.0–0.2)

## 2019-10-04 LAB — COMPREHENSIVE METABOLIC PANEL
ALT: 23 U/L (ref 0–44)
AST: 47 U/L — ABNORMAL HIGH (ref 15–41)
Albumin: 2.6 g/dL — ABNORMAL LOW (ref 3.5–5.0)
Alkaline Phosphatase: 65 U/L (ref 38–126)
Anion gap: 11 (ref 5–15)
BUN: 17 mg/dL (ref 6–20)
CO2: 23 mmol/L (ref 22–32)
Calcium: 9.2 mg/dL (ref 8.9–10.3)
Chloride: 100 mmol/L (ref 98–111)
Creatinine, Ser: 1.64 mg/dL — ABNORMAL HIGH (ref 0.44–1.00)
GFR calc Af Amer: 40 mL/min — ABNORMAL LOW (ref >60)
GFR calc non Af Amer: 34 mL/min — ABNORMAL LOW (ref >60)
Glucose, Bld: 104 mg/dL — ABNORMAL HIGH (ref 70–99)
Potassium: 4.1 mmol/L (ref 3.5–5.1)
Sodium: 134 mmol/L — ABNORMAL LOW (ref 135–145)
Total Bilirubin: 2.4 mg/dL — ABNORMAL HIGH (ref 0.3–1.2)
Total Protein: 6.8 g/dL (ref 6.5–8.1)

## 2019-10-04 LAB — PROTIME-INR
INR: 1.4 — ABNORMAL HIGH (ref 0.8–1.2)
Prothrombin Time: 16.3 seconds — ABNORMAL HIGH (ref 11.4–15.2)

## 2019-10-04 LAB — ECHOCARDIOGRAM COMPLETE
Area-P 1/2: 7.16 cm2
Height: 62 in
S' Lateral: 2.95 cm
Weight: 2268.8 oz

## 2019-10-04 LAB — TYPE AND SCREEN
ABO/RH(D): O POS
Antibody Screen: NEGATIVE
Unit division: 0

## 2019-10-04 LAB — BPAM RBC
Blood Product Expiration Date: 202110242359
ISSUE DATE / TIME: 202109211933
Unit Type and Rh: 5100

## 2019-10-04 SURGERY — ESOPHAGOGASTRODUODENOSCOPY (EGD) WITH PROPOFOL
Anesthesia: Monitor Anesthesia Care

## 2019-10-04 MED ORDER — HYDROXYZINE HCL 25 MG PO TABS
25.0000 mg | ORAL_TABLET | Freq: Four times a day (QID) | ORAL | Status: DC | PRN
  Administered 2019-10-04 – 2019-10-08 (×12): 25 mg via ORAL
  Filled 2019-10-04 (×13): qty 1

## 2019-10-04 MED ORDER — PROPOFOL 500 MG/50ML IV EMUL
INTRAVENOUS | Status: DC | PRN
  Administered 2019-10-04: 100 ug/kg/min via INTRAVENOUS

## 2019-10-04 MED ORDER — LACTATED RINGERS IV SOLN: INTRAVENOUS | Status: DC

## 2019-10-04 MED ORDER — ACETAMINOPHEN 650 MG RE SUPP: 325.0000 mg | Freq: Four times a day (QID) | RECTAL | Status: DC | PRN

## 2019-10-04 MED ORDER — PROPOFOL 10 MG/ML IV BOLUS
INTRAVENOUS | Status: DC | PRN
  Administered 2019-10-04: 20 mg via INTRAVENOUS
  Administered 2019-10-04 (×2): 25 mg via INTRAVENOUS

## 2019-10-04 MED ORDER — ACETAMINOPHEN 500 MG PO TABS: 500.0000 mg | ORAL_TABLET | Freq: Four times a day (QID) | ORAL | Status: DC | PRN

## 2019-10-04 MED ORDER — NYSTATIN 100000 UNIT/ML MT SUSP: 5.0000 mL | Freq: Four times a day (QID) | OROMUCOSAL | Status: DC

## 2019-10-04 MED ORDER — NYSTATIN 100000 UNIT/ML MT SUSP
5.0000 mL | Freq: Four times a day (QID) | OROMUCOSAL | Status: DC
  Administered 2019-10-05 – 2019-10-08 (×14): 500000 [IU] via ORAL
  Filled 2019-10-04 (×14): qty 5

## 2019-10-04 MED ORDER — FENTANYL CITRATE (PF) 100 MCG/2ML IJ SOLN
INTRAMUSCULAR | Status: AC
  Filled 2019-10-04: qty 2

## 2019-10-04 SURGICAL SUPPLY — 14 items
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
## 2019-10-04 NOTE — Anesthesia Procedure Notes (Signed)
Procedure Name: MAC Date/Time: 10/04/2019 3:20 PM Performed by: Imagene Riches, CRNA Pre-anesthesia Checklist: Patient identified, Emergency Drugs available, Suction available, Patient being monitored and Timeout performed Patient Re-evaluated:Patient Re-evaluated prior to induction Oxygen Delivery Method: Nasal cannula

## 2019-10-04 NOTE — Interval H&P Note (Signed)
History and Physical Interval Note:  10/04/2019 2:57 PM  Windi Toro  has presented today for surgery, with the diagnosis of Cirrhosis, history of esophageal varices. FOBT positive. Transfusion requiring anemia..  The various methods of treatment have been discussed with the patient and family. After consideration of risks, benefits and other options for treatment, the patient has consented to  Procedure(s): ESOPHAGOGASTRODUODENOSCOPY (EGD) WITH PROPOFOL (N/A) as a surgical intervention.  The patient's history has been reviewed, patient examined, no change in status, stable for surgery.  I have reviewed the patient's chart and labs.  Questions were answered to the patient's satisfaction.     Dominic Pea Lanora Reveron

## 2019-10-04 NOTE — Anesthesia Postprocedure Evaluation (Signed)
Anesthesia Post Note  Patient: Judy Meza  Procedure(s) Performed: ESOPHAGOGASTRODUODENOSCOPY (EGD) WITH PROPOFOL (N/A ) ESOPHAGEAL BANDING HOT HEMOSTASIS (ARGON PLASMA COAGULATION/BICAP) (N/A )     Patient location during evaluation: PACU Anesthesia Type: MAC Level of consciousness: awake and alert Pain management: pain level controlled Vital Signs Assessment: post-procedure vital signs reviewed and stable Respiratory status: spontaneous breathing, nonlabored ventilation and respiratory function stable Cardiovascular status: blood pressure returned to baseline and stable Postop Assessment: no apparent nausea or vomiting Anesthetic complications: no   No complications documented.  Last Vitals:  Vitals:   10/04/19 1334 10/04/19 1530  BP: 111/74 116/79  Pulse: 76 86  Resp: 14 19  Temp: 36.7 C 36.6 C  SpO2: 100% 100%    Last Pain:  Vitals:   10/04/19 1530  TempSrc: Temporal  PainSc: 0-No pain                 Audry Pili
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
## 2019-10-04 NOTE — Transfer of Care (Signed)
Immediate Anesthesia Transfer of Care Note  Patient: Judy Meza  Procedure(s) Performed: ESOPHAGOGASTRODUODENOSCOPY (EGD) WITH PROPOFOL (N/A ) ESOPHAGEAL BANDING HOT HEMOSTASIS (ARGON PLASMA COAGULATION/BICAP) (N/A )  Patient Location: Endoscopy Unit  Anesthesia Type:MAC  Level of Consciousness: drowsy  Airway & Oxygen Therapy: Patient Spontanous Breathing and Patient connected to nasal cannula oxygen  Post-op Assessment: Report given to RN and Post -op Vital signs reviewed and stable  Post vital signs: Reviewed and stable  Last Vitals:  Vitals Value Taken Time  BP    Temp    Pulse    Resp    SpO2      Last Pain:  Vitals:   10/04/19 1334  TempSrc: Oral  PainSc: 0-No pain         Complications: No complications documented.

## 2019-10-04 NOTE — Progress Notes (Signed)
Subjective:  Judy Meza is a 59 year old female [place] of Hep C cirrhosis, COPD, laryngeal cancer, CKD, dementia, anemia, and previous substance abusepresenting who presented to the ED with increased confusion after an unwitnessed fall, and was found on the floor by one of her sisters. Patient has difficulty speaking due to her trach but is able to answer yes/no questions and follow directions. Patient has mild dementia at baseline. She is having about 3 bowel movements a day while on the lactulose and had 1.5 L of fluid drained from her belly in March 2021 in [place]. Patient initially complained of pain and swelling in RLE, DVT was ruled out in BLE with doppler US on 10/03/19.   Patient was evaluated at bedside this morning, overnight there were no acute events. She reports that she is feeling well overall. She reports that on 10/03/19 she sat up on the side of her bed, and that is the last thing she remembers until she was woken up by her sister. She denies having any prodromal symptoms prior to her fall.  She felt her usual self when she woke up. She says she has lost consciousness in the past "a while ago" that she states was attributed to her dementia. When asked for details, she states she takes memory pills for her dementia. She knows she is bleeding and agrees she thinks it is from her liver disease, but does not know where her bleeding has been coming from. She denies any dark stools, blood in her stools, vomiting any blood. She says her stools have been normal on lactulose. She says that she is hungry and wondering when she can eat food. Endorses dry mouth. She says she has felt cold. She says that the scars on her legs are from a history of drug use but she states she has been clean for 7 years. She says her legs have been swollen for 1 week so bad they hurt her. She says her right foot has hurt her more than the left and hurt when anything would touch it. She says her legs look shiny and the  same size to her. She attributes her swelling to salt, saying she has had more salt than usual recently. She says she "lives to eat". She denies headaches, dizziness, chest pain, shortness of breath, abdominal pain, nausea, vomiting, dysuria, hematuria, hematochezia.   Objective:  Vital signs in last 24 hours: Vitals:   10/04/19 1530 10/04/19 1540 10/04/19 1550 10/04/19 1600  BP: 116/79 125/76 105/76 120/69  Pulse: 86 80 79 76  Resp: 19 14 17 18   Temp: 97.8 F (36.6 C)     TempSrc: Temporal     SpO2: 100% 100% 100% 100%  Weight:      Height:       Weight change:   Intake/Output Summary (Last 24 hours) at 10/04/2019 1746 Last data filed at 10/04/2019 1112 Gross per 24 hour  Intake 50 ml  Output 2200 ml  Net -2150 ml   PHYSICAL EXAMINATION:  General Appearance: The patient is a cachetic appearing female, in no acute distress.   HEENT: Extraocular muscles are intact. Pupils are round and reactive to light. Icteric sclera.  Mouth: Oral mucosa is pink and moist.  Neck: Supple. Trachea is midline, tracheostomy tube in place. There is no jugular venous distention noted. There are no carotid bruits noted. There are no palpable masses.  Lungs: Clear to auscultation bilaterally. There are no crackles, wheezes or rhonchi noted. There is no crepitus on  palpation.  Heart: Regular rate and rhythm, S1/S2. No murmurs are noted. There are no lifts, heaves or thrills noted on palpation.  Abdomen: Soft and nontender. There are good bowel sounds. There is no rebound or guarding. There is no evidence of hernia.  Skin: Scars on LLE reportedly from IVDU. Warm and dry with good turgor.  MSK: There is no clubbing, cyanosis. 1-2+ pitting edema on RLE, 0-1+ pitting edema in LLE Neuro: Cranial nerves II through XII are grossly intact. Sensation to light touch and pain is intact bilaterally.  Psych: The patient is alert and oriented to person, place and time. There is no apparent mood  disorder.    Assessment/Plan:  Principal Problem:   Secondary esophageal varices with bleeding (HCC) Active Problems:   COPD with chronic bronchitis (HCC)   Cirrhosis of liver (HCC)   Pancytopenia, acquired ([place])   Dementia without behavioral disturbance (HCC)   Chronic hepatitis C without hepatic coma (HCC)   History of laryngeal cancer   Decompensated hepatic cirrhosis (HCC)   Acute on chronic anemia   Esophageal stricture   Cirrhosis ([place])   [name] is a 59 year old female [place] of Hep C cirrhosis, COPD, laryngeal cancer, CKD, dementia, anemia, and previous substance abusepresenting who presented to the ED with encephalopathy after an unwitnessed fall and found to have acute on chronic anemia.   #Symptomatic Acute on chronic anemia, possibly 2/2 variceal bleed Hgb on admission was 6.6 with baseline hgb around 9. Per sister, pt was transfused twice in [place] last year. Positive FOBT. Platelet count low but stable at 70. No known history of GI bleed, no hematochezia or melena. -- She was given 1 unit of pRBC in the ED. -- Hbg 9.1 from 10/04/19 at 0934.  --GI consulted, appreciate recs --IV Octreotide 50 mcg/hr --IV Ceftriaxone 2 g 200 mL/hr --IV protonix 40 daily --F/u post transfusion H & H --Type and screen  #Decompensated cirrhosis  Cirrhosis 2/2 to hep C infection and chronic alcohol abuse. On lactulose and rifaximin at home. Mental status has improved since admission. Normal white count. No ascites seen on U/S.  --GI consulted, appreciate recs --Sofosbuvir-Velpatasvir 400-100 mg daily --Ritaximin 550 mg po daily --Lactulose 20 g po TID --Spinolactone 50 mg po daily --Thiamine 259 mg daily --Folic acid 1 mg daily --IV Lasix 40 mg BID --F/u repeat CK, pro-INR --F/u AM CMP  #Chronic Thrombocytopenia --Platelets on admission 70, currently 56 --Averages between 55 - 84 since 04/29/19 --No petechiae noted on exam --Monitor with CBC   #CKD III Patient  follows up with [facility]. BUN/sCr of 21/1.72 on admission up from 15/1.40 one month ago. Patient is on lasix 40 mg at home, but unsure if she has been taking it consistently.  --Continue IV lasix 40 mg BID --Strict I & Os --F/u AM BMP  #Fall Patient sustained an unwitnessed fall at home. Patient had loss of consciousness but denied hitting head. CT head neg for acute intracranial pathology. CT cervical spine and Xray R foot neg for fractures.  --Tylenol 650 mg q6h prn for pain --Fall precautions --PT/OT eval and treat  #Dementia Patient found to have increased confusion on admission. Patient A&Ox4 on exam. Patient at high risk for delirium. Reorient as needed.  --Delirium precautions  #Laryngeal cancer s/p tracheostomy Currently in remission. Mild congestion on auscultation of anterior chest. Will suction as needed.  --Trach care --Pulse Ox  #Hx of alcohol use Ethanol level was normal on admission.  --CIWA w/o ativan  LOS: 1 day   [name], Medical Student 10/04/2019, 5:46 PM

## 2019-10-04 NOTE — Op Note (Signed)
[facility] Patient Name: Judy Meza Procedure Date : 10/04/2019 MRN: [id number] Attending MD: Gerrit Heck , MD Date of Birth: 1960/06/14 CSN: [id number] Age: 59 Admit Type: Outpatient Procedure:                Upper GI endoscopy Indications:              Acute post hemorrhagic anemia, Heme positive stool,                            Cirrhosis with suspected esophageal varices                           MELD: 20                           Child Pugh: C (10 points) Providers:                Gerrit Heck, MD, Benay Pillow, RN, Elspeth Cho Tech., Technician Referring MD:              Medicines:                Monitored Anesthesia Care Complications:            No immediate complications. Estimated Blood Loss:     Estimated blood loss was minimal. Procedure:                Pre-Anesthesia Assessment:                           - Prior to the procedure, a History and Physical                            was performed, and patient medications and                            allergies were reviewed. The patient's tolerance of                            previous anesthesia was also reviewed. The risks                            and benefits of the procedure and the sedation                            options and risks were discussed with the patient.                            All questions were answered, and informed consent                            was obtained. Prior Anticoagulants: The patient has                            taken no previous anticoagulant or  antiplatelet                            agents. ASA Grade Assessment: III - A patient with                            severe systemic disease. After reviewing the risks                            and benefits, the patient was deemed in                            satisfactory condition to undergo the procedure.                           After obtaining informed consent, the endoscope  was                            passed under direct vision. Throughout the                            procedure, the patient's blood pressure, pulse, and                            oxygen saturations were monitored continuously. The                            GIF-H190 ([id number] Olympus gastroscope was                            introduced through the mouth, and advanced to the                            second part of duodenum. The upper GI endoscopy was                            accomplished without difficulty. The patient                            tolerated the procedure well. Scope In: Scope Out: Findings:      Grade II varices were found in the lower third of the esophagus. They       were 5 mm in largest diameter and 2 columns. Based on clinical       presentation and the advanced Child Pugh classification, the decision       was made for endoscopic variceal ligation. Three bands were successfully       placed with complete eradication, resulting in deflation of varices.       There was no bleeding at the end of the procedure.      One benign-appearing, intrinsic mild stenosis was found in the upper       third of the esophagus. The stenosis was traversed easily with the       endoscope, but with small mucosal rent with passage of the endoscope       with banding cap in  place.      Mild inflammation characterized by erythema was found in the gastric       body and in the gastric antrum.      A single small angioectasia without bleeding was found in the second       portion of the duodenum. Coagulation for hemostasis using argon plasma       at 2 liters/minute and 20 watts was successful. Estimated blood loss:       none.      The duodenal bulb was normal. Impression:               - Grade II esophageal varices. Completely                            eradicated. Banded.                           - Benign-appearing esophageal stenosis in the                            proximal  esophagus/UES. Location most likely                            secondary to post-operative and post radiation                            scarring. This was dilated with the passage of the                            endoscope with band cap in place, with appropriate                            small mucosal rent. No bleeding at the conclusion                            of the study.                           - Portal hypertensive gastropathy.                           - A single non-bleeding angioectasia in the                            duodenum. Treated with argon plasma coagulation                            (APC).                           - Normal duodenal bulb.                           - No specimens collected. Recommendation:           - Return patient to hospital ward for ongoing care.                           -  Clear liquid diet.                           - Continue present medications.                           - Repeat upper endoscopy in 6 weeks for retreatment.                           - Continue serial Hgb/Hct checks with additional                            blood prodcuts as needed. Would not transfuse above                            Hgb 8.                           - Resume octreotide, Rocephin, and PPI. Procedure Code(s):        --- Professional ---                           [phone], Esophagogastroduodenoscopy, flexible,                            transoral; with band ligation of esophageal/gastric                            varices                           [id number], 59, Esophagogastroduodenoscopy, flexible,                            transoral; with control of bleeding, any method Diagnosis Code(s):        --- Professional ---                           K74.60, Unspecified cirrhosis of liver                           I85.10, Secondary esophageal varices without                            bleeding                           K22.2, Esophageal obstruction                            K29.70, Gastritis, unspecified, without bleeding                           K31.819, Angiodysplasia of stomach and duodenum                            without bleeding  D62, Acute posthemorrhagic anemia                           R19.5, Other fecal abnormalities CPT copyright 2019 [employer]. All rights reserved. The codes documented in this report are preliminary and upon coder review may  be revised to meet current compliance requirements. Gerrit Heck, MD 10/04/2019 3:38:33 PM Number of Addenda: 0

## 2019-10-04 NOTE — Anesthesia Preprocedure Evaluation (Addendum)
Anesthesia Evaluation  Patient identified by MRN, date of birth, ID band Patient awake    Reviewed: Allergy & Precautions, NPO status , Patient's Chart, lab work & pertinent test results  Airway Mallampati: II   Neck ROM: Full   Comment:  Hx of Laryngeal cancer, S/P tracheostomy now s/p decannulation  Dental  (+) Edentulous Upper, Edentulous Lower   Pulmonary COPD, Current Smoker and Patient abstained from smoking., former smoker,    Pulmonary exam normal        Cardiovascular hypertension, Normal cardiovascular exam  IMPRESSIONS    1. Left ventricular ejection fraction, by estimation, is 55 to 60%. The  left ventricle has normal function. The left ventricle has no regional  wall motion abnormalities. Left ventricular diastolic parameters are  consistent with Grade I diastolic  dysfunction (impaired relaxation).  2. Right ventricular systolic function is normal. The right ventricular  size is normal. There is mildly elevated pulmonary artery systolic  pressure.  3. Left atrial size was moderately dilated.  4. The mitral valve is normal in structure. Trivial mitral valve  regurgitation.  5. The aortic valve is normal in structure. Aortic valve regurgitation is  not visualized.  6. The inferior vena cava is normal in size with greater than 50%  respiratory variability, suggesting right atrial pressure of 3 mmHg.    Neuro/Psych PSYCHIATRIC DISORDERS Dementia negative neurological ROS     GI/Hepatic negative GI ROS, (+) Cirrhosis     substance abuse  alcohol use, Hepatitis -, C  Endo/Other  negative endocrine ROS  Renal/GU Renal InsufficiencyRenal disease     Musculoskeletal negative musculoskeletal ROS (+)   Abdominal   Peds  Hematology  (+) anemia ,  Thrombocytopenia, Plt 56k INR 1.4    Anesthesia Other Findings Hx laryngeal cancer s/p radiation, tracheostomy (since decannulated) Covid test  negative   Reproductive/Obstetrics                         Anesthesia Physical Anesthesia Plan  ASA: III  Anesthesia Plan: MAC   Post-op Pain Management:    Induction: Intravenous  PONV Risk Score and Plan: 2 and Treatment may vary due to age or medical condition and Propofol infusion  Airway Management Planned: Simple Face Mask and Natural Airway  Additional Equipment: None  Intra-op Plan:   Post-operative Plan:   Informed Consent: I have reviewed the patients History and Physical, chart, labs and discussed the procedure including the risks, benefits and alternatives for the proposed anesthesia with the patient or authorized representative who has indicated his/her understanding and acceptance.     Dental advisory given  Plan Discussed with: CRNA and Anesthesiologist  Anesthesia Plan Comments:       Anesthesia Quick Evaluation

## 2019-10-04 NOTE — ED Notes (Signed)
Pt asking for medicine for itching. Informed [place] - Therapist, sports. Pt given ice chips, per [place] - RN.

## 2019-10-05 ENCOUNTER — Encounter (HOSPITAL_COMMUNITY): Payer: Self-pay | Admitting: Gastroenterology

## 2019-10-05 ENCOUNTER — Encounter (HOSPITAL_COMMUNITY): Payer: Medicaid Other

## 2019-10-05 LAB — COMPREHENSIVE METABOLIC PANEL
ALT: 24 U/L (ref 0–44)
AST: 47 U/L — ABNORMAL HIGH (ref 15–41)
Albumin: 2.6 g/dL — ABNORMAL LOW (ref 3.5–5.0)
Alkaline Phosphatase: 65 U/L (ref 38–126)
Anion gap: 13 (ref 5–15)
BUN: 16 mg/dL (ref 6–20)
CO2: 21 mmol/L — ABNORMAL LOW (ref 22–32)
Calcium: 9 mg/dL (ref 8.9–10.3)
Chloride: 98 mmol/L (ref 98–111)
Creatinine, Ser: 1.7 mg/dL — ABNORMAL HIGH (ref 0.44–1.00)
GFR calc Af Amer: 38 mL/min — ABNORMAL LOW (ref >60)
GFR calc non Af Amer: 33 mL/min — ABNORMAL LOW (ref >60)
Glucose, Bld: 117 mg/dL — ABNORMAL HIGH (ref 70–99)
Potassium: 3.8 mmol/L (ref 3.5–5.1)
Sodium: 132 mmol/L — ABNORMAL LOW (ref 135–145)
Total Bilirubin: 1.6 mg/dL — ABNORMAL HIGH (ref 0.3–1.2)
Total Protein: 6.7 g/dL (ref 6.5–8.1)

## 2019-10-05 LAB — CBC
HCT: 30.4 % — ABNORMAL LOW (ref 36.0–46.0)
Hemoglobin: 9.7 g/dL — ABNORMAL LOW (ref 12.0–15.0)
MCH: 29.3 pg (ref 26.0–34.0)
MCHC: 31.9 g/dL (ref 30.0–36.0)
MCV: 91.8 fL (ref 80.0–100.0)
Platelets: 70 10*3/uL — ABNORMAL LOW (ref 150–400)
RBC: 3.31 MIL/uL — ABNORMAL LOW (ref 3.87–5.11)
RDW: 16.6 % — ABNORMAL HIGH (ref 11.5–15.5)
WBC: 3.2 10*3/uL — ABNORMAL LOW (ref 4.0–10.5)
nRBC: 0 % (ref 0.0–0.2)

## 2019-10-05 MED ORDER — FUROSEMIDE 40 MG PO TABS
40.0000 mg | ORAL_TABLET | Freq: Every day | ORAL | Status: DC
  Administered 2019-10-05 – 2019-10-08 (×4): 40 mg via ORAL
  Filled 2019-10-05 (×4): qty 1

## 2019-10-05 MED ORDER — SODIUM CHLORIDE 0.9 % IV SOLN
2.0000 g | INTRAVENOUS | Status: DC
  Administered 2019-10-05 – 2019-10-07 (×3): 2 g via INTRAVENOUS
  Filled 2019-10-05 (×2): qty 20
  Filled 2019-10-05 (×2): qty 2

## 2019-10-05 MED ORDER — OLOPATADINE HCL 0.1 % OP SOLN
1.0000 [drp] | Freq: Two times a day (BID) | OPHTHALMIC | Status: DC | PRN
  Administered 2019-10-05 – 2019-10-07 (×3): 1 [drp] via OPHTHALMIC
  Filled 2019-10-05: qty 5

## 2019-10-05 MED ORDER — PANTOPRAZOLE SODIUM 40 MG PO TBEC
40.0000 mg | DELAYED_RELEASE_TABLET | Freq: Every day | ORAL | Status: DC
  Administered 2019-10-06 – 2019-10-08 (×3): 40 mg via ORAL
  Filled 2019-10-05 (×3): qty 1

## 2019-10-05 MED ORDER — HYDROCERIN EX CREA
TOPICAL_CREAM | Freq: Two times a day (BID) | CUTANEOUS | Status: DC
  Filled 2019-10-05: qty 113

## 2019-10-05 MED ORDER — LIDOCAINE VISCOUS HCL 2 % MT SOLN: 15.0000 mL | OROMUCOSAL | Status: AC | PRN

## 2019-10-05 NOTE — Progress Notes (Addendum)
Subjective:  Judy Meza was evaluated at bedside this morning, overnight there were no acute events. She reports she is doing well overall. She reports the swelling and pain in her legs has decreased significantly. She is hungry and wishes to eat. Patient states she didn't sleep well last night because she was having severe throat pain. She states she did not receive any pain medications. She endorses frequent bowel movements overnight and states she continues to urinate well. Her sister is in the room and states she found her on her knees and she was unable to get up on her own. Her sister was not there when she initially was down and is unable to say whether she lost consciousness before she arrived. Sister states she received a single RBC transfusion last night. She denies headaches, dizziness, chest pain, shortness of breath, abdominal pain, nausea, vomiting, dysuria, hematuria, hematochezia.   Objective:  Vital signs in last 24 hours: Vitals:   10/04/19 1715 10/04/19 1859 10/05/19 0038 10/05/19 0524  BP: 123/72 (!) 143/95 119/75 130/87  Pulse: 72 73 77 78  Resp: 16 18 18 18   Temp: 98.2 F (36.8 C) 98.7 F (37.1 C) 97.9 F (36.6 C) 98.7 F (37.1 C)  TempSrc: Oral Oral Oral Oral  SpO2: 100% 100% 98% 96%  Weight:      Height:       Weight change: -0.02 kg  Intake/Output Summary (Last 24 hours) at 10/05/2019 0730 Last data filed at 10/05/2019 0526 Gross per 24 hour  Intake 1085.24 ml  Output 2900 ml  Net -1814.76 ml   PHYSICAL EXAMINATION:  General Appearance: The patient is a cachetic appearing female, in no acute distress.   HEENT: Extraocular muscles are intact. Pupils are round and reactive to light. Icteric sclera.  Mouth: Oral mucosa is pink and moist.  Neck: Supple. Trachea is midline, tracheostomy tube in place. There is no jugular venous distention noted. There are no carotid bruits noted. There are no palpable masses.  Lungs: Clear to auscultation bilaterally. There  are no crackles, wheezes or rhonchi noted. There is no crepitus on palpation.  Heart: Regular rate and rhythm, S1/S2. No murmurs are noted. There are no lifts, heaves or thrills noted on palpation.  Abdomen: Soft and nontender. There are good bowel sounds. There is no rebound or guarding. There is no evidence of hernia.  Skin: Scars on RUE and LLE reportedly from IVDU. Warm and dry with good turgor.  MSK: There is no clubbing, cyanosis. 0-1+ pitting edema BLE Neuro: Cranial nerves II through XII are grossly intact. Sensation to light touch and pain is intact bilaterally.  Psych: The patient is alert and oriented to person, place and time. There is no apparent mood disorder.    Assessment/Plan:  Principal Problem:   Secondary esophageal varices with bleeding (HCC) Active Problems:   COPD with chronic bronchitis (HCC)   Cirrhosis of liver (HCC)   Pancytopenia, acquired ([place])   Dementia without behavioral disturbance (HCC)   Chronic hepatitis C without hepatic coma (HCC)   History of laryngeal cancer   Decompensated hepatic cirrhosis (HCC)   Acute on chronic anemia   Esophageal stricture   Cirrhosis ([place])   [name] is a 59 year old female with PMH of Hep C cirrhosis, COPD, laryngeal cancer, CKD, dementia, anemia, and previous substance abuse presenting who presented to the ED with encephalopathy after an unwitnessed fall and found to have acute on chronic anemia 2/2 grade II esophageal varices which have been banded.    #  Symptomatic Acute on chronic anemia 2/2 variceal bleed Hgb on admission was 6.6 with baseline hgb around 9. Per sister, pt was transfused twice in [place] last year. Positive FOBT. Platelet count low but stable at 70. No known history of GI bleed, no hematochezia or melena. -- She was given 1 unit of pRBC in the ED. -- Hbg 9.8 from 10/04/19 at 1755.  --EGD from 10/04/19 showed grade II varices in lower 1/3 of esophagus, 3 bands placed by GI --IV Octreotide 50  mcg/hr --Consider adding nonselective BB such as propanolol or nadolol after stopping octreotide --IV Ceftriaxone 2 g 200 mL/hr --IV protonix 40 daily --appreciate GI recs    #Decompensated cirrhosis  Cirrhosis 2/2 to hep C infection and chronic alcohol abuse. On lactulose and rifaximin at home. Mental status has improved since admission. Normal white count. No ascites seen on U/S.  --GI consulted, appreciate recs --Sofosbuvir-Velpatasvir 400-100 mg daily --Rifaximin 550 mg po daily --Lactulose 20 g po TID --Spinolactone 50 mg po daily --Thiamine 622 mg daily --Folic acid 1 mg daily --IV Lasix 40 mg BID decreased to 40 QD --F/u repeat CK, pro-INR --F/u AM CMP   #Chronic Thrombocytopenia --Platelets on admission 70, currently 73 --Averages between 55 - 84 since 04/29/19 --No petechiae noted on exam --Monitor with CBC   #CKD III Patient follows up with [facility]. BUN/sCr of 21/1.72 on admission up from 15/1.40 one month ago. Patient is on lasix 40 mg at home, but unsure if she has been taking it consistently.  --Resume home diuretics of furosemide and spironolactone as above --Strict I & Os --F/u AM BMP   #Fall Patient sustained an unwitnessed fall at home. Patient had loss of consciousness but denied hitting head. CT head neg for acute intracranial pathology. CT cervical spine and Xray R foot neg for fractures.  --Tylenol 650 mg q6h prn for pain --Fall precautions --PT/OT eval and treat --TTE not concerning for cardiac etiology of fall   #Dementia Patient found to have increased confusion on admission. Patient A&Ox4 on exam. Patient at high risk for delirium. Reorient as needed.  --Delirium precautions -Continue rifaximin and lactulose for hepatic encephalopathy prophylaxis   #Laryngeal cancer s/p tracheostomy Reportedly currently in remission.  Lurline Idol has been removed, tracheostomy site remains open, no trach care needed other than dressing over site   #Hx of  alcohol use Ethanol level was normal on admission.  --CIWA w/o ativan   LOS: 2 days   [name], Medical Student 10/05/2019, 7:30 AM

## 2019-10-05 NOTE — Consult Note (Signed)
Daily Rounding Note  10/05/2019, 12:35 PM  LOS: 2 days   SUBJECTIVE:   Chief complaint: cirrhosis.  Anemia.  fobt +  A slight sore throat, O/w feels well.  Asked for solid food.  Had BM this AM, color unknown  OBJECTIVE:         Vital signs in last 24 hours:    Temp:  [97.8 F (36.6 C)-98.7 F (37.1 C)] 98.7 F (37.1 C) (09/23 0823) Pulse Rate:  [25-86] 78 (09/23 0823) Resp:  [14-19] 18 (09/23 0823) BP: (105-143)/(69-95) 130/87 (09/23 0823) SpO2:  [96 %-100 %] 96 % (09/23 0524) Weight:  [64.3 kg] 64.3 kg (09/22 1334) Last BM Date: 10/04/19 Filed Weights   10/03/19 1322 10/04/19 1334  Weight: 64.3 kg 64.3 kg   General: looks chronically ill.  Laert, comfortable, cachectic.  Halitosis.    Heart: RRR Chest: clear bil.  Trach in place.   Abdomen: soft, slight distention.  NT.  Active BS Extremities: slight, non-pitting LE edema Neuro/Psych:  oreieted to place, self.  Appropriate.  No asterixis or tremor    Intake/Output from previous day: 09/22 0701 - 09/23 0700 In: 1085.2 [P.O.:200; I.V.:783.8; IV Piggyback:101.4] Out: 2900 [Urine:2900]  Intake/Output this shift: No intake/output data recorded.  Lab Results: Recent Labs    10/04/19 0934 10/04/19 1755 10/05/19 0648  WBC 3.3* 3.2* 3.2*  HGB 9.1* 9.8* 9.7*  HCT 29.6* 30.1* 30.4*  PLT 56* 73* 70*   BMET Recent Labs    10/03/19 1459 10/03/19 1459 10/03/19 1507 10/04/19 0300 10/05/19 0648  NA 137   < > 140 134* 132*  K 3.9   < > 3.8 4.1 3.8  CL 105   < > 105 100 98  CO2 21*  --   --  23 21*  GLUCOSE 80   < > 78 104* 117*  BUN 21*   < > 21* 17 16  CREATININE 1.72*   < > 1.80* 1.64* 1.70*  CALCIUM 9.4  --   --  9.2 9.0   < > = values in this interval not displayed.   LFT Recent Labs    10/03/19 1459 10/04/19 0300 10/05/19 0648  PROT 6.7 6.8 6.7  ALBUMIN 2.7* 2.6* 2.6*  AST 48* 47* 47*  ALT 25 23 24   ALKPHOS 66 65 65  BILITOT 1.8* 2.4*  1.6*   PT/INR Recent Labs    10/03/19 1459 10/04/19 0300  LABPROT 16.5* 16.3*  INR 1.4* 1.4*   Hepatitis Panel No results for input(s): HEPBSAG, HCVAB, HEPAIGM, HEPBIGM in the last 72 hours.  Studies/Results: DG Tibia/Fibula Right  Result Date: 10/03/2019 CLINICAL DATA:  Right lower leg pain and swelling after recent fall EXAM: RIGHT TIBIA AND FIBULA - 2 VIEW COMPARISON:  None. FINDINGS: There is no evidence of fracture. No malalignment. No suspicious bone lesion. Mild diffuse soft tissue edema. No focal soft tissue swelling. Scattered vascular calcifications. IMPRESSION: 1. No acute osseous abnormality of the right tibia or fibula. 2. Mild diffuse soft tissue edema, nonspecific. Electronically Signed   By: Davina Poke D.O.   On: 10/03/2019 14:16   CT Head Wo Contrast  Result Date: 10/03/2019 CLINICAL DATA:  Altered mental status, EXAM: CT HEAD WITHOUT CONTRAST CT CERVICAL SPINE WITHOUT CONTRAST TECHNIQUE: Multidetector CT imaging of the head and cervical spine was performed following the standard protocol without intravenous contrast. Multiplanar CT image reconstructions of the cervical spine were also generated. COMPARISON:  None. FINDINGS: CT  HEAD FINDINGS Brain: No evidence of acute infarction, hemorrhage, hydrocephalus, extra-axial collection or mass lesion/mass effect. Periventricular and deep white matter hypodensity. Vascular: No hyperdense vessel or unexpected calcification. Skull: Normal. Negative for fracture or focal lesion. Sinuses/Orbits: No acute finding. Other: None. CT CERVICAL SPINE FINDINGS Alignment: Normal. Skull base and vertebrae: No acute fracture. No primary bone lesion or focal pathologic process. Soft tissues and spinal canal: No prevertebral fluid or swelling. No visible canal hematoma. Disc levels:  Intact. Upper chest: Frothy debris in the lower trachea. Other: None. IMPRESSION: 1. No acute intracranial pathology. Small-vessel white matter disease. 2. No  fracture or static subluxation of the cervical spine. 3. Frothy debris in the lower trachea, concerning for aspiration. Electronically Signed   By: Eddie Candle M.D.   On: 10/03/2019 17:00   CT Cervical Spine Wo Contrast  Result Date: 10/03/2019 CLINICAL DATA:  Altered mental status, EXAM: CT HEAD WITHOUT CONTRAST CT CERVICAL SPINE WITHOUT CONTRAST TECHNIQUE: Multidetector CT imaging of the head and cervical spine was performed following the standard protocol without intravenous contrast. Multiplanar CT image reconstructions of the cervical spine were also generated. COMPARISON:  None. FINDINGS: CT HEAD FINDINGS Brain: No evidence of acute infarction, hemorrhage, hydrocephalus, extra-axial collection or mass lesion/mass effect. Periventricular and deep white matter hypodensity. Vascular: No hyperdense vessel or unexpected calcification. Skull: Normal. Negative for fracture or focal lesion. Sinuses/Orbits: No acute finding. Other: None. CT CERVICAL SPINE FINDINGS Alignment: Normal. Skull base and vertebrae: No acute fracture. No primary bone lesion or focal pathologic process. Soft tissues and spinal canal: No prevertebral fluid or swelling. No visible canal hematoma. Disc levels:  Intact. Upper chest: Frothy debris in the lower trachea. Other: None. IMPRESSION: 1. No acute intracranial pathology. Small-vessel white matter disease. 2. No fracture or static subluxation of the cervical spine. 3. Frothy debris in the lower trachea, concerning for aspiration. Electronically Signed   By: Eddie Candle M.D.   On: 10/03/2019 17:00   DG Foot Complete Right  Result Date: 10/03/2019 CLINICAL DATA:  Right foot pain and swelling after recent fall EXAM: RIGHT FOOT COMPLETE - 3+ VIEW COMPARISON:  None. FINDINGS: No acute fracture or dislocation within the right foot. Joint spaces are relatively preserved. No erosion. Scattered vascular calcifications. No focal soft tissue swelling. IMPRESSION: No acute fracture or  dislocation within the right foot. Electronically Signed   By: Davina Poke D.O.   On: 10/03/2019 14:14   ECHOCARDIOGRAM COMPLETE  Result Date: 10/04/2019 IMPRESSIONS  1. Left ventricular ejection fraction, by estimation, is 55 to 60%. The left ventricle has normal function. The left ventricle has no regional wall motion abnormalities. Left ventricular diastolic parameters are consistent with Grade II diastolic dysfunction (pseudonormalization).  2. Right ventricular systolic function is normal. The right ventricular size is normal. There is mildly elevated pulmonary artery systolic pressure.  3. Left atrial size was moderately dilated.  4. The mitral valve is normal in structure. Trivial mitral valve regurgitation.  5. The aortic valve is normal in structure. Aortic valve regurgitation is not visualized.  6. The inferior vena cava is normal in size with greater than 50% respiratory variability, suggesting right atrial pressure of 3 mmHg. Comparison(s): No significant change from prior study.  Gwyndolyn Kaufman MD Electronically signed by Gwyndolyn Kaufman MD Signature Date/Time: 10/04/2019/12:15:57 PM    Final (Updated)    [place] ASCITES (ABDOMEN LIMITED)  Result Date: 10/03/2019 CLINICAL DATA:  Evaluate for ascites. EXAM: LIMITED ABDOMEN ULTRASOUND FOR ASCITES TECHNIQUE: Limited ultrasound survey for ascites  was performed in all four abdominal quadrants. COMPARISON:  August 04, 2019 FINDINGS: No free fluid is seen within the 4 abdominal quadrants. IMPRESSION: No evidence of ascites. Electronically Signed   By: Virgina Norfolk M.D.   On: 10/03/2019 22:29   VAS [place] LOWER EXTREMITY VENOUS (DVT) (ONLY [facility] & [facility] 7a-7p)  Result Date: 10/03/2019 Summary: RIGHT: - There is no evidence of deep vein thrombosis in the lower extremity.  - No cystic structure found in the popliteal fossa.  LEFT: - There is no evidence of deep vein thrombosis in the lower extremity.  - No cystic structure found in the popliteal fossa.   *See table(s) above for measurements and observations. Electronically signed by Deitra Mayo MD on 10/03/2019 at 4:45:36 PM.    Final    Scheduled Meds: . folic acid  1 mg Oral Daily  . furosemide  40 mg Oral Daily  . hydrocerin   Topical BID  . lactulose  20 g Oral TID  . multivitamin with minerals  1 tablet Oral Daily  . nystatin  5 mL Oral QID  . pantoprazole (PROTONIX) IV  40 mg Intravenous Q24H  . rifaximin  550 mg Oral Daily  . Sofosbuvir-Velpatasvir  1 tablet Oral Daily  . spironolactone  50 mg Oral Daily  . thiamine  100 mg Oral Daily   Or  . thiamine  100 mg Intravenous Daily   Continuous Infusions: . cefTRIAXone (ROCEPHIN)  IV Stopped (10/04/19 2140)  . lactated ringers 10 mL/hr at 10/05/19 1052  . octreotide  (SANDOSTATIN)    IV infusion 50 mcg/hr (10/05/19 0858)   PRN Meds:.acetaminophen **OR** acetaminophen, albuterol, hydrOXYzine, olopatadine  ASSESMENT:   *   FOBT + acute on chronic anemia. Hx esoph varices 9/22 EGD:  - Grade II esophageal varices. Completely eradicated. Banded. - Benign-appearing esophageal stenosis in proximal esophagus/UES. Most likely secondary to post-operative and post radiation scarring. This was dilated with the passage of the endoscope with band cap in place, with appropriate small mucosal rent. No bleeding at conclusion of study. - Portal hypertensive gastropathy.  - Single non-bleeding angioectasia in duodenum. Treated with argon plasma coagulation (APC). - Normal duodenal bulb. D 2 Rocephin, octreotide, Protonix.   Hgb 6.6 >> 2 PRBC >> 9.7 >> 9.9.    *   Thrombocytopenia.  Chronic.  70 K.    *   Coagulopathy.  Chronic, stable.  INR 1.4.    *   Hep C.  Ongoing Epclusa per Dr Linus Salmons starting ~ 05/23/29.  Virus not detectable on labs 6/24  *   Hyponatremia.    *    Hx laryngeal cancer, s/p laryngectomy/radiation/tracheostomy.    *   CKD, AKI.  Stable.    *   Dementia.  HE.  Chronic Rifaximin, lactulose, Namenda.    *   No  ascites per ultrasound.     PLAN   *    EGD in ~ 6 weeks to re-eval/retreat varices.    *   Finish 5 d Rocephin or equivalent oral abx.   Finish octreotide after 72 hours.  Switch to po Protonix 40 mg/day tmrw.    *  Advanced to soft diet.     *   AFP tumor marking pndg.       Judy Meza  10/05/2019, 12:35 PM Phone [phone]

## 2019-10-05 NOTE — TOC Initial Note (Signed)
Transition of Care [facility]) - Initial/Assessment Note    Patient Details  Name: Judy Meza MRN: [id number] Date of Birth: [birth year]  [facility]) CM/SW Contact:    Hyman Hopes, RN Phone Number: 10/05/2019, 4:08 PM  Clinical Narrative: High risk readmission assessment complete.  Case manager noted high risk for readmission assessment needed.  Case manager spoke to patient about living alone but has 2 sisters that help her as needed, denies issues with obtaining, affording or taking medications, denies issues with transportation to providers says sisters take her.  Patient gets services from [facility].  Case manager called [facility] to speak with patient's social worker, not available, left message to call back.                  Expected Discharge Plan: [place] Barriers to Discharge: No Barriers Identified   Patient Goals and CMS Choice Patient states their goals for this hospitalization and ongoing recovery are:: to go home      Expected Discharge Plan and Services Expected Discharge Plan: [place]   Discharge Planning Services: CM Consult                                          Prior Living Arrangements/Services     Patient language and need for interpreter reviewed:: Yes Do you feel safe going back to the place where you live?: Yes      Need for Family Participation in Patient Care: Yes (Comment) Care giver support system in place?: Yes (comment)   Criminal Activity/Legal Involvement Pertinent to Current Situation/Hospitalization: No - Comment as needed  Activities of Daily Living Home Assistive Devices/Equipment: Walker (specify type), Eyeglasses ADL Screening (condition at time of admission) Patient's cognitive ability adequate to safely complete daily activities?: Yes Is the patient deaf or have difficulty hearing?: No Does the patient have difficulty seeing, even when wearing  glasses/contacts?: No Does the patient have difficulty concentrating, remembering, or making decisions?: No Patient able to express need for assistance with ADLs?: Yes Does the patient have difficulty dressing or bathing?: Yes Independently performs ADLs?: No Does the patient have difficulty walking or climbing stairs?: No Weakness of Legs: None Weakness of Arms/Hands: None  Permission Sought/Granted Permission sought to share information with : Case Manager Permission granted to share information with : Yes, Verbal Permission Granted              Emotional Assessment Appearance:: Appears stated age Attitude/Demeanor/Rapport: Engaged Affect (typically observed): Appropriate Orientation: : Oriented to Self, Oriented to Place, Oriented to  Time, Oriented to Situation Alcohol / Substance Use: Never Used Psych Involvement: No (comment)  Admission diagnosis:  Cirrhosis ([place]) [K74.60] Decompensated hepatic cirrhosis ([place]) [K72.90, K74.60] Abdominal ascites [R18.8] Patient Active Problem List   Diagnosis Date Noted  . Secondary esophageal varices with bleeding ([place])   . Esophageal stricture   . Cirrhosis ([place])   . Decompensated hepatic cirrhosis ([place]) 10/03/2019  . Acute on chronic anemia 10/03/2019  . Cancer screening 08/14/2019  . Back pain with radiculopathy 08/07/2019  . Recurrent falls 08/04/2019  . Multiple falls at home 08/03/2019  . 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 Goals of care, counseling/discussion 07/28/2019  . Acute encephalopathy 06/05/2019  . Chest pain 06/04/2019  . QT prolongation 06/04/2019  . Metabolic acidosis 86/57/8469  .  Dementia without behavioral disturbance ([place]) 05/10/2019  . Chronic hepatitis C without hepatic coma ([place]) 05/10/2019  . Hepatic encephalopathy ([place]) 05/09/2019  . Pancytopenia, acquired ([place])   . Cirrhosis of liver ([place])   . CKD (chronic kidney disease), stage III 04/29/2019  . COPD with chronic bronchitis ([address]) 04/29/2019   . Tobacco abuse 04/29/2019  . Alcoholism in remission ([place]) 04/29/2019  . Acute hepatic encephalopathy 04/29/2019  . AKI (acute kidney injury) ([place]) 04/29/2019  . Alcoholic cirrhosis of liver with ascites ([place]) 04/29/2019  . Thrombocytopenia ([place]) 04/29/2019  . Hyperkalemia 04/29/2019   PCP:  Waylan Rocher, MD Pharmacy:   [id number] - [place], [place] [address] Phone: [phone] Fax: [place], [place] - [place] [address] Phone: [phone] Fax: [phone]     Social Determinants of Health (SDOH) Interventions    Readmission Risk Interventions Readmission Risk Prevention Plan 10/05/2019 08/07/2019 06/06/2019  Transportation Screening Complete Complete Complete  HRI or Home Care Consult - Not Complete -  HRI or Home Care Consult comments - needs SNF -  Social Work Consult for [place] Planning/Counseling - Complete -  Palliative Care Screening - Not Applicable -  Medication Review (RN Care Manager) Complete Complete Complete  PCP or Specialist appointment within 3-5 days of discharge Complete - -  [place] or Home Care Consult Complete - Complete  SW Recovery Care/Counseling Consult Complete - Complete  Palliative Care Screening Not Applicable - Not [place] Not Applicable - Complete

## 2019-10-06 DIAGNOSIS — H1589 Other disorders of sclera: Secondary | ICD-10-CM

## 2019-10-06 LAB — COMPREHENSIVE METABOLIC PANEL
ALT: 22 U/L (ref 0–44)
AST: 48 U/L — ABNORMAL HIGH (ref 15–41)
Albumin: 2.7 g/dL — ABNORMAL LOW (ref 3.5–5.0)
Alkaline Phosphatase: 63 U/L (ref 38–126)
Anion gap: 14 (ref 5–15)
BUN: 16 mg/dL (ref 6–20)
CO2: 21 mmol/L — ABNORMAL LOW (ref 22–32)
Calcium: 8.9 mg/dL (ref 8.9–10.3)
Chloride: 100 mmol/L (ref 98–111)
Creatinine, Ser: 1.77 mg/dL — ABNORMAL HIGH (ref 0.44–1.00)
GFR calc Af Amer: 36 mL/min — ABNORMAL LOW (ref >60)
GFR calc non Af Amer: 31 mL/min — ABNORMAL LOW (ref >60)
Glucose, Bld: 89 mg/dL (ref 70–99)
Potassium: 4 mmol/L (ref 3.5–5.1)
Sodium: 135 mmol/L (ref 135–145)
Total Bilirubin: 1.3 mg/dL — ABNORMAL HIGH (ref 0.3–1.2)
Total Protein: 7.3 g/dL (ref 6.5–8.1)

## 2019-10-06 LAB — CBC
HCT: 33.8 % — ABNORMAL LOW (ref 36.0–46.0)
Hemoglobin: 10.8 g/dL — ABNORMAL LOW (ref 12.0–15.0)
MCH: 29.8 pg (ref 26.0–34.0)
MCHC: 32 g/dL (ref 30.0–36.0)
MCV: 93.1 fL (ref 80.0–100.0)
Platelets: 86 10*3/uL — ABNORMAL LOW (ref 150–400)
RBC: 3.63 MIL/uL — ABNORMAL LOW (ref 3.87–5.11)
RDW: 16.9 % — ABNORMAL HIGH (ref 11.5–15.5)
WBC: 4.1 10*3/uL (ref 4.0–10.5)
nRBC: 0 % (ref 0.0–0.2)

## 2019-10-06 LAB — AFP TUMOR MARKER: AFP, Serum, Tumor Marker: 4.1 ng/mL (ref 0.0–8.3)

## 2019-10-06 NOTE — Progress Notes (Signed)
PT Cancellation Note  Patient Details Name: Judy Meza MRN: [id number] DOB: 02/06/1960   Cancelled Treatment:    Reason Eval/Treat Not Completed: PT screened, no needs identified, will sign off Per OT, no skilled acute PT needs at this time. Will sign off. If needs change, please re-consult.   Lou Miner, DPT  Acute Rehabilitation Services  Pager: [phone] Office: [phone]    Rudean Hitt 10/06/2019, 4:18 PM

## 2019-10-06 NOTE — Progress Notes (Addendum)
Subjective:  Ms. Polanco was evaluated at bedside this morning, overnight there were no acute events. She reports she is doing well overall. Her throat pain secondary to her EGD has improved and is no longer bothering her. Her leg swelling and pain has resolved as well. She denies chest pain, shortness of breath, abdominal pain, hematochezia.    Objective:  Vital signs in last 24 hours: Vitals:   10/05/19 2354 10/06/19 0440 10/06/19 0732 10/06/19 1122  BP: 122/76 (!) 135/93 114/86 130/83  Pulse: 80 77 78 87  Resp: 16 16 16 16   Temp: 98.8 F (37.1 C) 98.7 F (37.1 C) 98.2 F (36.8 C) 97.9 F (36.6 C)  TempSrc: Oral Oral Oral Oral  SpO2: 93% 94% 94% 99%  Weight:  56.6 kg    Height:       Weight change: -7.691 kg  Intake/Output Summary (Last 24 hours) at 10/06/2019 1435 Last data filed at 10/06/2019 1000 Gross per 24 hour  Intake 2853.25 ml  Output --  Net 2853.25 ml   PHYSICAL EXAMINATION:  General Appearance: The patient is a cachetic appearing female, in no acute distress.   HEENT: Extraocular muscles are intact. Pupils are round and reactive to light. Icteric sclera.  Mouth: Oral mucosa is pink and moist.  Neck: Supple. Trachea is midline, no tracheostomy tube in place. There is no jugular venous distention noted. There are no carotid bruits noted. There are no palpable masses.  Lungs: Harsh lungs sounds heard posteriorly, possibly due to phlegm in her trachea. There is no crepitus on palpation.  Heart: Regular rate and rhythm, S1/S2. No murmurs are noted. There are no lifts, heaves or thrills noted on palpation.  Abdomen: Soft and nontender. There are good bowel sounds. There is no rebound or guarding. There is no evidence of hernia.  Skin: Scars on RUE and LLE reportedly from IVDU. Warm and dry with good turgor.  MSK: There is no clubbing, cyanosis, or edema Neuro: Cranial nerves II through XII are grossly intact.  Psych: The patient is alert and oriented to person,  place and time. There is no apparent mood disorder.   Assessment/Plan:  Principal Problem:   Secondary esophageal varices with bleeding (HCC) Active Problems:   COPD with chronic bronchitis (HCC)   Cirrhosis of liver (HCC)   Pancytopenia, acquired ([place])   Dementia without behavioral disturbance (HCC)   Chronic hepatitis C without hepatic coma (HCC)   History of laryngeal cancer   Decompensated hepatic cirrhosis (HCC)   Acute on chronic anemia   Esophageal stricture   Cirrhosis ([place])   Ms. Moritz is a 59 year old female with PMH of Hep C cirrhosis, COPD, laryngeal cancer, CKD, dementia, anemia, and previous substance abuse presenting who presented to the ED with encephalopathy after an unwitnessed fall and found to have acute on chronic anemia 2/2 grade II esophageal varices which have been banded. She is continuing to improve clinically.    #Symptomatic Acute on chronic anemia 2/2 variceal bleed Hgb on admission was 6.6 with baseline hgb around 9. Per sister, pt was transfused twice in [place] last year. Positive FOBT. Platelet count low but stable at 70. No known history of GI bleed, no hematochezia or melena. -- She was given 1 unit of pRBC in the ED. -- Hbg 10.8 from 10/06/19 at 0748 --EGD from 10/04/19 showed grade II varices in lower 1/3 of esophagus, 3 bands placed by GI, also angioectasia in stomach --IV Octreotide 50 mcg/hr for 72 hours total, finishing tonight --  Start Nadolol 40mg  qD tomorrow --IV Ceftriaxone 2 g 200 mL/hr for 5 days total, ABX end date 10/07/19 --IV protonix changed to PO 40 qD --appreciate GI recs --PT/OT consulted     #Decompensated cirrhosis  Cirrhosis 2/2 to hep C infection and chronic alcohol abuse. On lactulose and rifaximin at home. Mental status has improved since admission. Normal white count. No ascites seen on U/S.  --GI consulted, appreciate recs --Sofosbuvir-Velpatasvir 400-100 mg daily --Rifaximin 550 mg po daily --Lactulose 20 g po  TID --Spinolactone 50 mg po daily --Thiamine 656 mg daily --Folic acid 1 mg daily --Continue PO lasix 40 QD --F/u AM CMP   #Chronic Thrombocytopenia --Platelets currently stable around 70 --Averages between 55 - 84 since 04/29/19 --No petechiae noted on exam --Monitor with CBC   #CKD III Patient follows up with [facility]. BUN/sCr of 21/1.72 on admission up from 15/1.40 one month ago. Patient is on lasix 40 mg at home, but unsure if she has been taking it consistently.  --Resume home diuretics of furosemide and spironolactone as above --Strict I & Os --F/U AM BMP   #Fall Patient sustained an unwitnessed fall at home. Patient had loss of consciousness but denied hitting head. CT head neg for acute intracranial pathology. CT cervical spine and Xray R foot neg for fractures.  --Tylenol 650 mg q6h prn for pain --Fall precautions --PT/OT eval and treat --TTE not concerning for cardiac etiology of fall   #Dementia Patient found to have increased confusion on admission. Patient A&Ox4 on exam. Patient at high risk for delirium. Reorient as needed.  --Delirium precautions -Continue rifaximin and lactulose for hepatic encephalopathy prophylaxis   #Laryngeal cancer s/p tracheostomy Reportedly currently in remission.  Lurline Idol has been removed, tracheostomy site remains open, no trach care needed other than dressing over site. Lungs sounds were harsh today, suggesting phlegm remaining in trachea and bronchial tree.  --Will attempt to suction tracheostomy site   #Hx of alcohol use Ethanol level was normal on admission.  --CIWA w/o ativan   LOS: 3 days   [name], Medical Student 10/06/2019, 2:35 PM

## 2019-10-06 NOTE — TOC Progression Note (Signed)
Transition of Care [facility]) - Progression Note    Patient Details  Name: Judy Meza MRN: [id number] Date of Birth: 1960-08-03  Transition of Care [facility]) CM/SW [place], RN Phone Number: 10/06/2019, 10:47 AM  Clinical Narrative:    Case manager received phone call from Charisse Klinefelter sister of patient.  Mrs. Lina Sar expressed concerns about patient being discharged to home and is requesting SNF placement due to patient drinking alcohol at home, not taking care of self and not eating foods from her prescribed diet.  Case manager notified provider [place] about call and also to request PT/OT evaluation, also called Ebony Hail SW about possible skilled nursing facility placement.   Expected Discharge Plan: [place] Barriers to Discharge: No Barriers Identified  Expected Discharge Plan and Services Expected Discharge Plan: [place]   Discharge Planning Services: CM Consult                                           Social Determinants of Health (SDOH) Interventions    Readmission Risk Interventions Readmission Risk Prevention Plan 10/05/2019 08/07/2019 06/06/2019  Transportation Screening Complete Complete Complete  HRI or Home Care Consult - Not Complete -  HRI or Home Care Consult comments - needs SNF -  Social Work Consult for [place] Planning/Counseling - Complete -  Palliative Care Screening - Not Applicable -  Medication Review Press photographer) Complete Complete Complete  PCP or Specialist appointment within 3-5 days of discharge Complete - -  [place] or Home Care Consult Complete - Complete  SW Recovery Care/Counseling Consult Complete - Complete  Palliative Care Screening Not Applicable - Not [place] Not Applicable - Complete

## 2019-10-06 NOTE — Evaluation (Signed)
Occupational Therapy Evaluation Patient Details Name: Judy Meza MRN: [id number] DOB: 03/12/1960 Today's Date: 10/06/2019    History of Present Illness 59 y.o. female with medical history significant of COPD, throat cancer status post tracheostomy, cirrhosis of liver, chronic kidney disease stage III, hypertension, alcohol abuse in remission, tobacco abuse, history of IVDU in remission, hepatitis C, depression, dementia.  Pt admitted for Acute hepatic encephalopathy, increased weakness, and recurrent falls at home   Clinical Impression   Patient admitted for the above diagnosis.  She plans on going back to her home with prior level of support from her family.  Sister's assist with meds, community mobility, bill payment, groceries, occasional assist with ADL, meals and home management depending on how she is feeling.  Generally she is able to complete ADL, light meal prep and light home management with increased time.  She is presenting at or near her baseline function for ADL, and in room mobility.  [facility] for home safety eval, and [facility] for medical management could be considered.  Patient has a timed pill box that alerts her for meds, and she participates in an adult day program M, Tues, Th and Fri every week from 8:30 to 3:30pm.  No further acute OT needs.  She has no immediate DME needs.      Follow Up Recommendations  No OT follow up    Equipment Recommendations  None recommended by OT    Recommendations for Other Services       Precautions / Restrictions Precautions Precautions: Fall Restrictions Weight Bearing Restrictions: No      Mobility Bed Mobility Overal bed mobility: Independent                Transfers Overall transfer level: Modified independent Equipment used: Rolling walker (2 wheeled)                  Balance Overall balance assessment: No apparent balance deficits (not formally assessed)                                          ADL either performed or assessed with clinical judgement   ADL Overall ADL's : Needs assistance/impaired Eating/Feeding: Independent;Sitting   Grooming: Wash/dry hands;Wash/dry face;Independent;Sitting   Upper Body Bathing: Sitting;Modified independent   Lower Body Bathing: Sit to/from stand;Modified independent       Lower Body Dressing: Set up;Sit to/from stand   Toilet Transfer: Modified Independent;RW           Functional mobility during ADLs: Modified independent;Rolling walker General ADL Comments: multiple lines and leads impact her mobility.     Vision Baseline Vision/History: Wears glasses Wears Glasses: At all times Patient Visual Report: No change from baseline       Perception     Praxis      Pertinent Vitals/Pain Pain Assessment: No/denies pain     Hand Dominance Left   Extremity/Trunk Assessment Upper Extremity Assessment Upper Extremity Assessment: Overall WFL for tasks assessed   Lower Extremity Assessment Lower Extremity Assessment: Defer to PT evaluation   Cervical / Trunk Assessment Cervical / Trunk Assessment: Normal   Communication     Cognition Arousal/Alertness: Awake/alert Behavior During Therapy: WFL for tasks assessed/performed Overall Cognitive Status: Within Functional Limits for tasks assessed  General Comments: States MD has told her she has begining stages of Dementia.  Has help as needed for meds and bill payment.   General Comments                  Home Living Family/patient expects to be discharged to:: Private residence Living Arrangements: Alone Available Help at Discharge: Family Type of Home: [place]: One level     Bathroom Shower/Tub: Teacher, early years/pre: Handicapped height     Home Equipment: Environmental consultant - 2 wheels;Cane - single point;Shower seat;Hand held shower head          Prior  Functioning/Environment Level of Independence: Needs assistance  Gait / Transfers Assistance Needed: using 2WRW outside, holkds onto objects in her environment. ADL's / Homemaking Assistance Needed: mother brings food for pt. Sister pays all bills and sets up meds.   Comments: Adult day center 4 days/wk.        OT Problem List: Decreased safety awareness;Impaired balance (sitting and/or standing)      OT Treatment/Interventions:      OT Goals(Current goals can be found in the care plan section) Acute Rehab OT Goals Patient Stated Goal: Return home OT Goal Formulation: With patient Time For Goal Achievement: 10/09/19 Potential to Achieve Goals: Good  OT Frequency:     Barriers to D/C:  none identified          Co-evaluation              AM-PAC OT "6 Clicks" Daily Activity     Outcome Measure Help from another person eating meals?: None Help from another person taking care of personal grooming?: None Help from another person toileting, which includes using toliet, bedpan, or urinal?: None Help from another person bathing (including washing, rinsing, drying)?: A Little Help from another person to put on and taking off regular upper body clothing?: None Help from another person to put on and taking off regular lower body clothing?: None 6 Click Score: 23   End of Session Equipment Utilized During Treatment: Rolling walker  Activity Tolerance: Patient tolerated treatment well Patient left: Other (comment) (sittig EOB as found.)  OT Visit Diagnosis: Muscle weakness (generalized) (M62.81)                Time: 6761-9509 OT Time Calculation (min): 22 min Charges:  OT General Charges $OT Visit: 1 Visit OT Evaluation $OT Eval Moderate Complexity: 1 Mod  10/06/2019  Rich, OTR/L  Acute Rehabilitation Services  Office:  [place] 10/06/2019, 2:39 PM

## 2019-10-06 NOTE — Care Management (Signed)
10-06-19 1538 Received a call from the [facility]- regarding family wanted to stop services with [facility] at the beginning of the month. Family wants patient to go to a facility long term. If patient is stable to transition home on Saturday- please call [phone]- for [facility] transportation if family cannot support. [facility] is working to try to find a long term facility for this patient. Patient has a legal guardian sister Shirlean Mylar that lives in [place]. Case Manager will continue to follow for transition of care needs. Bethena Roys, RN,BSN Case Manager

## 2019-10-07 LAB — CBC
HCT: 34.1 % — ABNORMAL LOW (ref 36.0–46.0)
Hemoglobin: 10.9 g/dL — ABNORMAL LOW (ref 12.0–15.0)
MCH: 30.3 pg (ref 26.0–34.0)
MCHC: 32 g/dL (ref 30.0–36.0)
MCV: 94.7 fL (ref 80.0–100.0)
Platelets: 85 10*3/uL — ABNORMAL LOW (ref 150–400)
RBC: 3.6 MIL/uL — ABNORMAL LOW (ref 3.87–5.11)
RDW: 17.1 % — ABNORMAL HIGH (ref 11.5–15.5)
WBC: 4.1 10*3/uL (ref 4.0–10.5)
nRBC: 0 % (ref 0.0–0.2)

## 2019-10-07 LAB — COMPREHENSIVE METABOLIC PANEL
ALT: 23 U/L (ref 0–44)
AST: 43 U/L — ABNORMAL HIGH (ref 15–41)
Albumin: 2.7 g/dL — ABNORMAL LOW (ref 3.5–5.0)
Alkaline Phosphatase: 73 U/L (ref 38–126)
Anion gap: 10 (ref 5–15)
BUN: 19 mg/dL (ref 6–20)
CO2: 24 mmol/L (ref 22–32)
Calcium: 9.1 mg/dL (ref 8.9–10.3)
Chloride: 104 mmol/L (ref 98–111)
Creatinine, Ser: 1.79 mg/dL — ABNORMAL HIGH (ref 0.44–1.00)
GFR calc Af Amer: 36 mL/min — ABNORMAL LOW (ref >60)
GFR calc non Af Amer: 31 mL/min — ABNORMAL LOW (ref >60)
Glucose, Bld: 130 mg/dL — ABNORMAL HIGH (ref 70–99)
Potassium: 4.4 mmol/L (ref 3.5–5.1)
Sodium: 138 mmol/L (ref 135–145)
Total Bilirubin: 1.6 mg/dL — ABNORMAL HIGH (ref 0.3–1.2)
Total Protein: 7.3 g/dL (ref 6.5–8.1)

## 2019-10-07 MED ORDER — NADOLOL 20 MG PO TABS
20.0000 mg | ORAL_TABLET | Freq: Every day | ORAL | Status: DC
  Administered 2019-10-07: 20 mg via ORAL
  Filled 2019-10-07 (×2): qty 1

## 2019-10-07 MED ORDER — NADOLOL 40 MG PO TABS: 40.0000 mg | ORAL_TABLET | Freq: Every day | ORAL | Status: DC

## 2019-10-07 NOTE — Progress Notes (Signed)
Subjective: Patient was seen and evaluated this morning. She is doing well. She was noted to have difficulty speaking due to her trach. Still has some throat pain after endoscopy. Did not sleep well due to others waking her up overnight. No n/v/abdominal pain. Denies any abdominal pain nausea, vomiting, bloody bowel movement or melena. Feeling well and eager to go home. Patient says she told her sister she is going home; she says she spoke with her yesterday and she was going to come pick her up if she was clear to go  Objective:  Vital signs in last 24 hours: Vitals:   10/07/19 0620 10/07/19 0740 10/07/19 1122 10/07/19 1610  BP: 108/66 128/88  133/86  Pulse: 85 74 86 77  Resp: 18 18  18   Temp: 98 F (36.7 C) 98 F (36.7 C) 98.5 F (36.9 C) 98.4 F (36.9 C)  TempSrc: Oral Oral Oral Oral  SpO2: 96% 100% 94% 97%  Weight: 56 kg     Height:       Constitutional: No acute distress.  Cardiovascular: RRR, nl S1S2, no murmur,  no LEE Respiratory: Trach in place. Effort normal and breath sounds normal. No respiratory distress. No wheezes.  GI: Soft. Bowel sounds are normal. No distension. There is no tenderness.  Neurological: Is alert and oriented x 3  Skin: Not diaphoretic. No erythema. Scars of previous ulcers on left legs  Assessment/Plan:  Principal Problem:   Secondary esophageal varices with bleeding (HCC) Active Problems:   COPD with chronic bronchitis (HCC)   Cirrhosis of liver (HCC)   Pancytopenia, acquired ([place])   Dementia without behavioral disturbance (HCC)   Chronic hepatitis C without hepatic coma (HCC)   History of laryngeal cancer   Decompensated hepatic cirrhosis (HCC)   Acute on chronic anemia   Esophageal stricture   Cirrhosis ([place])  Ms. Schellinger is a 59 year old female with PMH of Hep C cirrhosis, COPD, laryngeal cancer, CKD, dementia, anemia, and previous substance abuse presenting who presented to the ED with encephalopathy after an unwitnessed fall and  found to have acute on chronic anemia 2/2 grade II esophageal varices which have been banded. She is continuing to improve clinically.    #Symptomatic Acute on chronic anemia 2/2 variceal bleed Hgb on admission was 6.6 with baseline hgb around 9. Per sister, pt was transfused twice in [place] last year. Positive FOBT. Platelet count low but stable at 70. No known history of GI bleed, no hematochezia or melena.  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 On lactulose and rifaximin at home. Mental status has improved since admission. Normal white count. No ascites seen on U/S.   -Continue Sofosbuvir-Velpatasvir 400-100 mg daily -Continue Rifaximin 550 mg po daily, Lactulose 20 g po TID -Continue Lasix 40 mg QD and Spinolactone 50 mg po daily -Continue Thiamine 245 mg daily and Folic acid 1 mg daily -Starting Nadolol 20 mg QD today. (will monitor BP). If tolerates will increase to 40 mg QD. -daily CMP and cbc -finishing 5 days of Ceftriaxone  for sbp ppx  #CKD III Patient follows up with [facility]. BUN/sCr of 21/1.72 on admission up from 15/1.40 one month ago. Patient is on lasix 40 mg at home, but unsure if she has been taking it consistently.  -Continue furosemide and spironolactone as above -Strict I & Os -F/U AM BMP   #Dementia #Fall TEE unremarkable for underlying etiology that could have caused syncope. Patient found to have increased confusion on admission. Patient A&Ox4 on exam. Patient at high risk for delirium.  Reorient as needed.  -Delirium precautions -Continue rifaximin and lactulose for hepatic encephalopathy prophylaxis Patient sustained an unwitnessed fall at home. Patient had loss of consciousness but denied hitting head. CT head neg for acute intracranial pathology. CT cervical spine and Xray R foot neg for fractures.  -Tylenol 650 mg q6h prn for pain -Fall precautions -PT/OT did nor recommend pt f/u or snf placement -family (sister:Robin) who is legal guardian can not take care of pt at home and asking to send her to long term facility. She mentions that pt needs to be supervised for taking her meds and also make sure she does not drink. Patient notified. -Appreciate care manager f/u    #Laryngeal cancer s/p tracheostomy Reportedly currently in remission.  Lurline Idol has been removed, tracheostomy site remains open, no trach care needed other than dressing over site. Lungs sounds were harsh today, suggesting phlegm remaining in trachea and bronchial tree.  -Will attempt to suction tracheostomy site  -continue trach care  #Hx of alcohol use Ethanol level was normal on admission.  -CIWA w/o ativan   LOS: 4 days   Dewayne Hatch, MD 10/07/2019, 7:20 PM

## 2019-10-07 NOTE — Progress Notes (Signed)
Contacted pt's sister Shirlean Mylar) at [phone] to discuss the D/C plan. She reports that she spoke to the doctor today and pt may be D/C tomorrow. She is concern because her sister is not taking her medications as prescribed and she may start drinking again. She is not able to provide 24/7 care because she takes care of her elderly mother who is not doing well. Per PT note, no needs identified, no skilled acute PT needs at this time. She reports that we need to contact [facility] for transportation if she is D/C tomorrow. Will continue to f/u to assist with the D/C plan.

## 2019-10-08 MED ORDER — NADOLOL 40 MG PO TABS
40.0000 mg | ORAL_TABLET | Freq: Every day | ORAL | Status: DC
  Administered 2019-10-08: 40 mg via ORAL
  Filled 2019-10-08: qty 1

## 2019-10-08 MED ORDER — NADOLOL 40 MG PO TABS
40.0000 mg | ORAL_TABLET | Freq: Every day | ORAL | Status: DC
  Filled 2019-10-08: qty 1

## 2019-10-08 MED ORDER — NADOLOL 40 MG PO TABS: 40.0000 mg | ORAL_TABLET | Freq: Every day | ORAL | Status: DC

## 2019-10-08 MED ORDER — POLYVINYL ALCOHOL 1.4 % OP SOLN
1.0000 [drp] | OPHTHALMIC | Status: DC | PRN
  Administered 2019-10-08: 1 [drp] via OPHTHALMIC
  Filled 2019-10-08: qty 15

## 2019-10-08 MED ORDER — NADOLOL 40 MG PO TABS
40.0000 mg | ORAL_TABLET | Freq: Every day | ORAL | 0 refills | Status: DC
Start: 2019-10-09 — End: 2020-09-12

## 2019-10-08 MED ORDER — FOLIC ACID 1 MG PO TABS: 1.0000 mg | ORAL_TABLET | Freq: Every day | ORAL | 0 refills | Status: AC

## 2019-10-08 NOTE — Discharge Summary (Addendum)
Name: Judy Meza MRN: [id number] DOB: [birth year] 59 y.o. PCP: Waylan Rocher, MD  Date of Admission: 10/03/2019  1:14 PM Date of Discharge: 10/08/19 Attending Physician: Oda Kilts, MD  Discharge Diagnosis: 1. Alcohol use disorder 2. Esophageal varices, s/p banding 3. Duodenal aniogectasia, s/p argon laser coagulation 4. 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 Progression of CKD vs AKI 6. Dementia  Discharge Medications: Allergies as of 10/08/2019   No Known Allergies      Medication List     TAKE these medications    albuterol (2.5 MG/3ML) 0.083% nebulizer solution Commonly known as: PROVENTIL Take 3 mLs (2.5 mg total) by nebulization every 6 (six) hours as needed for wheezing or shortness of breath.   albuterol 108 (90 Base) MCG/ACT inhaler Commonly known as: VENTOLIN HFA Inhale 2 puffs into the lungs every 6 (six) hours as needed for wheezing or shortness of breath.   eucerin cream Apply 1 application topically as needed for dry skin (and itching).   famotidine 20 MG tablet Commonly known as: PEPCID Take 20 mg by mouth daily.   feeding supplement (ENSURE ENLIVE) Liqd Take 237 mLs by mouth 2 (two) times daily between meals.   folic acid 1 MG tablet Commonly known as: FOLVITE Take 1 tablet (1 mg total) by mouth daily. Start taking on: October 09, 2019   furosemide 20 MG tablet Commonly known as: LASIX Take 40 mg by mouth daily.   gabapentin 100 MG capsule Commonly known as: NEURONTIN Take 200 mg by mouth 3 (three) times daily.   hydrOXYzine 25 MG tablet Commonly known as: ATARAX/VISTARIL Take 25 mg by mouth every 6 (six) hours as needed for anxiety or itching. Notes to patient: As Before, As Directed   hydrOXYzine 10 MG tablet Commonly known as: ATARAX/VISTARIL Take 1 tablet (10 mg total) by mouth 3 (three) times daily as needed for itching or anxiety. Notes to patient: As Before, As Directed   lactulose 10  GM/15ML solution Commonly known as: CHRONULAC Take 30 mLs (20 g total) by mouth 3 (three) times daily.   loratadine 10 MG tablet Commonly known as: CLARITIN Take 1 tablet (10 mg total) by mouth daily.   LORazepam 0.5 MG tablet Commonly known as: ATIVAN Take 0.5 mg by mouth at bedtime as needed for anxiety.   memantine 5 MG tablet Commonly known as: NAMENDA Take 1 tablet (5 mg total) by mouth 2 (two) times daily. What changed: when to take this   nadolol 40 MG tablet Commonly known as: CORGARD Take 1 tablet (40 mg total) by mouth daily. Start taking on: October 09, 2019   nystatin 100000 UNIT/ML suspension Commonly known as: MYCOSTATIN Take 4 mLs by mouth in the morning, at noon, in the evening, and at bedtime. X 14 days   pantoprazole 40 MG tablet Commonly known as: PROTONIX Take 40 mg by mouth daily.   PARoxetine 40 MG tablet Commonly known as: PAXIL Take 1 tablet (40 mg total) by mouth every morning.   Pataday 0.2 % Soln Generic drug: Olopatadine HCl Place 1 drop into both eyes daily as needed (allergic conjunctivitis).   rifaximin 550 MG Tabs tablet Commonly known as: XIFAXAN Take 1 tablet (550 mg total) by mouth 2 (two) times daily. What changed: when to take this   sodium bicarbonate 650 MG tablet Take 1 tablet (650 mg total) by mouth 2 (two) times daily.   Sofosbuvir-Velpatasvir 400-100 MG Tabs Commonly known as: Epclusa Take 1 tablet by  mouth daily.   spironolactone 100 MG tablet Commonly known as: ALDACTONE Take 0.5 tablets (50 mg total) by mouth daily.   thiamine 100 MG tablet Take 1 tablet (100 mg total) by mouth daily.   VITAMIN D-3 PO Take 1,250 mcg by mouth once a week. Notes to patient: You have not been receiving this med in the hospital. You can resume taking as before.         Disposition and follow-up:   Ms.Judy Meza was discharged from [facility] in Good condition.  At the hospital follow up visit please  address:  1.  Follow up     A. Alcohol use disorder - counseled on cessation, assess for recurrence     B. Esophageal varices, s/p banding - started on nadolol 40mg , f/u for EGD in 6 weeks     C. Duodenal aniogectasia, s/p argon laser coagulation - f/u EGD in 6 weeks     D. Decompensated cirrhosis 2/2 hep C and alcohol use - continued home meds including lactulose, rifaximin, lasix, spironolactone, epclusa     E. Progression of CKD vs AKI - recheck creatinine     F. Dementia - per sister who is legal guardian, patient unable to care for herself. [facility] to help with long term facility placement  2.  Labs / imaging needed at time of follow-up: EGD, CBC, CMP  3.  Pending labs/ test needing follow-up: none  Follow-up Appointments:   Hospital Course by problem list: Symptomatic Acute on chronic anemia 2/2 variceal bleed Fall Patient was found by sister having "fallen" getting out of bed with her legs on the floor but upper body on her bed, reportedly unable to get back up. Hgb on admission was 6.6 with baseline hgb around 9. Platelet count low but stable at 70. Denied any observed source of bleeding. Received 1 unit pRCBs and hgb was stable since then. EGD performed on 10/04/19 showed grade II varices in lower 1/3 of esophagus (3 bands placed) and also single angioectasia in duodenum (treated with argon plasma coagulation). Treated with octreotide for 3 days, and ceftriaxone for 5 days for SBP prophylaxis. After finishing octreotide, was started on Nadolol. Hgb stable and no source of bleed for remained of hospitalization. TEE with EF 55-60% and grade 2 diastolic dysfunction, unlikely to have contributed to fall. CT head neg for acute intracranial pathology. RLE xrays and CT spine unremarkable. Thought most likely 2/2 to acute anemia.   Decompensated cirrhosis   Cirrhosis 2/2 to hep C infection and chronic alcohol abuse. Continued on Sofosbuvir-Velpatasvir 400-100. Ammonia 45 on admission, on  lactulose and rifaximin at home. Somewhat confused initially but improved, remained alert and oriented for rest of admission. Had roughly 3 BMs a day which were light brown in color. No ascites seen on U/S. Was treated with ceftriaxone for SBP prophylaxis. Discharged with home Lasix and spironolactone doses.   Progression of CKD III vs AKI Cr of 1.72 on admission, up from 1.40 one month ago. Remained stable during admission. Patient is on lasix 40 mg and spironolactone at home, but unsure if she has been taking it consistently.    Dementia Patient found to have increased confusion on admission, but was alert and oriented by second day and remained so for rest of this admission. CT head neg for acute intracranial pathology. Is on nemanda at home. Her 2 sisters are her legal guardian due to dementia. Sister, Shirlean Mylar, feels that patient needs a facility for long term care  to help with medication supervision and with abstinence from alcohol. PT and OT here deny any acute SNF needs. Discharged to home with [facility] to follow for facility placement.   Discharge Vitals:   BP 127/85 (BP Location: Right Arm)   Pulse 71   Temp 98.5 F (36.9 C) (Oral)   Resp 20   Ht 5\' 2"  (1.575 m)   Wt 57.4 kg   SpO2 100%   BMI 23.16 kg/m   Pertinent Labs, Studies, and Procedures:  9/22 EGD [facility] Patient Name: Judy Meza Procedure Date : 10/04/2019 MRN: [id number] Attending MD: Gerrit Heck , MD Date of Birth: 21-Mar-1960 CSN: [id number] Age: 89 Admit Type: Outpatient Procedure:                Upper GI endoscopy Indications:              Acute post hemorrhagic anemia, Heme positive stool,                            Cirrhosis with suspected esophageal varices                           MELD: 20                           Child Pugh: C (10 points) Providers:                Gerrit Heck, MD, Benay Pillow, RN, Elspeth Cho Tech., Technician Referring MD:               Medicines:                Monitored Anesthesia Care Complications:            No immediate complications. Estimated Blood Loss:     Estimated blood loss was minimal. Procedure:                Pre-Anesthesia Assessment:                           - Prior to the procedure, a History and Physical                            was performed, and patient medications and                            allergies were reviewed. The patient's tolerance of                            previous anesthesia was also reviewed. The risks                            and benefits of the procedure and the sedation                            options and risks were discussed with the patient.  All questions were answered, and informed consent                            was obtained. Prior Anticoagulants: The patient has                            taken no previous anticoagulant or antiplatelet                            agents. ASA Grade Assessment: III - A patient with                            severe systemic disease. After reviewing the risks                            and benefits, the patient was deemed in                            satisfactory condition to undergo the procedure.                           After obtaining informed consent, the endoscope was                            passed under direct vision. Throughout the                            procedure, the patient's blood pressure, pulse, and                            oxygen saturations were monitored continuously. The                            GIF-H190 ([id number]) Olympus gastroscope was                            introduced through the mouth, and advanced to the                            second part of duodenum. The upper GI endoscopy was                            accomplished without difficulty. The patient                            tolerated the procedure well. Scope In: Scope Out: Findings:      Grade II  varices were found in the lower third of the esophagus. They       were 5 mm in largest diameter and 2 columns. Based on clinical       presentation and the advanced Child Pugh classification, the decision       was made for endoscopic variceal ligation. Three bands were successfully       placed with complete eradication, resulting in deflation of varices.  There was no bleeding at the end of the procedure.      One benign-appearing, intrinsic mild stenosis was found in the upper       third of the esophagus. The stenosis was traversed easily with the       endoscope, but with small mucosal rent with passage of the endoscope       with banding cap in place.      Mild inflammation characterized by erythema was found in the gastric       body and in the gastric antrum.      A single small angioectasia without bleeding was found in the second       portion of the duodenum. Coagulation for hemostasis using argon plasma       at 2 liters/minute and 20 watts was successful. Estimated blood loss:       none.      The duodenal bulb was normal. Impression:               - Grade II esophageal varices. Completely                            eradicated. Banded.                           - Benign-appearing esophageal stenosis in the                            proximal esophagus/UES. Location most likely                            secondary to post-operative and post radiation                            scarring. This was dilated with the passage of the                            endoscope with band cap in place, with appropriate                            small mucosal rent. No bleeding at the conclusion                            of the study.                           - Portal hypertensive gastropathy.                           - A single non-bleeding angioectasia in the                            duodenum. Treated with argon plasma coagulation                            (APC).                            - Normal duodenal  bulb.                           - No specimens collected. Recommendation:           - Return patient to hospital ward for ongoing care.                           - Clear liquid diet.                           - Continue present medications.                           - Repeat upper endoscopy in 6 weeks for retreatment.                           - Continue serial Hgb/Hct checks with additional                            blood prodcuts as needed. Would not transfuse above                            Hgb 8.                           - Resume octreotide, Rocephin, and PPI.   DG Tibia/Fibula Right  Result Date: 10/03/2019 CLINICAL DATA:  Right lower leg pain and swelling after recent fall EXAM: RIGHT TIBIA AND FIBULA - 2 VIEW COMPARISON:  None. FINDINGS: There is no evidence of fracture. No malalignment. No suspicious bone lesion. Mild diffuse soft tissue edema. No focal soft tissue swelling. Scattered vascular calcifications. IMPRESSION: 1. No acute osseous abnormality of the right tibia or fibula. 2. Mild diffuse soft tissue edema, nonspecific. Electronically Signed   By: Davina Poke D.O.   On: 10/03/2019 14:16   CT Head Wo Contrast  Result Date: 10/03/2019 CLINICAL DATA:  Altered mental status, EXAM: CT HEAD WITHOUT CONTRAST CT CERVICAL SPINE WITHOUT CONTRAST TECHNIQUE: Multidetector CT imaging of the head and cervical spine was performed following the standard protocol without intravenous contrast. Multiplanar CT image reconstructions of the cervical spine were also generated. COMPARISON:  None. FINDINGS: CT HEAD FINDINGS Brain: No evidence of acute infarction, hemorrhage, hydrocephalus, extra-axial collection or mass lesion/mass effect. Periventricular and deep white matter hypodensity. Vascular: No hyperdense vessel or unexpected calcification. Skull: Normal. Negative for fracture or focal lesion. Sinuses/Orbits: No acute finding. Other: None. CT CERVICAL SPINE  FINDINGS Alignment: Normal. Skull base and vertebrae: No acute fracture. No primary bone lesion or focal pathologic process. Soft tissues and spinal canal: No prevertebral fluid or swelling. No visible canal hematoma. Disc levels:  Intact. Upper chest: Frothy debris in the lower trachea. Other: None. IMPRESSION: 1. No acute intracranial pathology. Small-vessel white matter disease. 2. No fracture or static subluxation of the cervical spine. 3. Frothy debris in the lower trachea, concerning for aspiration. Electronically Signed   By: Eddie Candle M.D.   On: 10/03/2019 17:00   CT Cervical Spine Wo Contrast  Result Date: 10/03/2019 CLINICAL DATA:  Altered mental status, EXAM: CT HEAD WITHOUT CONTRAST CT CERVICAL SPINE WITHOUT CONTRAST TECHNIQUE: Multidetector CT imaging of the head and cervical  spine was performed following the standard protocol without intravenous contrast. Multiplanar CT image reconstructions of the cervical spine were also generated. COMPARISON:  None. FINDINGS: CT HEAD FINDINGS Brain: No evidence of acute infarction, hemorrhage, hydrocephalus, extra-axial collection or mass lesion/mass effect. Periventricular and deep white matter hypodensity. Vascular: No hyperdense vessel or unexpected calcification. Skull: Normal. Negative for fracture or focal lesion. Sinuses/Orbits: No acute finding. Other: None. CT CERVICAL SPINE FINDINGS Alignment: Normal. Skull base and vertebrae: No acute fracture. No primary bone lesion or focal pathologic process. Soft tissues and spinal canal: No prevertebral fluid or swelling. No visible canal hematoma. Disc levels:  Intact. Upper chest: Frothy debris in the lower trachea. Other: None. IMPRESSION: 1. No acute intracranial pathology. Small-vessel white matter disease. 2. No fracture or static subluxation of the cervical spine. 3. Frothy debris in the lower trachea, concerning for aspiration. Electronically Signed   By: Eddie Candle M.D.   On: 10/03/2019 17:00   DG  Foot Complete Right  Result Date: 10/03/2019 CLINICAL DATA:  Right foot pain and swelling after recent fall EXAM: RIGHT FOOT COMPLETE - 3+ VIEW COMPARISON:  None. FINDINGS: No acute fracture or dislocation within the right foot. Joint spaces are relatively preserved. No erosion. Scattered vascular calcifications. No focal soft tissue swelling. IMPRESSION: No acute fracture or dislocation within the right foot. Electronically Signed   By: Davina Poke D.O.   On: 10/03/2019 14:14   ECHOCARDIOGRAM COMPLETE  Result Date: 10/04/2019    ECHOCARDIOGRAM REPORT   Patient Name:   Judy Meza Date of Exam: 10/04/2019 Medical Rec #:  [id number]     Height:       62.0 in Accession #:    [id number]    Weight:       141.8 lb Date of Birth:  11-Nov-1960     BSA:          1.652 m Patient Age:    52 years      BP:           124/90 mmHg Patient Gender: F             HR:           89 bpm. Exam Location:  Inpatient Procedure: 2D Echo, Cardiac Doppler and Color Doppler                                MODIFIED REPORT:   This report was modified by Gwyndolyn Kaufman MD on 10/04/2019 due to Prior                                     study.  Indications:     Syncope  History:         Patient has prior history of Echocardiogram examinations, most                  recent 06/05/2019. Signs/Symptoms:Syncope; Risk Factors:Former                  Smoker. Hep. C, alcohol abuse, CKD, dementia, substance abuse.  Sonographer:     Dustin Flock Referring Phys:  [id number] Oda Kilts Diagnosing Phys: Gwyndolyn Kaufman MD IMPRESSIONS  1. Left ventricular ejection fraction, by estimation, is 55 to 60%. The left ventricle has normal function. The left ventricle has no regional wall motion abnormalities. Left ventricular diastolic parameters  are consistent with Grade II diastolic dysfunction (pseudonormalization).  2. Right ventricular systolic function is normal. The right ventricular size is normal. There is mildly elevated pulmonary artery  systolic pressure.  3. Left atrial size was moderately dilated.  4. The mitral valve is normal in structure. Trivial mitral valve regurgitation.  5. The aortic valve is normal in structure. Aortic valve regurgitation is not visualized.  6. The inferior vena cava is normal in size with greater than 50% respiratory variability, suggesting right atrial pressure of 3 mmHg. Comparison(s): No significant change from prior study. FINDINGS  Left Ventricle: Left ventricular ejection fraction, by estimation, is 55 to 60%. The left ventricle has normal function. The left ventricle has no regional wall motion abnormalities. The left ventricular internal cavity size was normal in size. There is  no left ventricular hypertrophy. Left ventricular diastolic parameters are consistent with Grade II diastolic dysfunction (pseudonormalization). Right Ventricle: The right ventricular size is normal. No increase in right ventricular wall thickness. Right ventricular systolic function is normal. There is mildly elevated pulmonary artery systolic pressure. The tricuspid regurgitant velocity is 2.92  m/s, and with an assumed right atrial pressure of 3 mmHg, the estimated right ventricular systolic pressure is 60.1 mmHg. Left Atrium: Left atrial size was moderately dilated. Right Atrium: Right atrial size was normal in size. Pericardium: There is no evidence of pericardial effusion. Mitral Valve: The mitral valve is normal in structure. There is mild thickening of the mitral valve leaflet(s). Mild mitral annular calcification. Trivial mitral valve regurgitation. Tricuspid Valve: Estimated PASP 52mmHg +RAP. The tricuspid valve is normal in structure. Tricuspid valve regurgitation is mild. Aortic Valve: The aortic valve is normal in structure. Aortic valve regurgitation is not visualized. Pulmonic Valve: The pulmonic valve was normal in structure. Pulmonic valve regurgitation is not visualized. Aorta: The aortic root is normal in size and  structure. Venous: The inferior vena cava is normal in size with greater than 50% respiratory variability, suggesting right atrial pressure of 3 mmHg. IAS/Shunts: The atrial septum is grossly normal.  LEFT VENTRICLE PLAX 2D LVIDd:         3.70 cm  Diastology LVIDs:         2.95 cm  LV e' medial:    5.98 cm/s LV PW:         1.00 cm  LV E/e' medial:  14.8 LV IVS:        1.20 cm  LV e' lateral:   7.72 cm/s LVOT diam:     1.90 cm  LV E/e' lateral: 11.5 LV SV:         77 LV SV Index:   46 LVOT Area:     2.84 cm  RIGHT VENTRICLE RV Basal diam:  2.50 cm RV S prime:     7.72 cm/s TAPSE (M-mode): 3.0 cm LEFT ATRIUM             Index       RIGHT ATRIUM           Index LA diam:        3.30 cm 2.00 cm/m  RA Area:     10.10 cm LA Vol (A2C):   38.3 ml 23.19 ml/m RA Volume:   19.60 ml  11.87 ml/m LA Vol (A4C):   43.0 ml 26.03 ml/m LA Biplane Vol: 40.5 ml 24.52 ml/m  AORTIC VALVE LVOT Vmax:   109.00 cm/s LVOT Vmean:  78.100 cm/s LVOT VTI:    0.270 m  AORTA Ao Root  diam: 2.50 cm MITRAL VALVE                TRICUSPID VALVE MV Area (PHT): 7.16 cm     TR Peak grad:   34.1 mmHg MV Decel Time: 106 msec     TR Vmax:        292.00 cm/s MV E velocity: 88.80 cm/s MV A velocity: 127.00 cm/s  SHUNTS MV E/A ratio:  0.70         Systemic VTI:  0.27 m                             Systemic Diam: 1.90 cm Gwyndolyn Kaufman MD Electronically signed by Gwyndolyn Kaufman MD Signature Date/Time: 10/04/2019/12:15:57 PM    Final (Updated)    [place] ASCITES (ABDOMEN LIMITED)  Result Date: 10/03/2019 CLINICAL DATA:  Evaluate for ascites. EXAM: LIMITED ABDOMEN ULTRASOUND FOR ASCITES TECHNIQUE: Limited ultrasound survey for ascites was performed in all four abdominal quadrants. COMPARISON:  August 04, 2019 FINDINGS: No free fluid is seen within the 4 abdominal quadrants. IMPRESSION: No evidence of ascites. Electronically Signed   By: Virgina Norfolk M.D.   On: 10/03/2019 22:29   Discharge Instructions: Discharge Instructions     Call MD for:   extreme fatigue   Complete by: As directed    Call MD for:  persistant dizziness or light-headedness   Complete by: As directed    Diet - low sodium heart healthy   Complete by: As directed    Discharge instructions   Complete by: As directed    Ms. Judy Meza, it has been a pleasure taking care of. We believe that your symptoms were caused by blood loss from esophageal varices, which are due to the liver damage you have sustained from hepatitis C and your alcohol use. Please abstain from alcohol in the future. We also agree with your sister that you would benefit from a care facility. [facility] will follow and help arrange this.  Here are your discharge instructions. 1) START nadolol 40mg , 1 tablet daily 2) START folic acid 1mg , 1 tablet daily 3) Follow up with [facility] 4) Follow up with GI  Here are the medications that you will still need to take tonight after going home: Lactulose Rifaximin Namenda (memantine) Neurontin (gabapentin)   Increase activity slowly   Complete by: As directed        Signed: Andrew Au, MD 10/08/2019, 3:41 PM   Pager: [phone]

## 2019-10-08 NOTE — H&P (Deleted)
Deleted, wrong note type

## 2019-10-08 NOTE — Progress Notes (Signed)
Subjective:  Patient evaluated at bedside, states that she feels well this morning and wishes to go home. Denies bleeding from any source, CP, SOB, abd pain. Reports she had a stool that was light brown in color. Complains of eye dryness and itching, will give eye drops.   Objective:  Vital signs in last 24 hours: Vitals:   10/07/19 1958 10/08/19 0500 10/08/19 0632 10/08/19 0727  BP: 132/88  109/75 120/78  Pulse: 75  73 73  Resp: (!) 22  20 20   Temp: 98.2 F (36.8 C)  98.4 F (36.9 C) 98.3 F (36.8 C)  TempSrc: Oral  Oral Oral  SpO2: 99%   98%  Weight:  57.4 kg    Height:       Physical Exam Constitutional: no acute distress Head: atraumatic ENT: external ears normal, had tracheostomy but tube no in place, stoma is open to air, patient vocalizes fairly well by covering stoma with hand Eyes: EOMI Cardiovascular: regular rate and rhythm, normal heart sounds Pulmonary: effort normal, normal breath sounds bilaterally Abdominal: flat, nontender, no rebound tenderness, bowel sounds normal Skin: warm and dry Neurological: alert, no focal deficit Psychiatric: normal mood and affect   Assessment/Plan:  Principal Problem:   Secondary esophageal varices with bleeding (HCC) Active Problems:   COPD with chronic bronchitis (HCC)   Cirrhosis of liver (HCC)   Pancytopenia, acquired ([place])   Dementia without behavioral disturbance (HCC)   Chronic hepatitis C without hepatic coma (HCC)   History of laryngeal cancer   Decompensated hepatic cirrhosis (HCC)   Acute on chronic anemia   Esophageal stricture   Cirrhosis ([place])  Ms. Zemanek is a 59 year old female with PMH of Hep C cirrhosis, COPD, laryngeal cancer, CKD, dementia, anemia, and previous substance abuse presenting who presented to the ED with encephalopathy after an unwitnessed fall and found to have acute on chronic anemia 2/2 grade II esophageal varices which have been banded. She is continuing to improve clinically.      #Symptomatic Acute on chronic anemia 2/2 variceal bleed Hgb on admission was 6.6 with baseline hgb around 9. Platelet count low but stable at 70. No known history of GI bleed, no hematochezia or melena. Received 1 unit pRCBs annd hgb stable since then. EGD from 10/04/19 showed grade II varices in lower 1/3 of esophagus, 3 bands placed by GI, also angioectasia in stomach. -finished IV Octreotide 50 mcg/hr for 72 hours total, completed Ceftriaxone 5 days -increase Nadolol to 40 mg QD. -Continue PO protonix 40 qD  #Decompensated cirrhosis  #Chronic Thrombocytopenia Cirrhosis 2/2 to hep C infection and chronic alcohol abuse. On lactulose and rifaximin at home. Mental status has improved since admission. Normal white count. No ascites seen on U/S.  -Continue Sofosbuvir-Velpatasvir 400-100 mg daily -Continue Rifaximin 550 mg po daily, Lactulose 20 g po TID -Continue Lasix 40 mg QD and Spinolactone 50 mg po daily -Continue Thiamine 224 mg daily and Folic acid 1 mg daily -increase Nadolol to 40 mg QD. -daily CMP and cbc  #CKD III Patient follows up with [facility]. BUN/sCr of 21/1.72 on admission up from 15/1.40 one month ago. Patient is on lasix 40 mg at home, but unsure if she has been taking it consistently.  -Continue furosemide and spironolactone as above -Strict I & Os -daily BMP   #Dementia #Fall TEE unremarkable for underlying etiology that could have caused syncope. Patient found to have increased confusion on admission. Patient A&Ox4 on exam and has remained so far  several days. Patient sustained an unwitnessed fall at home. Patient had loss of consciousness but denied hitting head. CT head neg for acute intracranial pathology. CT cervical spine and Xray R foot neg for fractures.  -Delirium precautions -Continue rifaximin and lactulose for hepatic encephalopathy prophylaxis -Tylenol 650 mg q6h prn for pain -Fall precautions -PT/OT did not recommend pt f/u or snf  placement -family (sister:Robin) who is legal guardian can not take care of pt at home and asking to send her to long term facility. She mentions that pt needs to be supervised for taking her meds and also make sure she does not drink. Patient notified. Will continue coordinating with sister, CM, and [facility]. -Appreciate care manager f/u  #Laryngeal cancer s/p tracheostomy Reportedly currently in remission.  Lurline Idol has been removed, tracheostomy site remains open, no trach care needed other than dressing over site. Lungs sounds were harsh today, suggesting phlegm remaining in trachea and bronchial tree.  - secretions better since suctioning, continue prn - continue trach care  #Hx of alcohol use Ethanol level was normal on admission.  -CIWA w/o ativan   LOS: 5 days   Andrew Au, MD 10/08/2019, 9:59 AM

## 2019-10-09 ENCOUNTER — Telehealth: Payer: Self-pay | Admitting: Gastroenterology

## 2019-10-09 ENCOUNTER — Other Ambulatory Visit: Payer: Self-pay | Admitting: Gastroenterology

## 2019-10-09 ENCOUNTER — Telehealth: Payer: Self-pay

## 2019-10-09 DIAGNOSIS — I8500 Esophageal varices without bleeding: Secondary | ICD-10-CM

## 2019-10-09 NOTE — Telephone Encounter (Signed)
Spoke with Ines Bloomer who is patients Emergency planning/management officer. A hospital  follow up appointment has been scheduled for 11/13/19. She will call the office to schedule EGD at [facility]  once she discuses's availability with her sister Shirlean Mylar.

## 2019-10-09 NOTE — Telephone Encounter (Signed)
Spoke to patients daughter [name]. EGD/Banding scheduled at [facility] on 11/22/19 at 12 pm. Instructions reviewed via MyChart and mailed. She will call with any questions.

## 2019-10-18 ENCOUNTER — Other Ambulatory Visit: Payer: Self-pay | Admitting: *Deleted

## 2019-10-18 NOTE — Patient Outreach (Signed)
Care Coordination  10/18/2019  Judy Meza 1960/04/22 [id number]  [name] was referred to the [facility] Managed Care High Risk team for assistance with care coordination and care management services. Care coordination/care management services as part of the Medicaid benefit was offered to the patient's legal guardian, Charisse Klinefelter today. She declined assistance offered today. Patient's legal guardian reports [facility] coordinates all of [name]'s care and needs at this time. RNCM contact information provided if any care coordination needs arise.  Plan: The Medicaid Managed Care High Risk team is available at any time in the future to assist with care coordination/care management services upon referral.   Lurena Joiner RN, BSN [place] RN Care Coordinator

## 2019-10-18 NOTE — Patient Instructions (Signed)
Thank you for taking time to speak with me today about care coordination and care management services available to you at no cost as part of your Medicaid benefit. These services are voluntary. Our team is available to provide assistance regarding your health care needs at any time. Please do not hesitate to reach out to me if we can be of service to you at any time in the future. [phone]  Davian Hanshaw RN, BSN [place]  [facility] RN Care Coordinator    

## 2019-10-22 ENCOUNTER — Inpatient Hospital Stay (HOSPITAL_COMMUNITY)
Admission: EM | Admit: 2019-10-22 | Discharge: 2019-10-28 | DRG: 432 | Disposition: A | Discharge status: Home Health | Payer: Medicaid Other | Attending: Internal Medicine | Admitting: Internal Medicine

## 2019-10-22 ENCOUNTER — Other Ambulatory Visit: Payer: Self-pay

## 2019-10-22 ENCOUNTER — Encounter (HOSPITAL_COMMUNITY): Payer: Self-pay

## 2019-10-22 ENCOUNTER — Emergency Department (HOSPITAL_COMMUNITY): Payer: Medicaid Other

## 2019-10-22 DIAGNOSIS — Z79899 Other long term (current) drug therapy: Secondary | ICD-10-CM

## 2019-10-22 DIAGNOSIS — F0390 Unspecified dementia without behavioral disturbance: Secondary | ICD-10-CM | POA: Diagnosis present

## 2019-10-22 DIAGNOSIS — Z66 Do not resuscitate: Secondary | ICD-10-CM | POA: Diagnosis present

## 2019-10-22 DIAGNOSIS — R1310 Dysphagia, unspecified: Secondary | ICD-10-CM | POA: Diagnosis present

## 2019-10-22 DIAGNOSIS — R0602 Shortness of breath: Secondary | ICD-10-CM

## 2019-10-22 DIAGNOSIS — B182 Chronic viral hepatitis C: Secondary | ICD-10-CM | POA: Diagnosis present

## 2019-10-22 DIAGNOSIS — D6959 Other secondary thrombocytopenia: Secondary | ICD-10-CM | POA: Diagnosis present

## 2019-10-22 DIAGNOSIS — Z720 Tobacco use: Secondary | ICD-10-CM | POA: Diagnosis not present

## 2019-10-22 DIAGNOSIS — I129 Hypertensive chronic kidney disease with stage 1 through stage 4 chronic kidney disease, or unspecified chronic kidney disease: Secondary | ICD-10-CM | POA: Diagnosis present

## 2019-10-22 DIAGNOSIS — D696 Thrombocytopenia, unspecified: Secondary | ICD-10-CM | POA: Diagnosis not present

## 2019-10-22 DIAGNOSIS — N179 Acute kidney failure, unspecified: Secondary | ICD-10-CM | POA: Diagnosis present

## 2019-10-22 DIAGNOSIS — K7040 Alcoholic hepatic failure without coma: Secondary | ICD-10-CM | POA: Diagnosis not present

## 2019-10-22 DIAGNOSIS — Z9119 Patient's noncompliance with other medical treatment and regimen: Secondary | ICD-10-CM | POA: Diagnosis not present

## 2019-10-22 DIAGNOSIS — Z87891 Personal history of nicotine dependence: Secondary | ICD-10-CM

## 2019-10-22 DIAGNOSIS — D638 Anemia in other chronic diseases classified elsewhere: Secondary | ICD-10-CM | POA: Diagnosis present

## 2019-10-22 DIAGNOSIS — D649 Anemia, unspecified: Secondary | ICD-10-CM | POA: Diagnosis not present

## 2019-10-22 DIAGNOSIS — K767 Hepatorenal syndrome: Secondary | ICD-10-CM | POA: Diagnosis present

## 2019-10-22 DIAGNOSIS — J449 Chronic obstructive pulmonary disease, unspecified: Secondary | ICD-10-CM | POA: Diagnosis present

## 2019-10-22 DIAGNOSIS — Z9114 Patient's other noncompliance with medication regimen: Secondary | ICD-10-CM

## 2019-10-22 DIAGNOSIS — F1010 Alcohol abuse, uncomplicated: Secondary | ICD-10-CM | POA: Diagnosis present

## 2019-10-22 DIAGNOSIS — B370 Candidal stomatitis: Secondary | ICD-10-CM | POA: Diagnosis present

## 2019-10-22 DIAGNOSIS — Z8521 Personal history of malignant neoplasm of larynx: Secondary | ICD-10-CM | POA: Diagnosis not present

## 2019-10-22 DIAGNOSIS — K7200 Acute and subacute hepatic failure without coma: Secondary | ICD-10-CM | POA: Diagnosis present

## 2019-10-22 DIAGNOSIS — Z930 Tracheostomy status: Secondary | ICD-10-CM

## 2019-10-22 DIAGNOSIS — Z515 Encounter for palliative care: Secondary | ICD-10-CM

## 2019-10-22 DIAGNOSIS — K7290 Hepatic failure, unspecified without coma: Principal | ICD-10-CM

## 2019-10-22 DIAGNOSIS — R296 Repeated falls: Secondary | ICD-10-CM | POA: Diagnosis present

## 2019-10-22 DIAGNOSIS — Z85819 Personal history of malignant neoplasm of unspecified site of lip, oral cavity, and pharynx: Secondary | ICD-10-CM

## 2019-10-22 DIAGNOSIS — Z8249 Family history of ischemic heart disease and other diseases of the circulatory system: Secondary | ICD-10-CM | POA: Diagnosis not present

## 2019-10-22 DIAGNOSIS — Z430 Encounter for attention to tracheostomy: Secondary | ICD-10-CM | POA: Diagnosis not present

## 2019-10-22 DIAGNOSIS — K7682 Hepatic encephalopathy: Secondary | ICD-10-CM

## 2019-10-22 DIAGNOSIS — K7031 Alcoholic cirrhosis of liver with ascites: Secondary | ICD-10-CM | POA: Diagnosis present

## 2019-10-22 DIAGNOSIS — N1832 Chronic kidney disease, stage 3b: Secondary | ICD-10-CM | POA: Diagnosis present

## 2019-10-22 DIAGNOSIS — R627 Adult failure to thrive: Secondary | ICD-10-CM | POA: Diagnosis not present

## 2019-10-22 DIAGNOSIS — Z20822 Contact with and (suspected) exposure to covid-19: Secondary | ICD-10-CM | POA: Diagnosis present

## 2019-10-22 LAB — CBC
HCT: 32.9 % — ABNORMAL LOW (ref 36.0–46.0)
Hemoglobin: 10.7 g/dL — ABNORMAL LOW (ref 12.0–15.0)
MCH: 30.4 pg (ref 26.0–34.0)
MCHC: 32.5 g/dL (ref 30.0–36.0)
MCV: 93.5 fL (ref 80.0–100.0)
Platelets: 100 10*3/uL — ABNORMAL LOW (ref 150–400)
RBC: 3.52 MIL/uL — ABNORMAL LOW (ref 3.87–5.11)
RDW: 17.7 % — ABNORMAL HIGH (ref 11.5–15.5)
WBC: 4.7 10*3/uL (ref 4.0–10.5)
nRBC: 0 % (ref 0.0–0.2)

## 2019-10-22 LAB — COMPREHENSIVE METABOLIC PANEL
ALT: 23 U/L (ref 0–44)
AST: 52 U/L — ABNORMAL HIGH (ref 15–41)
Albumin: 3.7 g/dL (ref 3.5–5.0)
Alkaline Phosphatase: 81 U/L (ref 38–126)
Anion gap: 14 (ref 5–15)
BUN: 44 mg/dL — ABNORMAL HIGH (ref 6–20)
CO2: 22 mmol/L (ref 22–32)
Calcium: 9.7 mg/dL (ref 8.9–10.3)
Chloride: 100 mmol/L (ref 98–111)
Creatinine, Ser: 3.22 mg/dL — ABNORMAL HIGH (ref 0.44–1.00)
GFR, Estimated: 15 mL/min — ABNORMAL LOW (ref >60)
Glucose, Bld: 91 mg/dL (ref 70–99)
Potassium: 4.3 mmol/L (ref 3.5–5.1)
Sodium: 136 mmol/L (ref 135–145)
Total Bilirubin: 2.3 mg/dL — ABNORMAL HIGH (ref 0.3–1.2)
Total Protein: 8.7 g/dL — ABNORMAL HIGH (ref 6.5–8.1)

## 2019-10-22 LAB — URINALYSIS, ROUTINE W REFLEX MICROSCOPIC
Bilirubin Urine: NEGATIVE
Glucose, UA: NEGATIVE mg/dL
Hgb urine dipstick: NEGATIVE
Ketones, ur: NEGATIVE mg/dL
Leukocytes,Ua: NEGATIVE
Nitrite: NEGATIVE
Protein, ur: NEGATIVE mg/dL
Specific Gravity, Urine: 1.006 (ref 1.005–1.030)
pH: 7 (ref 5.0–8.0)

## 2019-10-22 LAB — DIFFERENTIAL
Abs Immature Granulocytes: 0.01 10*3/uL (ref 0.00–0.07)
Basophils Absolute: 0 10*3/uL (ref 0.0–0.1)
Basophils Relative: 0 %
Eosinophils Absolute: 0.1 10*3/uL (ref 0.0–0.5)
Eosinophils Relative: 1 %
Immature Granulocytes: 0 %
Lymphocytes Relative: 25 %
Lymphs Abs: 1.2 10*3/uL (ref 0.7–4.0)
Monocytes Absolute: 0.5 10*3/uL (ref 0.1–1.0)
Monocytes Relative: 11 %
Neutro Abs: 2.9 10*3/uL (ref 1.7–7.7)
Neutrophils Relative %: 63 %

## 2019-10-22 LAB — BLOOD GAS, VENOUS
Acid-Base Excess: 1.2 mmol/L (ref 0.0–2.0)
Bicarbonate: 25.9 mmol/L (ref 20.0–28.0)
O2 Saturation: 33 %
Patient temperature: 98.6
pCO2, Ven: 44.1 mmHg (ref 44.0–60.0)
pH, Ven: 7.387 (ref 7.250–7.430)
pO2, Ven: <31 mmHg — CL (ref 32.0–45.0)

## 2019-10-22 LAB — RAPID URINE DRUG SCREEN, HOSP PERFORMED
Amphetamines: NOT DETECTED
Barbiturates: NOT DETECTED
Benzodiazepines: POSITIVE — AB
Cocaine: NOT DETECTED
Opiates: NOT DETECTED
Tetrahydrocannabinol: NOT DETECTED

## 2019-10-22 LAB — AMMONIA: Ammonia: 71 umol/L — ABNORMAL HIGH (ref 9–35)

## 2019-10-22 LAB — PROTIME-INR
INR: 1.3 — ABNORMAL HIGH (ref 0.8–1.2)
Prothrombin Time: 15.3 seconds — ABNORMAL HIGH (ref 11.4–15.2)

## 2019-10-22 LAB — APTT: aPTT: 38 seconds — ABNORMAL HIGH (ref 24–36)

## 2019-10-22 LAB — RESPIRATORY PANEL BY RT PCR (FLU A&B, COVID)
Influenza A by PCR: NEGATIVE
Influenza B by PCR: NEGATIVE
SARS Coronavirus 2 by RT PCR: NEGATIVE

## 2019-10-22 LAB — ETHANOL: Alcohol, Ethyl (B): <10 mg/dL (ref <10)

## 2019-10-22 MED ORDER — SOFOSBUVIR-VELPATASVIR 400-100 MG PO TABS: 1.0000 | ORAL_TABLET | Freq: Every day | ORAL | Status: DC

## 2019-10-22 MED ORDER — DEXTROSE IN LACTATED RINGERS 5 % IV SOLN: INTRAVENOUS | Status: DC

## 2019-10-22 MED ORDER — VITAMIN D 25 MCG (1000 UNIT) PO TABS: 5000.0000 [IU] | ORAL_TABLET | ORAL | Status: DC

## 2019-10-22 MED ORDER — SODIUM CHLORIDE 0.9 % IV BOLUS
500.0000 mL | Freq: Once | INTRAVENOUS | Status: AC
  Administered 2019-10-22: 500 mL via INTRAVENOUS

## 2019-10-22 MED ORDER — SODIUM CHLORIDE 0.9 % IV BOLUS
1000.0000 mL | Freq: Once | INTRAVENOUS | Status: AC
  Administered 2019-10-22: 1000 mL via INTRAVENOUS

## 2019-10-22 MED ORDER — LORATADINE 10 MG PO TABS: 10.0000 mg | ORAL_TABLET | Freq: Every day | ORAL | Status: DC

## 2019-10-22 MED ORDER — HYDROXYZINE HCL 25 MG PO TABS: 25.0000 mg | ORAL_TABLET | Freq: Four times a day (QID) | ORAL | Status: DC | PRN

## 2019-10-22 MED ORDER — NADOLOL 20 MG PO TABS
40.0000 mg | ORAL_TABLET | Freq: Every day | ORAL | Status: DC
  Filled 2019-10-22: qty 1

## 2019-10-22 MED ORDER — ALBUTEROL SULFATE (2.5 MG/3ML) 0.083% IN NEBU: 2.5000 mg | INHALATION_SOLUTION | Freq: Four times a day (QID) | RESPIRATORY_TRACT | Status: DC | PRN

## 2019-10-22 MED ORDER — LACTULOSE 10 GM/15ML PO SOLN: 20.0000 g | Freq: Three times a day (TID) | ORAL | Status: DC

## 2019-10-22 MED ORDER — SPIRONOLACTONE 25 MG PO TABS
50.0000 mg | ORAL_TABLET | Freq: Every day | ORAL | Status: DC
  Filled 2019-10-22: qty 2

## 2019-10-22 MED ORDER — RIFAXIMIN 550 MG PO TABS
550.0000 mg | ORAL_TABLET | Freq: Two times a day (BID) | ORAL | Status: DC
  Filled 2019-10-22 (×2): qty 1

## 2019-10-22 MED ORDER — LORAZEPAM 0.5 MG PO TABS: 0.5000 mg | ORAL_TABLET | Freq: Every evening | ORAL | Status: DC | PRN

## 2019-10-22 MED ORDER — FOLIC ACID 1 MG PO TABS: 1.0000 mg | ORAL_TABLET | Freq: Every day | ORAL | Status: DC

## 2019-10-22 MED ORDER — NICOTINE 21 MG/24HR TD PT24: 21.0000 mg | MEDICATED_PATCH | Freq: Every day | TRANSDERMAL | Status: DC

## 2019-10-22 MED ORDER — PAROXETINE HCL 20 MG PO TABS: 40.0000 mg | ORAL_TABLET | ORAL | Status: DC

## 2019-10-22 MED ORDER — MEMANTINE HCL 5 MG PO TABS: 5.0000 mg | ORAL_TABLET | Freq: Two times a day (BID) | ORAL | Status: DC

## 2019-10-22 MED ORDER — SODIUM BICARBONATE 650 MG PO TABS
650.0000 mg | ORAL_TABLET | Freq: Two times a day (BID) | ORAL | Status: DC
  Filled 2019-10-22 (×2): qty 1

## 2019-10-22 MED ORDER — ENSURE ENLIVE PO LIQD
237.0000 mL | Freq: Two times a day (BID) | ORAL | Status: DC
  Filled 2019-10-22: qty 237

## 2019-10-22 MED ORDER — FAMOTIDINE 20 MG PO TABS: 20.0000 mg | ORAL_TABLET | Freq: Every day | ORAL | Status: DC

## 2019-10-22 MED ORDER — GABAPENTIN 100 MG PO CAPS: 200.0000 mg | ORAL_CAPSULE | Freq: Three times a day (TID) | ORAL | Status: DC

## 2019-10-22 MED ORDER — HYDROCERIN EX CREA
1.0000 "application " | TOPICAL_CREAM | CUTANEOUS | Status: DC | PRN
  Administered 2019-10-25: 1 via TOPICAL
  Filled 2019-10-22: qty 113

## 2019-10-22 MED ORDER — SODIUM CHLORIDE 0.9 % IV SOLN
100.0000 mL/h | INTRAVENOUS | Status: DC
  Administered 2019-10-22: 100 mL/h via INTRAVENOUS

## 2019-10-22 MED ORDER — POLYVINYL ALCOHOL 1.4 % OP SOLN: 1.0000 [drp] | Freq: Two times a day (BID) | OPHTHALMIC | Status: DC | PRN

## 2019-10-22 MED ORDER — FUROSEMIDE 40 MG PO TABS: 40.0000 mg | ORAL_TABLET | Freq: Every day | ORAL | Status: DC

## 2019-10-22 MED ORDER — LACTULOSE ENEMA
300.0000 mL | Freq: Once | ORAL | Status: AC
  Administered 2019-10-23: 300 mL via RECTAL
  Filled 2019-10-22: qty 300

## 2019-10-22 MED ORDER — OLOPATADINE HCL 0.1 % OP SOLN
1.0000 [drp] | Freq: Two times a day (BID) | OPHTHALMIC | Status: DC
  Administered 2019-10-23 – 2019-10-28 (×10): 1 [drp] via OPHTHALMIC
  Filled 2019-10-22 (×2): qty 5

## 2019-10-22 MED ORDER — ALBUTEROL SULFATE HFA 108 (90 BASE) MCG/ACT IN AERS: 2.0000 | INHALATION_SPRAY | Freq: Four times a day (QID) | RESPIRATORY_TRACT | Status: DC | PRN

## 2019-10-22 NOTE — ED Provider Notes (Signed)
[place] [facility] DEPT Provider Note   CSN: [id number] Arrival date & time: 10/22/19  1933     History Chief Complaint  Patient presents with  . Altered Mental Status    Judy Meza is a 59 y.o. female.  HPI   Patient presents to the ED for evaluation of altered mental status.  Patient has a history of alcohol use disorder, cirrhosis dementia and other comorbidities.  Patient was last admitted to the hospital the end of September for ascites and cirrhosis.  Patient was brought to the ED today because of abnormal behavior according to family members.  According to the EMS report sister noted the patient was becoming more confused and agitated.  When EMS arrived she did not want to come to the hospital.  They gave her 5 mg of Versed in order to get her to comply.  In the ED the patient is somnolent and I cannot get her to answer any my questions.  Past Medical History:  Diagnosis Date  . Cancer ([place])   . COPD (chronic obstructive pulmonary disease) ([place])   . 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 Hypertension   . Liver disease   . Renal disorder     Patient Active Problem List   Diagnosis Date Noted  . Secondary esophageal varices with bleeding ([place])   . Esophageal stricture   . Cirrhosis ([place])   . Decompensated hepatic cirrhosis ([place]) 10/03/2019  . Acute on chronic anemia 10/03/2019  . Cancer screening 08/14/2019  . Back pain with radiculopathy 08/07/2019  . Recurrent falls 08/04/2019  . Multiple falls at home 08/03/2019  . 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 Goals of care, counseling/discussion 07/28/2019  . Acute encephalopathy 06/05/2019  . Chest pain 06/04/2019  . QT prolongation 06/04/2019  . Metabolic acidosis 07/12/1599  . Dementia without behavioral disturbance ([place]) 05/10/2019  . Chronic hepatitis C without hepatic coma ([place]) 05/10/2019  . Hepatic encephalopathy ([place]) 05/09/2019  . Pancytopenia, acquired ([place])   .  Cirrhosis of liver ([place])   . CKD (chronic kidney disease), stage III ([place]) 04/29/2019  . COPD with chronic bronchitis ([place]) 04/29/2019  . Tobacco abuse 04/29/2019  . Alcoholism in remission ([place]) 04/29/2019  . Acute hepatic encephalopathy 04/29/2019  . AKI (acute kidney injury) ([place]) 04/29/2019  . Alcoholic cirrhosis of liver with ascites ([place]) 04/29/2019  . Thrombocytopenia ([place]) 04/29/2019  . Hyperkalemia 04/29/2019    Past Surgical History:  Procedure Laterality Date  . ESOPHAGEAL BANDING  10/04/2019   Procedure: ESOPHAGEAL BANDING;  Surgeon: Lavena Bullion, DO;  Location: [facility];  Service: Gastroenterology;;  . ESOPHAGOGASTRODUODENOSCOPY (EGD) WITH PROPOFOL N/A 10/04/2019   Procedure: ESOPHAGOGASTRODUODENOSCOPY (EGD) WITH PROPOFOL;  Surgeon: Lavena Bullion, DO;  Location: [facility];  Service: Gastroenterology;  Laterality: N/A;  . HOT HEMOSTASIS N/A 10/04/2019   Procedure: HOT HEMOSTASIS (ARGON PLASMA COAGULATION/BICAP);  Surgeon: Lavena Bullion, DO;  Location: [facility] ENDOSCOPY;  Service: Gastroenterology;  Laterality: N/A;  . PORT-A-CATH REMOVAL       OB History    Gravida  2   Para      Term      Preterm      AB      Living  1     SAB      TAB      Ectopic      Multiple      Live Births  Family History  Problem Relation Age of Onset  . Healthy Mother   . Hypertension Other     Social History   Tobacco Use  . Smoking status: Former Smoker    Packs/day: 0.25    Types: Cigarettes    Quit date: 04/03/2019    Years since quitting: 0.5  . Smokeless tobacco: Never Used  Vaping Use  . Vaping Use: Never used  Substance Use Topics  . Alcohol use: Not Currently  . Drug use: Not Currently    Home Medications Prior to Admission medications   Medication Sig Start Date End Date Taking? Authorizing Provider  albuterol (PROVENTIL) (2.5 MG/3ML) 0.083% nebulizer solution Take 3 mLs (2.5 mg total) by nebulization every 6 (six)  hours as needed for wheezing or shortness of breath. 06/01/19  Yes Vevelyn Francois, NP  albuterol (VENTOLIN HFA) 108 (90 Base) MCG/ACT inhaler Inhale 2 puffs into the lungs every 6 (six) hours as needed for wheezing or shortness of breath. 06/01/19 10/22/19 Yes King, Diona Foley, NP  cholestyramine Lucrezia Starch) 4 g packet SMARTSIG:1 Packet(s) By Mouth Every Morning 09/28/19   [provider]  famotidine (PEPCID) 20 MG tablet Take 20 mg by mouth daily.    [provider]  feeding supplement, ENSURE ENLIVE, (ENSURE ENLIVE) LIQD Take 237 mLs by mouth 2 (two) times daily between meals. 05/02/19   Eugenie Filler, MD  folic acid (FOLVITE) 1 MG tablet Take 1 tablet (1 mg total) by mouth daily. 10/09/19   Andrew Au, MD  furosemide (LASIX) 20 MG tablet Take 40 mg by mouth daily.     [provider]  gabapentin (NEURONTIN) 100 MG capsule Take 200 mg by mouth 3 (three) times daily.    [provider]  hydrOXYzine (ATARAX/VISTARIL) 10 MG tablet Take 1 tablet (10 mg total) by mouth 3 (three) times daily as needed for itching or anxiety. Patient not taking: Reported on 10/04/2019 06/01/19 11/19/21  Vevelyn Francois, NP  hydrOXYzine (ATARAX/VISTARIL) 25 MG tablet Take 25 mg by mouth every 6 (six) hours as needed for anxiety or itching.    [provider]  lactulose (CHRONULAC) 10 GM/15ML solution Take 30 mLs (20 g total) by mouth 3 (three) times daily. 06/01/19   Vevelyn Francois, NP  loratadine (CLARITIN) 10 MG tablet Take 1 tablet (10 mg total) by mouth daily. 06/01/19 10/04/19  Vevelyn Francois, NP  LORazepam (ATIVAN) 0.5 MG tablet Take 0.5 mg by mouth at bedtime as needed for anxiety.     [provider]  memantine (NAMENDA) 5 MG tablet Take 1 tablet (5 mg total) by mouth 2 (two) times daily. Patient taking differently: Take 5 mg by mouth daily.  06/01/19 10/04/19  Vevelyn Francois, NP  nadolol (CORGARD) 40 MG tablet Take 1 tablet (40 mg total) by mouth daily. 10/09/19    Andrew Au, MD  nystatin (MYCOSTATIN) 100000 UNIT/ML suspension Take 4 mLs by mouth in the morning, at noon, in the evening, and at bedtime. X 14 days 09/26/19   [provider]  Olopatadine HCl (PATADAY) 0.2 % SOLN Place 1 drop into both eyes daily as needed (allergic conjunctivitis).    [provider]  pantoprazole (PROTONIX) 40 MG tablet Take 40 mg by mouth daily. Patient not taking: Reported on 10/04/2019    [provider]  PARoxetine (PAXIL) 40 MG tablet Take 1 tablet (40 mg total) by mouth every morning. 06/01/19 10/04/19  Vevelyn Francois, NP  rifaximin Doreene Nest)  550 MG TABS tablet Take 1 tablet (550 mg total) by mouth 2 (two) times daily. Patient taking differently: Take 550 mg by mouth daily.  06/09/19   Vevelyn Francois, NP  Skin Protectants, Misc. (EUCERIN) cream Apply 1 application topically as needed for dry skin (and itching).    [provider]  sodium bicarbonate 650 MG tablet Take 1 tablet (650 mg total) by mouth 2 (two) times daily. 08/09/19   Hosie Poisson, MD  Sofosbuvir-Velpatasvir (EPCLUSA) 400-100 MG TABS Take 1 tablet by mouth daily. 07/12/19   Kuppelweiser, Cassie L, RPH-CPP  spironolactone (ALDACTONE) 100 MG tablet Take 0.5 tablets (50 mg total) by mouth daily. 05/11/19   Desiree Hane, MD  SYSTANE ULTRA 0.4-0.3 % SOLN SMARTSIG:1 Drop(s) In Eye(s) PRN 09/28/19   [provider]  thiamine 100 MG tablet Take 1 tablet (100 mg total) by mouth daily. 06/08/19   Mendel Corning, MD    Allergies    Patient has no known allergies.  Review of Systems   Review of Systems  Unable to perform ROS: Dementia    Physical Exam Updated Vital Signs BP 130/82 (BP Location: Left Arm)   Pulse 72   Temp 97.7 F (36.5 C) (Oral)   Resp 12   SpO2 93%   Physical Exam Vitals and nursing note reviewed.  Constitutional:      Appearance: She is well-developed. She is ill-appearing.  HENT:     Head: Normocephalic and atraumatic.     Right  Ear: External ear normal.     Left Ear: External ear normal.  Eyes:     General: No scleral icterus.       Right eye: No discharge.        Left eye: No discharge.     Conjunctiva/sclera: Conjunctivae normal.  Neck:     Trachea: No tracheal deviation.     Comments: Open stoma, no drainage Cardiovascular:     Rate and Rhythm: Normal rate and regular rhythm.  Pulmonary:     Effort: Pulmonary effort is normal. No respiratory distress.     Breath sounds: Normal breath sounds. No stridor. No wheezing or rales.  Abdominal:     General: Bowel sounds are normal. There is no distension.     Palpations: Abdomen is soft.     Tenderness: There is no abdominal tenderness. There is no guarding or rebound.  Musculoskeletal:        General: No tenderness.     Cervical back: Neck supple.  Skin:    General: Skin is warm and dry.     Findings: No rash.  Neurological:     Mental Status: She is unresponsive.     Motor: No abnormal muscle tone or seizure activity.     Coordination: Coordination normal.     Comments: Patient responds minimally to painful stimuli, she is somnolent not following any commands, note the patient was given Versed prior to arrival in the ED     ED Results / Procedures / Treatments   Labs (all labs ordered are listed, but only abnormal results are displayed) Labs Reviewed  PROTIME-INR - Abnormal; Notable for the following components:      Result Value   Prothrombin Time 15.3 (*)    INR 1.3 (*)    All other components within normal limits  APTT - Abnormal; Notable for the following components:   aPTT 38 (*)    All other components within normal limits  CBC - Abnormal; Notable for  the following components:   RBC 3.52 (*)    Hemoglobin 10.7 (*)    HCT 32.9 (*)    RDW 17.7 (*)    Platelets 100 (*)    All other components within normal limits  COMPREHENSIVE METABOLIC PANEL - Abnormal; Notable for the following components:   BUN 44 (*)    Creatinine, Ser 3.22 (*)     Total Protein 8.7 (*)    AST 52 (*)    Total Bilirubin 2.3 (*)    GFR, Estimated 15 (*)    All other components within normal limits  RAPID URINE DRUG SCREEN, HOSP PERFORMED - Abnormal; Notable for the following components:   Benzodiazepines POSITIVE (*)    All other components within normal limits  BLOOD GAS, VENOUS - Abnormal; Notable for the following components:   pO2, Ven <31.0 (*)    All other components within normal limits  AMMONIA - Abnormal; Notable for the following components:   Ammonia 71 (*)    All other components within normal limits  RESPIRATORY PANEL BY RT PCR (FLU A&B, COVID)  ETHANOL  DIFFERENTIAL  URINALYSIS, ROUTINE W REFLEX MICROSCOPIC    EKG EKG Interpretation  Date/Time:  Sunday October 22 2019 19:44:57 EDT Ventricular Rate:  68 PR Interval:    QRS Duration: 85 QT Interval:  457 QTC Calculation: 487 R Axis:   59 Text Interpretation: Sinus rhythm Borderline prolonged QT interval No significant change since last tracing Confirmed by Dorie Rank [phone]) on 10/22/2019 8:31:38 PM   Radiology CT HEAD WO CONTRAST  Result Date: 10/22/2019 CLINICAL DATA:  Increasing confusion, combative EXAM: CT HEAD WITHOUT CONTRAST TECHNIQUE: Contiguous axial images were obtained from the base of the skull through the vertex without intravenous contrast. COMPARISON:  CT 08/03/2019 FINDINGS: Brain: No evidence of acute infarction, hemorrhage, hydrocephalus, extra-axial collection, visible mass lesion or mass effect. Symmetric prominence of the ventricles, cisterns and sulci compatible with parenchymal volume loss. Patchy areas of white matter hypoattenuation are most compatible with chronic microvascular angiopathy. Vascular: Atherosclerotic calcification of the carotid siphons. No hyperdense vessel. Skull: No calvarial fracture or suspicious osseous lesion. No scalp swelling or hematoma. Sinuses/Orbits: Paranasal sinuses and mastoid air cells are predominantly clear. Included  orbital structures are unremarkable. Other: None IMPRESSION: 1. No acute intracranial findings. 2. Mild, age advanced chronic microvascular angiopathy and parenchymal volume loss. Electronically Signed   By: [name] M.D.   On: 10/22/2019 20:55    Procedures Procedures (including critical care time)  Medications Ordered in ED Medications  sodium chloride 0.9 % bolus 500 mL (0 mLs Intravenous Stopped 10/22/19 2158)    Followed by  0.9 %  sodium chloride infusion (100 mL/hr Intravenous New Bag/Given 10/22/19 2052)  sodium chloride 0.9 % bolus 1,000 mL (1,000 mLs Intravenous New Bag/Given 10/22/19 2158)    ED Course  I have reviewed the triage vital signs and the nursing notes.  Pertinent labs & imaging results that were available during my care of the patient were reviewed by me and considered in my medical decision making (see chart for details).  Clinical Course as of Oct 21 2201  Sun Oct 22, 2019  2151 Patient's urinalysis is normal.   [JK]  2152 Venous blood gas notable for low O2 but I do not think this is significant   [JK]  2152 CBC normal.   [JK]  2152 Creatinine elevated compared to previous   [JK]  2153 No acute findings on head CT   [JK]  Clinical Course User Index [JK] Dorie Rank, MD   MDM Rules/Calculators/A&P                         Patient presents ED for evaluation of altered mental status.  Patient presented to ED very somnolent but she was given Versed prior to arrival.  Patient CT scan does not show evidence of acute abnormality.  No findings stroke or hemorrhage.  Laboratory tests show stable anemia but she does have an acute kidney injury.  Urinalysis and alcohol level are normal.  Patient's ammonia levels significantly elevated at 71.  I suspect that the primary etiology for her altered mental status.  Plan on lactulose once she is more alert when the Versed wears off.  Will admit to the hospital for further treatment.  Final Clinical Impression(s) /  ED Diagnoses Final diagnoses:  Hepatic encephalopathy ([facility])  AKI (acute kidney injury) ([place])      Dorie Rank, MD 10/22/19 2203

## 2019-10-22 NOTE — H&P (Signed)
History and Physical   Judy Meza OZH:[id number] DOB: 18-Jan-1960 DOA: 10/22/2019  Referring MD/NP/PA: Dr. Dorie Rank  PCP: Oris Drone, Mendel Corning, MD   Outpatient Specialists: None  Patient coming from: Home  Chief Complaint: Altered mental status  HPI: Judy Meza is a 59 y.o. female with medical history significant of alcoholic liver disease with cirrhosis, recurrent hepatosplenomegaly, chronic kidney disease stage III, tobacco abuse, pancytopenia who was brought in by EMS secondary to altered mental status.  Patient was agitated.  Seen at home.  Patient was more confused than usual.  Similar to what she has had in the past when she had hepatic encephalopathy.  Patient did not want to come to the ER at the time and was very agitated the EMS gave her 5 mg of Versed.  At the moment she is not able to communicate.  She she is therefore being admitted to the hospital with hepatic encephalopathy.  She has elevated ammonia consistent with her previous admission with hepatic encephalopathy.  History therefore obtained from EMS..  ED Course: Temperature 97.7 blood pressure 130/82 pulse 70 respirate of 17 oxygen sat 90% on room air.  Chemistry showed BUN of 44 creatinine 3.22 ammonia level is 71 and total bilirubin 2.3.  Previous creatinine and ammonia levels were much lower.  White count is 4.7 hemoglobin 10.7 and platelets 100.  Urinalysis negative for anything acute head CT without contrast is negative alcohol level less than 10.  Urine drug screen is positive for opiates.  Patient is being admitted for management hepatic encephalopathy.  Review of Systems: As per HPI otherwise 10 point review of systems negative.    Past Medical History:  Diagnosis Date  . Cancer ([place])   . COPD (chronic obstructive pulmonary disease) ([place])   . 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 Hypertension   . Liver disease   . Renal disorder     Past Surgical History:  Procedure Laterality Date  .  ESOPHAGEAL BANDING  10/04/2019   Procedure: ESOPHAGEAL BANDING;  Surgeon: Lavena Bullion, DO;  Location: [facility];  Service: Gastroenterology;;  . ESOPHAGOGASTRODUODENOSCOPY (EGD) WITH PROPOFOL N/A 10/04/2019   Procedure: ESOPHAGOGASTRODUODENOSCOPY (EGD) WITH PROPOFOL;  Surgeon: Lavena Bullion, DO;  Location: [facility];  Service: Gastroenterology;  Laterality: N/A;  . HOT HEMOSTASIS N/A 10/04/2019   Procedure: HOT HEMOSTASIS (ARGON PLASMA COAGULATION/BICAP);  Surgeon: Lavena Bullion, DO;  Location: [facility];  Service: Gastroenterology;  Laterality: N/A;  . PORT-A-CATH REMOVAL       reports that she quit smoking about 6 months ago. Her smoking use included cigarettes. She smoked 0.25 packs per day. She has never used smokeless tobacco. She reports previous alcohol use. She reports previous drug use.  No Known Allergies  Family History  Problem Relation Age of Onset  . Healthy Mother   . Hypertension Other      Prior to Admission medications   Medication Sig Start Date End Date Taking? Authorizing Provider  albuterol (PROVENTIL) (2.5 MG/3ML) 0.083% nebulizer solution Take 3 mLs (2.5 mg total) by nebulization every 6 (six) hours as needed for wheezing or shortness of breath. 06/01/19  Yes Vevelyn Francois, NP  albuterol (VENTOLIN HFA) 108 (90 Base) MCG/ACT inhaler Inhale 2 puffs into the lungs every 6 (six) hours as needed for wheezing or shortness of breath. 06/01/19 10/22/19 Yes Vevelyn Francois, NP  Cholecalciferol (VITAMIN D3) 125 MCG (5000 UT) CAPS Take 1 capsule by mouth once a week.   Yes  [provider]  famotidine (PEPCID) 20 MG tablet Take 20 mg by mouth daily.   Yes [provider]  feeding supplement, ENSURE ENLIVE, (ENSURE ENLIVE) LIQD Take 237 mLs by mouth 2 (two) times daily between meals. 05/02/19  Yes Eugenie Filler, MD  folic acid (FOLVITE) 1 MG tablet Take 1 tablet (1 mg total) by mouth daily. 10/09/19  Yes Andrew Au, MD  furosemide  (LASIX) 20 MG tablet Take 40 mg by mouth daily.    Yes [provider]  gabapentin (NEURONTIN) 100 MG capsule Take 200 mg by mouth 3 (three) times daily.   Yes [provider]  hydrOXYzine (ATARAX/VISTARIL) 25 MG tablet Take 25 mg by mouth every 6 (six) hours as needed for anxiety or itching.   Yes [provider]  lactulose (CHRONULAC) 10 GM/15ML solution Take 30 mLs (20 g total) by mouth 3 (three) times daily. 06/01/19  Yes Vevelyn Francois, NP  loratadine (CLARITIN) 10 MG tablet Take 1 tablet (10 mg total) by mouth daily. 06/01/19 10/22/19 Yes King, Diona Foley, NP  LORazepam (ATIVAN) 0.5 MG tablet Take 0.5 mg by mouth at bedtime as needed for anxiety.    Yes [provider]  memantine (NAMENDA) 5 MG tablet Take 1 tablet (5 mg total) by mouth 2 (two) times daily. 06/01/19 10/22/19 Yes King, Diona Foley, NP  nadolol (CORGARD) 40 MG tablet Take 1 tablet (40 mg total) by mouth daily. 10/09/19  Yes Andrew Au, MD  Olopatadine HCl (PATADAY) 0.2 % SOLN Place 1 drop into both eyes daily as needed (allergic conjunctivitis).   Yes [provider]  PARoxetine (PAXIL) 40 MG tablet Take 1 tablet (40 mg total) by mouth every morning. 06/01/19 10/22/19 Yes Vevelyn Francois, NP  rifaximin (XIFAXAN) 550 MG TABS tablet Take 1 tablet (550 mg total) by mouth 2 (two) times daily. 06/09/19  Yes Vevelyn Francois, NP  Skin Protectants, Misc. (EUCERIN) cream Apply 1 application topically as needed for dry skin (and itching).   Yes [provider]  sodium bicarbonate 650 MG tablet Take 1 tablet (650 mg total) by mouth 2 (two) times daily. 08/09/19  Yes Hosie Poisson, MD  Sofosbuvir-Velpatasvir (EPCLUSA) 400-100 MG TABS Take 1 tablet by mouth daily. 07/12/19  Yes Kuppelweiser, Cassie L, RPH-CPP  spironolactone (ALDACTONE) 100 MG tablet Take 0.5 tablets (50 mg total) by mouth daily. 05/11/19  Yes Oretha Milch D, MD  SYSTANE ULTRA 0.4-0.3 % SOLN Place 1 drop into both eyes 2 (two)  times daily as needed (dry eyes).  09/28/19  Yes [provider]  hydrOXYzine (ATARAX/VISTARIL) 10 MG tablet Take 1 tablet (10 mg total) by mouth 3 (three) times daily as needed for itching or anxiety. Patient not taking: Reported on 10/04/2019 06/01/19 11/19/21  Vevelyn Francois, NP  nystatin (MYCOSTATIN) 100000 UNIT/ML suspension Take 4 mLs by mouth in the morning, at noon, in the evening, and at bedtime. X 14 days Patient not taking: Reported on 10/22/2019 09/26/19   [provider]  thiamine 100 MG tablet Take 1 tablet (100 mg total) by mouth daily. Patient not taking: Reported on 10/22/2019 06/08/19   Mendel Corning, MD    Physical Exam: Vitals:   10/22/19 2000 10/22/19 2100 10/22/19 2130 10/22/19 2200  BP: 102/70 105/70 125/80 130/82  Pulse: 68 67 68 72  Resp: 12 13 11 12   Temp:      TempSrc:      SpO2: 99% 98% 96% 93%  Constitutional: Obtunded, not communicable Vitals:   10/22/19 2000 10/22/19 2100 10/22/19 2130 10/22/19 2200  BP: 102/70 105/70 125/80 130/82  Pulse: 68 67 68 72  Resp: 12 13 11 12   Temp:      TempSrc:      SpO2: 99% 98% 96% 93%   Eyes: PERRL, lids and conjunctivae normal ENMT: Mucous membranes are dry. Posterior pharynx clear of any exudate or lesions.Normal dentition.  Neck: normal, supple, no masses, no thyromegaly Respiratory: clear to auscultation bilaterally, no wheezing, no crackles. Normal respiratory effort. No accessory muscle use.  Cardiovascular: Regular rate and rhythm, no murmurs / rubs / gallops. No extremity edema. 2+ pedal pulses. No carotid bruits.  Abdomen: Distended, no tenderness, no masses palpated. No hepatosplenomegaly. Bowel sounds positive.  Musculoskeletal: no clubbing / cyanosis. No joint deformity upper and lower extremities. Good ROM, no contractures. Normal muscle tone.  Skin: no rashes, lesions, ulcers. No induration Neurologic: CN 2-12 grossly intact. Sensation intact, DTR normal. Strength 5/5 in all 4.    Psychiatric: Obtunded, unresponsive.    Labs on Admission: I have personally reviewed following labs and imaging studies  CBC: Recent Labs  Lab 10/22/19 2000  WBC 4.7  NEUTROABS 2.9  HGB 10.7*  HCT 32.9*  MCV 93.5  PLT 836*   Basic Metabolic Panel: Recent Labs  Lab 10/22/19 2000  NA 136  K 4.3  CL 100  CO2 22  GLUCOSE 91  BUN 44*  CREATININE 3.22*  CALCIUM 9.7   GFR: CrCl cannot be calculated (Unknown ideal weight.). Liver Function Tests: Recent Labs  Lab 10/22/19 2000  AST 52*  ALT 23  ALKPHOS 81  BILITOT 2.3*  PROT 8.7*  ALBUMIN 3.7   No results for input(s): LIPASE, AMYLASE in the last 168 hours. Recent Labs  Lab 10/22/19 2001  AMMONIA 71*   Coagulation Profile: Recent Labs  Lab 10/22/19 2000  INR 1.3*   Cardiac Enzymes: No results for input(s): CKTOTAL, CKMB, CKMBINDEX, TROPONINI in the last 168 hours. BNP (last 3 results) No results for input(s): PROBNP in the last 8760 hours. HbA1C: No results for input(s): HGBA1C in the last 72 hours. CBG: No results for input(s): GLUCAP in the last 168 hours. Lipid Profile: No results for input(s): CHOL, HDL, LDLCALC, TRIG, CHOLHDL, LDLDIRECT in the last 72 hours. Thyroid Function Tests: No results for input(s): TSH, T4TOTAL, FREET4, T3FREE, THYROIDAB in the last 72 hours. Anemia Panel: No results for input(s): VITAMINB12, FOLATE, FERRITIN, TIBC, IRON, RETICCTPCT in the last 72 hours. Urine analysis:    Component Value Date/Time   COLORURINE YELLOW 10/22/2019 2048   APPEARANCEUR CLEAR 10/22/2019 2048   LABSPEC 1.006 10/22/2019 2048   PHURINE 7.0 10/22/2019 2048   GLUCOSEU NEGATIVE 10/22/2019 2048   HGBUR NEGATIVE 10/22/2019 2048   BILIRUBINUR NEGATIVE 10/22/2019 2048   KETONESUR NEGATIVE 10/22/2019 2048   PROTEINUR NEGATIVE 10/22/2019 2048   NITRITE NEGATIVE 10/22/2019 2048   LEUKOCYTESUR NEGATIVE 10/22/2019 2048   Sepsis Labs: @LABRCNTIP (procalcitonin:4,lacticidven:4) )No results found  for this or any previous visit (from the past 240 hour(s)).   Radiological Exams on Admission: CT HEAD WO CONTRAST  Result Date: 10/22/2019 CLINICAL DATA:  Increasing confusion, combative EXAM: CT HEAD WITHOUT CONTRAST TECHNIQUE: Contiguous axial images were obtained from the base of the skull through the vertex without intravenous contrast. COMPARISON:  CT 08/03/2019 FINDINGS: Brain: No evidence of acute infarction, hemorrhage, hydrocephalus, extra-axial collection, visible mass lesion or mass effect. Symmetric prominence of the ventricles, cisterns and sulci compatible with parenchymal  volume loss. Patchy areas of white matter hypoattenuation are most compatible with chronic microvascular angiopathy. Vascular: Atherosclerotic calcification of the carotid siphons. No hyperdense vessel. Skull: No calvarial fracture or suspicious osseous lesion. No scalp swelling or hematoma. Sinuses/Orbits: Paranasal sinuses and mastoid air cells are predominantly clear. Included orbital structures are unremarkable. Other: None IMPRESSION: 1. No acute intracranial findings. 2. Mild, age advanced chronic microvascular angiopathy and parenchymal volume loss. Electronically Signed   By: [name] M.D.   On: 10/22/2019 20:55    EKG: Independently reviewed.  Sinus rhythm no significant changes  Assessment/Plan Active Problems:   Tobacco abuse   Acute hepatic encephalopathy   AKI (acute kidney injury) ([place])   Alcoholic cirrhosis of liver with ascites (HCC)   Thrombocytopenia (H[facility])   Dementia without behavioral disturbance (H[facility])   Acute on chronic anemia     #1 acute hepatic encephalopathy: Patient will be admitted to the hospital and initiated on lactulose.  She is she is currently drowsy unintended I will try lactulose retention enema.  Once patient is awake will resume oral lactulose and monitor.  Continue on telemetry.  #2 acute kidney injury on chronic kidney disease stage III: Aggressive hydration and  monitoring.  #3 thrombocytopenia: Secondary to liver disease.  Monitor.  Avoid anticoagulation.  #4 anemia: Chronic disease.  H&H stable at this point.  #5 tobacco abuse: Initiate nicotine patch.  #6 alcoholic cirrhosis: Continue chronic management.   DVT prophylaxis: SCD Code Status: Full code Family Communication: Sister over the phone Disposition Plan: To be determined Consults called: None Admission status: Inpatient  Severity of Illness: The appropriate patient status for this patient is INPATIENT. Inpatient status is judged to be reasonable and necessary in order to provide the required intensity of service to ensure the patient's safety. The patient's presenting symptoms, physical exam findings, and initial radiographic and laboratory data in the context of their chronic comorbidities is felt to place them at high risk for further clinical deterioration. Furthermore, it is not anticipated that the patient will be medically stable for discharge from the hospital within 2 midnights of admission. The following factors support the patient status of inpatient.   " The patient's presenting symptoms include confusion and altered mental status. " The worrisome physical exam findings include completely confused. " The initial radiographic and laboratory data are worrisome because of elevated ammonia level. " The chronic co-morbidities include hepatic encephalopathy.   * I certify that at the point of admission it is my clinical judgment that the patient will require inpatient hospital care spanning beyond 2 midnights from the point of admission due to high intensity of service, high risk for further deterioration and high frequency of surveillance required.Barbette Merino MD [facility] Pager [phone]  If 7PM-7AM, please contact night-coverage [email or web address] Password [email or web address]  10/22/2019, 10:22 PM

## 2019-10-22 NOTE — ED Notes (Signed)
Date and time results received: 10/22/19 8:55 PM  (use smartphrase ".now" to insert current time)  Test: pO2, Ven Critical Value: 26  Name of Provider Notified: Dr. Hillard Danker  Orders Received? Or Actions Taken?:

## 2019-10-22 NOTE — ED Triage Notes (Signed)
Patient arrived via [facility] due to increased confusion per sister (legal guardianship) Patient combative with ems, given 5mg  Versed.

## 2019-10-23 ENCOUNTER — Encounter (HOSPITAL_COMMUNITY): Payer: Self-pay | Admitting: Internal Medicine

## 2019-10-23 ENCOUNTER — Inpatient Hospital Stay (HOSPITAL_COMMUNITY): Payer: Medicaid Other

## 2019-10-23 ENCOUNTER — Ambulatory Visit: Payer: Medicaid Other | Admitting: Neurology

## 2019-10-23 ENCOUNTER — Other Ambulatory Visit: Payer: Self-pay

## 2019-10-23 DIAGNOSIS — K7290 Hepatic failure, unspecified without coma: Secondary | ICD-10-CM

## 2019-10-23 LAB — COMPREHENSIVE METABOLIC PANEL
ALT: 20 U/L (ref 0–44)
AST: 56 U/L — ABNORMAL HIGH (ref 15–41)
Albumin: 3.4 g/dL — ABNORMAL LOW (ref 3.5–5.0)
Alkaline Phosphatase: 75 U/L (ref 38–126)
Anion gap: 10 (ref 5–15)
BUN: 37 mg/dL — ABNORMAL HIGH (ref 6–20)
CO2: 22 mmol/L (ref 22–32)
Calcium: 8.9 mg/dL (ref 8.9–10.3)
Chloride: 104 mmol/L (ref 98–111)
Creatinine, Ser: 2.46 mg/dL — ABNORMAL HIGH (ref 0.44–1.00)
GFR, Estimated: 21 mL/min — ABNORMAL LOW (ref >60)
Glucose, Bld: 149 mg/dL — ABNORMAL HIGH (ref 70–99)
Potassium: 5 mmol/L (ref 3.5–5.1)
Sodium: 136 mmol/L (ref 135–145)
Total Bilirubin: 1.6 mg/dL — ABNORMAL HIGH (ref 0.3–1.2)
Total Protein: 8.1 g/dL (ref 6.5–8.1)

## 2019-10-23 LAB — CBC
HCT: 33.6 % — ABNORMAL LOW (ref 36.0–46.0)
Hemoglobin: 10.8 g/dL — ABNORMAL LOW (ref 12.0–15.0)
MCH: 30.2 pg (ref 26.0–34.0)
MCHC: 32.1 g/dL (ref 30.0–36.0)
MCV: 93.9 fL (ref 80.0–100.0)
Platelets: 89 10*3/uL — ABNORMAL LOW (ref 150–400)
RBC: 3.58 MIL/uL — ABNORMAL LOW (ref 3.87–5.11)
RDW: 17.8 % — ABNORMAL HIGH (ref 11.5–15.5)
WBC: 4.8 10*3/uL (ref 4.0–10.5)
nRBC: 0 % (ref 0.0–0.2)

## 2019-10-23 LAB — LIPASE, BLOOD: Lipase: 55 U/L — ABNORMAL HIGH (ref 11–51)

## 2019-10-23 MED ORDER — LORAZEPAM 2 MG/ML PO CONC
1.0000 mg | ORAL | Status: DC | PRN
  Filled 2019-10-23: qty 0.5

## 2019-10-23 MED ORDER — NYSTATIN 100000 UNIT/ML MT SUSP
5.0000 mL | Freq: Four times a day (QID) | OROMUCOSAL | Status: DC
  Administered 2019-10-23 – 2019-10-28 (×16): 500000 [IU] via ORAL
  Filled 2019-10-23 (×17): qty 5

## 2019-10-23 MED ORDER — FLUCONAZOLE IN SODIUM CHLORIDE 200-0.9 MG/100ML-% IV SOLN
200.0000 mg | Freq: Once | INTRAVENOUS | Status: AC
  Administered 2019-10-23: 200 mg via INTRAVENOUS
  Filled 2019-10-23: qty 100

## 2019-10-23 MED ORDER — LORAZEPAM 1 MG PO TABS: 1.0000 mg | ORAL_TABLET | ORAL | Status: DC | PRN

## 2019-10-23 MED ORDER — SODIUM CHLORIDE 0.9 % IV SOLN
2.0000 g | INTRAVENOUS | Status: DC
  Administered 2019-10-23: 2 g via INTRAVENOUS
  Filled 2019-10-23: qty 20

## 2019-10-23 MED ORDER — LORAZEPAM 2 MG/ML IJ SOLN: 1.0000 mg | INTRAMUSCULAR | Status: DC | PRN

## 2019-10-23 MED ORDER — FLUCONAZOLE 100MG IVPB: 100.0000 mg | INTRAVENOUS | Status: DC

## 2019-10-23 MED ORDER — SODIUM CHLORIDE 0.9% FLUSH: 3.0000 mL | INTRAVENOUS | Status: DC | PRN

## 2019-10-23 MED ORDER — GLYCOPYRROLATE 1 MG PO TABS
1.0000 mg | ORAL_TABLET | ORAL | Status: DC | PRN
  Filled 2019-10-23: qty 1

## 2019-10-23 MED ORDER — SODIUM CHLORIDE 0.9 % IV SOLN: 250.0000 mL | INTRAVENOUS | Status: DC | PRN

## 2019-10-23 MED ORDER — HALOPERIDOL 2 MG PO TABS
2.0000 mg | ORAL_TABLET | ORAL | Status: DC | PRN
  Filled 2019-10-23: qty 1

## 2019-10-23 MED ORDER — LACTULOSE ENEMA
300.0000 mL | Freq: Three times a day (TID) | ORAL | Status: DC
  Administered 2019-10-23: 300 mL via RECTAL
  Filled 2019-10-23 (×2): qty 300

## 2019-10-23 MED ORDER — HYDROMORPHONE HCL 1 MG/ML IJ SOLN
0.5000 mg | INTRAMUSCULAR | Status: DC | PRN
  Administered 2019-10-23 – 2019-10-24 (×2): 0.5 mg via INTRAVENOUS
  Filled 2019-10-23 (×2): qty 0.5

## 2019-10-23 MED ORDER — HALOPERIDOL LACTATE 2 MG/ML PO CONC
2.0000 mg | ORAL | Status: DC | PRN
  Filled 2019-10-23: qty 1

## 2019-10-23 MED ORDER — HALOPERIDOL LACTATE 5 MG/ML IJ SOLN: 2.0000 mg | INTRAMUSCULAR | Status: DC | PRN

## 2019-10-23 MED ORDER — OXYCODONE HCL 5 MG/5ML PO SOLN: 5.0000 mg | ORAL | Status: DC | PRN

## 2019-10-23 MED ORDER — ONDANSETRON HCL 4 MG/2ML IJ SOLN: 4.0000 mg | Freq: Four times a day (QID) | INTRAMUSCULAR | Status: DC | PRN

## 2019-10-23 MED ORDER — PHENOL 1.4 % MT LIQD
1.0000 | OROMUCOSAL | Status: DC | PRN
  Administered 2019-10-23: 1 via OROMUCOSAL
  Filled 2019-10-23: qty 177

## 2019-10-23 MED ORDER — FAMOTIDINE IN NACL 20-0.9 MG/50ML-% IV SOLN
20.0000 mg | INTRAVENOUS | Status: DC
  Administered 2019-10-23: 20 mg via INTRAVENOUS
  Filled 2019-10-23: qty 50

## 2019-10-23 MED ORDER — ACETAMINOPHEN 325 MG PO TABS: 650.0000 mg | ORAL_TABLET | Freq: Four times a day (QID) | ORAL | Status: DC | PRN

## 2019-10-23 MED ORDER — ACETAMINOPHEN 650 MG RE SUPP: 650.0000 mg | Freq: Four times a day (QID) | RECTAL | Status: DC | PRN

## 2019-10-23 MED ORDER — PANTOPRAZOLE SODIUM 40 MG IV SOLR: 40.0000 mg | INTRAVENOUS | Status: DC

## 2019-10-23 MED ORDER — SODIUM CHLORIDE 0.9% FLUSH
3.0000 mL | Freq: Two times a day (BID) | INTRAVENOUS | Status: DC
  Administered 2019-10-23 – 2019-10-24 (×2): 3 mL via INTRAVENOUS

## 2019-10-23 MED ORDER — LACTULOSE 10 GM/15ML PO SOLN
20.0000 g | Freq: Two times a day (BID) | ORAL | Status: DC
  Administered 2019-10-23 – 2019-10-28 (×8): 20 g via ORAL
  Filled 2019-10-23 (×10): qty 30

## 2019-10-23 MED ORDER — SODIUM CHLORIDE 0.9 % IV SOLN
500.0000 mg | INTRAVENOUS | Status: DC
  Administered 2019-10-23: 500 mg via INTRAVENOUS
  Filled 2019-10-23: qty 500

## 2019-10-23 MED ORDER — BIOTENE DRY MOUTH MT LIQD: 15.0000 mL | OROMUCOSAL | Status: DC | PRN

## 2019-10-23 MED ORDER — GLYCOPYRROLATE 0.2 MG/ML IJ SOLN: 0.2000 mg | INTRAMUSCULAR | Status: DC | PRN

## 2019-10-23 MED ORDER — OXYCODONE HCL 20 MG/ML PO CONC
5.0000 mg | ORAL | Status: DC | PRN
  Filled 2019-10-23: qty 1

## 2019-10-23 MED ORDER — ONDANSETRON 4 MG PO TBDP: 4.0000 mg | ORAL_TABLET | Freq: Four times a day (QID) | ORAL | Status: DC | PRN

## 2019-10-23 NOTE — Progress Notes (Signed)
[facility] Progress Note  Patient: Judy Meza    FHQ:[id number]  DOA: 10/22/2019     Date of Service: the patient was seen and examined on 10/23/2019  Brief hospital course: Past medical history of alcoholic liver disease with cirrhosis, recurrent admission for hepatic encephalopathy, chronic renal disease stage IIIb, tobacco abuse, pancytopenia.  Presents with confusion and agitation.  Found to have hepatic encephalopathy as well as acute on chronic kidney injury as well as oropharyngeal candidiasis causing dysphagia and odynophagia. Currently plan is comfort care based on discussion with family and the patient.  Assessment and Plan: 1.  Acute hepatic encephalopathy Patient has not been eating well at home. Patient has not been taking her medications for a while resulting in recurrent admissions with encephalopathy. Ammonia level elevated to 70s. CT head unremarkable for any focal deficit. Patient was significantly lethargic earlier in the morning but now more awake and interactive. Patient was given lactulose enema without any success.  Currently on oral lactulose. Currently comfort care.  2. decompensated cirrhosis of liver. MELD score 21 roughly 20% mortality in 3 months Patient with significantly poor prognosis given her poor p.o. intake, noncompliance with medical regimen as well as recurrent admission with various issues. Discussed with family who is the primary caregiver for the patient. Discussed with patient as well regarding her poor prognosis. Recommended option for hospice going forward would be the best option due to multiple acute medical issues. Family as well as patient both agreeable to this plan. We will monitor progress.  3.  Dysphagia and odynophagia Oral thrush Nystatin. Was given 1 dose of Diflucan. Comfort care protocol for now. Appreciate speech therapy consultation. Initial plan was to perform modified barium swallow but currently on hold.  4.   Tracheobronchitis. Patient had a significant irritation of her tracheal stoma. Had a large clot that was suctioned out by RT. Currently comfort care. Was given antibiotics for 1 day.  5.  Thrombocytopenia Chronic, due to cirrhosis.  Monitor.  6.  Acute kidney injury on chronic kidney disease stage IIIb.   ?  Hepatorenal syndrome Renal function significantly worse. Currently complicated situation.  IV fluids will cause volume retention.    7.  Goals of care conversation. Had extensive conversation with patient's sisters on 2 different phone calls as well as patient at bedside. Multiple comorbid condition both acute and chronic. Noncompliance is an issue. Poor p.o. intake with an ongoing dysphagia and odynophagia certainly makes situation worse. Currently comfort care protocol. If the poor p.o. intake continues as well as confusion remains a problem patient will be a candidate for residential hospice. We will monitor for stability.  Diet: Comfort feed  Advance goals of care discussion: DNR  Family Communication: no family was present at bedside, at the time of interview.  The pt provided permission to discuss medical plan with the family. Opportunity was given to ask question and all questions were answered satisfactorily.   Disposition:  Status is: Inpatient  Remains inpatient appropriate because:IV treatments appropriate due to intensity of illness or inability to take PO   Dispo: The patient is from: Home              Anticipated d/c is to: Residential hospice              Anticipated d/c date is: 3 days              Patient currently is not medically stable to d/c.  Subjective: No nausea no vomiting.  No  fever no chills.  No chest pain.  Fatigue and tiredness.  Minimally responsive.  Reports throat pain.  Reports short of breath.  Also cough.  Physical Exam:  General: Appear in mild distress, no Rash; Oral Mucosa Clear, moist. no Abnormal Neck Mass Or lumps,  Conjunctiva normal  Cardiovascular: S1 and S2 Present, no Murmur, Respiratory: increased respiratory effort, Bilateral Air entry present and bilateral  Crackles, no wheezes Abdomen: Bowel Sound present, Soft and difficult to assess  tenderness Extremities: bilateral  Pedal edema Neurology: lethargic and not oriented to time, place, and person affect flat in affect. no new focal deficit Gait not checked due to patient safety concerns  Vitals:   10/23/19 1646 10/23/19 1735 10/23/19 1823 10/23/19 1850  BP: (!) 162/90 (!) 157/95 (!) 155/90   Pulse: 63 65 63   Resp: 19 11 13    Temp: 97.7 F (36.5 C) 97.7 F (36.5 C) 97.8 F (36.6 C)   TempSrc: Oral Oral Oral   SpO2: 100% 100% 100%   Weight:    59.5 kg  Height:    5\' 2"  (1.575 m)    Intake/Output Summary (Last 24 hours) at 10/23/2019 1956 Last data filed at 10/23/2019 1800 Gross per 24 hour  Intake 2266.33 ml  Output --  Net 2266.33 ml   Filed Weights   10/23/19 1850  Weight: 59.5 kg    Data Reviewed: I have personally reviewed and interpreted daily labs, tele strips, imagings as discussed above. I reviewed all nursing notes, pharmacy notes, vitals, pertinent old records I have discussed plan of care as described above with RN and patient/family.  CBC: Recent Labs  Lab 10/22/19 2000 10/23/19 0428  WBC 4.7 4.8  NEUTROABS 2.9  --   HGB 10.7* 10.8*  HCT 32.9* 33.6*  MCV 93.5 93.9  PLT 100* 89*   Basic Metabolic Panel: Recent Labs  Lab 10/22/19 2000 10/23/19 0428  NA 136 136  K 4.3 5.0  CL 100 104  CO2 22 22  GLUCOSE 91 149*  BUN 44* 37*  CREATININE 3.22* 2.46*  CALCIUM 9.7 8.9    Studies: CT HEAD WO CONTRAST  Result Date: 10/22/2019 CLINICAL DATA:  Increasing confusion, combative EXAM: CT HEAD WITHOUT CONTRAST TECHNIQUE: Contiguous axial images were obtained from the base of the skull through the vertex without intravenous contrast. COMPARISON:  CT 08/03/2019 FINDINGS: Brain: No evidence of acute  infarction, hemorrhage, hydrocephalus, extra-axial collection, visible mass lesion or mass effect. Symmetric prominence of the ventricles, cisterns and sulci compatible with parenchymal volume loss. Patchy areas of white matter hypoattenuation are most compatible with chronic microvascular angiopathy. Vascular: Atherosclerotic calcification of the carotid siphons. No hyperdense vessel. Skull: No calvarial fracture or suspicious osseous lesion. No scalp swelling or hematoma. Sinuses/Orbits: Paranasal sinuses and mastoid air cells are predominantly clear. Included orbital structures are unremarkable. Other: None IMPRESSION: 1. No acute intracranial findings. 2. Mild, age advanced chronic microvascular angiopathy and parenchymal volume loss. Electronically Signed   By: [name] M.D.   On: 10/22/2019 20:55   DG CHEST PORT 1 VIEW  Result Date: 10/23/2019 CLINICAL DATA:  Shortness of breath. EXAM: PORTABLE CHEST 1 VIEW COMPARISON:  08/03/2019 chest radiograph and prior. 08/04/2019 CTA chest. FINDINGS: No focal airspace opacities, pneumothorax or pleural effusion. Cardiomediastinal silhouette is unchanged. No acute osseous abnormality. Sequela of tracheostomy. IMPRESSION: No focal airspace disease. Electronically Signed   By: Primitivo Gauze M.D.   On: 10/23/2019 09:07    Scheduled Meds: . olopatadine  1 drop Both  Eyes BID  . sodium chloride flush  3 mL Intravenous Q12H   Continuous Infusions: . sodium chloride    . [START ON 10/24/2019] fluconazole (DIFLUCAN) IV     PRN Meds: sodium chloride, acetaminophen **OR** acetaminophen, albuterol, antiseptic oral rinse, glycopyrrolate **OR** glycopyrrolate **OR** glycopyrrolate, haloperidol **OR** haloperidol **OR** haloperidol lactate, hydrocerin, HYDROmorphone (DILAUDID) injection, LORazepam **OR** LORazepam **OR** LORazepam, LORazepam, ondansetron **OR** ondansetron (ZOFRAN) IV, oxyCODONE **OR** oxyCODONE, phenol, polyvinyl alcohol, sodium chloride  flush  Time spent: 35 minutes  Author: Berle Mull, MD [facility] 10/23/2019 7:56 PM  To reach On-call, see care teams to locate the attending and reach out via [email or web address]. Between 7PM-7AM, please contact night-coverage If you still have difficulty reaching the attending provider, please page the [facility] (Director on Call) for [facility] on amion for assistance.

## 2019-10-23 NOTE — ED Notes (Signed)
Admitting MD notified of enema leaking out.  Pharmacy advised that lactulose pr is to be given diluted
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
## 2019-10-23 NOTE — Progress Notes (Addendum)
Pharmacy Antibiotic Note  Judy Meza is a 58 y.o. female admitted on 10/22/2019 with AMS. Patient is currently on azithromycin and ceftriaxone presumably for CAP.  Pharmacy has been consulted for fluconazole dosing for oropharyngeal candidiasis. Of note, patient has a h/o prolonged QT and is currently on azithromycin.   QTc 487 (10/10) AKI w/ SCr 3.22 >> 2.46 (BL ~ 1.5)  Plan: Fluconazole 200 mg iv once followed by 100 mg iv daily tentatively assuming SCr continues to trend in the right direction.   Will follow renal function and adjust accordingly. Planned duration of 7-14 days.   Will f/u K, Mg, and QTc. Discussed checking Mg, possibly substituting doxycycline for azithromycin with Dr. Posey Pronto. Azithromycin d/c.    Temp (24hrs), Avg:97.7 F (36.5 C), Min:97.7 F (36.5 C), Max:97.7 F (36.5 C)  Recent Labs  Lab 10/22/19 2000 10/23/19 0428  WBC 4.7 4.8  CREATININE 3.22* 2.46*    CrCl cannot be calculated (Unknown ideal weight.).    No Known Allergies   Thank you for allowing pharmacy to be a part of this patients care.  Ulice Dash D 10/23/2019 2:12 PM

## 2019-10-23 NOTE — Progress Notes (Signed)
SLP Cancellation Note  Patient Details Name: Judy Meza MRN: [id number] DOB: [birth year]   Cancelled treatment:       Reason Eval/Treat Not Completed: Other (comment) (order received to discontinue SLP, please reorder if desire. thanks.)  [name], MS [facility] SLP Acute Rehab Services Office [phone] Pager [phone]   [name] 10/23/2019, 7:21 PM

## 2019-10-23 NOTE — Progress Notes (Signed)
PT Cancellation Note  Patient Details Name: Judy Meza MRN: [id number] DOB: 1960/09/13   Cancelled Treatment:    Reason Eval/Treat Not Completed: Patient not medically ready, not very alert, to have further test for swallowing. Will check back tomorrow. Noted inpatient status.    Claretha Cooper 10/23/2019, 2:10 PM Tresa Endo PT Acute Rehabilitation Services Pager [phone] Office [phone]

## 2019-10-23 NOTE — ED Notes (Signed)
Holding PO meds until swallow screen is completed.

## 2019-10-23 NOTE — ED Notes (Signed)
Respiratory consulted to suction pt stoma prior to swallow screen.

## 2019-10-23 NOTE — ED Notes (Signed)
Day shift is continuing to hold oral meds except lactulose per provider order.

## 2019-10-23 NOTE — ED Notes (Addendum)
Respiratory at bedside to suction patient

## 2019-10-23 NOTE — Progress Notes (Signed)
RT called to suction pt stoma. RT obtained a moderate amount of bright red bloody secretions. Pt tolerated well with no desaturations or HR changes. RT asked pt if she was okay and pt nodded her head indicating yes she was okay. RN made aware of bloody secretions, RN states she will update the MD. Vitals are stable at this time, RT will continue to monitor.

## 2019-10-23 NOTE — ED Notes (Signed)
1000 mL lactulose enema instilled via Flexi-seal, diluted per order. When repositioned, most of the fluid leaked past the inflated balloon and onto the bed. Most of the enema was not retained. Pharmacy being consulted.

## 2019-10-23 NOTE — ED Notes (Signed)
Waiting on swallow screen in order to administer PO meds to pt. Staff on the way from [facility] to complete screening.

## 2019-10-24 MED ORDER — PAROXETINE HCL 20 MG PO TABS
40.0000 mg | ORAL_TABLET | Freq: Every day | ORAL | Status: DC
  Administered 2019-10-24 – 2019-10-28 (×5): 40 mg via ORAL
  Filled 2019-10-24 (×5): qty 2

## 2019-10-24 MED ORDER — NADOLOL 20 MG PO TABS
40.0000 mg | ORAL_TABLET | Freq: Every day | ORAL | Status: DC
  Administered 2019-10-24 – 2019-10-28 (×5): 40 mg via ORAL
  Filled 2019-10-24: qty 2
  Filled 2019-10-24: qty 1
  Filled 2019-10-24 (×4): qty 2

## 2019-10-24 MED ORDER — ALBUTEROL SULFATE HFA 108 (90 BASE) MCG/ACT IN AERS: 2.0000 | INHALATION_SPRAY | Freq: Four times a day (QID) | RESPIRATORY_TRACT | Status: DC | PRN

## 2019-10-24 MED ORDER — ENSURE ENLIVE PO LIQD
237.0000 mL | Freq: Two times a day (BID) | ORAL | Status: DC
  Administered 2019-10-24 – 2019-10-28 (×8): 237 mL via ORAL

## 2019-10-24 MED ORDER — GABAPENTIN 100 MG PO CAPS
200.0000 mg | ORAL_CAPSULE | Freq: Three times a day (TID) | ORAL | Status: DC
  Administered 2019-10-24 – 2019-10-28 (×12): 200 mg via ORAL
  Filled 2019-10-24 (×12): qty 2

## 2019-10-24 MED ORDER — FAMOTIDINE 20 MG PO TABS
20.0000 mg | ORAL_TABLET | Freq: Every day | ORAL | Status: DC
  Administered 2019-10-24 – 2019-10-28 (×5): 20 mg via ORAL
  Filled 2019-10-24 (×5): qty 1

## 2019-10-24 MED ORDER — FUROSEMIDE 40 MG PO TABS: 40.0000 mg | ORAL_TABLET | Freq: Every day | ORAL | Status: DC

## 2019-10-24 MED ORDER — METHOCARBAMOL 500 MG PO TABS
500.0000 mg | ORAL_TABLET | Freq: Three times a day (TID) | ORAL | Status: DC | PRN
  Administered 2019-10-24 – 2019-10-27 (×4): 500 mg via ORAL
  Filled 2019-10-24 (×6): qty 1

## 2019-10-24 MED ORDER — RIFAXIMIN 550 MG PO TABS
550.0000 mg | ORAL_TABLET | Freq: Two times a day (BID) | ORAL | Status: DC
  Administered 2019-10-24 – 2019-10-28 (×9): 550 mg via ORAL
  Filled 2019-10-24 (×9): qty 1

## 2019-10-24 MED ORDER — SOFOSBUVIR-VELPATASVIR 400-100 MG PO TABS
1.0000 | ORAL_TABLET | Freq: Every day | ORAL | Status: DC
  Administered 2019-10-25 – 2019-10-28 (×4): 1 via ORAL

## 2019-10-24 MED ORDER — FOLIC ACID 1 MG PO TABS
1.0000 mg | ORAL_TABLET | Freq: Every day | ORAL | Status: DC
  Administered 2019-10-24 – 2019-10-28 (×5): 1 mg via ORAL
  Filled 2019-10-24 (×5): qty 1

## 2019-10-24 NOTE — Evaluation (Signed)
SLP  Note  Patient Details Name: Burnett Lieber MRN: [id number] DOB: 10/02/1960      Order for MBS received.  Will plan for tomorrow 10/25/2019 to allow instrumental evaluation and assure pt is not least restrictive diet.  Spoke to MD earlier today re: pt.  Thanks for this order.   [name], MS [facility] SLP Acute Rehab Services Office [phone] Pager [phone]      [name] 10/24/2019, 5:54 PM

## 2019-10-24 NOTE — TOC Initial Note (Addendum)
Transition of Care [facility]) - Initial/Assessment Note    Patient Details  Name: Judy Meza MRN: [id number] Date of Birth: 11-Dec-1960  [facility] CM/SW Contact:    Joaquin Courts, RN Phone Number: 10/24/2019, 3:10 PM  Clinical Narrative:     CM spoke with patient's sister [name] who is the legal guardian for patient.  Patient will need placement into SNF. Sister reports that patient will most likely need to remain in SNF long term, as she does not feel she will rehab enough to continue living at home safely and family cannot provide 24 hour care.  [facility] and CM spoke with [facility] rep regarding SNF need.  FL2 was sent out to [facility] contacted facilities.  Per [facility] rep, agency has been working with the family on SNF placement and in order for patient to remain in facility long term, family will need to revoke [facility] services.  [facility] is prepared for this and states will continue to work with family even while patient is getting short term rehab to find permanent placement for her if she is unable to remain in one of the currently contracted facilities.                   Expected Discharge Plan: Skilled Nursing Facility Barriers to Discharge: Continued Medical Work up   Patient Goals and CMS Choice Patient states their goals for this hospitalization and ongoing recovery are:: to go to facility CMS Medicare.gov Compare Post Acute Care list provided to:: Patient Represenative (must comment) Choice offered to / list presented to : [facility] POA / Guardian  Expected Discharge Plan and Services Expected Discharge Plan: [place]   Discharge Planning Services: CM Consult Post Acute Care Choice: [place]                   DME Arranged: N/A DME Agency: NA       HH Arranged: NA          Prior Living Arrangements/Services   Lives with:: Self Patient language and need for interpreter reviewed:: Yes Do you feel safe  going back to the place where you live?: Yes      Need for Family Participation in Patient Care: Yes (Comment) Care giver support system in place?: Yes (comment)   Criminal Activity/Legal Involvement Pertinent to Current Situation/Hospitalization: No - Comment as needed  Activities of Daily Living Home Assistive Devices/Equipment: Eyeglasses, Vent/Trach supplies ADL Screening (condition at time of admission) Patient's cognitive ability adequate to safely complete daily activities?: No Is the patient deaf or have difficulty hearing?: No Does the patient have difficulty seeing, even when wearing glasses/contacts?: No Does the patient have difficulty concentrating, remembering, or making decisions?: Yes Patient able to express need for assistance with ADLs?: Yes Does the patient have difficulty dressing or bathing?: Yes Independently performs ADLs?: No Communication: Needs assistance Is this a change from baseline?: Pre-admission baseline Dressing (OT): Needs assistance Is this a change from baseline?: Pre-admission baseline Grooming: Needs assistance Is this a change from baseline?: Pre-admission baseline Feeding: Needs assistance Is this a change from baseline?: Pre-admission baseline Bathing: Needs assistance Is this a change from baseline?: Pre-admission baseline Toileting: Needs assistance Is this a change from baseline?: Pre-admission baseline In/Out Bed: Needs assistance Is this a change from baseline?: Pre-admission baseline Walks in Home: Needs assistance Is this a change from baseline?: Pre-admission baseline Does the patient have difficulty walking or climbing stairs?: No  Weakness of Legs: None Weakness of Arms/Hands: None  Permission Sought/Granted                  Emotional Assessment       Orientation: : Fluctuating Orientation (Suspected and/or reported [place])   Psych Involvement: No (comment)  Admission diagnosis:  Hepatic encephalopathy (HCC)  [K72.90] SOB (shortness of breath) [R06.02] AKI (acute kidney injury) ([place]) [N17.9] Acute hepatic encephalopathy [K72.00] Patient Active Problem List   Diagnosis Date Noted  . Secondary esophageal varices with bleeding ([place])   . Esophageal stricture   . Cirrhosis ([place])   . Decompensated hepatic cirrhosis ([place]) 10/03/2019  . Acute on chronic anemia 10/03/2019  . Cancer screening 08/14/2019  . Back pain with radiculopathy 08/07/2019  . Recurrent falls 08/04/2019  . Multiple falls at home 08/03/2019  . 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 Goals of care, counseling/discussion 07/28/2019  . Acute encephalopathy 06/05/2019  . Chest pain 06/04/2019  . QT prolongation 06/04/2019  . Metabolic acidosis 17/00/1749  . Dementia without behavioral disturbance ([place]) 05/10/2019  . Chronic hepatitis C without hepatic coma ([place]) 05/10/2019  . Hepatic encephalopathy ([place]) 05/09/2019  . Pancytopenia, acquired ([place])   . Cirrhosis of liver ([place])   . CKD (chronic kidney disease), stage III ([place]) 04/29/2019  . COPD with chronic bronchitis ([place]) 04/29/2019  . Tobacco abuse 04/29/2019  . Alcoholism in remission ([place]) 04/29/2019  . Acute hepatic encephalopathy 04/29/2019  . AKI (acute kidney injury) ([place]) 04/29/2019  . Alcoholic cirrhosis of liver with ascites ([place]) 04/29/2019  . Thrombocytopenia ([place]) 04/29/2019  . Hyperkalemia 04/29/2019   PCP:  Waylan Rocher, MD Pharmacy:   [id number] - [place], [place] [address] Phone: [phone] Fax: [place], [place] - [place] [address] Phone: [phone] Fax: [phone]     Social Determinants of Health (SDOH) Interventions    Readmission Risk Interventions Readmission Risk Prevention Plan 10/05/2019 08/07/2019 06/06/2019  Transportation Screening Complete Complete Complete  HRI  or Home Care Consult - Not Complete -  HRI or Home Care Consult comments - needs SNF -  Social Work Consult for [place] Planning/Counseling - Complete -  Palliative Care Screening - Not Applicable -  Medication Review (RN Care Manager) Complete Complete Complete  PCP or Specialist appointment within 3-5 days of discharge Complete - -  [place] or Home Care Consult Complete - Complete  SW Recovery Care/Counseling Consult Complete - Complete  Palliative Care Screening Not Applicable - Not [place] Not Applicable - Complete

## 2019-10-24 NOTE — NC FL2 (Signed)
[place] LEVEL OF CARE SCREENING TOOL     IDENTIFICATION  Patient Name: Judy Meza Birthdate: Sep 01, 1960 Sex: female Admission Date (Current Location): 10/22/2019  [facility] and [place] Number:  Herbalist and Address:  [facility],  [place], [place]      Provider Number: [id number]  Attending Physician Name and Address:  Lavina Hamman, MD  Relative Name and Phone Number:       Current Level of Care: Hospital Recommended Level of Care: [place] Prior Approval Number:    Date Approved/Denied:   PASRR Number: [id number]  Discharge Plan: SNF    Current Diagnoses: Patient Active Problem List   Diagnosis Date Noted  . Secondary esophageal varices with bleeding ([place])   . Esophageal stricture   . Cirrhosis ([place])   . Decompensated hepatic cirrhosis ([place]) 10/03/2019  . Acute on chronic anemia 10/03/2019  . Cancer screening 08/14/2019  . Back pain with radiculopathy 08/07/2019  . Recurrent falls 08/04/2019  . Multiple falls at home 08/03/2019  . 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 Goals of care, counseling/discussion 07/28/2019  . Acute encephalopathy 06/05/2019  . Chest pain 06/04/2019  . QT prolongation 06/04/2019  . Metabolic acidosis 24/58/0998  . Dementia without behavioral disturbance ([place]) 05/10/2019  . Chronic hepatitis C without hepatic coma ([place]) 05/10/2019  . Hepatic encephalopathy ([place]) 05/09/2019  . Pancytopenia, acquired ([place])   . Cirrhosis of liver ([place])   . CKD (chronic kidney disease), stage III ([place]) 04/29/2019  . COPD with chronic bronchitis ([place]) 04/29/2019  . Tobacco abuse 04/29/2019  . Alcoholism in remission ([place]) 04/29/2019  . Acute hepatic encephalopathy 04/29/2019  . AKI (acute kidney injury) ([place]) 04/29/2019  . Alcoholic cirrhosis of liver with ascites ([place]) 04/29/2019  . Thrombocytopenia ([place]) 04/29/2019  . Hyperkalemia 04/29/2019    Orientation  RESPIRATION BLADDER Height & Weight     Self, Time, Place  Normal Continent Weight: 59.5 kg Height:  5\' 2"  (157.5 cm)  BEHAVIORAL SYMPTOMS/MOOD NEUROLOGICAL BOWEL NUTRITION STATUS      Incontinent Diet  AMBULATORY STATUS COMMUNICATION OF NEEDS Skin   Extensive Assist Verbally Normal                       Personal Care Assistance Level of Assistance  Bathing, Dressing, Total care Bathing Assistance: Maximum assistance   Dressing Assistance: Maximum assistance Total Care Assistance: Maximum assistance   Functional Limitations Info             SPECIAL CARE FACTORS FREQUENCY  PT (By licensed PT), OT (By licensed OT)     PT Frequency: 5x weekly OT Frequency: 5x weekly            Contractures Contractures Info: Not present    Additional Factors Info  Code Status, Allergies Code Status Info: DNR Allergies Info: NKDA           Current Medications (10/24/2019):  This is the current hospital active medication list Current Facility-Administered Medications  Medication Dose Route Frequency Provider Last Rate Last Admin  . albuterol (PROVENTIL) (2.5 MG/3ML) 0.083% nebulizer solution 2.5 mg  2.5 mg Nebulization Q6H PRN Gala Romney L, MD      . famotidine (PEPCID) tablet 20 mg  20 mg Oral Daily Lavina Hamman, MD      . feeding supplement (ENSURE ENLIVE) (ENSURE ENLIVE) liquid 237 mL  237 mL Oral BID BM Lavina Hamman, MD      .  folic acid (FOLVITE) tablet 1 mg  1 mg Oral Daily Lavina Hamman, MD      . furosemide (LASIX) tablet 40 mg  40 mg Oral Daily Lavina Hamman, MD      . gabapentin (NEURONTIN) capsule 200 mg  200 mg Oral TID Lavina Hamman, MD      . hydrocerin (EUCERIN) cream 1 application  1 application Topical PRN Elwyn Reach, MD      . lactulose (CHRONULAC) 10 GM/15ML solution 20 g  20 g Oral BID Lavina Hamman, MD   20 g at 10/24/19 0859  . nadolol (CORGARD) tablet 40 mg  40 mg Oral Daily Lavina Hamman, MD      . nystatin (MYCOSTATIN)  100000 UNIT/ML suspension 500,000 Units  5 mL Oral QID Lavina Hamman, MD   500,000 Units at 10/24/19 0859  . olopatadine (PATANOL) 0.1 % ophthalmic solution 1 drop  1 drop Both Eyes BID Elwyn Reach, MD   1 drop at 10/24/19 1325  . PARoxetine (PAXIL) tablet 40 mg  40 mg Oral Daily Lavina Hamman, MD      . phenol [facility]) mouth spray 1 spray  1 spray Mouth/Throat PRN Lavina Hamman, MD   1 spray at 10/23/19 1637  . polyvinyl alcohol (LIQUIFILM TEARS) 1.4 % ophthalmic solution 1 drop  1 drop Both Eyes BID PRN Elwyn Reach, MD      . rifaximin (XIFAXAN) tablet 550 mg  550 mg Oral BID Lavina Hamman, MD      . Derrill Memo ON 10/25/2019] Sofosbuvir-Velpatasvir 400-100 MG TABS 1 tablet  1 tablet Oral Daily Lavina Hamman, MD         Discharge Medications: Please see discharge summary for a list of discharge medications.  Relevant Imaging Results:  Relevant Lab Results:   Additional Information SSN [id number]  Joaquin Courts, RN

## 2019-10-24 NOTE — Progress Notes (Signed)
[facility] Progress Note  Patient: Judy Meza    WNU:[id number]  DOA: 10/22/2019     Date of Service: the patient was seen and examined on 10/24/2019  Brief hospital course: Past medical history of alcoholic liver disease with cirrhosis, recurrent admission for hepatic encephalopathy, chronic renal disease stage IIIb, tobacco abuse, pancytopenia.  Presents with confusion and agitation.  Found to have hepatic encephalopathy as well as acute on chronic kidney injury as well as oropharyngeal candidiasis causing dysphagia and odynophagia. Patient was minimally responsive and confused therefore initial plan was to pursue comfort care.  But after 1 dose of lactulose enema as well as 1 dose of oral lactulose the patient is significantly awake alert and oriented x3.  Currently plan is plan is to continue to treat treatable within the limits of DNR/DNI, arrange for safe discharge plan.  Assessment and Plan: 1.  Acute hepatic encephalopathy Patient has not been eating well at home. Patient has not been taking her medications for a while resulting in recurrent admissions with encephalopathy. Ammonia level elevated to 70s. CT head unremarkable for any focal deficit. Patient was significantly lethargic earlier hospital course, as the time is progressing after receiving 1 dose of lactulose enema she is significantly awake alert and interactive x3. Currently on oral lactulose and rifaximin.  No asterixis.  2. decompensated cirrhosis of liver. MELD score 21 roughly 20% mortality in 3 months Patient with significantly poor prognosis given her poor p.o. intake, noncompliance with medical regimen as well as recurrent admission with various issues. Discussed with family who is the primary caregiver for the patient. Discussed with patient as well regarding her poor prognosis. Recommended option for hospice going forward would be the best option due to multiple acute medical issues. Patient currently  focus on comfort but willing to go to SNF for now. We will get PT evaluation. Social worker consulted for SNF placement. Family unable to provide 24/7 care at home and patient is not compliant with regimen of medication on her own. Family also reports frequent drinking of wine by patient as well as to smoking which is not helping her cirrhosis. Currently on lactulose and rifaximin. Also on nadolol. Holding Lasix and Aldactone for now but resume when stable. 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  Not on PPI as sofosbuvir has interaction with Protonix.   Can use esomprazole outside of the hospital instead of Pepcid, both of which has no interaction per pharmacy.  3.  Dysphagia and odynophagia Oral thrush Nystatin. Was given 1 dose of Diflucan. Appreciate speech therapy consultation. Will go ahead with the initial plan for a modified barium swallow.  4.  Tracheobronchitis. Tracheal stoma status.  No tracheostomy tube. Patient had a significant irritation of her tracheal stoma. Had a large clot that was suctioned out by RT. Was given antibiotics for 1 day. Currently appears irritated but no evidence of acute infection.  Just monitor.  No antibiotics.  5.  Thrombocytopenia Chronic, due to cirrhosis.  Monitor.  6.  Acute kidney injury on chronic kidney disease stage IIIb.   ?  Hepatorenal syndrome Renal function significantly worse.  Improved initially with IV hydration. Currently holding Lasix.  Monitor.  Unable to draw labs.   7.  Goals of care conversation. On 10/23/2019, had extensive conversation with patient's sisters on 2 different phone calls as well as patient at bedside. Multiple comorbid condition both acute and chronic. Noncompliance is a huge issue. Poor p.o. intake with an ongoing dysphagia  and odynophagia certainly makes situation worse. On that day family preferred comfort care protocol. On 10/24/2019.  Patient has a dramatic change in  condition.  Alert awake and oriented x3.  Answering questions following commands appropriately.  Still wants to focus on comfort part but also wants to go on SNF. Does not want any heroic measures remains DNR. Family at bedside.  Also prefers placement for the patient given that they do not have any 24/7 supervision for the patient at home and patient remains noncompliant with medication without supervision.  Diet: Dysphagia 1 nectar thick liquid diet.  Modified barium swallow on 10/25/2019. DVT Prophylaxis: SCD, pharmacological prophylaxis contraindicated due to Thrombocytopenia   Advance goals of care discussion: DNR  Family Communication: family was present at bedside, at the time of interview.  The pt provided permission to discuss medical plan with the family. Opportunity was given to ask question and all questions were answered satisfactorily.   Disposition:  Status is: Inpatient  Remains inpatient appropriate because:Unsafe d/c plan  Dispo:  Patient From: Home  Planned Disposition: [facility]  Expected discharge date: 10/25/19  Medically stable for discharge: No  Subjective: No nausea no vomiting.  No fever no chills.  No chest pain.  No abdominal pain.  No headache.  Physical Exam:  General: Appear in mild distress, no Rash; Oral Mucosa shows Diffuse erythema, minimal thrush. no Abnormal Neck Mass Or lumps, Conjunctiva normal  Cardiovascular: S1 and S2 Present, no Murmur, Respiratory: increased respiratory effort, Bilateral Air entry present and CTA, no Crackles, no wheezes Abdomen: Bowel Sound present, Soft and no tenderness Extremities: trace Pedal edema Neurology: alert and oriented to time, place, and person affect appropriate. no new focal deficit Gait not checked due to patient safety concerns Patient communicates primarily by mouthing the words and occasionally writing down.  Vitals:   10/24/19 0814 10/24/19 1121 10/24/19 1521 10/24/19 1534  BP:    115/80   Pulse:   69   Resp:   14   Temp:   98.9 F (37.2 C)   TempSrc:   Oral   SpO2: 93% 94% 97% 96%  Weight:      Height:        Intake/Output Summary (Last 24 hours) at 10/24/2019 1626 Last data filed at 10/24/2019 0300 Gross per 24 hour  Intake 0 ml  Output --  Net 0 ml   Filed Weights   10/23/19 1850  Weight: 59.5 kg    Data Reviewed: I have personally reviewed and interpreted daily labs, tele strips, imagings as discussed above. I reviewed all nursing notes, pharmacy notes, vitals, pertinent old records I have discussed plan of care as described above with RN and patient/family.  CBC: Recent Labs  Lab 10/22/19 2000 10/23/19 0428  WBC 4.7 4.8  NEUTROABS 2.9  --   HGB 10.7* 10.8*  HCT 32.9* 33.6*  MCV 93.5 93.9  PLT 100* 89*   Basic Metabolic Panel: Recent Labs  Lab 10/22/19 2000 10/23/19 0428  NA 136 136  K 4.3 5.0  CL 100 104  CO2 22 22  GLUCOSE 91 149*  BUN 44* 37*  CREATININE 3.22* 2.46*  CALCIUM 9.7 8.9    Studies: No results found.  Scheduled Meds: . famotidine  20 mg Oral Daily  . feeding supplement (ENSURE ENLIVE)  237 mL Oral BID BM  . folic acid  1 mg Oral Daily  . furosemide  40 mg Oral Daily  . gabapentin  200 mg Oral TID  .  lactulose  20 g Oral BID  . nadolol  40 mg Oral Daily  . nystatin  5 mL Oral QID  . olopatadine  1 drop Both Eyes BID  . PARoxetine  40 mg Oral Daily  . rifaximin  550 mg Oral BID  . [START ON 10/25/2019] Sofosbuvir-Velpatasvir  1 tablet Oral Daily   Continuous Infusions: PRN Meds: albuterol, hydrocerin, phenol, polyvinyl alcohol  Time spent: 35 minutes  Author: Berle Mull, MD [facility] 10/24/2019 4:26 PM  To reach On-call, see care teams to locate the attending and reach out via [email or web address]. Between 7PM-7AM, please contact night-coverage If you still have difficulty reaching the attending provider, please page the [place] [facility] (Director on Call) for [facility] on amion for assistance.

## 2019-10-25 ENCOUNTER — Inpatient Hospital Stay (HOSPITAL_COMMUNITY): Payer: Medicaid Other

## 2019-10-25 LAB — CBC WITH DIFFERENTIAL/PLATELET
Abs Immature Granulocytes: 0.06 10*3/uL (ref 0.00–0.07)
Basophils Absolute: 0 10*3/uL (ref 0.0–0.1)
Basophils Relative: 0 %
Eosinophils Absolute: 0.1 10*3/uL (ref 0.0–0.5)
Eosinophils Relative: 1 %
HCT: 37 % (ref 36.0–46.0)
Hemoglobin: 11.5 g/dL — ABNORMAL LOW (ref 12.0–15.0)
Immature Granulocytes: 1 %
Lymphocytes Relative: 17 %
Lymphs Abs: 1.2 10*3/uL (ref 0.7–4.0)
MCH: 30.3 pg (ref 26.0–34.0)
MCHC: 31.1 g/dL (ref 30.0–36.0)
MCV: 97.4 fL (ref 80.0–100.0)
Monocytes Absolute: 1 10*3/uL (ref 0.1–1.0)
Monocytes Relative: 15 %
Neutro Abs: 4.5 10*3/uL (ref 1.7–7.7)
Neutrophils Relative %: 66 %
Platelets: 89 10*3/uL — ABNORMAL LOW (ref 150–400)
RBC: 3.8 MIL/uL — ABNORMAL LOW (ref 3.87–5.11)
RDW: 18.4 % — ABNORMAL HIGH (ref 11.5–15.5)
WBC: 6.8 10*3/uL (ref 4.0–10.5)
nRBC: 0 % (ref 0.0–0.2)

## 2019-10-25 LAB — COMPREHENSIVE METABOLIC PANEL
ALT: 22 U/L (ref 0–44)
AST: 49 U/L — ABNORMAL HIGH (ref 15–41)
Albumin: 3.4 g/dL — ABNORMAL LOW (ref 3.5–5.0)
Alkaline Phosphatase: 85 U/L (ref 38–126)
Anion gap: 14 (ref 5–15)
BUN: 42 mg/dL — ABNORMAL HIGH (ref 6–20)
CO2: 20 mmol/L — ABNORMAL LOW (ref 22–32)
Calcium: 9.5 mg/dL (ref 8.9–10.3)
Chloride: 106 mmol/L (ref 98–111)
Creatinine, Ser: 2.7 mg/dL — ABNORMAL HIGH (ref 0.44–1.00)
GFR, Estimated: 19 mL/min — ABNORMAL LOW (ref >60)
Glucose, Bld: 101 mg/dL — ABNORMAL HIGH (ref 70–99)
Potassium: 4.3 mmol/L (ref 3.5–5.1)
Sodium: 140 mmol/L (ref 135–145)
Total Bilirubin: 1.4 mg/dL — ABNORMAL HIGH (ref 0.3–1.2)
Total Protein: 8.1 g/dL (ref 6.5–8.1)

## 2019-10-25 LAB — MAGNESIUM: Magnesium: 2.4 mg/dL (ref 1.7–2.4)

## 2019-10-25 LAB — PROTIME-INR
INR: 1.3 — ABNORMAL HIGH (ref 0.8–1.2)
Prothrombin Time: 15.3 seconds — ABNORMAL HIGH (ref 11.4–15.2)

## 2019-10-25 NOTE — Progress Notes (Signed)
RT in to see pt. with Stoma on rounds, upon arrival to room pt. seen standing by bedside with Stoma covered with dressing/taped noted to be in no apparent distress, 92% room air/pulse-92, ambulating w/o difficulty, bedside pulse oximeter noted to be off, H.O.B airway sheet remains in place, made pt. aware to notify staff if needed.

## 2019-10-25 NOTE — Care Management Important Message (Signed)
Important Message  Patient Details IM Letter given to the Patient Name: Lizabeth Fellner MRN: [id number] Date of Birth: [birth year]   Medicare Important Message Given:  Yes     Kerin Salen 10/25/2019, 10:49 AM

## 2019-10-25 NOTE — Evaluation (Signed)
Physical Therapy One Time Evaluation Patient Details Name: Judy Meza MRN: [id number] DOB: August 05, 1960 Today's Date: 10/25/2019   History of Present Illness  59 y.o. female with medical history significant of COPD, throat cancer status post tracheostomy, cirrhosis of liver, chronic kidney disease stage III, hypertension, alcohol abuse in remission, tobacco abuse, history of IVDU in remission, hepatitis C, depression, dementia.  Pt Found to have hepatic encephalopathy as well as acute on chronic kidney injury as well as oropharyngeal candidiasis causing dysphagia and odynophagia.  Clinical Impression  Patient evaluated by Physical Therapy with no further acute PT needs identified. All education has been completed and the patient has no further questions. See below for any follow-up Physical Therapy or equipment needs. PT is signing off. Thank you for this referral.  Pt ambulated around unit without LOB or unsteadiness.  Pt has been up in room ad lib per nursing staff.  Pt states she has falls at home due to taking lactulose.  No PT needs identified at this time.       Follow Up Recommendations No PT follow up    Equipment Recommendations  None recommended by PT    Recommendations for Other Services       Precautions / Restrictions Precautions Precautions: Fall      Mobility  Bed Mobility Overal bed mobility: Independent                Transfers Overall transfer level: Modified independent Equipment used: None                Ambulation/Gait Ambulation/Gait assistance: Supervision Gait Distance (Feet): 200 Feet Assistive device: None Gait Pattern/deviations: Step-through pattern;Decreased stride length     General Gait Details: slow shuffling pace, pt wearing her slippers from home, no unsteadiness or LOB  Stairs            Wheelchair Mobility    Modified Rankin (Stroke Patients Only)       Balance Overall balance assessment: History of Falls                                            Pertinent Vitals/Pain Pain Assessment: No/denies pain    Home Living Family/patient expects to be discharged to:: Private residence Living Arrangements: Alone Available Help at Discharge: Family Type of Home: Apartment Home Access: Elevator     Home Layout: One level Home Equipment: Environmental consultant - 2 wheels;Cane - single point;Shower seat;Hand held shower head      Prior Function Level of Independence: Needs assistance   Gait / Transfers Assistance Needed: using RW outside, holds onto objects in her environment.  ADL's / Homemaking Assistance Needed: mother brings food for pt. Sister pays all bills and sets up meds.  Comments: uses [facility]     Hand Dominance   Dominant Hand: Right    Extremity/Trunk Assessment        Lower Extremity Assessment Lower Extremity Assessment: Overall WFL for tasks assessed       Communication   Communication:  (trach stoma)  Cognition Arousal/Alertness: Awake/alert Behavior During Therapy: WFL for tasks assessed/performed Overall Cognitive Status: Within Functional Limits for tasks assessed                                        General Comments  General comments (skin integrity, edema, etc.): Pt reports falls at home due to using lactulose 3x/day    Exercises     Assessment/Plan    PT Assessment Patent does not need any further PT services  PT Problem List         PT Treatment Interventions      PT Goals (Current goals can be found in the Care Plan section)  Acute Rehab PT Goals PT Goal Formulation: All assessment and education complete, DC therapy    Frequency     Barriers to discharge        Co-evaluation               AM-PAC PT "6 Clicks" Mobility  Outcome Measure Help needed turning from your back to your side while in a flat bed without using bedrails?: None Help needed moving from lying on your back to sitting on the side of  a flat bed without using bedrails?: None Help needed moving to and from a bed to a chair (including a wheelchair)?: None Help needed standing up from a chair using your arms (e.g., wheelchair or bedside chair)?: None Help needed to walk in hospital room?: A Little Help needed climbing 3-5 steps with a railing? : A Little 6 Click Score: 22    End of Session   Activity Tolerance: Patient tolerated treatment well Patient left:  (pt up in room, nurse tech outside door and states pt up ad lib in room)   PT Visit Diagnosis: History of falling (Z91.81)    Time: 1011-1030 PT Time Calculation (min) (ACUTE ONLY): 19 min   Charges:   PT Evaluation $PT Eval Low Complexity: 1 Low     Kati PT, DPT Acute Rehabilitation Services Pager: [phone] Office: [phone]  York Ram E 10/25/2019, 12:07 PM

## 2019-10-25 NOTE — Progress Notes (Signed)
Patient refused AM lab draws.
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
## 2019-10-25 NOTE — Progress Notes (Signed)
Patients chart/orders reviewed with follow-up and noted that pt. has order for Modified Barium Swallow with Swallowing Functions for this date 10/25/2019 Reference to Dysphagia.

## 2019-10-25 NOTE — Progress Notes (Addendum)
Modified Barium Swallow Progress Note  Patient Details  Name: Judy Meza MRN: [id number] Date of Birth: [birth year]  Today's Date: 10/25/2019  Modified Barium Swallow completed.  Full report located under Chart Review in the Imaging Section.  Brief recommendations include the following:  Clinical Impression         Pt presents with minimal oral and mild pharyngo-cervical esophageal dysphagia due to h/o cancer s/p XRT and stoma impacting tracheal negative air pressure.  She is having aspiration of secretions without sensation- and viscous secretions retained in pharynx that is likely chronic in nature.  Decreased laryngeal closure allows laryngeal penetration of thin consistently and mild aspiration x2/10 boluses.  Various postures/strategies including chin tuck, breath hold swallow, HOB lowered to approx 45*, stoma occlusion with swallow/cough did not improve swallow enough to warrant usage.  With larger amount of aspiration of thin x1 - pt presented with reflexive cough.  She did not fully clear trace -min aspirates.  Pharyngeal clearance of pudding/cracker appeared functional.  Question if pt has some edema of epiglottis due to radiation.    Recommend pt's diet advance to dys3/thin with precautions for aspiration mitigation.  Using teach back including video feed, educated pt to findings/recommendations.  SLP would prefer pt drink water with meals.    Pt continues to report to this SLP with direct questiont - occasional issues with liquid coming from stoma when eating - advised her this is due to aspiration. Pt has been tolerating some low grade chronic aspiration.    Will follow up for po tolerance, reinforcement of exercises provided during prior admit and assure po tolerance.  Pt agreeable to plan.  Dysphagia, pharyngeal phase (R13.13);Dysphagia, pharyngoesophageal phase (R13.14);Dysphagia, unspecified (R13.10)  --  --  Mild aspiration risk                                    [name], MS [facility] SLP Acute Rehab Services Office [phone] Pager [phone]    [name] 10/25/2019,3:40 PM

## 2019-10-25 NOTE — TOC Progression Note (Signed)
Transition of Care [facility]) - Progression Note    Patient Details  Name: Judy Meza MRN: [id number] Date of Birth: Mar 03, 1960  [facility]) CM/SW Contact  Ross Ludwig, [place] Phone Number: 10/25/2019, 3:56 PM  Clinical Narrative:     CSW received a message from bedside nurse that patient's legal guardian [name], wanted an update on SNF placement for patient.  CSW informed her that there were still a couple of facilities pending.  CSW was given the contact number for [name] which is patient's Education officer, museum at [employer], [phone].  CSW contacted her and informed her that [name] was still pending.  CSW contacted [name] and spoke to admissions worker, she said she will talk with [facility] to discuss placement of patient.  CSW updated patient's sister, [phone] and informed her that CSW is just waiting to hear back from [facility] [employer].  CSW to continue to follow patient's progress throughout discharge planning.    Expected Discharge Plan: Skilled Nursing Facility Barriers to Discharge: Continued Medical Work up  Expected Discharge Plan and Services Expected Discharge Plan: [place]   Discharge Planning Services: CM Consult Post Acute Care Choice: [place]                   DME Arranged: N/A DME Agency: NA       HH Arranged: NA           Social Determinants of Health (SDOH) Interventions    Readmission Risk Interventions Readmission Risk Prevention Plan 10/05/2019 08/07/2019 06/06/2019  Transportation Screening Complete Complete Complete  HRI or Home Care Consult - Not Complete -  HRI or Home Care Consult comments - needs SNF -  Social Work Consult for [place] Planning/Counseling - Complete -  Palliative Care Screening - Not Applicable -  Medication Review Press photographer) Complete Complete Complete  PCP or Specialist appointment within 3-5 days of discharge Complete - -  [place] or Home Care Consult Complete  - Complete  SW Recovery Care/Counseling Consult Complete - Complete  Palliative Care Screening Not Applicable - Not [place] Not Applicable - Complete

## 2019-10-26 LAB — SARS CORONAVIRUS 2 BY RT PCR (HOSPITAL ORDER, PERFORMED IN ~~LOC~~ HOSPITAL LAB): SARS Coronavirus 2: NEGATIVE

## 2019-10-26 MED ORDER — HYDROXYZINE HCL 10 MG PO TABS
10.0000 mg | ORAL_TABLET | Freq: Three times a day (TID) | ORAL | Status: DC | PRN
  Administered 2019-10-26 – 2019-10-27 (×5): 10 mg via ORAL
  Filled 2019-10-26 (×5): qty 1

## 2019-10-26 NOTE — TOC Progression Note (Addendum)
Transition of Care [facility]) - Progression Note    Patient Details  Name: Judy Meza MRN: [id number] Date of Birth: 10-06-1960  Transition of Care [facility]) CM/SW Contact  Ross Ludwig, Welcome Phone Number: 10/26/2019, 12:04 PM  Clinical Narrative:    CSW spoke to [facility] and they are not able to accept patient.  Per [name] at [facility], [place] was asked to check with [facility] for placement.  CSW faxed requested information to SNF and left a message with admissions worker.  CSW continuing to follow patient's progress throughout discharge planning.  2:30pm CSW spoke to [place] at [place], she suggested to try [facility], [place] contacted [facility] they said they do not do short term contracts, and patient is not eligible to transition to SNF until November 1st.  [name] asked CSW to send to other SNFs, and so far, there have not been any other offers at this time.  CSW to continue to work on placement.  4:45pm  CSW spoke to [place] again at [place], she was going to try to contact [facility], and she also requested that CSW send information to [facility].  Still waiting for bed offers.   Expected Discharge Plan: Skilled Nursing Facility Barriers to Discharge: Continued Medical Work up  Expected Discharge Plan and Services Expected Discharge Plan: [place]   Discharge Planning Services: CM Consult Post Acute Care Choice: [place]                   DME Arranged: N/A DME Agency: NA       HH Arranged: NA           Social Determinants of Health (SDOH) Interventions    Readmission Risk Interventions Readmission Risk Prevention Plan 10/05/2019 08/07/2019 06/06/2019  Transportation Screening Complete Complete Complete  HRI or Home Care Consult - Not Complete -  HRI or Home Care Consult comments - needs SNF -  Social Work Consult for [place] Planning/Counseling - Complete -  Palliative Care Screening - Not Applicable -  Medication Review Human resources officer) Complete Complete Complete  PCP or Specialist appointment within 3-5 days of discharge Complete - -  [place] or Home Care Consult Complete - Complete  SW Recovery Care/Counseling Consult Complete - Complete  Palliative Care Screening Not Applicable - Not [place] Not Applicable - Complete
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
## 2019-10-26 NOTE — Progress Notes (Signed)
Refused to have Labs done. A/OX4.

## 2019-10-26 NOTE — Plan of Care (Signed)
  Problem: Education: Goal: Knowledge of General Education information will improve Description: Including pain rating scale, medication(s)/side effects and non-pharmacologic comfort measures Outcome: Progressing   Problem: Clinical Measurements: Goal: Will remain free from infection Outcome: Progressing Goal: Respiratory complications will improve Outcome: Progressing   Problem: Pain Managment: Goal: General experience of comfort will improve Outcome: Progressing   Problem: Safety: Goal: Ability to remain free from injury will improve Outcome: Progressing   Problem: Skin Integrity: Goal: Risk for impaired skin integrity will decrease Outcome: Progressing

## 2019-10-27 NOTE — Progress Notes (Addendum)
PROGRESS NOTE    Judy Meza  NID:[id number] DOB: 1960-08-24 DOA: 10/22/2019 PCP: Waylan Rocher, MD    Chief Complaint  Patient presents with  . Altered Mental Status    Brief Narrative:   Past medical history of alcoholic liver disease with cirrhosis, recurrent admission for hepatic encephalopathy, chronic renal disease stage IIIb, tobacco abuse, pancytopenia. Presents with confusion and agitation. Found to have hepatic encephalopathy as well as acute on chronic kidney injury as well as oropharyngeal candidiasis causing dysphagia and odynophagia. Patient was minimally responsive and confused therefore initial plan was to pursue comfort care.  But after 1 dose of lactulose enema as well as 1 dose of oral lactulose the patient is significantly awake alert and oriented x3.  Currently plan is plan is to continue to treat treatable within the limits of DNR/DNI,  Subjective:  No overnight event, no new complaints, awaiting for placement  Addendum: Patient is a PACE program patient, difficult to find skilled nursing facility, pace program will set up home health and home care, plan to discharge tomorrow   Assessment & Plan:   Active Problems:   Tobacco abuse   Acute hepatic encephalopathy   AKI (acute kidney injury) ([place])   Alcoholic cirrhosis of liver with ascites (HCC)   Thrombocytopenia (HCC)   Dementia without behavioral disturbance (HCC)   Acute on chronic anemia   1.Acute hepatic encephalopathy Patient has not been eating well at home, Patient has not been taking her medications for a while resulting in recurrent admissions with encephalopathy. Ammonia level elevated to 70s on admission. CT head unremarkable for any focal deficit. Patient was significantly lethargic earlier hospital course, s/p 1 dose of lactulose enema, she is significantly awake alert and interactive x3. Currently on oral lactulose and rifaximin.  No asterixis.  2.decompensated  cirrhosis of liver. MELD score 21 roughly 20% mortality in 3 months Patient with significantly poor prognosis given her poor p.o. intake, noncompliance with medical regimen as well as recurrent admission with various issues. Discussed with family who is the primary caregiver for the patient. Discussed with patient as well regarding her poor prognosis. Recommended option for hospice going forward would be the best option due to multiple acute medical issues. Patient currently focus on comfort but willing to go to SNF for now. Family unable to provide 24/7 care at home and patient is not compliant with regimen of medication on her own. Family also reports frequent drinking of wine by patient as well as to smoking which is not helping her cirrhosis. Currently on lactulose and rifaximin. Also on nadolol. Holding Lasix and Aldactone for now but resume when stable. 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  Not on PPI as sofosbuvir has interaction with Protonix.   Can use esomprazole outside of the hospital instead of Pepcid, both of which has no interaction per pharmacy.  3.Dysphagia and odynophagia Oral thrush Nystatin. Was given 1 dose of Diflucan. Appreciate speech therapy consultation. Underwent modified barium swallow on October 13, now started on dysphagia 3 diet and thin liquid per speech pathology recommendation  4.Tracheobronchitis. Tracheal stoma status.  No tracheostomy tube. Patient had a significant irritation of her tracheal stoma initially, Had a large clot that was suctioned out by RT. Was given antibiotics for 1 day. improved  5.Thrombocytopenia Chronic, due to cirrhosis. close to baseline, Monitor.  6.Acute kidney injury on chronic kidney disease stage IIIb. from ? Hepatorenal syndrome ua unremarkable,  Improved initially with IV hydration. Currently holding  Lasix.   Creatinine peaked at 3.22, still fluctuating but overall  appearTrending down Renal dosing medication   DVT prophylaxis: Place and maintain sequential compression device Start: 10/24/19 1631   Code Status:DNR Family Communication: patient Disposition:   Status is: Inpatient  Dispo:  Patient From: [facility]  Planned Disposition: Home with Health Care Svc  Expected discharge date: 10/28/19  Medically stable for discharge: Yes    Consultants:   none  Procedures:   none  Antimicrobials:   Rocephin/Zithromaxx1 on  October 11     Objective: Vitals:   10/26/19 1352 10/26/19 2015 10/27/19 0501 10/27/19 1301  BP: 104/66 95/73 95/67  100/68  Pulse: 68 70 74 66  Resp: 16 18 16 15   Temp: 98.1 F (36.7 C) 98.1 F (36.7 C) 98.1 F (36.7 C) 98.4 F (36.9 C)  TempSrc: Oral Oral Oral Oral  SpO2: 100% 100% 99% 100%  Weight:      Height:        Intake/Output Summary (Last 24 hours) at 10/27/2019 1601 Last data filed at 10/27/2019 1300 Gross per 24 hour  Intake 960 ml  Output --  Net 960 ml   Filed Weights   10/23/19 1850  Weight: 59.5 kg    Examination:  General exam: thin, frail, but calm, NAD, pleasant Respiratory system: Clear to auscultation. Respiratory effort normal. Cardiovascular system: S1 & S2 heard, RRR. No JVD, no murmur, No pedal edema. Gastrointestinal system: Abdomen is nondistended, soft and nontender. Normal bowel sounds heard. Central nervous system: Alert and oriented. No focal neurological deficits. Extremities: generalized weakness, no edema Skin: No rashes, lesions or ulcers Psychiatry: Judgement and insight appear normal. Mood & affect appropriate.     Data Reviewed: I have personally reviewed following labs and imaging studies  CBC: Recent Labs  Lab 10/22/19 2000 10/23/19 0428 10/25/19 0934  WBC 4.7 4.8 6.8  NEUTROABS 2.9  --  4.5  HGB 10.7* 10.8* 11.5*  HCT 32.9* 33.6* 37.0  MCV 93.5 93.9 97.4  PLT 100* 89* 89*    Basic Metabolic Panel: Recent Labs  Lab  10/22/19 2000 10/23/19 0428 10/25/19 0934  NA 136 136 140  K 4.3 5.0 4.3  CL 100 104 106  CO2 22 22 20*  GLUCOSE 91 149* 101*  BUN 44* 37* 42*  CREATININE 3.22* 2.46* 2.70*  CALCIUM 9.7 8.9 9.5  MG  --   --  2.4    GFR: Estimated Creatinine Clearance: 18.2 mL/min (A) (by C-G formula based on SCr of 2.7 mg/dL (H)).  Liver Function Tests: Recent Labs  Lab 10/22/19 2000 10/23/19 0428 10/25/19 0934  AST 52* 56* 49*  ALT 23 20 22   ALKPHOS 81 75 85  BILITOT 2.3* 1.6* 1.4*  PROT 8.7* 8.1 8.1  ALBUMIN 3.7 3.4* 3.4*    CBG: No results for input(s): GLUCAP in the last 168 hours.   Recent Results (from the past 240 hour(s))  Respiratory Panel by RT PCR (Flu A&B, Covid) - Nasopharyngeal Swab     Status: None   Collection Time: 10/22/19  9:56 PM   Specimen: Nasopharyngeal Swab  Result Value Ref Range Status   SARS Coronavirus 2 by RT PCR NEGATIVE NEGATIVE Final    Comment: (NOTE) SARS-CoV-2 target nucleic acids are NOT DETECTED.  The SARS-CoV-2 RNA is generally detectable in upper respiratoy specimens during the acute phase of infection. The lowest concentration of SARS-CoV-2 viral copies this assay can detect is 131 copies/mL. A negative result does not preclude SARS-Cov-2 infection  and should not be used as the sole basis for treatment or other patient management decisions. A negative result may occur with  improper specimen collection/handling, submission of specimen other than nasopharyngeal swab, presence of viral mutation(s) within the areas targeted by this assay, and inadequate number of viral copies (<131 copies/mL). A negative result must be combined with clinical observations, patient history, and epidemiological information. The expected result is Negative.  Fact Sheet for Patients:  PinkCheek.be  Fact Sheet for Healthcare Providers:  GravelBags.it  This test is no t yet approved or cleared by the  [place] FDA and  has been authorized for detection and/or diagnosis of SARS-CoV-2 by FDA under an Emergency Use Authorization (EUA). This EUA will remain  in effect (meaning this test can be used) for the duration of the COVID-19 declaration under Section 564(b)(1) of the Act, 21 U.S.C. section 360bbb-3(b)(1), unless the authorization is terminated or revoked sooner.     Influenza A by PCR NEGATIVE NEGATIVE Final   Influenza B by PCR NEGATIVE NEGATIVE Final    Comment: (NOTE) The Xpert Xpress SARS-CoV-2/FLU/RSV assay is intended as an aid in  the diagnosis of influenza from Nasopharyngeal swab specimens and  should not be used as a sole basis for treatment. Nasal washings and  aspirates are unacceptable for Xpert Xpress SARS-CoV-2/FLU/RSV  testing.  Fact Sheet for Patients: PinkCheek.be  Fact Sheet for Healthcare Providers: GravelBags.it  This test is not yet approved or cleared by the [place] FDA and  has been authorized for detection and/or diagnosis of SARS-CoV-2 by  FDA under an Emergency Use Authorization (EUA). This EUA will remain  in effect (meaning this test can be used) for the duration of the  Covid-19 declaration under Section 564(b)(1) of the Act, 21  U.S.C. section 360bbb-3(b)(1), unless the authorization is  terminated or revoked. Performed at [facility], [place] [address], [place], [place] [address]   SARS Coronavirus 2 by RT PCR (hospital order, performed in [facility] hospital lab) Nasopharyngeal Nasopharyngeal Swab     Status: None   Collection Time: 10/26/19  4:45 PM   Specimen: Nasopharyngeal Swab  Result Value Ref Range Status   SARS Coronavirus 2 NEGATIVE NEGATIVE Final    Comment: (NOTE) SARS-CoV-2 target nucleic acids are NOT DETECTED.  The SARS-CoV-2 RNA is generally detectable in upper and lower respiratory specimens during the acute phase of infection. The  lowest concentration of SARS-CoV-2 viral copies this assay can detect is 250 copies / mL. A negative result does not preclude SARS-CoV-2 infection and should not be used as the sole basis for treatment or other patient management decisions.  A negative result may occur with improper specimen collection / handling, submission of specimen other than nasopharyngeal swab, presence of viral mutation(s) within the areas targeted by this assay, and inadequate number of viral copies (<250 copies / mL). A negative result must be combined with clinical observations, patient history, and epidemiological information.  Fact Sheet for Patients:   StrictlyIdeas.no  Fact Sheet for Healthcare Providers: BankingDealers.co.za  This test is not yet approved or  cleared by the [place] FDA and has been authorized for detection and/or diagnosis of SARS-CoV-2 by FDA under an Emergency Use Authorization (EUA).  This EUA will remain in effect (meaning this test can be used) for the duration of the COVID-19 declaration under Section 564(b)(1) of the Act, 21 U.S.C. section 360bbb-3(b)(1), unless the authorization is terminated or revoked sooner.  Performed at [facility],  [place] [address] [place], [place] [address]          Radiology Studies: No results found.      Scheduled Meds: . famotidine  20 mg Oral Daily  . feeding supplement  237 mL Oral BID BM  . folic acid  1 mg Oral Daily  . gabapentin  200 mg Oral TID  . lactulose  20 g Oral BID  . nadolol  40 mg Oral Daily  . nystatin  5 mL Oral QID  . olopatadine  1 drop Both Eyes BID  . PARoxetine  40 mg Oral Daily  . rifaximin  550 mg Oral BID  . Sofosbuvir-Velpatasvir  1 tablet Oral Daily   Continuous Infusions:   LOS: 5 days   Time spent: 2mins Greater than 50% of this time was spent in counseling, explanation of diagnosis, planning of further management, and  coordination of care.  I reviewed all nursing notes, pharmacy notes, consultant notes,  vitals, pertinent old records  I have discussed plan of care as described above with RN , patient  on 10/27/2019  Voice Recognition /Dragon dictation system was used to create this note, attempts have been made to correct errors. Please contact the author with questions and/or clarifications.   Florencia Reasons, MD PhD FACP [facility]  Available via Epic secure chat 7am-7pm for nonurgent issues Please page for urgent issues To page the attending provider between 7A-7P or the covering provider during after hours 7P-7A, please log into the web site [email or web address] and access using universal [place] password for that web site. If you do not have the password, please call the hospital operator.    10/27/2019, 4:01 PM
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
## 2019-10-27 NOTE — TOC Progression Note (Addendum)
Transition of Care [facility]) - Progression Note    Patient Details  Name: Judy Meza MRN: [id number] Date of Birth: Jan 23, 1960  [facility] CM/SW Contact  Ross Ludwig, [place] Phone Number: 10/27/2019, 3:17 PM  Clinical Narrative:     CSW spoke to [name] the Education officer, museum at [employer] [phone], she was unable to find SNF placement for patient.  [name] stated that Claudia Desanctis will get caregivers set up to be at her home tomorrow after 1pm.  Per [name], she spoke to the home care team, and they are ready to accept patient at home tomorrow.  She said to call the Derenda Mis RN at [phone], once patient is ready for discharge.  Per [name], she spoke to patient's legal guardian and informed her of the situation.  Patient will be transitioning to [facility] in November, once her LTC Medicaid is approved.  CSW to update weekend [facility] worker about planned DC tomorrow back home.   Expected Discharge Plan: Skilled Nursing Facility Barriers to Discharge: Continued Medical Work up  Expected Discharge Plan and Services Expected Discharge Plan: [place]   Discharge Planning Services: CM Consult Post Acute Care Choice: [place]   Expected Discharge Date: 10/27/19               DME Arranged: N/A DME Agency: NA       HH Arranged: NA           Social Determinants of Health (SDOH) Interventions    Readmission Risk Interventions Readmission Risk Prevention Plan 10/05/2019 08/07/2019 06/06/2019  Transportation Screening Complete Complete Complete  HRI or Home Care Consult - Not Complete -  HRI or Home Care Consult comments - needs SNF -  Social Work Consult for [place] Planning/Counseling - Complete -  Palliative Care Screening - Not Applicable -  Medication Review Press photographer) Complete Complete Complete  PCP or Specialist appointment within 3-5 days of discharge Complete - -  [place] or Home Care Consult Complete - Complete  SW Recovery  Care/Counseling Consult Complete - Complete  Palliative Care Screening Not Applicable - Not [place] Not Applicable - Complete

## 2019-10-28 DIAGNOSIS — R627 Adult failure to thrive: Secondary | ICD-10-CM

## 2019-10-28 DIAGNOSIS — Z430 Encounter for attention to tracheostomy: Secondary | ICD-10-CM

## 2019-10-28 MED ORDER — SPIRONOLACTONE 100 MG PO TABS: 100.0000 mg | ORAL_TABLET | ORAL | Status: DC

## 2019-10-28 MED ORDER — FUROSEMIDE 20 MG PO TABS
40.0000 mg | ORAL_TABLET | ORAL | Status: DC
Start: 2019-10-30 — End: 2020-09-12

## 2019-10-28 NOTE — Progress Notes (Signed)
Discharged to home, instructions given to pt and sister Judy Meza. Acknowledge understanding. Spoke with Judy Meza sister guardian and [facility] [employer] Nurse. SRP, RN

## 2019-10-28 NOTE — Plan of Care (Signed)

## 2019-10-28 NOTE — Progress Notes (Signed)
Remo Lipps( son) in to pick up patient and transport home.  Med picked up from pharmacy and returned to pt.SRP, RN

## 2019-10-28 NOTE — Discharge Summary (Signed)
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 did not improve swallow enough to warrant usage; with larger amount of aspiration x1 - pt presented with reflexive cough.  Pt appears with possible edema of epiglottis.  CHL IP CERVICAL ESOPHAGEAL PHASE 10/25/2019 Cervical Esophageal Phase Impaired Pudding Teaspoon -- Pudding Cup -- Honey Teaspoon -- Honey Cup -- Nectar Teaspoon -- Nectar Cup Other (Comment) Nectar Straw Other (Comment) Thin Teaspoon Other (Comment) Thin Cup Other (Comment) Thin Straw Other (Comment) Puree Other (Comment) Mechanical Soft Other (Comment) Regular -- Multi-consistency -- Pill -- Cervical Esophageal Comment ? mild fibrosis/edema from prior radiation, mild retention with liquids more than solids [name], MS [facility] SLP Acute Rehab Services Office [phone] Pager [phone] [name] 10/25/2019, 3:37 PM              ECHOCARDIOGRAM COMPLETE  Result Date: 10/04/2019    ECHOCARDIOGRAM REPORT   Patient Name:   ALANTE WEIMANN Date of Exam: 10/04/2019 Medical Rec #:  [id number]     Height:       62.0 in  Accession #:    [id number]    Weight:       141.8 lb Date of Birth:  1960/09/09     BSA:          1.652 m Patient Age:    71 years      BP:           124/90 mmHg Patient Gender: F             HR:           89 bpm. Exam Location:  Inpatient Procedure: 2D Echo, Cardiac Doppler and Color Doppler                                MODIFIED REPORT:   This report was modified by Gwyndolyn Kaufman MD on 10/04/2019 due to Prior                                     study.  Indications:     Syncope  History:         Patient has prior history of Echocardiogram examinations, most  recent 06/05/2019. Signs/Symptoms:Syncope; Risk Factors:Former                  Smoker. Hep. C, alcohol abuse, CKD, dementia, substance abuse.  Sonographer:     Dustin Flock Referring Phys:  [id number] Oda Kilts Diagnosing Phys: Gwyndolyn Kaufman MD IMPRESSIONS  1. Left ventricular ejection fraction, by estimation, is 55 to 60%. The left ventricle has normal function. The left ventricle has no regional wall motion abnormalities. Left ventricular diastolic parameters are consistent with Grade II diastolic dysfunction (pseudonormalization).  2. Right ventricular systolic function is normal. The right ventricular size is normal. There is mildly elevated pulmonary artery systolic pressure.  3. Left atrial size was moderately dilated.  4. The mitral valve is normal in structure. Trivial mitral valve regurgitation.  5. The aortic valve is normal in structure. Aortic valve regurgitation is not visualized.  6. The inferior vena cava is normal in size with greater than 50% respiratory variability, suggesting right atrial pressure of 3 mmHg. Comparison(s): No significant change from prior study. FINDINGS  Left Ventricle: Left ventricular ejection fraction, by estimation, is 55 to 60%. The left ventricle has normal function. The left ventricle has no regional wall motion abnormalities. The left ventricular internal cavity size was normal in  size. There is  no left ventricular hypertrophy. Left ventricular diastolic parameters are consistent with Grade II diastolic dysfunction (pseudonormalization). Right Ventricle: The right ventricular size is normal. No increase in right ventricular wall thickness. Right ventricular systolic function is normal. There is mildly elevated pulmonary artery systolic pressure. The tricuspid regurgitant velocity is 2.92  m/s, and with an assumed right atrial pressure of 3 mmHg, the estimated right ventricular systolic pressure is 18.2 mmHg. Left Atrium: Left atrial size was moderately dilated. Right Atrium: Right atrial size was normal in size. Pericardium: There is no evidence of pericardial effusion. Mitral Valve: The mitral valve is normal in structure. There is mild thickening of the mitral valve leaflet(s). Mild mitral annular calcification. Trivial mitral valve regurgitation. Tricuspid Valve: Estimated PASP 54mmHg +RAP. The tricuspid valve is normal in structure. Tricuspid valve regurgitation is mild. Aortic Valve: The aortic valve is normal in structure. Aortic valve regurgitation is not visualized. Pulmonic Valve: The pulmonic valve was normal in structure. Pulmonic valve regurgitation is not visualized. Aorta: The aortic root is normal in size and structure. Venous: The inferior vena cava is normal in size with greater than 50% respiratory variability, suggesting right atrial pressure of 3 mmHg. IAS/Shunts: The atrial septum is grossly normal.  LEFT VENTRICLE PLAX 2D LVIDd:         3.70 cm  Diastology LVIDs:         2.95 cm  LV e' medial:    5.98 cm/s LV PW:         1.00 cm  LV E/e' medial:  14.8 LV IVS:        1.20 cm  LV e' lateral:   7.72 cm/s LVOT diam:     1.90 cm  LV E/e' lateral: 11.5 LV SV:         77 LV SV Index:   46 LVOT Area:     2.84 cm  RIGHT VENTRICLE RV Basal diam:  2.50 cm RV S prime:     7.72 cm/s TAPSE (M-mode): 3.0 cm LEFT ATRIUM             Index       RIGHT ATRIUM  Index LA diam:         3.30 cm 2.00 cm/m  RA Area:     10.10 cm LA Vol (A2C):   38.3 ml 23.19 ml/m RA Volume:   19.60 ml  11.87 ml/m LA Vol (A4C):   43.0 ml 26.03 ml/m LA Biplane Vol: 40.5 ml 24.52 ml/m  AORTIC VALVE LVOT Vmax:   109.00 cm/s LVOT Vmean:  78.100 cm/s LVOT VTI:    0.270 m  AORTA Ao Root diam: 2.50 cm MITRAL VALVE                TRICUSPID VALVE MV Area (PHT): 7.16 cm     TR Peak grad:   34.1 mmHg MV Decel Time: 106 msec     TR Vmax:        292.00 cm/s MV E velocity: 88.80 cm/s MV A velocity: 127.00 cm/s  SHUNTS MV E/A ratio:  0.70         Systemic VTI:  0.27 m                             Systemic Diam: 1.90 cm Gwyndolyn Kaufman MD Electronically signed by Gwyndolyn Kaufman MD Signature Date/Time: 10/04/2019/12:15:57 PM    Final (Updated)    [place] ASCITES (ABDOMEN LIMITED)  Result Date: 10/03/2019 CLINICAL DATA:  Evaluate for ascites. EXAM: LIMITED ABDOMEN ULTRASOUND FOR ASCITES TECHNIQUE: Limited ultrasound survey for ascites was performed in all four abdominal quadrants. COMPARISON:  August 04, 2019 FINDINGS: No free fluid is seen within the 4 abdominal quadrants. IMPRESSION: No evidence of ascites. Electronically Signed   By: Virgina Norfolk M.D.   On: 10/03/2019 22:29   VAS [place] LOWER EXTREMITY VENOUS (DVT) (ONLY [facility] & [facility] 7a-7p)  Result Date: 10/03/2019  Lower Venous DVTStudy Indications: Swelling bilateral RT>LT.  Risk Factors: Trauma S/P fall. Comparison Study: No prior studies. Performing Technologist: Darlin Coco  Examination Guidelines: A complete evaluation includes B-mode imaging, spectral Doppler, color Doppler, and power Doppler as needed of all accessible portions of each vessel. Bilateral testing is considered an integral part of a complete examination. Limited examinations for reoccurring indications may be performed as noted. The reflux portion of the exam is performed with the patient in reverse Trendelenburg.  +---------+---------------+---------+-----------+----------+--------------+ RIGHT     CompressibilityPhasicitySpontaneityPropertiesThrombus Aging +---------+---------------+---------+-----------+----------+--------------+ CFV      Full           Yes      Yes                                 +---------+---------------+---------+-----------+----------+--------------+ SFJ      Full                                                        +---------+---------------+---------+-----------+----------+--------------+ FV Prox  Full                                                        +---------+---------------+---------+-----------+----------+--------------+ FV Mid   Full                                                        +---------+---------------+---------+-----------+----------+--------------+  FV DistalFull                                                        +---------+---------------+---------+-----------+----------+--------------+ PFV      Full                                                        +---------+---------------+---------+-----------+----------+--------------+ POP      Full           Yes      Yes                                 +---------+---------------+---------+-----------+----------+--------------+ PTV      Full                                                        +---------+---------------+---------+-----------+----------+--------------+ PERO     Full                                                        +---------+---------------+---------+-----------+----------+--------------+   +---------+---------------+---------+-----------+---------------+-------------+ LEFT     CompressibilityPhasicitySpontaneityProperties     Thrombus                                                                 Aging         +---------+---------------+---------+-----------+---------------+-------------+ CFV      Full           No       Yes        Abnormal                                                                  waveform                     +---------+---------------+---------+-----------+---------------+-------------+ SFJ      Full                                                            +---------+---------------+---------+-----------+---------------+-------------+ FV Prox  Full                                                            +---------+---------------+---------+-----------+---------------+-------------+  FV Mid   Full                                                            +---------+---------------+---------+-----------+---------------+-------------+ FV DistalFull                                                            +---------+---------------+---------+-----------+---------------+-------------+ PFV      Full                                                            +---------+---------------+---------+-----------+---------------+-------------+ POP      Full           Yes      Yes                                     +---------+---------------+---------+-----------+---------------+-------------+ PTV      Full                                                            +---------+---------------+---------+-----------+---------------+-------------+ PERO     Full                                                            +---------+---------------+---------+-----------+---------------+-------------+     Summary: RIGHT: - There is no evidence of deep vein thrombosis in the lower extremity.  - No cystic structure found in the popliteal fossa.  LEFT: - There is no evidence of deep vein thrombosis in the lower extremity.  - No cystic structure found in the popliteal fossa.  *See table(s) above for measurements and observations. Electronically signed by Deitra Mayo MD on 10/03/2019 at 4:45:36 PM.    Final     Microbiology: Recent Results (from the past 240 hour(s))  Respiratory Panel by RT PCR (Flu A&B, Covid) - Nasopharyngeal  Swab     Status: None   Collection Time: 10/22/19  9:56 PM   Specimen: Nasopharyngeal Swab  Result Value Ref Range Status   SARS Coronavirus 2 by RT PCR NEGATIVE NEGATIVE Final    Comment: (NOTE) SARS-CoV-2 target nucleic acids are NOT DETECTED.  The SARS-CoV-2 RNA is generally detectable in upper respiratoy specimens during the acute phase of infection. The lowest concentration of SARS-CoV-2 viral copies this assay can detect is 131 copies/mL. A negative result does not preclude SARS-Cov-2 infection and should not be used as the sole basis for treatment or other patient management decisions. A negative result may occur with  improper specimen collection/handling, submission of specimen other than nasopharyngeal swab, presence of viral mutation(s) within the areas targeted by this assay, and inadequate number of viral copies (<131 copies/mL). A negative result must be combined with clinical observations, patient history, and epidemiological information. The expected result is Negative.  Fact Sheet for Patients:  PinkCheek.be  Fact Sheet for Healthcare Providers:  GravelBags.it  This test is no t yet approved or cleared by the [place] FDA and  has been authorized for detection and/or diagnosis of SARS-CoV-2 by FDA under an Emergency Use Authorization (EUA). This EUA will remain  in effect (meaning this test can be used) for the duration of the COVID-19 declaration under Section 564(b)(1) of the Act, 21 U.S.C. section 360bbb-3(b)(1), unless the authorization is terminated or revoked sooner.     Influenza A by PCR NEGATIVE NEGATIVE Final   Influenza B by PCR NEGATIVE NEGATIVE Final    Comment: (NOTE) The Xpert Xpress SARS-CoV-2/FLU/RSV assay is intended as an aid in  the diagnosis of influenza from Nasopharyngeal swab specimens and  should not be used as a sole basis for treatment. Nasal washings and  aspirates are  unacceptable for Xpert Xpress SARS-CoV-2/FLU/RSV  testing.  Fact Sheet for Patients: PinkCheek.be  Fact Sheet for Healthcare Providers: GravelBags.it  This test is not yet approved or cleared by the [place] FDA and  has been authorized for detection and/or diagnosis of SARS-CoV-2 by  FDA under an Emergency Use Authorization (EUA). This EUA will remain  in effect (meaning this test can be used) for the duration of the  Covid-19 declaration under Section 564(b)(1) of the Act, 21  U.S.C. section 360bbb-3(b)(1), unless the authorization is  terminated or revoked. Performed at [facility], [place] [address], [place], [place] [address]   SARS Coronavirus 2 by RT PCR (hospital order, performed in [facility] hospital lab) Nasopharyngeal Nasopharyngeal Swab     Status: None   Collection Time: 10/26/19  4:45 PM   Specimen: Nasopharyngeal Swab  Result Value Ref Range Status   SARS Coronavirus 2 NEGATIVE NEGATIVE Final    Comment: (NOTE) SARS-CoV-2 target nucleic acids are NOT DETECTED.  The SARS-CoV-2 RNA is generally detectable in upper and lower respiratory specimens during the acute phase of infection. The lowest concentration of SARS-CoV-2 viral copies this assay can detect is 250 copies / mL. A negative result does not preclude SARS-CoV-2 infection and should not be used as the sole basis for treatment or other patient management decisions.  A negative result may occur with improper specimen collection / handling, submission of specimen other than nasopharyngeal swab, presence of viral mutation(s) within the areas targeted by this assay, and inadequate number of viral copies (<250 copies / mL). A negative result must be combined with clinical observations, patient history, and epidemiological information.  Fact Sheet for Patients:   StrictlyIdeas.no  Fact Sheet for Healthcare  Providers: BankingDealers.co.za  This test is not yet approved or  cleared by the [place] FDA and has been authorized for detection and/or diagnosis of SARS-CoV-2 by FDA under an Emergency Use Authorization (EUA).  This EUA will remain in effect (meaning this test can be used) for the duration of the COVID-19 declaration under Section 564(b)(1) of the Act, 21 U.S.C. section 360bbb-3(b)(1), unless the authorization is terminated or revoked sooner.  Performed at [facility], [place] [address], [place], [place] [address]      Labs: Basic Metabolic Panel: Recent Labs  Lab 10/22/19 2000 10/23/19  8182 10/25/19 0934  NA 136 136 140  K 4.3 5.0 4.3  CL 100 104 106  CO2 22 22 20*  GLUCOSE 91 149* 101*  BUN 44* 37* 42*  CREATININE 3.22* 2.46* 2.70*  CALCIUM 9.7 8.9 9.5  MG  --   --  2.4   Liver Function Tests: Recent Labs  Lab 10/22/19 2000 10/23/19 0428 10/25/19 0934  AST 52* 56* 49*  ALT 23 20 22   ALKPHOS 81 75 85  BILITOT 2.3* 1.6* 1.4*  PROT 8.7* 8.1 8.1  ALBUMIN 3.7 3.4* 3.4*   Recent Labs  Lab 10/23/19 0428  LIPASE 55*   Recent Labs  Lab 10/22/19 2001  AMMONIA 71*   CBC: Recent Labs  Lab 10/22/19 2000 10/23/19 0428 10/25/19 0934  WBC 4.7 4.8 6.8  NEUTROABS 2.9  --  4.5  HGB 10.7* 10.8* 11.5*  HCT 32.9* 33.6* 37.0  MCV 93.5 93.9 97.4  PLT 100* 89* 89*   Cardiac Enzymes: No results for input(s): CKTOTAL, CKMB, CKMBINDEX, TROPONINI in the last 168 hours. BNP: BNP (last 3 results) Recent Labs    04/30/19 0553 10/03/19 1459  BNP 21.4 67.1    ProBNP (last 3 results) No results for input(s): PROBNP in the last 8760 hours.  CBG: No results for input(s): GLUCAP in the last 168 hours.     Signed:  Florencia Reasons MD, PhD, FACP  [facility] 10/28/2019, 9:35 AM

## 2019-10-31 ENCOUNTER — Ambulatory Visit: Payer: Medicaid Other | Admitting: Gastroenterology

## 2019-11-01 ENCOUNTER — Ambulatory Visit: Payer: Medicaid Other | Admitting: Physician Assistant

## 2019-11-11 ENCOUNTER — Emergency Department (HOSPITAL_COMMUNITY): Payer: Medicaid Other

## 2019-11-11 ENCOUNTER — Inpatient Hospital Stay (HOSPITAL_COMMUNITY)
Admission: EM | Admit: 2019-11-11 | Discharge: 2019-11-13 | DRG: 433 | Disposition: A | Discharge status: Skilled Nursing Facility | Payer: Medicaid Other | Attending: Internal Medicine | Admitting: Internal Medicine

## 2019-11-11 ENCOUNTER — Other Ambulatory Visit: Payer: Self-pay

## 2019-11-11 DIAGNOSIS — Z66 Do not resuscitate: Secondary | ICD-10-CM | POA: Diagnosis present

## 2019-11-11 DIAGNOSIS — B182 Chronic viral hepatitis C: Secondary | ICD-10-CM | POA: Diagnosis not present

## 2019-11-11 DIAGNOSIS — Z79899 Other long term (current) drug therapy: Secondary | ICD-10-CM

## 2019-11-11 DIAGNOSIS — N179 Acute kidney failure, unspecified: Secondary | ICD-10-CM | POA: Diagnosis present

## 2019-11-11 DIAGNOSIS — Z20822 Contact with and (suspected) exposure to covid-19: Secondary | ICD-10-CM | POA: Diagnosis present

## 2019-11-11 DIAGNOSIS — K7290 Hepatic failure, unspecified without coma: Principal | ICD-10-CM

## 2019-11-11 DIAGNOSIS — K7031 Alcoholic cirrhosis of liver with ascites: Secondary | ICD-10-CM | POA: Diagnosis not present

## 2019-11-11 DIAGNOSIS — K7200 Acute and subacute hepatic failure without coma: Secondary | ICD-10-CM

## 2019-11-11 DIAGNOSIS — N184 Chronic kidney disease, stage 4 (severe): Secondary | ICD-10-CM | POA: Diagnosis present

## 2019-11-11 DIAGNOSIS — Z8249 Family history of ischemic heart disease and other diseases of the circulatory system: Secondary | ICD-10-CM

## 2019-11-11 DIAGNOSIS — F0390 Unspecified dementia without behavioral disturbance: Secondary | ICD-10-CM | POA: Diagnosis present

## 2019-11-11 DIAGNOSIS — D61818 Other pancytopenia: Secondary | ICD-10-CM | POA: Diagnosis present

## 2019-11-11 DIAGNOSIS — N183 Chronic kidney disease, stage 3 unspecified: Secondary | ICD-10-CM | POA: Diagnosis present

## 2019-11-11 DIAGNOSIS — I129 Hypertensive chronic kidney disease with stage 1 through stage 4 chronic kidney disease, or unspecified chronic kidney disease: Secondary | ICD-10-CM | POA: Diagnosis present

## 2019-11-11 DIAGNOSIS — Z8521 Personal history of malignant neoplasm of larynx: Secondary | ICD-10-CM

## 2019-11-11 DIAGNOSIS — N1832 Chronic kidney disease, stage 3b: Secondary | ICD-10-CM | POA: Diagnosis present

## 2019-11-11 DIAGNOSIS — Z930 Tracheostomy status: Secondary | ICD-10-CM

## 2019-11-11 DIAGNOSIS — J449 Chronic obstructive pulmonary disease, unspecified: Secondary | ICD-10-CM | POA: Diagnosis present

## 2019-11-11 DIAGNOSIS — K7040 Alcoholic hepatic failure without coma: Principal | ICD-10-CM | POA: Diagnosis present

## 2019-11-11 DIAGNOSIS — K7682 Hepatic encephalopathy: Secondary | ICD-10-CM | POA: Diagnosis present

## 2019-11-11 DIAGNOSIS — F1010 Alcohol abuse, uncomplicated: Secondary | ICD-10-CM | POA: Diagnosis present

## 2019-11-11 DIAGNOSIS — D696 Thrombocytopenia, unspecified: Secondary | ICD-10-CM | POA: Diagnosis present

## 2019-11-11 DIAGNOSIS — K7210 Chronic hepatic failure without coma: Secondary | ICD-10-CM | POA: Diagnosis present

## 2019-11-11 DIAGNOSIS — I878 Other specified disorders of veins: Secondary | ICD-10-CM | POA: Diagnosis present

## 2019-11-11 DIAGNOSIS — Z87891 Personal history of nicotine dependence: Secondary | ICD-10-CM

## 2019-11-11 LAB — COMPREHENSIVE METABOLIC PANEL
ALT: 21 U/L (ref 0–44)
AST: 40 U/L (ref 15–41)
Albumin: 3.1 g/dL — ABNORMAL LOW (ref 3.5–5.0)
Alkaline Phosphatase: 60 U/L (ref 38–126)
Anion gap: 10 (ref 5–15)
BUN: 45 mg/dL — ABNORMAL HIGH (ref 6–20)
CO2: 20 mmol/L — ABNORMAL LOW (ref 22–32)
Calcium: 9.2 mg/dL (ref 8.9–10.3)
Chloride: 108 mmol/L (ref 98–111)
Creatinine, Ser: 2.57 mg/dL — ABNORMAL HIGH (ref 0.44–1.00)
GFR, Estimated: 21 mL/min — ABNORMAL LOW (ref >60)
Glucose, Bld: 99 mg/dL (ref 70–99)
Potassium: 4.7 mmol/L (ref 3.5–5.1)
Sodium: 138 mmol/L (ref 135–145)
Total Bilirubin: 1.2 mg/dL (ref 0.3–1.2)
Total Protein: 7.3 g/dL (ref 6.5–8.1)

## 2019-11-11 LAB — CBC WITH DIFFERENTIAL/PLATELET
Abs Immature Granulocytes: 0.01 10*3/uL (ref 0.00–0.07)
Basophils Absolute: 0 10*3/uL (ref 0.0–0.1)
Basophils Relative: 0 %
Eosinophils Absolute: 0 10*3/uL (ref 0.0–0.5)
Eosinophils Relative: 1 %
HCT: 32 % — ABNORMAL LOW (ref 36.0–46.0)
Hemoglobin: 10.2 g/dL — ABNORMAL LOW (ref 12.0–15.0)
Immature Granulocytes: 0 %
Lymphocytes Relative: 34 %
Lymphs Abs: 0.9 10*3/uL (ref 0.7–4.0)
MCH: 30.4 pg (ref 26.0–34.0)
MCHC: 31.9 g/dL (ref 30.0–36.0)
MCV: 95.2 fL (ref 80.0–100.0)
Monocytes Absolute: 0.5 10*3/uL (ref 0.1–1.0)
Monocytes Relative: 17 %
Neutro Abs: 1.3 10*3/uL — ABNORMAL LOW (ref 1.7–7.7)
Neutrophils Relative %: 48 %
Platelets: 73 10*3/uL — ABNORMAL LOW (ref 150–400)
RBC: 3.36 MIL/uL — ABNORMAL LOW (ref 3.87–5.11)
RDW: 18.9 % — ABNORMAL HIGH (ref 11.5–15.5)
WBC: 2.7 10*3/uL — ABNORMAL LOW (ref 4.0–10.5)
nRBC: 0 % (ref 0.0–0.2)

## 2019-11-11 LAB — RESPIRATORY PANEL BY RT PCR (FLU A&B, COVID)
Influenza A by PCR: NEGATIVE
Influenza B by PCR: NEGATIVE
SARS Coronavirus 2 by RT PCR: NEGATIVE

## 2019-11-11 LAB — ETHANOL: Alcohol, Ethyl (B): <10 mg/dL (ref <10)

## 2019-11-11 LAB — URINALYSIS, ROUTINE W REFLEX MICROSCOPIC
Bilirubin Urine: NEGATIVE
Glucose, UA: NEGATIVE mg/dL
Hgb urine dipstick: NEGATIVE
Ketones, ur: NEGATIVE mg/dL
Leukocytes,Ua: NEGATIVE
Nitrite: NEGATIVE
Protein, ur: NEGATIVE mg/dL
Specific Gravity, Urine: 1.017 (ref 1.005–1.030)
pH: 7 (ref 5.0–8.0)

## 2019-11-11 LAB — AMMONIA: Ammonia: 152 umol/L — ABNORMAL HIGH (ref 9–35)

## 2019-11-11 MED ORDER — FOLIC ACID 1 MG PO TABS
1.0000 mg | ORAL_TABLET | Freq: Every day | ORAL | Status: DC
  Administered 2019-11-12 – 2019-11-13 (×2): 1 mg via ORAL
  Filled 2019-11-11 (×2): qty 1

## 2019-11-11 MED ORDER — RIFAXIMIN 550 MG PO TABS
550.0000 mg | ORAL_TABLET | Freq: Two times a day (BID) | ORAL | Status: DC
  Administered 2019-11-12 – 2019-11-13 (×3): 550 mg via ORAL
  Filled 2019-11-11 (×4): qty 1

## 2019-11-11 MED ORDER — PAROXETINE HCL 20 MG PO TABS
40.0000 mg | ORAL_TABLET | Freq: Every day | ORAL | Status: DC
  Administered 2019-11-12 – 2019-11-13 (×2): 40 mg via ORAL
  Filled 2019-11-11 (×2): qty 2

## 2019-11-11 MED ORDER — LACTULOSE ENEMA
300.0000 mL | Freq: Once | ORAL | Status: AC
  Administered 2019-11-11: 300 mL via RECTAL
  Filled 2019-11-11 (×2): qty 300

## 2019-11-11 MED ORDER — HYDROXYZINE HCL 25 MG PO TABS
25.0000 mg | ORAL_TABLET | Freq: Four times a day (QID) | ORAL | Status: DC | PRN
  Administered 2019-11-12 – 2019-11-13 (×3): 25 mg via ORAL
  Filled 2019-11-11 (×3): qty 1

## 2019-11-11 MED ORDER — LACTULOSE 10 GM/15ML PO SOLN
20.0000 g | Freq: Three times a day (TID) | ORAL | Status: DC
  Administered 2019-11-12 – 2019-11-13 (×4): 20 g via ORAL
  Filled 2019-11-11 (×4): qty 30

## 2019-11-11 MED ORDER — FAMOTIDINE 20 MG PO TABS
20.0000 mg | ORAL_TABLET | Freq: Every day | ORAL | Status: DC
  Administered 2019-11-12 – 2019-11-13 (×2): 20 mg via ORAL
  Filled 2019-11-11 (×2): qty 1

## 2019-11-11 MED ORDER — THIAMINE HCL 100 MG PO TABS
100.0000 mg | ORAL_TABLET | Freq: Every day | ORAL | Status: DC
  Administered 2019-11-12 – 2019-11-13 (×2): 100 mg via ORAL
  Filled 2019-11-11 (×2): qty 1

## 2019-11-11 MED ORDER — OLOPATADINE HCL 0.1 % OP SOLN
1.0000 [drp] | Freq: Every day | OPHTHALMIC | Status: DC | PRN
  Filled 2019-11-11: qty 5

## 2019-11-11 MED ORDER — LORATADINE 10 MG PO TABS
10.0000 mg | ORAL_TABLET | Freq: Every day | ORAL | Status: DC
  Administered 2019-11-12 – 2019-11-13 (×2): 10 mg via ORAL
  Filled 2019-11-11 (×2): qty 1

## 2019-11-11 MED ORDER — LACTULOSE 10 GM/15ML PO SOLN: 30.0000 g | Freq: Once | ORAL | Status: DC

## 2019-11-11 MED ORDER — NADOLOL 40 MG PO TABS
40.0000 mg | ORAL_TABLET | Freq: Every day | ORAL | Status: DC
  Filled 2019-11-11 (×2): qty 1

## 2019-11-11 MED ORDER — GABAPENTIN 100 MG PO CAPS
200.0000 mg | ORAL_CAPSULE | Freq: Three times a day (TID) | ORAL | Status: DC
  Administered 2019-11-12 – 2019-11-13 (×4): 200 mg via ORAL
  Filled 2019-11-11 (×4): qty 2

## 2019-11-11 MED ORDER — SOFOSBUVIR-VELPATASVIR 400-100 MG PO TABS: 1.0000 | ORAL_TABLET | Freq: Every day | ORAL | Status: DC

## 2019-11-11 MED ORDER — SODIUM BICARBONATE 650 MG PO TABS
650.0000 mg | ORAL_TABLET | Freq: Two times a day (BID) | ORAL | Status: DC
  Administered 2019-11-12 – 2019-11-13 (×3): 650 mg via ORAL
  Filled 2019-11-11 (×4): qty 1

## 2019-11-11 MED ORDER — RIFAXIMIN 550 MG PO TABS
550.0000 mg | ORAL_TABLET | Freq: Once | ORAL | Status: DC
  Filled 2019-11-11: qty 1

## 2019-11-11 MED ORDER — ALBUTEROL SULFATE (2.5 MG/3ML) 0.083% IN NEBU: 2.5000 mg | INHALATION_SOLUTION | Freq: Four times a day (QID) | RESPIRATORY_TRACT | Status: DC | PRN

## 2019-11-11 MED ORDER — SODIUM CHLORIDE 0.9 % IV BOLUS
1000.0000 mL | Freq: Once | INTRAVENOUS | Status: AC
  Administered 2019-11-11: 1000 mL via INTRAVENOUS

## 2019-11-11 MED ORDER — ENSURE ENLIVE PO LIQD
237.0000 mL | Freq: Two times a day (BID) | ORAL | Status: DC
  Administered 2019-11-12 – 2019-11-13 (×3): 237 mL via ORAL

## 2019-11-11 NOTE — ED Triage Notes (Signed)
[facility] transferred pt from home to [facility] ED and reports the following:  Last night, pt went to bed at 9PM at her baseline. Typically, she wakes up at [facility]. Today, family woke pt up at [facility]. They report she was lethargic and it has gotten worse throughout the day. 12-lead NSR. Alert to verbal with [facility] of 12.

## 2019-11-11 NOTE — Progress Notes (Signed)
Unable to obtain pts admission history at this time. Pt not following commands or answering any questions.

## 2019-11-11 NOTE — ED Notes (Signed)
Patient transported to CT 

## 2019-11-11 NOTE — ED Provider Notes (Signed)
[facility] DEPT Provider Note   CSN: [id number] Arrival date & time: 11/11/19  1618     History Chief Complaint  Patient presents with  . Altered Mental Status    Judy Meza is a 59 y.o. female history of hypertension, alcohol cirrhosis, hepatic encephalopathy here presenting altered mental status.  Patient apparently woke up today and was noted to be altered and confused.  Patient lives at home with her sister.  Patient was home for the last 2 weeks.  Patient has been progressively more confused and altered for the last week or so.  Sister states that she has to force her to eat and take her medicines.  Patient did not miss any doses of her lactulose.  Patient was unable to give me much history at all.  History as per family.  Apparently was admitted for similar symptoms and was diagnosed with hepatic encephalopathy.    The history is provided by the EMS personnel.       Past Medical History:  Diagnosis Date  . Cancer ([place])   . COPD (chronic obstructive pulmonary disease) ([place])   . 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 Hypertension   . Liver disease   . Renal disorder     Patient Active Problem List   Diagnosis Date Noted  . Secondary esophageal varices with bleeding ([place])   . Esophageal stricture   . Cirrhosis ([place])   . Decompensated hepatic cirrhosis ([place]) 10/03/2019  . Acute on chronic anemia 10/03/2019  . Cancer screening 08/14/2019  . Back pain with radiculopathy 08/07/2019  . Recurrent falls 08/04/2019  . Multiple falls at home 08/03/2019  . 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 Goals of care, counseling/discussion 07/28/2019  . Acute encephalopathy 06/05/2019  . Chest pain 06/04/2019  . QT prolongation 06/04/2019  . Metabolic acidosis 34/19/6222  . Dementia without behavioral disturbance ([place]) 05/10/2019  . Chronic hepatitis C without hepatic coma ([place]) 05/10/2019  . Hepatic encephalopathy ([place]) 05/09/2019   . Pancytopenia, acquired ([place])   . Cirrhosis of liver ([place])   . CKD (chronic kidney disease), stage III ([place]) 04/29/2019  . COPD with chronic bronchitis ([place]) 04/29/2019  . Tobacco abuse 04/29/2019  . Alcoholism in remission ([place]) 04/29/2019  . Acute hepatic encephalopathy 04/29/2019  . AKI (acute kidney injury) ([place]) 04/29/2019  . Alcoholic cirrhosis of liver with ascites ([place]) 04/29/2019  . Thrombocytopenia ([place]) 04/29/2019  . Hyperkalemia 04/29/2019    Past Surgical History:  Procedure Laterality Date  . ESOPHAGEAL BANDING  10/04/2019   Procedure: ESOPHAGEAL BANDING;  Surgeon: Lavena Bullion, DO;  Location: [facility];  Service: Gastroenterology;;  . ESOPHAGOGASTRODUODENOSCOPY (EGD) WITH PROPOFOL N/A 10/04/2019   Procedure: ESOPHAGOGASTRODUODENOSCOPY (EGD) WITH PROPOFOL;  Surgeon: Lavena Bullion, DO;  Location: [facility];  Service: Gastroenterology;  Laterality: N/A;  . HOT HEMOSTASIS N/A 10/04/2019   Procedure: HOT HEMOSTASIS (ARGON PLASMA COAGULATION/BICAP);  Surgeon: Lavena Bullion, DO;  Location: [facility] END[facility]COPY;  Service: Gastroenterology;  Laterality: N/A;  . PORT-A-CATH REMOVAL       OB History    Gravida  2   Para      Term      Preterm      AB      Living  1     SAB      TAB      Ectopic      Multiple      Live Births  Family History  Problem Relation Age of Onset  . Healthy Mother   . Hypertension Other     Social History   Tobacco Use  . Smoking status: Former Smoker    Packs/day: 0.25    Types: Cigarettes    Quit date: 04/03/2019    Years since quitting: 0.6  . Smokeless tobacco: Never Used  Vaping Use  . Vaping Use: Never used  Substance Use Topics  . Alcohol use: Not Currently  . Drug use: Not Currently    Home Medications Prior to Admission medications   Medication Sig Start Date End Date Taking? Authorizing Provider  albuterol (PROVENTIL) (2.5 MG/3ML) 0.083% nebulizer solution Take 3 mLs (2.5 mg  total) by nebulization every 6 (six) hours as needed for wheezing or shortness of breath. 06/01/19   Vevelyn Francois, NP  Cholecalciferol (VITAMIN D3) 125 MCG (5000 UT) CAPS Take 1 capsule by mouth once a week.    [provider]  famotidine (PEPCID) 20 MG tablet Take 20 mg by mouth daily.    [provider]  feeding supplement, ENSURE ENLIVE, (ENSURE ENLIVE) LIQD Take 237 mLs by mouth 2 (two) times daily between meals. 05/02/19   Eugenie Filler, MD  folic acid (FOLVITE) 1 MG tablet Take 1 tablet (1 mg total) by mouth daily. 10/09/19   Andrew Au, MD  furosemide (LASIX) 20 MG tablet Take 2 tablets (40 mg total) by mouth every Monday, Wednesday, and Friday. 10/30/19   Florencia Reasons, MD  gabapentin (NEURONTIN) 100 MG capsule Take 200 mg by mouth 3 (three) times daily.    [provider]  hydrOXYzine (ATARAX/VISTARIL) 25 MG tablet Take 25 mg by mouth every 6 (six) hours as needed for anxiety or itching.    [provider]  lactulose (CHRONULAC) 10 GM/15ML solution Take 30 mLs (20 g total) by mouth 3 (three) times daily. 06/01/19   Vevelyn Francois, NP  loratadine (CLARITIN) 10 MG tablet Take 1 tablet (10 mg total) by mouth daily. 06/01/19 10/22/19  Vevelyn Francois, NP  nadolol (CORGARD) 40 MG tablet Take 1 tablet (40 mg total) by mouth daily. 10/09/19   Andrew Au, MD  nystatin (MYCOSTATIN) 100000 UNIT/ML suspension Take 4 mLs by mouth in the morning, at noon, in the evening, and at bedtime. X 14 days Patient not taking: Reported on 10/22/2019 09/26/19   [provider]  Olopatadine HCl (PATADAY) 0.2 % SOLN Place 1 drop into both eyes daily as needed (allergic conjunctivitis).    [provider]  PARoxetine (PAXIL) 40 MG tablet Take 1 tablet (40 mg total) by mouth every morning. 06/01/19 10/22/19  Vevelyn Francois, NP  rifaximin (XIFAXAN) 550 MG TABS tablet Take 1 tablet (550 mg total) by mouth 2 (two) times daily. 06/09/19   Vevelyn Francois, NP  Skin  Protectants, Misc. (EUCERIN) cream Apply 1 application topically as needed for dry skin (and itching).    [provider]  sodium bicarbonate 650 MG tablet Take 1 tablet (650 mg total) by mouth 2 (two) times daily. 08/09/19   Hosie Poisson, MD  Sofosbuvir-Velpatasvir (EPCLUSA) 400-100 MG TABS Take 1 tablet by mouth daily. 07/12/19   Kuppelweiser, Cassie L, RPH-CPP  spironolactone (ALDACTONE) 100 MG tablet Take 1 tablet (100 mg total) by mouth every Monday, Wednesday, and Friday. 10/30/19   Florencia Reasons, MD  SYSTANE ULTRA 0.4-0.3 % SOLN Place 1 drop into both eyes 2 (two) times daily as needed (dry eyes).  Patient not  taking: Reported on 11/07/2019 09/28/19   [provider]  thiamine 100 MG tablet Take 1 tablet (100 mg total) by mouth daily. 06/08/19   Mendel Corning, MD    Allergies    Patient has no known allergies.  Review of Systems   Review of Systems  Unable to perform ROS: Mental status change  All other systems reviewed and are negative.   Physical Exam Updated Vital Signs BP (!) 146/98   Pulse 64   Temp 97.8 F (36.6 C) (Rectal)   Resp 13   SpO2 100%   Physical Exam Vitals and nursing note reviewed.  Constitutional:      Comments: Altered, confused   HENT:     Head: Atraumatic.     Comments: No obvious scalp hematoma     Nose: Nose normal.     Mouth/Throat:     Mouth: Mucous membranes are dry.  Eyes:     Comments: Scleral icterus   Neck:     Comments: Trach stoma in place, trach was removed  Cardiovascular:     Rate and Rhythm: Normal rate and regular rhythm.     Pulses: Normal pulses.     Heart sounds: Normal heart sounds.  Pulmonary:     Effort: Pulmonary effort is normal.     Breath sounds: Normal breath sounds.  Abdominal:     General: Abdomen is flat.     Palpations: Abdomen is soft.     Comments: Distended, nontender   Musculoskeletal:        General: Normal range of motion.     Cervical back: Normal range of motion.  Skin:    General:  Skin is warm.  Neurological:     Comments: Sleepy unable to answer questions.  Patient is arousable to pain but nonverbal.  Unable to assess for asterixis but patient does have spontaneous movements bilaterally.   Psychiatric:     Comments: Unable      ED Results / Procedures / Treatments   Labs (all labs ordered are listed, but only abnormal results are displayed) Labs Reviewed  CBC WITH DIFFERENTIAL/PLATELET - Abnormal; Notable for the following components:      Result Value   WBC 2.7 (*)    RBC 3.36 (*)    Hemoglobin 10.2 (*)    HCT 32.0 (*)    RDW 18.9 (*)    Platelets 73 (*)    Neutro Abs 1.3 (*)    All other components within normal limits  COMPREHENSIVE METABOLIC PANEL - Abnormal; Notable for the following components:   CO2 20 (*)    BUN 45 (*)    Creatinine, Ser 2.57 (*)    Albumin 3.1 (*)    GFR, Estimated 21 (*)    All other components within normal limits  AMMONIA - Abnormal; Notable for the following components:   Ammonia 152 (*)    All other components within normal limits  RESPIRATORY PANEL BY RT PCR (FLU A&B, COVID)  URINE CULTURE  ETHANOL  URINALYSIS, ROUTINE W REFLEX MICROSCOPIC    EKG EKG Interpretation  Date/Time:  Saturday November 11 2019 16:51:46 EDT Ventricular Rate:  64 PR Interval:    QRS Duration: 91 QT Interval:  473 QTC Calculation: 489 R Axis:   65 Text Interpretation: Sinus rhythm Borderline prolonged QT interval No significant change since last tracing Confirmed by Wandra Arthurs [phone]) on 11/11/2019 4:54:00 PM   Radiology CT Head Wo Contrast  Result Date: 11/11/2019 CLINICAL DATA:  Mental  status changes.  Lethargic. EXAM: CT HEAD WITHOUT CONTRAST TECHNIQUE: Contiguous axial images were obtained from the base of the skull through the vertex without intravenous contrast. COMPARISON:  10/22/2019 FINDINGS: Brain: There is no intra or extra-axial fluid collection or mass lesion. The basilar cisterns and ventricles have a normal  appearance. There is no CT evidence for acute infarction or hemorrhage. Mild periventricular white matter changes are consistent with small vessel disease and stable since the prior studies. Vascular: There is atherosclerotic calcification of the internal carotid arteries. No hyperdense vessels. Skull: Normal. Negative for fracture or focal lesion. Sinuses/Orbits: No acute finding. Other: None IMPRESSION: 1. No evidence for acute intracranial abnormality. 2. Stable mild small vessel disease. Electronically Signed   By: Nolon Nations M.D.   On: 11/11/2019 17:14   DG Chest Port 1 View  Result Date: 11/11/2019 CLINICAL DATA:  Altered mental status EXAM: PORTABLE CHEST 1 VIEW COMPARISON:  10/23/2019 FINDINGS: Heart and mediastinal contours are within normal limits. No focal opacities or effusions. No acute bony abnormality. IMPRESSION: No active disease. Electronically Signed   By: Rolm Baptise M.D.   On: 11/11/2019 17:03    Procedures Procedures (including critical care time)  CRITICAL CARE Performed by: Wandra Arthurs   Total critical care time: 30 minutes  Critical care time was exclusive of separately billable procedures and treating other patients.  Critical care was necessary to treat or prevent imminent or life-threatening deterioration.  Critical care was time spent personally by me on the following activities: development of treatment plan with patient and/or surrogate as well as nursing, discussions with consultants, evaluation of patient's response to treatment, examination of patient, obtaining history from patient or surrogate, ordering and performing treatments and interventions, ordering and review of laboratory studies, ordering and review of radiographic studies, pulse oximetry and re-evaluation of patient's condition.   Medications Ordered in ED Medications  lactulose (CHRONULAC) 10 GM/15ML solution 30 g (has no administration in time range)  rifaximin (XIFAXAN) tablet 550 mg  (has no administration in time range)  sodium chloride 0.9 % bolus 1,000 mL (1,000 mLs Intravenous New Bag/Given 11/11/19 1856)    ED Course  I have reviewed the triage vital signs and the nursing notes.  Pertinent labs & imaging results that were available during my care of the patient were reviewed by me and considered in my medical decision making (see chart for details).    MDM Rules/Calculators/A&P                         Aysha Ayianna Darnold is a 59 y.o. female who presented with altered mental status.  Likely hepatic encephalopathy.  Also consider electrolyte abnormality versus sepsis.  Plan to get rectal temperature and CBC, CMP, alcohol level, ammonia, CT head, chest x-ray and urinalysis.  7:29 PM Ammonia level is 152.  CT head and labs and chest x-ray and urinalysis unremarkable.  Given lactulose and rifaximin.  Hospitalist to admit for hepatic encephalopathy.   Final Clinical Impression(s) / ED Diagnoses Final diagnoses:  None    Rx / DC Orders ED Discharge Orders    None       Drenda Freeze, MD 11/11/19 1930

## 2019-11-11 NOTE — H&P (Addendum)
History and Physical    Judy Meza GGY:[id number] DOB: 31-Dec-1960 DOA: 11/11/2019  PCP: Waylan Rocher, MD  Patient coming from: Home  I have personally briefly reviewed patient's old medical records in [place]  Chief Complaint: AMS  HPI: Judy Meza is a 59 y.o. female with medical history significant of ESLD, recurrent admits for hepatic encephalopathy, most recently earlier this month.  This in setting of HCV, ongoing EtOH abuse.  Pt lives at home with sister who is caregiver.  Sister has to "force" pt to eat and take meds.  No missed lactulose doses but pt woke up today, noted to be altered and confused.  This progressed throughout the day, became severe, and pt sent in to ED.  Pt is very altered, nonverbal, and not able to contribute to history at this time.  Review of chart however reveals that patient has frequent history of admissions for hepatic encephalopathy, most recently just 20 days ago.  Hepatic encephalopathy improved with PR lactulose at that time.   ED Course: WBC 2.7k, HGB 10.2k, platelets 73 (all chronic).  Ammonia 152.  Creat 2.57 (chronic).   Review of Systems: Unable to perform due to AMS.  Past Medical History:  Diagnosis Date  . Cancer ([place])   . COPD (chronic obstructive pulmonary disease) ([place])   . 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 Hypertension   . Liver disease   . Renal disorder     Past Surgical History:  Procedure Laterality Date  . ESOPHAGEAL BANDING  10/04/2019   Procedure: ESOPHAGEAL BANDING;  Surgeon: Lavena Bullion, DO;  Location: [facility];  Service: Gastroenterology;;  . ESOPHAGOGASTRODUODENOSCOPY (EGD) WITH PROPOFOL N/A 10/04/2019   Procedure: ESOPHAGOGASTRODUODENOSCOPY (EGD) WITH PROPOFOL;  Surgeon: Lavena Bullion, DO;  Location: [facility];  Service: Gastroenterology;  Laterality: N/A;  . HOT HEMOSTASIS N/A 10/04/2019   Procedure: HOT HEMOSTASIS (ARGON PLASMA  COAGULATION/BICAP);  Surgeon: Lavena Bullion, DO;  Location: [facility];  Service: Gastroenterology;  Laterality: N/A;  . PORT-A-CATH REMOVAL       reports that she quit smoking about 7 months ago. Her smoking use included cigarettes. She smoked 0.25 packs per day. She has never used smokeless tobacco. She reports previous alcohol use. She reports previous drug use.  No Known Allergies  Family History  Problem Relation Age of Onset  . Healthy Mother   . Hypertension Other      Prior to Admission medications   Medication Sig Start Date End Date Taking? Authorizing Provider  albuterol (PROVENTIL) (2.5 MG/3ML) 0.083% nebulizer solution Take 3 mLs (2.5 mg total) by nebulization every 6 (six) hours as needed for wheezing or shortness of breath. 06/01/19  Yes Vevelyn Francois, NP  ergocalciferol (VITAMIN D2) 1.25 MG (50000 UT) capsule Take 50,000 Units by mouth once a week. Fridays   Yes [provider]  famotidine (PEPCID) 20 MG tablet Take 20 mg by mouth daily.   Yes [provider]  feeding supplement, ENSURE ENLIVE, (ENSURE ENLIVE) LIQD Take 237 mLs by mouth 2 (two) times daily between meals. 05/02/19  Yes Eugenie Filler, MD  folic acid (FOLVITE) 1 MG tablet Take 1 tablet (1 mg total) by mouth daily. 10/09/19  Yes Andrew Au, MD  furosemide (LASIX) 20 MG tablet Take 2 tablets (40 mg total) by mouth every Monday, Wednesday, and Friday. 10/30/19  Yes Florencia Reasons, MD  gabapentin (NEURONTIN) 100 MG capsule Take 200 mg by mouth 3 (three)  times daily.   Yes [provider]  hydrOXYzine (ATARAX/VISTARIL) 25 MG tablet Take 25 mg by mouth every 6 (six) hours as needed for anxiety or itching.   Yes [provider]  lactulose (CHRONULAC) 10 GM/15ML solution Take 30 mLs (20 g total) by mouth 3 (three) times daily. 06/01/19  Yes Vevelyn Francois, NP  loratadine (CLARITIN) 10 MG tablet Take 1 tablet (10 mg total) by mouth daily. 06/01/19 11/11/19 Yes King, Diona Foley,  NP  nadolol (CORGARD) 40 MG tablet Take 1 tablet (40 mg total) by mouth daily. 10/09/19  Yes Andrew Au, MD  Olopatadine HCl (PATADAY) 0.2 % SOLN Place 1 drop into both eyes daily as needed (allergic conjunctivitis).   Yes [provider]  PARoxetine (PAXIL) 40 MG tablet Take 1 tablet (40 mg total) by mouth every morning. 06/01/19 11/11/19 Yes Vevelyn Francois, NP  rifaximin (XIFAXAN) 550 MG TABS tablet Take 1 tablet (550 mg total) by mouth 2 (two) times daily. 06/09/19  Yes Vevelyn Francois, NP  Skin Protectants, Misc. (EUCERIN) cream Apply 1 application topically daily as needed for dry skin.    Yes [provider]  sodium bicarbonate 650 MG tablet Take 1 tablet (650 mg total) by mouth 2 (two) times daily. 08/09/19  Yes Hosie Poisson, MD  Sofosbuvir-Velpatasvir (EPCLUSA) 400-100 MG TABS Take 1 tablet by mouth daily. 07/12/19  Yes Kuppelweiser, Cassie L, RPH-CPP  spironolactone (ALDACTONE) 100 MG tablet Take 1 tablet (100 mg total) by mouth every Monday, Wednesday, and Friday. 10/30/19  Yes Florencia Reasons, MD  thiamine 100 MG tablet Take 1 tablet (100 mg total) by mouth daily. 06/08/19  Yes Rai, Vernelle Emerald, MD    Physical Exam: Vitals:   11/11/19 1748 11/11/19 1800 11/11/19 1830 11/11/19 1900  BP: (!) 137/95 138/90 (!) 142/104 (!) 146/98  Pulse: 64     Resp: 16 16 16 13   Temp:      TempSrc:      SpO2: 100%       Constitutional: Lethargic Eyes: PERRL, lids and conjunctivae normal ENMT: Mucous membranes are moist. Posterior pharynx clear of any exudate or lesions.Normal dentition.  Neck: normal, supple, no masses, no thyromegaly Respiratory: clear to auscultation bilaterally, no wheezing, no crackles. Normal respiratory effort. No accessory muscle use.  Cardiovascular: Regular rate and rhythm, no murmurs / rubs / gallops. No extremity edema. 2+ pedal pulses. No carotid bruits.  Abdomen: no tenderness, no masses palpated. No hepatosplenomegaly. Bowel sounds positive.    Musculoskeletal: no clubbing / cyanosis. No joint deformity upper and lower extremities. Good ROM, no contractures. Normal muscle tone.  Skin: no rashes, lesions, ulcers. No induration Neurologic: Lethargic, non-verbal, MAE to arousal. Psychiatric:Lethargic   Labs on Admission: I have personally reviewed following labs and imaging studies  CBC: Recent Labs  Lab 11/11/19 1742  WBC 2.7*  NEUTROABS 1.3*  HGB 10.2*  HCT 32.0*  MCV 95.2  PLT 73*   Basic Metabolic Panel: Recent Labs  Lab 11/11/19 1742  NA 138  K 4.7  CL 108  CO2 20*  GLUCOSE 99  BUN 45*  CREATININE 2.57*  CALCIUM 9.2   GFR: CrCl cannot be calculated (Unknown ideal weight.). Liver Function Tests: Recent Labs  Lab 11/11/19 1742  AST 40  ALT 21  ALKPHOS 60  BILITOT 1.2  PROT 7.3  ALBUMIN 3.1*   No results for input(s): LIPASE, AMYLASE in the last 168 hours. Recent Labs  Lab 11/11/19 1742  AMMONIA 152*  Coagulation Profile: No results for input(s): INR, PROTIME in the last 168 hours. Cardiac Enzymes: No results for input(s): CKTOTAL, CKMB, CKMBINDEX, TROPONINI in the last 168 hours. BNP (last 3 results) No results for input(s): PROBNP in the last 8760 hours. HbA1C: No results for input(s): HGBA1C in the last 72 hours. CBG: No results for input(s): GLUCAP in the last 168 hours. Lipid Profile: No results for input(s): CHOL, HDL, LDLCALC, TRIG, CHOLHDL, LDLDIRECT in the last 72 hours. Thyroid Function Tests: No results for input(s): TSH, T4TOTAL, FREET4, T3FREE, THYROIDAB in the last 72 hours. Anemia Panel: No results for input(s): VITAMINB12, FOLATE, FERRITIN, TIBC, IRON, RETICCTPCT in the last 72 hours. Urine analysis:    Component Value Date/Time   COLORURINE YELLOW 11/11/2019 1742   APPEARANCEUR CLEAR 11/11/2019 1742   LABSPEC 1.017 11/11/2019 1742   PHURINE 7.0 11/11/2019 1742   GLUCOSEU NEGATIVE 11/11/2019 1742   HGBUR NEGATIVE 11/11/2019 1742   BILIRUBINUR NEGATIVE 11/11/2019  1742   KETONESUR NEGATIVE 11/11/2019 1742   PROTEINUR NEGATIVE 11/11/2019 1742   NITRITE NEGATIVE 11/11/2019 1742   [place] 11/11/2019 1742    Radiological Exams on Admission: CT Head Wo Contrast  Result Date: 11/11/2019 CLINICAL DATA:  Mental status changes.  Lethargic. EXAM: CT HEAD WITHOUT CONTRAST TECHNIQUE: Contiguous axial images were obtained from the base of the skull through the vertex without intravenous contrast. COMPARISON:  10/22/2019 FINDINGS: Brain: There is no intra or extra-axial fluid collection or mass lesion. The basilar cisterns and ventricles have a normal appearance. There is no CT evidence for acute infarction or hemorrhage. Mild periventricular white matter changes are consistent with small vessel disease and stable since the prior studies. Vascular: There is atherosclerotic calcification of the internal carotid arteries. No hyperdense vessels. Skull: Normal. Negative for fracture or focal lesion. Sinuses/Orbits: No acute finding. Other: None IMPRESSION: 1. No evidence for acute intracranial abnormality. 2. Stable mild small vessel disease. Electronically Signed   By: Nolon Nations M.D.   On: 11/11/2019 17:14   DG Chest Port 1 View  Result Date: 11/11/2019 CLINICAL DATA:  Altered mental status EXAM: PORTABLE CHEST 1 VIEW COMPARISON:  10/23/2019 FINDINGS: Heart and mediastinal contours are within normal limits. No focal opacities or effusions. No acute bony abnormality. IMPRESSION: No active disease. Electronically Signed   By: Rolm Baptise M.D.   On: 11/11/2019 17:03    EKG: Independently reviewed.  Assessment/Plan Principal Problem:   Acute hepatic encephalopathy Active Problems:   CKD (chronic kidney disease), stage III (HCC)   Alcoholic cirrhosis of liver with ascites (HCC)   Thrombocytopenia (HCC)   Dementia without behavioral disturbance (HCC)   Chronic hepatitis C (HCC)   End stage liver disease ([place])    1. Acute hepatic encephalopathy  - in setting of ESLD 1. With hyperammonemia 2. Lactulose PR ordered 3. Hopefully after PR lactulose she wakes up and able to take POs (like last admit) in which case resume rifaxan and PO lactulose as well as other PO meds. 4. If not improved overnight then will need to put on IVF in AM. 5. Repeat CBC/CMP in AM 2. EtOH cirrhosis - 1. Holding diuretics but ordering other home meds for when able to take POs 2. Sounds like she still drinks wine from documentation last admit 3. HCV - 1. Cont Epclusa when able to take POs 4. Pancytopenia - 1. Chronic and stable 2. HGB stable from last admit 3. No evidence of new bleed 5. Esophageal varicies - 1. Banding in Sept 2.  No evidence of new bleed 3. Next EGD scheduled for 12 days from now it looks like 6. CKD stage 3-4 - chronic and stable  DVT prophylaxis: SCDs Code Status: DNR Family Communication: No family in room Disposition Plan: Home after mental status improved Consults called: None Admission status: Place in 92    Judy Meza, [place] [facility]  How to contact the [facility] Attending or Consulting provider [place] or covering provider during after hours [place], for this patient?  1. Check the care team in [facility] and look for a) attending/consulting [facility] provider listed and b) the [facility] team listed 2. Log into [email or web address]  Amion Physician Scheduling and messaging for groups and whole hospitals  On call and physician scheduling software for group practices, residents, hospitalists and other medical providers for call, clinic, rotation and shift schedules. OnCall Enterprise is a hospital-wide system for scheduling doctors and paging doctors on call. EasyPlot is for scientific plotting and data analysis.  [email or web address]  and use [place]'s universal password to access. If you do not have the password, please contact the hospital operator.  3. Locate the [facility] provider you are looking for under [facility] and page to a number  that you can be directly reached. 4. If you still have difficulty reaching the provider, please page the [facility] (Director on Call) for the Hospitalists listed on amion for assistance.  11/11/2019, 8:28 PM

## 2019-11-11 NOTE — Progress Notes (Addendum)
Lactulose enema given with balloon catheter per order. Pt tolerated well.

## 2019-11-12 DIAGNOSIS — K7460 Unspecified cirrhosis of liver: Secondary | ICD-10-CM | POA: Diagnosis not present

## 2019-11-12 DIAGNOSIS — D696 Thrombocytopenia, unspecified: Secondary | ICD-10-CM | POA: Diagnosis not present

## 2019-11-12 DIAGNOSIS — R531 Weakness: Secondary | ICD-10-CM

## 2019-11-12 DIAGNOSIS — F1010 Alcohol abuse, uncomplicated: Secondary | ICD-10-CM | POA: Diagnosis present

## 2019-11-12 DIAGNOSIS — K7290 Hepatic failure, unspecified without coma: Secondary | ICD-10-CM | POA: Diagnosis not present

## 2019-11-12 DIAGNOSIS — Z8521 Personal history of malignant neoplasm of larynx: Secondary | ICD-10-CM | POA: Diagnosis not present

## 2019-11-12 DIAGNOSIS — Z7189 Other specified counseling: Secondary | ICD-10-CM | POA: Diagnosis not present

## 2019-11-12 DIAGNOSIS — B182 Chronic viral hepatitis C: Secondary | ICD-10-CM | POA: Diagnosis not present

## 2019-11-12 DIAGNOSIS — Z515 Encounter for palliative care: Secondary | ICD-10-CM

## 2019-11-12 DIAGNOSIS — Z66 Do not resuscitate: Secondary | ICD-10-CM | POA: Diagnosis present

## 2019-11-12 DIAGNOSIS — Z79899 Other long term (current) drug therapy: Secondary | ICD-10-CM | POA: Diagnosis not present

## 2019-11-12 DIAGNOSIS — F0390 Unspecified dementia without behavioral disturbance: Secondary | ICD-10-CM | POA: Diagnosis not present

## 2019-11-12 DIAGNOSIS — K7040 Alcoholic hepatic failure without coma: Secondary | ICD-10-CM | POA: Diagnosis present

## 2019-11-12 DIAGNOSIS — K7031 Alcoholic cirrhosis of liver with ascites: Secondary | ICD-10-CM | POA: Diagnosis present

## 2019-11-12 DIAGNOSIS — Z20822 Contact with and (suspected) exposure to covid-19: Secondary | ICD-10-CM | POA: Diagnosis present

## 2019-11-12 DIAGNOSIS — K7200 Acute and subacute hepatic failure without coma: Secondary | ICD-10-CM | POA: Diagnosis not present

## 2019-11-12 DIAGNOSIS — D61818 Other pancytopenia: Secondary | ICD-10-CM | POA: Diagnosis not present

## 2019-11-12 DIAGNOSIS — Z8249 Family history of ischemic heart disease and other diseases of the circulatory system: Secondary | ICD-10-CM | POA: Diagnosis not present

## 2019-11-12 DIAGNOSIS — Z87891 Personal history of nicotine dependence: Secondary | ICD-10-CM | POA: Diagnosis not present

## 2019-11-12 DIAGNOSIS — N179 Acute kidney failure, unspecified: Secondary | ICD-10-CM | POA: Diagnosis not present

## 2019-11-12 DIAGNOSIS — N183 Chronic kidney disease, stage 3 unspecified: Secondary | ICD-10-CM | POA: Diagnosis not present

## 2019-11-12 DIAGNOSIS — I129 Hypertensive chronic kidney disease with stage 1 through stage 4 chronic kidney disease, or unspecified chronic kidney disease: Secondary | ICD-10-CM | POA: Diagnosis present

## 2019-11-12 DIAGNOSIS — U071 COVID-19: Secondary | ICD-10-CM | POA: Diagnosis not present

## 2019-11-12 DIAGNOSIS — B179 Acute viral hepatitis, unspecified: Secondary | ICD-10-CM | POA: Diagnosis not present

## 2019-11-12 DIAGNOSIS — J449 Chronic obstructive pulmonary disease, unspecified: Secondary | ICD-10-CM | POA: Diagnosis not present

## 2019-11-12 DIAGNOSIS — N1832 Chronic kidney disease, stage 3b: Secondary | ICD-10-CM | POA: Diagnosis present

## 2019-11-12 DIAGNOSIS — I878 Other specified disorders of veins: Secondary | ICD-10-CM | POA: Diagnosis present

## 2019-11-12 DIAGNOSIS — R945 Abnormal results of liver function studies: Secondary | ICD-10-CM | POA: Diagnosis not present

## 2019-11-12 DIAGNOSIS — Z930 Tracheostomy status: Secondary | ICD-10-CM | POA: Diagnosis not present

## 2019-11-12 LAB — COMPREHENSIVE METABOLIC PANEL
ALT: 22 U/L (ref 0–44)
AST: 44 U/L — ABNORMAL HIGH (ref 15–41)
Albumin: 2.9 g/dL — ABNORMAL LOW (ref 3.5–5.0)
Alkaline Phosphatase: 59 U/L (ref 38–126)
Anion gap: 10 (ref 5–15)
BUN: 39 mg/dL — ABNORMAL HIGH (ref 6–20)
CO2: 20 mmol/L — ABNORMAL LOW (ref 22–32)
Calcium: 9 mg/dL (ref 8.9–10.3)
Chloride: 111 mmol/L (ref 98–111)
Creatinine, Ser: 2.02 mg/dL — ABNORMAL HIGH (ref 0.44–1.00)
GFR, Estimated: 28 mL/min — ABNORMAL LOW (ref >60)
Glucose, Bld: 110 mg/dL — ABNORMAL HIGH (ref 70–99)
Potassium: 4.8 mmol/L (ref 3.5–5.1)
Sodium: 141 mmol/L (ref 135–145)
Total Bilirubin: 1.3 mg/dL — ABNORMAL HIGH (ref 0.3–1.2)
Total Protein: 7.2 g/dL (ref 6.5–8.1)

## 2019-11-12 LAB — CBC
HCT: 37.7 % (ref 36.0–46.0)
Hemoglobin: 11.8 g/dL — ABNORMAL LOW (ref 12.0–15.0)
MCH: 30.7 pg (ref 26.0–34.0)
MCHC: 31.3 g/dL (ref 30.0–36.0)
MCV: 98.2 fL (ref 80.0–100.0)
Platelets: 74 10*3/uL — ABNORMAL LOW (ref 150–400)
RBC: 3.84 MIL/uL — ABNORMAL LOW (ref 3.87–5.11)
RDW: 18.9 % — ABNORMAL HIGH (ref 11.5–15.5)
WBC: 5.3 10*3/uL (ref 4.0–10.5)
nRBC: 0 % (ref 0.0–0.2)

## 2019-11-12 MED ORDER — NADOLOL 20 MG PO TABS
40.0000 mg | ORAL_TABLET | Freq: Every day | ORAL | Status: DC
  Administered 2019-11-12 – 2019-11-13 (×2): 40 mg via ORAL
  Filled 2019-11-12 (×2): qty 2

## 2019-11-12 MED ORDER — SOFOSBUVIR-VELPATASVIR 400-100 MG PO TABS
1.0000 | ORAL_TABLET | Freq: Every day | ORAL | Status: DC
  Administered 2019-11-12: 1 via ORAL

## 2019-11-12 MED ORDER — HYDROCERIN EX CREA
TOPICAL_CREAM | Freq: Two times a day (BID) | CUTANEOUS | Status: DC
  Administered 2019-11-12 – 2019-11-13 (×2): 1 via TOPICAL
  Filled 2019-11-12: qty 113

## 2019-11-12 NOTE — Progress Notes (Signed)
PROGRESS NOTE    Judy Meza  GQQ:[id number] DOB: 04/29/1960 DOA: 11/11/2019 PCP: Waylan Rocher, MD    Brief Narrative:  59 year old female with history of end-stage liver disease, recurrent admissions for hepatic encephalopathy, hepatitis C and ongoing alcohol use brought from home by sister where she was not able to be aroused.  Patient was found altered, nonverbal and difficult to wake up in the ER.  Ammonia was 152.  Admitted with acute hepatic encephalopathy. For last few weeks, living under supervision with her sister and reportedly not drinking any alcohol.   Assessment & Plan:   Principal Problem:   Acute hepatic encephalopathy Active Problems:   CKD (chronic kidney disease), stage III (HCC)   Alcoholic cirrhosis of liver with ascites (HCC)   Thrombocytopenia (HCC)   Dementia without behavioral disturbance (HCC)   Chronic hepatitis C (HCC)   End stage liver disease (HCC)  Altered mental status/acute hepatic encephalopathy in the setting of ESLD: Presented with depressed mentation.  Treated symptomatically with fluid, rectal lactulose. Patient with some improvement of mentation today. Will start oral medications including oral lactulose.  Monitor bowel movements. Ammonia level is not correlated with level of consciousness, will not benefit with serum ammonia level monitoring. Patient has multifactorial cirrhosis including hepatitis C and also keeps drinking alcohol.  Prognosis is very poor. Resume Reprexain, oral lactulose. Will consult palliative medicine, she will probably benefit with palliative care or hospice.  CKD stage IIIb: At about her baseline.  Hepatitis C: Patient is on antiretroviral.  Will resume once she is more awake.   DVT prophylaxis: SCDs Start: 11/11/19 1959   Code Status: DNR Family Communication: Patient's sister Ms. Curt Bears on the phone Disposition Plan: Status is: Observation  The patient will require care spanning > 2  midnights and should be moved to inpatient because: Altered mental status and Inpatient level of care appropriate due to severity of illness  Dispo: The patient is from: Home              Anticipated d/c is to: SNF              Anticipated d/c date is: 2 days              Patient currently is not medically stable to d/c.  Called and discussed with patient's sister.  Patient is scheduled to be admitted to a long-term nursing home tomorrow 11/1 with the pace program.  Patient is now waking up but is still not participating fully.  She needs to stay in the hospital and monitored for improvement and if adequate improvement possibly can be discharged to nursing home tomorrow.       Consultants:   Palliative  Procedures:   None  Antimicrobials:   None   Subjective: Patient seen and examined.  Overnight events noted.  Early morning rounds, she was awake and was able to communicate.  Denied any complaints.  She has difficulty with her phonation because of tracheostomy.  Objective: Vitals:   11/12/19 0223 11/12/19 0620 11/12/19 0634 11/12/19 1029  BP: (!) 149/97 (!) 136/93  116/78  Pulse: 67 63 62 67  Resp: 17 18 18 16   Temp: 97.6 F (36.4 C) (!) 97.5 F (36.4 C)  (!) 97.5 F (36.4 C)  TempSrc: Oral Oral  Oral  SpO2: 95% 98% 98% 99%    Intake/Output Summary (Last 24 hours) at 11/12/2019 1258 Last data filed at 11/12/2019 0909 Gross per 24 hour  Intake 120 ml  Output  2 ml  Net 118 ml   There were no vitals filed for this visit.  Examination:  General exam: Chronically sick looking, frail lady laying in bed.  On room air.  Not in any distress. Respiratory system: Patient has a tracheostomy on her neck, slight secretions.  Airway is clear and lungs are clear. Cardiovascular system: S1 & S2 heard, RRR.  Gastrointestinal system: Abdomen is nondistended, soft and nontender. No organomegaly or masses felt. Normal bowel sounds heard. Central nervous system: Alert and  oriented.  Generalized weakness. Extremities: Generalized weakness.  She has pigmentation and venous stasis changes on her legs. Flat affect.    Data Reviewed: I have personally reviewed following labs and imaging studies  CBC: Recent Labs  Lab 11/11/19 1742 11/12/19 0455  WBC 2.7* 5.3  NEUTROABS 1.3*  --   HGB 10.2* 11.8*  HCT 32.0* 37.7  MCV 95.2 98.2  PLT 73* 74*   Basic Metabolic Panel: Recent Labs  Lab 11/11/19 1742 11/12/19 0455  NA 138 141  K 4.7 4.8  CL 108 111  CO2 20* 20*  GLUCOSE 99 110*  BUN 45* 39*  CREATININE 2.57* 2.02*  CALCIUM 9.2 9.0   GFR: CrCl cannot be calculated (Unknown ideal weight.). Liver Function Tests: Recent Labs  Lab 11/11/19 1742 11/12/19 0455  AST 40 44*  ALT 21 22  ALKPHOS 60 59  BILITOT 1.2 1.3*  PROT 7.3 7.2  ALBUMIN 3.1* 2.9*   No results for input(s): LIPASE, AMYLASE in the last 168 hours. Recent Labs  Lab 11/11/19 1742  AMMONIA 152*   Coagulation Profile: No results for input(s): INR, PROTIME in the last 168 hours. Cardiac Enzymes: No results for input(s): CKTOTAL, CKMB, CKMBINDEX, TROPONINI in the last 168 hours. BNP (last 3 results) No results for input(s): PROBNP in the last 8760 hours. HbA1C: No results for input(s): HGBA1C in the last 72 hours. CBG: No results for input(s): GLUCAP in the last 168 hours. Lipid Profile: No results for input(s): CHOL, HDL, LDLCALC, TRIG, CHOLHDL, LDLDIRECT in the last 72 hours. Thyroid Function Tests: No results for input(s): TSH, T4TOTAL, FREET4, T3FREE, THYROIDAB in the last 72 hours. Anemia Panel: No results for input(s): VITAMINB12, FOLATE, FERRITIN, TIBC, IRON, RETICCTPCT in the last 72 hours. Sepsis Labs: No results for input(s): PROCALCITON, LATICACIDVEN in the last 168 hours.  Recent Results (from the past 240 hour(s))  Respiratory Panel by RT PCR (Flu A&B, Covid) - Nasopharyngeal Swab     Status: None   Collection Time: 11/11/19  5:13 PM   Specimen:  Nasopharyngeal Swab  Result Value Ref Range Status   SARS Coronavirus 2 by RT PCR NEGATIVE NEGATIVE Final    Comment: (NOTE) SARS-CoV-2 target nucleic acids are NOT DETECTED.  The SARS-CoV-2 RNA is generally detectable in upper respiratoy specimens during the acute phase of infection. The lowest concentration of SARS-CoV-2 viral copies this assay can detect is 131 copies/mL. A negative result does not preclude SARS-Cov-2 infection and should not be used as the sole basis for treatment or other patient management decisions. A negative result may occur with  improper specimen collection/handling, submission of specimen other than nasopharyngeal swab, presence of viral mutation(s) within the areas targeted by this assay, and inadequate number of viral copies (<131 copies/mL). A negative result must be combined with clinical observations, patient history, and epidemiological information. The expected result is Negative.  Fact Sheet for Patients:  PinkCheek.be  Fact Sheet for Healthcare Providers:  GravelBags.it  This test is no t  yet approved or cleared by the [place] and  has been authorized for detection and/or diagnosis of SARS-CoV-2 by FDA under an Emergency Use Authorization (EUA). This EUA will remain  in effect (meaning this test can be used) for the duration of the COVID-19 declaration under Section 564(b)(1) of the Act, 21 U.S.C. section 360bbb-3(b)(1), unless the authorization is terminated or revoked sooner.     Influenza A by PCR NEGATIVE NEGATIVE Final   Influenza B by PCR NEGATIVE NEGATIVE Final    Comment: (NOTE) The Xpert Xpress SARS-CoV-2/FLU/RSV assay is intended as an aid in  the diagnosis of influenza from Nasopharyngeal swab specimens and  should not be used as a sole basis for treatment. Nasal washings and  aspirates are unacceptable for Xpert Xpress SARS-CoV-2/FLU/RSV  testing.  Fact Sheet  for Patients: PinkCheek.be  Fact Sheet for Healthcare Providers: GravelBags.it  This test is not yet approved or cleared by the [place] FDA and  has been authorized for detection and/or diagnosis of SARS-CoV-2 by  FDA under an Emergency Use Authorization (EUA). This EUA will remain  in effect (meaning this test can be used) for the duration of the  Covid-19 declaration under Section 564(b)(1) of the Act, 21  U.S.C. section 360bbb-3(b)(1), unless the authorization is  terminated or revoked. Performed at [facility], [place] [address], [place], [place] [address]          Radiology Studies: CT Head Wo Contrast  Result Date: 11/11/2019 CLINICAL DATA:  Mental status changes.  Lethargic. EXAM: CT HEAD WITHOUT CONTRAST TECHNIQUE: Contiguous axial images were obtained from the base of the skull through the vertex without intravenous contrast. COMPARISON:  10/22/2019 FINDINGS: Brain: There is no intra or extra-axial fluid collection or mass lesion. The basilar cisterns and ventricles have a normal appearance. There is no CT evidence for acute infarction or hemorrhage. Mild periventricular white matter changes are consistent with small vessel disease and stable since the prior studies. Vascular: There is atherosclerotic calcification of the internal carotid arteries. No hyperdense vessels. Skull: Normal. Negative for fracture or focal lesion. Sinuses/Orbits: No acute finding. Other: None IMPRESSION: 1. No evidence for acute intracranial abnormality. 2. Stable mild small vessel disease. Electronically Signed   By: Nolon Nations M.D.   On: 11/11/2019 17:14   DG Chest Port 1 View  Result Date: 11/11/2019 CLINICAL DATA:  Altered mental status EXAM: PORTABLE CHEST 1 VIEW COMPARISON:  10/23/2019 FINDINGS: Heart and mediastinal contours are within normal limits. No focal opacities or effusions. No acute bony abnormality.  IMPRESSION: No active disease. Electronically Signed   By: Rolm Baptise M.D.   On: 11/11/2019 17:03        Scheduled Meds: . famotidine  20 mg Oral Daily  . feeding supplement  237 mL Oral BID BM  . folic acid  1 mg Oral Daily  . gabapentin  200 mg Oral TID  . lactulose  20 g Oral TID  . loratadine  10 mg Oral Daily  . nadolol  40 mg Oral Daily  . PARoxetine  40 mg Oral Daily  . rifaximin  550 mg Oral BID  . sodium bicarbonate  650 mg Oral BID  . Sofosbuvir-Velpatasvir  1 tablet Oral Daily  . thiamine  100 mg Oral Daily   Continuous Infusions:   LOS: 0 days    Time spent: 35 minutes    Barb Merino, MD [facility] Pager [phone]

## 2019-11-12 NOTE — Consult Note (Signed)
Consultation Note Date: 11/12/2019   Patient Name: Judy Meza  DOB: Feb 03, 1960  MRN: [id number]  Age / Sex: 59 y.o., female  PCP: Entzminger, Mendel Corning, Judy Meza Referring Physician: Barb Merino, Judy Meza  Reason for Consultation: Establishing goals of care  HPI/Patient Profile: 59 y.o. female    admitted on 11/11/2019    Clinical Assessment and Goals of Care: 59 year old lady, known to palliative medicine service, seen in a previous hospitalization, life limiting illness of end-stage liver disease and recurrent admissions for hepatic encephalopathy hepatitis C and ongoing alcohol use.  Patient admitted for altered mental status acute hepatic encephalopathy in the setting of end-stage liver disease.  Also has stage III chronic kidney disease and chronic hepatitis C.  Patient admitted to hospital medicine service was given lactulose and was observed.  Palliative consultation requested for ongoing goals of care discussions.  Judy Meza is awake alert resting in bed.  She is able to press on her neck dressing and communicate effectively.  She states that she does not have any pain.  She states she is going to rehab tomorrow.  Palliative medicine is specialized medical care for people living with serious illness. It focuses on providing relief from the symptoms and stress of a serious illness. The goal is to improve quality of life for both the patient and the family.  Goals of care: Broad aims of medical therapy in relation to the patient's values and preferences. Our aim is to provide medical care aimed at enabling patients to achieve the goals that matter most to them, given the circumstances of their particular medical situation and their constraints.   See below.  NEXT OF KIN Judy Meza  SUMMARY OF RECOMMENDATIONS   Agree with DO NOT RESUSCITATE Patient is enrolled in the [facility] program, to be admitted to  long-term nursing home on 11-1.  Recommend palliative follow-up over there. Code Status/Advance Care Planning:  DNR    Symptom Management:   Continue current mode of care  Palliative Prophylaxis:   Delirium Protocol   Psycho-social/Spiritual:   Desire for further Chaplaincy support:yes  Additional Recommendations: Caregiving  Support/Resources  Prognosis:   Unable to determine  Discharge Planning: [place] for rehab with Palliative care service follow-up      Primary Diagnoses: Present on Admission: . Acute hepatic encephalopathy . Alcoholic cirrhosis of liver with ascites ([place]) . Dementia without behavioral disturbance ([place]) . Thrombocytopenia ([place]) . CKD (chronic kidney disease), stage III ([place]) . Chronic hepatitis C ([place]) . End stage liver disease ([place])   I have reviewed the medical record, interviewed the patient and family, and examined the patient. The following aspects are pertinent.  Past Medical History:  Diagnosis Date  . Cancer ([place])   . COPD (chronic obstructive pulmonary disease) ([place])   . 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 Hypertension   . Liver disease   . Renal disorder    Social History   Socioeconomic History  . Marital status: Single    Spouse name: Not on  file  . Number of children: Not on file  . Years of education: Not on file  . Highest education level: Not on file  Occupational History  . Not on file  Tobacco Use  . Smoking status: Former Smoker    Packs/day: 0.25    Types: Cigarettes    Quit date: 04/03/2019    Years since quitting: 0.6  . Smokeless tobacco: Never Used  Vaping Use  . Vaping Use: Never used  Substance and Sexual Activity  . Alcohol use: Not Currently  . Drug use: Not Currently  . Sexual activity: Not Currently  Other Topics Concern  . Not on file  Social History Narrative  . Not on file   Social Determinants of Health   Financial Resource Strain:   . Difficulty of  Paying Living Expenses: Not on file  Food Insecurity:   . Worried About Charity fundraiser in the Last Year: Not on file  . Ran Out of Food in the Last Year: Not on file  Transportation Needs:   . Lack of Transportation (Medical): Not on file  . Lack of Transportation (Non-Medical): Not on file  Physical Activity:   . Days of Exercise per Week: Not on file  . Minutes of Exercise per Session: Not on file  Stress:   . Feeling of Stress : Not on file  Social Connections:   . Frequency of Communication with Friends and Family: Not on file  . Frequency of Social Gatherings with Friends and Family: Not on file  . Attends Religious Services: Not on file  . Active Member of Clubs or Organizations: Not on file  . Attends Archivist Meetings: Not on file  . Marital Status: Not on file   Family History  Problem Relation Age of Onset  . Healthy Mother   . Hypertension Other    Scheduled Meds: . famotidine  20 mg Oral Daily  . feeding supplement  237 mL Oral BID BM  . folic acid  1 mg Oral Daily  . gabapentin  200 mg Oral TID  . hydrocerin   Topical BID  . lactulose  20 g Oral TID  . loratadine  10 mg Oral Daily  . nadolol  40 mg Oral Daily  . PARoxetine  40 mg Oral Daily  . rifaximin  550 mg Oral BID  . sodium bicarbonate  650 mg Oral BID  . Sofosbuvir-Velpatasvir  1 tablet Oral Q supper  . thiamine  100 mg Oral Daily   Continuous Infusions: PRN Meds:.albuterol, hydrOXYzine, olopatadine Medications Prior to Admission:  Prior to Admission medications   Medication Sig Start Date End Date Taking? Authorizing Provider  albuterol (PROVENTIL) (2.5 MG/3ML) 0.083% nebulizer solution Take 3 mLs (2.5 mg total) by nebulization every 6 (six) hours as needed for wheezing or shortness of breath. 06/01/19  Yes Vevelyn Francois, Judy Meza  ergocalciferol (VITAMIN D2) 1.25 MG (50000 UT) capsule Take 50,000 Units by mouth once a week. Fridays   Yes Provider, Historical, Judy Meza  famotidine (PEPCID) 20  MG tablet Take 20 mg by mouth daily.   Yes Provider, Historical, Judy Meza  feeding supplement, ENSURE ENLIVE, (ENSURE ENLIVE) LIQD Take 237 mLs by mouth 2 (two) times daily between meals. 05/02/19  Yes Eugenie Filler, Judy Meza  folic acid (FOLVITE) 1 MG tablet Take 1 tablet (1 mg total) by mouth daily. 10/09/19  Yes Andrew Au, Judy Meza  furosemide (LASIX) 20 MG tablet Take 2 tablets (40 mg total) by mouth every  Monday, Wednesday, and Friday. 10/30/19  Yes Florencia Reasons, Judy Meza  gabapentin (NEURONTIN) 100 MG capsule Take 200 mg by mouth 3 (three) times daily.   Yes Provider, Historical, Judy Meza  hydrOXYzine (ATARAX/VISTARIL) 25 MG tablet Take 25 mg by mouth every 6 (six) hours as needed for anxiety or itching.   Yes Provider, Historical, Judy Meza  lactulose (CHRONULAC) 10 GM/15ML solution Take 30 mLs (20 g total) by mouth 3 (three) times daily. 06/01/19  Yes Vevelyn Francois, Judy Meza  loratadine (CLARITIN) 10 MG tablet Take 1 tablet (10 mg total) by mouth daily. 06/01/19 11/11/19 Yes King, Diona Foley, Judy Meza  nadolol (CORGARD) 40 MG tablet Take 1 tablet (40 mg total) by mouth daily. 10/09/19  Yes Andrew Au, Judy Meza  Olopatadine HCl (PATADAY) 0.2 % SOLN Place 1 drop into both eyes daily as needed (allergic conjunctivitis).   Yes Provider, Historical, Judy Meza  PARoxetine (PAXIL) 40 MG tablet Take 1 tablet (40 mg total) by mouth every morning. 06/01/19 11/11/19 Yes Vevelyn Francois, Judy Meza  rifaximin (XIFAXAN) 550 MG TABS tablet Take 1 tablet (550 mg total) by mouth 2 (two) times daily. 06/09/19  Yes Vevelyn Francois, Judy Meza  Skin Protectants, Misc. (EUCERIN) cream Apply 1 application topically daily as needed for dry skin.    Yes Provider, Historical, Judy Meza  sodium bicarbonate 650 MG tablet Take 1 tablet (650 mg total) by mouth 2 (two) times daily. 08/09/19  Yes Hosie Poisson, Judy Meza  Sofosbuvir-Velpatasvir (EPCLUSA) 400-100 MG TABS Take 1 tablet by mouth daily. 07/12/19  Yes Kuppelweiser, Cassie L, RPH-CPP  spironolactone (ALDACTONE) 100 MG tablet Take 1 tablet (100 mg  total) by mouth every Monday, Wednesday, and Friday. 10/30/19  Yes Florencia Reasons, Judy Meza  thiamine 100 MG tablet Take 1 tablet (100 mg total) by mouth daily. 06/08/19  Yes Rai, Ripudeep K, Judy Meza   No Known Allergies Review of Systems Denies pain  Physical Exam Awake alert resting in bed Has dressing over her neck, is able to mouth words effectively Regular work of breathing Alert and oriented and Has generalized weakness Has radiation changes on skin S1-S2 Nonfocal  Vital Signs: BP 135/79   Pulse 62   Temp 97.9 F (36.6 C) (Oral)   Resp 16   SpO2 100%  Pain Scale: 0-10   Pain Score: Asleep   SpO2: SpO2: 100 % O2 Device:SpO2: 100 % O2 Flow Rate: .O2 Flow Rate (L/min): 0 L/min  IO: Intake/output summary:   Intake/Output Summary (Last 24 hours) at 11/12/2019 1616 Last data filed at 11/12/2019 1243 Gross per 24 hour  Intake 360 ml  Output 2 ml  Net 358 ml    LBM: Last BM Date:  (Unknown ) Baseline Weight:   Most recent weight:       Palliative Assessment/Data:   PPS 50%  Time In:  1515 Time Out:  1615 Time Total:  60  Greater than 50%  of this time was spent counseling and coordinating care related to the above assessment and plan.  Signed by: Loistine Chance, Judy Meza   Please contact Palliative Medicine Team phone at [phone] for questions and concerns.  For individual provider: See Shea Evans

## 2019-11-13 ENCOUNTER — Encounter (HOSPITAL_COMMUNITY): Payer: Self-pay | Admitting: Internal Medicine

## 2019-11-13 ENCOUNTER — Ambulatory Visit: Payer: Medicaid Other | Admitting: Gastroenterology

## 2019-11-13 DIAGNOSIS — K7200 Acute and subacute hepatic failure without coma: Secondary | ICD-10-CM

## 2019-11-13 LAB — URINE CULTURE: Culture: NO GROWTH

## 2019-11-13 NOTE — TOC Transition Note (Addendum)
Transition of Care [facility]) - CM/SW Discharge Note   Patient Details  Name: Judy Meza MRN: [id number] Date of Birth: 05/16/1960  [facility] CM/SW Contact:  Trish Mage, LCSW Phone Number: 11/13/2019, 10:10 AM   Clinical Narrative:   Spoke with [facility] CSW this AM who confirms that [facility] is no longer involved, and that patient will go to [facility] for LTC from the hospital.  Left a message for Otila Kluver this AM asking her to confirm that they have a bed for patient today.  Awaiting call back. TOC will continue to follow during the course of hospitalization.  Spoke with guardian.  Spoke with Ms Otilio Saber at [facility] who confirmed their ability to admit patient today.  MD notified.  FL2 and d/c summary FAXed. PTAR arranged.  Nursing, please call report to [phone], room [phone]. Ask for Ms [place].  TOC sign off.     Final next level of care: Skilled Nursing Facility Barriers to Discharge: Barriers Resolved   Patient Goals and CMS Choice        Discharge Placement                       Discharge Plan and Services                                     Social Determinants of Health (SDOH) Interventions     Readmission Risk Interventions Readmission Risk Prevention Plan 10/05/2019 08/07/2019 06/06/2019  Transportation Screening Complete Complete Complete  HRI or Home Care Consult - Not Complete -  HRI or Home Care Consult comments - needs SNF -  Social Work Consult for [place] Planning/Counseling - Complete -  Palliative Care Screening - Not Applicable -  Medication Review Press photographer) Complete Complete Complete  PCP or Specialist appointment within 3-5 days of discharge Complete - -  [place] or Home Care Consult Complete - Complete  SW Recovery Care/Counseling Consult Complete - Complete  Palliative Care Screening Not Applicable - Not [place] Not Applicable - Complete

## 2019-11-13 NOTE — Progress Notes (Addendum)
TOC CM spoke to attending, Dr Laurena Bering. Orders per attending place Sofosbuvir-Velpatasvir on hold at [facility]. Pt will need to follow up with Gastroenterologist outpatient. Per SNF medications is $10000 and their facility is not able to cover that outpatient medication. Updated SNF, spoke to [place].Jonnie Finner RN CCM, [facility] ED TOC CM [phone]  Spoke to [place] at [facility] and sister is going to bring her medications from home, 30 day supply. The SNF pharmacist will allow pt to use her outpatient medications. Explained to [place] the facility will need to follow up with Gastroenterologist for continued instructions on medications. Updated attending. [place], [facility] TOC CM [phone]

## 2019-11-13 NOTE — Discharge Summary (Signed)
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 duration of the COVID-19 declaration under Section 564(b)(1) of the Act, 21 U.S.C. section 360bbb-3(b)(1), unless the authorization is terminated or revoked sooner.     Influenza A by PCR NEGATIVE NEGATIVE Final   Influenza B by PCR NEGATIVE NEGATIVE Final    Comment: (NOTE) The Xpert Xpress SARS-CoV-2/FLU/RSV assay is intended as an aid in  the diagnosis of influenza from Nasopharyngeal swab specimens and  should not be used as a sole basis for treatment. Nasal washings and  aspirates are unacceptable for Xpert Xpress SARS-CoV-2/FLU/RSV  testing.  Fact Sheet for  Patients: PinkCheek.be  Fact Sheet for Healthcare Providers: [employer]  This test is not yet approved or cleared by the [place] FDA and  has been authorized for detection and/or diagnosis of SARS-CoV-2 by  FDA under an Emergency Use Authorization (EUA). This EUA will remain  in effect (meaning this test can be used) for the duration of the  Covid-19 declaration under Section 564(b)(1) of the Act, 21  U.S.C. section 360bbb-3(b)(1), unless the authorization is  terminated or revoked. Performed at [facility], [place] [address] [place], [place] [address]   Urine Culture     Status: None   Collection Time: 11/11/19  5:42 PM   Specimen: Urine, Random  Result Value  Ref Range Status   Specimen Description   Final    URINE, RANDOM Performed at [place] [address] [place], [place] [address]    Special Requests   Final    NONE Performed at [facility] [place] [facility], [place] [address] [place], [place] [address]    Culture   Final    NO GROWTH Performed at [facility], [place] [address], [place], [place] [address]    Report Status 11/13/2019 FINAL  Final     Time coordinating discharge:  40 minutes  SIGNED:   Barb Merino, MD  [facility] 11/13/2019, 11:23 AM

## 2019-11-13 NOTE — NC FL2 (Signed)
[place] LEVEL OF CARE SCREENING TOOL     IDENTIFICATION  Patient Name: Judy Meza Birthdate: 09/23/1960 Sex: female Admission Date (Current Location): 11/11/2019  [facility] and [place] Number:  Herbalist and Address:  [facility],  [place] [address], [place]      Provider Number: [id number]  Attending Physician Name and Address:  Barb Merino, MD  Relative Name and Phone Number:  Charisse Klinefelter (Sister) [phone]    Current Level of Care: Hospital Recommended Level of Care: [place] Prior Approval Number:    Date Approved/Denied:   PASRR Number: [id number]  Discharge Plan: SNF ([facility])    Current Diagnoses: Patient Active Problem List   Diagnosis Date Noted  . End stage liver disease ([place]) 11/11/2019  . Secondary esophageal varices with bleeding ([place])   . Esophageal stricture   . Cirrhosis ([place])   . Decompensated hepatic cirrhosis ([place]) 10/03/2019  . Acute on chronic anemia 10/03/2019  . Cancer screening 08/14/2019  . Back pain with radiculopathy 08/07/2019  . Recurrent falls 08/04/2019  . Multiple falls at home 08/03/2019  . 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 Goals of care, counseling/discussion 07/28/2019  . Acute encephalopathy 06/05/2019  . Chest pain 06/04/2019  . QT prolongation 06/04/2019  . Metabolic acidosis 64/68/0321  . Dementia without behavioral disturbance ([place]) 05/10/2019  . Chronic hepatitis C ([place]) 05/10/2019  . Hepatic encephalopathy ([place]) 05/09/2019  . Pancytopenia, acquired ([place])   . Cirrhosis of liver ([place])   . CKD (chronic kidney disease), stage III ([place]) 04/29/2019  . COPD with chronic bronchitis ([place]) 04/29/2019  . Tobacco abuse 04/29/2019  . Alcoholism in remission ([place]) 04/29/2019  . Acute hepatic encephalopathy 04/29/2019  . AKI (acute kidney injury) ([place]) 04/29/2019  . Alcoholic cirrhosis of liver with ascites ([place]) 04/29/2019  .  Thrombocytopenia ([place]) 04/29/2019  . Hyperkalemia 04/29/2019    Orientation RESPIRATION BLADDER Height & Weight     Self, Situation, Place  Normal External catheter Weight:   Height:     BEHAVIORAL SYMPTOMS/MOOD NEUROLOGICAL BOWEL NUTRITION STATUS   (none)  (none) Incontinent Diet (see d/c summary)  AMBULATORY STATUS COMMUNICATION OF NEEDS Skin   Extensive Assist Verbally Normal                       Personal Care Assistance Level of Assistance  Bathing, Feeding, Dressing Bathing Assistance: Maximum assistance Feeding assistance: Limited assistance Dressing Assistance: Maximum assistance     Functional Limitations Info  Sight, Hearing, Speech Sight Info: Adequate Hearing Info: Adequate Speech Info: Adequate    SPECIAL CARE FACTORS FREQUENCY                       Contractures Contractures Info: Present    Additional Factors Info  Code Status Code Status Info: DNR Allergies Info: NKDA           Current Medications (11/13/2019):  This is the current hospital active medication list Current Facility-Administered Medications  Medication Dose Route Frequency Provider Last Rate Last Admin  . albuterol (PROVENTIL) (2.5 MG/3ML) 0.083% nebulizer solution 2.5 mg  2.5 mg Nebulization Q6H PRN Etta Quill, DO      . famotidine (PEPCID) tablet 20 mg  20 mg Oral Daily Jennette Kettle M, DO   20 mg at 11/13/19 0855  . feeding supplement (ENSURE ENLIVE / ENSURE PLUS) liquid 237 mL  237 mL Oral BID BM Etta Quill, DO  237 mL at 11/13/19 0900  . folic acid (FOLVITE) tablet 1 mg  1 mg Oral Daily Jennette Kettle M, DO   1 mg at 11/13/19 0856  . gabapentin (NEURONTIN) capsule 200 mg  200 mg Oral TID Etta Quill, DO   200 mg at 11/13/19 0855  . hydrocerin (EUCERIN) cream   Topical BID Barb Merino, MD   1 application at 90/38/33 0859  . hydrOXYzine (ATARAX/VISTARIL) tablet 25 mg  25 mg Oral Q6H PRN Etta Quill, DO   25 mg at 11/13/19 0857  . lactulose  (CHRONULAC) 10 GM/15ML solution 20 g  20 g Oral TID Etta Quill, DO   20 g at 11/13/19 0853  . loratadine (CLARITIN) tablet 10 mg  10 mg Oral Daily Jennette Kettle M, DO   10 mg at 11/13/19 0857  . nadolol (CORGARD) tablet 40 mg  40 mg Oral Daily Barb Merino, MD   40 mg at 11/13/19 0905  . olopatadine (PATANOL) 0.1 % ophthalmic solution 1 drop  1 drop Both Eyes Daily PRN Etta Quill, DO      . PARoxetine (PAXIL) tablet 40 mg  40 mg Oral Daily Jennette Kettle M, DO   40 mg at 11/13/19 0853  . rifaximin (XIFAXAN) tablet 550 mg  550 mg Oral BID Etta Quill, DO   550 mg at 11/13/19 0854  . sodium bicarbonate tablet 650 mg  650 mg Oral BID Etta Quill, DO   650 mg at 11/13/19 0858  . Sofosbuvir-Velpatasvir 400-100 MG TABS 1 tablet  1 tablet Oral Q supper Minda Ditto, RPH   1 tablet at 11/12/19 1646  . thiamine tablet 100 mg  100 mg Oral Daily Jennette Kettle M, DO   100 mg at 11/13/19 3832     Discharge Medications: Please see discharge summary for a list of discharge medications.  Relevant Imaging Results:  Relevant Lab Results:   Additional Information SSN [id number]  Trish Mage, [place]

## 2019-11-17 ENCOUNTER — Non-Acute Institutional Stay: Payer: Medicaid Other | Admitting: Hospice

## 2019-11-17 ENCOUNTER — Other Ambulatory Visit: Payer: Self-pay

## 2019-11-17 DIAGNOSIS — Z515 Encounter for palliative care: Secondary | ICD-10-CM

## 2019-11-17 DIAGNOSIS — K7031 Alcoholic cirrhosis of liver with ascites: Secondary | ICD-10-CM

## 2019-11-17 NOTE — Progress Notes (Signed)
[facility] Palliative Care Consult Note Telephone: [phone]  Fax: [phone]  PATIENT NAME: Judy Meza [id number] [place] [address] [phone] (home)  DOB: 1960-11-29 MRN: [id number]  PRIMARY CARE PROVIDER:    Waylan Rocher, MD,  [place] [address] [phone]  REFERRING PROVIDER:   Dr. Caprice Renshaw  RESPONSIBLE PARTY:  Guardian of the person: - sister- Charisse Klinefelter  Extended Emergency Contact Information Primary Emergency Contact: Alisia Ferrari, [place] Home Phone: [phone] Mobile Phone: [phone] Relation: Sister Secondary Emergency Contact: [facility] Phone: [phone] Mobile Phone: [phone] Relation: Sister  I met face to face with patient and family in home/facility.  RECOMMENDATIONS/PLAN:    Visit at the request of Dr. Caprice Renshaw  for palliative consult. Visit consisted of building trust and discussions on Palliative Medicine as specialized medical care for people living with serious illness, aimed at facilitating better quality of life through symptoms relief, assisting with advance care plan and establishing goals of care.   Discussion on the difference between Palliative and Hospice care. Palliative care and hospice have similar goals of managing symptoms, promoting comfort, improving quality of life, and maintaining a person's dignity. However, palliative care may be offered during any phase of a serious illness, while hospice care is usually offered when a person is expected to live for 6 months or less.  Advance Care Planning: Our advance care planning conversation included a discussion about:    The value and importance of advance care planning  Experiences with loved ones who have been seriously ill or have died  Exploration of personal, cultural or spiritual beliefs that might influence medical decisions  Exploration of goals  of care in the event of a sudden injury or illness  Identification and preparation of a healthcare agent  Review and updating or creation of an  advance directive document  CODE STATUS: CODE STATUS reviewed today.  Patient is a DO NOT RESUSCITATE.  NP signed a DNR form in facility record; same document uploaded to epic today.  GOALS OF CARE: Goals of care include to maximize quality of life and symptom management.  Patient has a MOST form in facility record; selections include DO NOT RESUSCITATE, comfort measures, no antibiotics, no IV fluids, no feeding tube.  NP called and had extensive discussion with her sister [name] - Legal Guardian-; she affirmed MOST selections above.  She said patient has had 6 hospitalizations in the past 6 months.  Family is open to Hospice service when patient is eligible.   Follow up Palliative Care Visit: Palliative care will continue to follow for goals of care clarification and symptom management.   Symptom Management: Patient denies pain/discomfort, no respiratory distress. She is compliant with her medications.  Nursing with no concern encouraged ongoing care Palliative will continue to monitor for symptom management/decline and make recommendations as needed.  I spent 1 hour and 46 minutes providing this initial consultation; time includes time spent with patient/family, chart review, provider coordination,  and documentation. More than 50% of the time in this consultation was spent on coordinating communication  CHIEF COMPLAIN/HISTORY OF PRESENT ILLNESS:  Judy Meza is a 59 y.o. female with multiple medical problems including COPD, viral Hep C, Liver Cirrhosis.  [name] reports a history of throat cancer, in remission. Palliative Care was asked to follow this patient by consultation request of Dr. Caprice Renshaw  to help address advance care  planning and goals of care.  CODE STATUS: DNR  PPS: 60%  HOSPICE ELIGIBILITY/DIAGNOSIS: TBD  PAST MEDICAL  HISTORY:  Past Medical History:  Diagnosis Date  . Cancer ([place])   . COPD (chronic obstructive pulmonary disease) ([place])   . 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 Hypertension   . Liver disease   . Renal disorder     SOCIAL HX:  Social History   Tobacco Use  . Smoking status: Former Smoker    Packs/day: 0.25    Types: Cigarettes    Quit date: 04/03/2019    Years since quitting: 0.6  . Smokeless tobacco: Never Used  Substance Use Topics  . Alcohol use: Not Currently   FAMILY HX:  Family History  Problem Relation Age of Onset  . Healthy Mother   . Hypertension Other     ALLERGIES: No Known Allergies   PERTINENT MEDICATIONS:  Outpatient Encounter Medications as of 11/17/2019  Medication Sig  . albuterol (PROVENTIL) (2.5 MG/3ML) 0.083% nebulizer solution Take 3 mLs (2.5 mg total) by nebulization every 6 (six) hours as needed for wheezing or shortness of breath.  . ergocalciferol (VITAMIN D2) 1.25 MG (50000 UT) capsule Take 50,000 Units by mouth once a week. Fridays  . famotidine (PEPCID) 20 MG tablet Take 20 mg by mouth daily.  . feeding supplement, ENSURE ENLIVE, (ENSURE ENLIVE) LIQD Take 237 mLs by mouth 2 (two) times daily between meals.  . folic acid (FOLVITE) 1 MG tablet Take 1 tablet (1 mg total) by mouth daily.  . furosemide (LASIX) 20 MG tablet Take 2 tablets (40 mg total) by mouth every Monday, Wednesday, and Friday.  . gabapentin (NEURONTIN) 100 MG capsule Take 200 mg by mouth 3 (three) times daily.  . hydrOXYzine (ATARAX/VISTARIL) 25 MG tablet Take 25 mg by mouth every 6 (six) hours as needed for anxiety or itching.  . lactulose (CHRONULAC) 10 GM/15ML solution Take 30 mLs (20 g total) by mouth 3 (three) times daily.  Marland Kitchen loratadine (CLARITIN) 10 MG tablet Take 1 tablet (10 mg total) by mouth daily.  . nadolol (CORGARD) 40 MG tablet Take 1 tablet (40 mg total) by mouth daily.  . Olopatadine HCl (PATADAY) 0.2 % SOLN Place 1 drop into both eyes daily as needed  (allergic conjunctivitis).  Marland Kitchen PARoxetine (PAXIL) 40 MG tablet Take 1 tablet (40 mg total) by mouth every morning.  . rifaximin (XIFAXAN) 550 MG TABS tablet Take 1 tablet (550 mg total) by mouth 2 (two) times daily.  . Skin Protectants, Misc. (EUCERIN) cream Apply 1 application topically daily as needed for dry skin.   . sodium bicarbonate 650 MG tablet Take 1 tablet (650 mg total) by mouth 2 (two) times daily.  . Sofosbuvir-Velpatasvir (EPCLUSA) 400-100 MG TABS Take 1 tablet by mouth daily.  Marland Kitchen spironolactone (ALDACTONE) 100 MG tablet Take 1 tablet (100 mg total) by mouth every Monday, Wednesday, and Friday.  . thiamine 100 MG tablet Take 1 tablet (100 mg total) by mouth daily.   No facility-administered encounter medications on file as of 11/17/2019.    PHYSICAL EXAM / ROS: Height:  61 inches     Weight:132.8 Lbs General: In no acute distress Cardiovascular: regular rate and rhythm, no chest pain reported Pulmonary: no cough, no shortness of breath, clear ant/post fields, normal respiratory effort Abdomen: soft, distended, non tender, positive bowel sounds in all quadrants GU: denies dysuria, no suprapubic tenderness Extremities: no edema, no joint deformities Skin: report of trach stoma in  throat; dressing is clean dry and intact;  no rashes to exposed skin Neurological: Weakness but otherwise non focal  Teodoro Spray, NP

## 2019-11-20 ENCOUNTER — Encounter: Payer: Self-pay | Admitting: Internal Medicine

## 2019-11-20 ENCOUNTER — Other Ambulatory Visit: Payer: Self-pay

## 2019-11-20 ENCOUNTER — Ambulatory Visit (INDEPENDENT_AMBULATORY_CARE_PROVIDER_SITE_OTHER): Payer: Medicaid Other | Admitting: Internal Medicine

## 2019-11-20 ENCOUNTER — Other Ambulatory Visit (HOSPITAL_COMMUNITY)
Admission: RE | Admit: 2019-11-20 | Discharge: 2019-11-20 | Disposition: A | Discharge status: Home / Self Care | Payer: Medicaid Other | Source: Ambulatory Visit | Attending: Gastroenterology | Admitting: Gastroenterology

## 2019-11-20 ENCOUNTER — Encounter (HOSPITAL_COMMUNITY): Payer: Self-pay | Admitting: Gastroenterology

## 2019-11-20 VITALS — BP 100/66 | HR 69 | Temp 97.9°F | Ht 61.0 in | Wt 131.0 lb

## 2019-11-20 DIAGNOSIS — K7030 Alcoholic cirrhosis of liver without ascites: Secondary | ICD-10-CM | POA: Diagnosis not present

## 2019-11-20 DIAGNOSIS — Z01812 Encounter for preprocedural laboratory examination: Secondary | ICD-10-CM | POA: Diagnosis not present

## 2019-11-20 DIAGNOSIS — I8511 Secondary esophageal varices with bleeding: Secondary | ICD-10-CM | POA: Diagnosis not present

## 2019-11-20 DIAGNOSIS — B182 Chronic viral hepatitis C: Secondary | ICD-10-CM

## 2019-11-20 DIAGNOSIS — K7031 Alcoholic cirrhosis of liver with ascites: Secondary | ICD-10-CM

## 2019-11-20 DIAGNOSIS — U071 COVID-19: Secondary | ICD-10-CM | POA: Diagnosis not present

## 2019-11-20 LAB — SARS CORONAVIRUS 2 (TAT 6-24 HRS): SARS Coronavirus 2: POSITIVE — AB

## 2019-11-20 NOTE — Progress Notes (Addendum)
Preop instructions for: Judy Meza  Date of Birth:  05/14/1960                     Date of Procedure:   11/22/19 Procedure:     ESOPHAGOGASTRODUODENOSCOPY (EGD) WITH PROPOFOL Surgeon: Dr. Gerrit Heck Facility contact: [facility]     Phone:  [place]: RN contact name/phone#: Judy Meza                         and Fax #: [phone]   Transportation contact phone#: Judy Meza (sister) [phone]   [facility] at [facility]: 1030 AM   Report to: Admitting (On your left hand side)    Do not eat or drink past midnight the night before your procedure.(To include any tube feedings-must be discontinued)   Take these morning medications only with sips of water.(or give through gastrostomy or feeding tube). Famotidine, Gabapentin, Nadolol, Paroxetine, Rifaximin, Sodium Bicarb, Epclusa, Hydroxyzine if needed  Note: No Insulin or Diabetic meds should be given or taken the morning of the procedure!     Please send day of procedure:current med list and meds last taken that day, confirm nothing by mouth status from what time, Patient Demographic info( to include DNR status, problem list, allergies)   Bring Insurance card and picture ID Leave all jewelry and other valuables at place where living( no metal or rings to be worn) No contact lens Women-no make-up, no lotions,perfumes,powders    Any questions day of procedure,call  SHORT STAY-[phone]     Sent from :[facility]                   Phone:[phone]                   Fax:[phone]   Sent by :   Judy Meza BSN,  RN

## 2019-11-20 NOTE — Progress Notes (Signed)
   Subjective:    Patient ID: Judy Meza, female    DOB: 1960-01-21, 59 y.o.   MRN: [id number]  HPI Here for follow up of chronic hepatitis C and decompensated cirrhosis.   She has had hepatic encephalopathy and is completing 16months of Epclusa for her chronic hepatitis C.  She is also now followed by palliative medicine.  Here withher sister.     Review of Systems  Constitutional: Negative for fatigue.  Skin: Negative for rash.       Objective:   Physical Exam Constitutional:      Appearance: Normal appearance.  Eyes:     General: No scleral icterus. Pulmonary:     Effort: Pulmonary effort is normal.  Neurological:     Mental Status: She is alert.  Psychiatric:        Mood and Affect: Mood normal.   SH: no current alcohol        Assessment & Plan:

## 2019-11-21 ENCOUNTER — Encounter: Payer: Self-pay | Admitting: Internal Medicine

## 2019-11-21 ENCOUNTER — Telehealth: Payer: Self-pay | Admitting: *Deleted

## 2019-11-21 DIAGNOSIS — U071 COVID-19: Secondary | ICD-10-CM | POA: Diagnosis not present

## 2019-11-21 DIAGNOSIS — K7290 Hepatic failure, unspecified without coma: Secondary | ICD-10-CM | POA: Diagnosis not present

## 2019-11-21 NOTE — Assessment & Plan Note (Signed)
She is completing her treatment and will check her hepatitis C RNA now and in 6 months.  Will have it done by [facility] since they are following her labs regularly.

## 2019-11-21 NOTE — Telephone Encounter (Signed)
Sister [name] called to let Dr Linus Salmons know that [facility] tested positive for covid yesterday after her [facility] appointment.  She went for preop testing after her [facility] appointment. Family was notified of the result this morning, are getting tested themselves.  Patient had no symptoms.  Landis Gandy, RN

## 2019-11-21 NOTE — Telephone Encounter (Signed)
thanks

## 2019-11-21 NOTE — Assessment & Plan Note (Signed)
She  Is scheduled for a follow up EGD by GI

## 2019-11-21 NOTE — Progress Notes (Signed)
Spoke with Dr. Vivia Ewing office,  Left message for his nurse regarding positive covid test.

## 2019-11-21 NOTE — Assessment & Plan Note (Signed)
She is due for [facility] screening and will continue every 6 months.

## 2019-11-22 ENCOUNTER — Ambulatory Visit (HOSPITAL_COMMUNITY): Admission: RE | Admit: 2019-11-22 | Payer: Medicaid Other | Source: Home / Self Care | Admitting: Gastroenterology

## 2019-11-22 HISTORY — DX: Unspecified cirrhosis of liver: K74.60

## 2019-11-22 HISTORY — DX: (Idiopathic) normal pressure hydrocephalus: G91.2

## 2019-11-22 HISTORY — DX: Atherosclerotic heart disease of native coronary artery without angina pectoris: I25.10

## 2019-11-22 HISTORY — DX: Sleep apnea, unspecified: G47.30

## 2019-11-22 HISTORY — DX: Chronic viral hepatitis C: B18.2

## 2019-11-22 SURGERY — ESOPHAGOGASTRODUODENOSCOPY (EGD) WITH PROPOFOL
Anesthesia: Monitor Anesthesia Care

## 2019-11-23 DIAGNOSIS — U071 COVID-19: Secondary | ICD-10-CM | POA: Diagnosis not present

## 2019-11-23 DIAGNOSIS — B182 Chronic viral hepatitis C: Secondary | ICD-10-CM | POA: Diagnosis not present

## 2019-11-24 DIAGNOSIS — U071 COVID-19: Secondary | ICD-10-CM | POA: Diagnosis not present

## 2019-11-24 DIAGNOSIS — F0390 Unspecified dementia without behavioral disturbance: Secondary | ICD-10-CM | POA: Diagnosis not present

## 2019-11-24 DIAGNOSIS — N183 Chronic kidney disease, stage 3 unspecified: Secondary | ICD-10-CM | POA: Diagnosis not present

## 2019-11-24 DIAGNOSIS — J449 Chronic obstructive pulmonary disease, unspecified: Secondary | ICD-10-CM | POA: Diagnosis not present

## 2019-11-24 DIAGNOSIS — K7290 Hepatic failure, unspecified without coma: Secondary | ICD-10-CM | POA: Diagnosis not present

## 2019-11-24 DIAGNOSIS — B182 Chronic viral hepatitis C: Secondary | ICD-10-CM | POA: Diagnosis not present

## 2019-11-27 DIAGNOSIS — B182 Chronic viral hepatitis C: Secondary | ICD-10-CM | POA: Diagnosis not present

## 2019-11-30 ENCOUNTER — Other Ambulatory Visit: Payer: Medicaid Other

## 2019-11-30 DIAGNOSIS — B182 Chronic viral hepatitis C: Secondary | ICD-10-CM | POA: Diagnosis not present

## 2019-12-01 DIAGNOSIS — B182 Chronic viral hepatitis C: Secondary | ICD-10-CM | POA: Diagnosis not present

## 2019-12-01 DIAGNOSIS — U071 COVID-19: Secondary | ICD-10-CM | POA: Diagnosis not present

## 2019-12-01 DIAGNOSIS — J449 Chronic obstructive pulmonary disease, unspecified: Secondary | ICD-10-CM | POA: Diagnosis not present

## 2019-12-01 DIAGNOSIS — F0390 Unspecified dementia without behavioral disturbance: Secondary | ICD-10-CM | POA: Diagnosis not present

## 2019-12-01 DIAGNOSIS — N183 Chronic kidney disease, stage 3 unspecified: Secondary | ICD-10-CM | POA: Diagnosis not present

## 2019-12-01 DIAGNOSIS — K7290 Hepatic failure, unspecified without coma: Secondary | ICD-10-CM | POA: Diagnosis not present

## 2019-12-04 DIAGNOSIS — B182 Chronic viral hepatitis C: Secondary | ICD-10-CM | POA: Diagnosis not present

## 2019-12-07 DIAGNOSIS — B182 Chronic viral hepatitis C: Secondary | ICD-10-CM | POA: Diagnosis not present

## 2019-12-11 ENCOUNTER — Telehealth: Payer: Self-pay | Admitting: Gastroenterology

## 2019-12-11 DIAGNOSIS — K7290 Hepatic failure, unspecified without coma: Secondary | ICD-10-CM | POA: Diagnosis not present

## 2019-12-11 DIAGNOSIS — J9509 Other tracheostomy complication: Secondary | ICD-10-CM | POA: Diagnosis not present

## 2019-12-11 DIAGNOSIS — F0390 Unspecified dementia without behavioral disturbance: Secondary | ICD-10-CM | POA: Diagnosis not present

## 2019-12-11 DIAGNOSIS — J449 Chronic obstructive pulmonary disease, unspecified: Secondary | ICD-10-CM | POA: Diagnosis not present

## 2019-12-11 DIAGNOSIS — B182 Chronic viral hepatitis C: Secondary | ICD-10-CM | POA: Diagnosis not present

## 2019-12-11 DIAGNOSIS — N183 Chronic kidney disease, stage 3 unspecified: Secondary | ICD-10-CM | POA: Diagnosis not present

## 2019-12-11 NOTE — Telephone Encounter (Signed)
Pt's sister is requesting a call back from a nurse to reschedule the procedure that was scheduled at the hospital for 11/10.

## 2019-12-11 NOTE — Telephone Encounter (Signed)
Left a voicemail for patients sister to call back regarding rescheduling a procedure from 11/2019

## 2019-12-13 DIAGNOSIS — B179 Acute viral hepatitis, unspecified: Secondary | ICD-10-CM | POA: Diagnosis not present

## 2019-12-13 DIAGNOSIS — F0390 Unspecified dementia without behavioral disturbance: Secondary | ICD-10-CM | POA: Diagnosis not present

## 2019-12-13 DIAGNOSIS — R945 Abnormal results of liver function studies: Secondary | ICD-10-CM | POA: Diagnosis not present

## 2019-12-13 DIAGNOSIS — U071 COVID-19: Secondary | ICD-10-CM | POA: Diagnosis not present

## 2019-12-13 DIAGNOSIS — D61818 Other pancytopenia: Secondary | ICD-10-CM | POA: Diagnosis not present

## 2019-12-13 DIAGNOSIS — D696 Thrombocytopenia, unspecified: Secondary | ICD-10-CM | POA: Diagnosis not present

## 2019-12-13 DIAGNOSIS — J449 Chronic obstructive pulmonary disease, unspecified: Secondary | ICD-10-CM | POA: Diagnosis not present

## 2019-12-13 DIAGNOSIS — B182 Chronic viral hepatitis C: Secondary | ICD-10-CM | POA: Diagnosis not present

## 2019-12-13 DIAGNOSIS — N183 Chronic kidney disease, stage 3 unspecified: Secondary | ICD-10-CM | POA: Diagnosis not present

## 2019-12-13 DIAGNOSIS — K7290 Hepatic failure, unspecified without coma: Secondary | ICD-10-CM | POA: Diagnosis not present

## 2019-12-13 DIAGNOSIS — N179 Acute kidney failure, unspecified: Secondary | ICD-10-CM | POA: Diagnosis not present

## 2019-12-13 DIAGNOSIS — K7460 Unspecified cirrhosis of liver: Secondary | ICD-10-CM | POA: Diagnosis not present

## 2019-12-14 DIAGNOSIS — B182 Chronic viral hepatitis C: Secondary | ICD-10-CM | POA: Diagnosis not present

## 2019-12-19 DIAGNOSIS — B182 Chronic viral hepatitis C: Secondary | ICD-10-CM | POA: Diagnosis not present

## 2019-12-19 NOTE — Telephone Encounter (Signed)
Judy Meza called back to schedule for [facility]. Offered an appointment at [facility] on 02/05/2020. They are unable to do that date do to caretaker issues. They will shoot for February 2022 for her procedure. Judy Meza will call back by the 2nd week of January if she does not hear from me.

## 2019-12-19 NOTE — Telephone Encounter (Signed)
Tried to contact the patients sister and phone rang busy. Will try again.

## 2019-12-21 DIAGNOSIS — B182 Chronic viral hepatitis C: Secondary | ICD-10-CM | POA: Diagnosis not present

## 2019-12-25 DIAGNOSIS — B182 Chronic viral hepatitis C: Secondary | ICD-10-CM | POA: Diagnosis not present

## 2019-12-28 DIAGNOSIS — U071 COVID-19: Secondary | ICD-10-CM | POA: Diagnosis not present

## 2020-01-01 DIAGNOSIS — U071 COVID-19: Secondary | ICD-10-CM | POA: Diagnosis not present

## 2020-01-03 DIAGNOSIS — K7030 Alcoholic cirrhosis of liver without ascites: Secondary | ICD-10-CM | POA: Diagnosis not present

## 2020-01-08 DIAGNOSIS — U071 COVID-19: Secondary | ICD-10-CM | POA: Diagnosis not present

## 2020-01-09 ENCOUNTER — Telehealth: Payer: Self-pay | Admitting: *Deleted

## 2020-01-09 DIAGNOSIS — K7290 Hepatic failure, unspecified without coma: Secondary | ICD-10-CM | POA: Diagnosis not present

## 2020-01-09 DIAGNOSIS — F419 Anxiety disorder, unspecified: Secondary | ICD-10-CM | POA: Diagnosis not present

## 2020-01-09 NOTE — Telephone Encounter (Signed)
Bridgette calling from [facility]. Patient has completed 6 month treatment of Hep C with Sofosbuvir-Velpatasvir (Epclusa) and [facility] needs written order to discontinue this medication, needs faxed to [phone]. RN followed up on expected labs and abdominal ultrasounds ordered by Dr Linus Salmons at last visit, to be completed by [facility].  RN will fax written order for discontinuation of epclusa and previous written order for labs/imaging to [facility]. Landis Gandy, RN

## 2020-01-09 NOTE — Telephone Encounter (Signed)
I am having a hard time following her treatment trail. Dr. Linus Salmons has been following her regularly with appointment follow ups, so I will defer to him.

## 2020-01-10 DIAGNOSIS — I10 Essential (primary) hypertension: Secondary | ICD-10-CM | POA: Diagnosis not present

## 2020-01-10 NOTE — Telephone Encounter (Signed)
Spoke with Cassie and with patient's sister. Able to confirm epclusa start of 06/02/19.  Patient has had more than 6 months of treatment.  Cassie signed order to discontinue epclusa, RN faxed to facility. Landis Gandy, RN

## 2020-01-11 DIAGNOSIS — U071 COVID-19: Secondary | ICD-10-CM | POA: Diagnosis not present

## 2020-01-13 DIAGNOSIS — U071 COVID-19: Secondary | ICD-10-CM | POA: Diagnosis not present

## 2020-01-13 DIAGNOSIS — F0390 Unspecified dementia without behavioral disturbance: Secondary | ICD-10-CM | POA: Diagnosis not present

## 2020-01-13 DIAGNOSIS — B179 Acute viral hepatitis, unspecified: Secondary | ICD-10-CM | POA: Diagnosis not present

## 2020-01-13 DIAGNOSIS — R945 Abnormal results of liver function studies: Secondary | ICD-10-CM | POA: Diagnosis not present

## 2020-01-13 DIAGNOSIS — D696 Thrombocytopenia, unspecified: Secondary | ICD-10-CM | POA: Diagnosis not present

## 2020-01-13 DIAGNOSIS — N183 Chronic kidney disease, stage 3 unspecified: Secondary | ICD-10-CM | POA: Diagnosis not present

## 2020-01-13 DIAGNOSIS — K7290 Hepatic failure, unspecified without coma: Secondary | ICD-10-CM | POA: Diagnosis not present

## 2020-01-13 DIAGNOSIS — K7460 Unspecified cirrhosis of liver: Secondary | ICD-10-CM | POA: Diagnosis not present

## 2020-01-13 DIAGNOSIS — B182 Chronic viral hepatitis C: Secondary | ICD-10-CM | POA: Diagnosis not present

## 2020-01-13 DIAGNOSIS — N179 Acute kidney failure, unspecified: Secondary | ICD-10-CM | POA: Diagnosis not present

## 2020-01-13 DIAGNOSIS — D61818 Other pancytopenia: Secondary | ICD-10-CM | POA: Diagnosis not present

## 2020-01-13 DIAGNOSIS — J449 Chronic obstructive pulmonary disease, unspecified: Secondary | ICD-10-CM | POA: Diagnosis not present

## 2020-01-15 DIAGNOSIS — U071 COVID-19: Secondary | ICD-10-CM | POA: Diagnosis not present

## 2020-01-16 NOTE — Telephone Encounter (Signed)
Yes, thanks

## 2020-01-22 ENCOUNTER — Encounter: Payer: Self-pay | Admitting: Gastroenterology

## 2020-01-22 ENCOUNTER — Ambulatory Visit (INDEPENDENT_AMBULATORY_CARE_PROVIDER_SITE_OTHER): Payer: Medicaid Other | Admitting: Gastroenterology

## 2020-01-22 VITALS — BP 134/82 | HR 95 | Ht 61.0 in | Wt 142.1 lb

## 2020-01-22 DIAGNOSIS — K7682 Hepatic encephalopathy: Secondary | ICD-10-CM

## 2020-01-22 DIAGNOSIS — I8510 Secondary esophageal varices without bleeding: Secondary | ICD-10-CM

## 2020-01-22 DIAGNOSIS — K7290 Hepatic failure, unspecified without coma: Secondary | ICD-10-CM | POA: Diagnosis not present

## 2020-01-22 DIAGNOSIS — Z8619 Personal history of other infectious and parasitic diseases: Secondary | ICD-10-CM

## 2020-01-22 DIAGNOSIS — K7460 Unspecified cirrhosis of liver: Secondary | ICD-10-CM

## 2020-01-22 DIAGNOSIS — K7031 Alcoholic cirrhosis of liver with ascites: Secondary | ICD-10-CM | POA: Diagnosis not present

## 2020-01-22 MED ORDER — NADOLOL 40 MG PO TABS: 40.0000 mg | ORAL_TABLET | Freq: Every day | ORAL | 5 refills | Status: DC

## 2020-01-22 MED ORDER — RIFAXIMIN 550 MG PO TABS: 550.0000 mg | ORAL_TABLET | Freq: Two times a day (BID) | ORAL | 5 refills | Status: AC
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
## 2020-01-22 NOTE — Patient Instructions (Signed)
If you are age 58 or older, your body mass index should be between 23-30. Your Body mass index is 26.85 kg/m. If this is out of the aforementioned range listed, please consider follow up with your Primary Care Provider.  If you are age 82 or younger, your body mass index should be between 19-25. Your Body mass index is 26.85 kg/m. If this is out of the aformentioned range listed, please consider follow up with your Primary Care Provider.   We have sent the following medications to your pharmacy for you to pick up at your convenience:  [facility]- [facility]  Due to recent changes in healthcare laws, you may see the results of your imaging and laboratory studies on MyChart before your provider has had a chance to review them.  We understand that in some cases there may be results that are confusing or concerning to you. Not all laboratory results come back in the same time frame and the provider may be waiting for multiple results in order to interpret others.  Please give [place] 48 hours in order for your provider to thoroughly review all the results before contacting the office for clarification of your results.   We have sent in a referral for palliative care. You will hear from them in regards to scheduling time to visit with you.  Thank you for choosing me and [facility].  Vito Cirigliano, D.O.

## 2020-01-23 DIAGNOSIS — U071 COVID-19: Secondary | ICD-10-CM | POA: Diagnosis not present

## 2020-01-24 DIAGNOSIS — Z79899 Other long term (current) drug therapy: Secondary | ICD-10-CM | POA: Diagnosis not present

## 2020-01-24 DIAGNOSIS — B182 Chronic viral hepatitis C: Secondary | ICD-10-CM | POA: Diagnosis not present

## 2020-01-24 DIAGNOSIS — F419 Anxiety disorder, unspecified: Secondary | ICD-10-CM | POA: Diagnosis not present

## 2020-01-24 DIAGNOSIS — F0390 Unspecified dementia without behavioral disturbance: Secondary | ICD-10-CM | POA: Diagnosis not present

## 2020-01-24 DIAGNOSIS — R7989 Other specified abnormal findings of blood chemistry: Secondary | ICD-10-CM | POA: Diagnosis not present

## 2020-01-24 DIAGNOSIS — U071 COVID-19: Secondary | ICD-10-CM | POA: Diagnosis not present

## 2020-01-24 DIAGNOSIS — J449 Chronic obstructive pulmonary disease, unspecified: Secondary | ICD-10-CM | POA: Diagnosis not present

## 2020-01-24 DIAGNOSIS — N183 Chronic kidney disease, stage 3 unspecified: Secondary | ICD-10-CM | POA: Diagnosis not present

## 2020-01-24 NOTE — Telephone Encounter (Signed)
Chose not to schedule at this time an EGD will continue with medication management an imaging studies

## 2020-01-25 DIAGNOSIS — U071 COVID-19: Secondary | ICD-10-CM | POA: Diagnosis not present

## 2020-01-26 ENCOUNTER — Non-Acute Institutional Stay: Payer: Medicaid Other | Admitting: Hospice

## 2020-01-26 ENCOUNTER — Other Ambulatory Visit: Payer: Self-pay

## 2020-01-26 DIAGNOSIS — Z515 Encounter for palliative care: Secondary | ICD-10-CM

## 2020-01-26 DIAGNOSIS — K7031 Alcoholic cirrhosis of liver with ascites: Secondary | ICD-10-CM

## 2020-01-26 NOTE — Progress Notes (Signed)
[facility] Palliative Care Consult Note Telephone: [phone]  Fax: [phone]  PATIENT NAME: Judy Meza DOB: [birth year] MRN: [id number]  PRIMARY CARE PROVIDER:   Caprice Renshaw, MD Caprice Renshaw, [address],  [place] [address]  REFERRING PROVIDER: Caprice Renshaw, MD Caprice Renshaw, [address] [place],  [place] [address] PRIMARY CARE PROVIDER:    Waylan Rocher, MD,  [address] [place] [address] [phone]  REFERRING PROVIDER:   Dr. Caprice Renshaw  RESPONSIBLE PARTY:  Guardian of the person: - sister- Charisse Klinefelter  Extended Emergency Contact Information Primary Emergency Contact: Alisia Ferrari, [place] Home Phone: [phone] Mobile Phone: [phone] Relation: Sister Secondary Emergency Contact: [facility] Phone: [phone] Mobile Phone: [phone] Relation: Sister  I met face to face with patient and family in home/facility.  RECOMMENDATIONS/PLAN:    Visit consisted of building trust and discussions on Palliative Medicine as specialized medical care for people living with serious illness, aimed at facilitating better quality of life through symptoms relief, assisting with advance care plan and establishing goals of care.    Advance Care Planning/CODE STATUS: CODE STATUS reviewed today.  Patient is a DO NOT RESUSCITATE.    GOALS OF CARE: Goals of care include to maximize quality of life and symptom management.  Patient has a MOST form in facility record; selections include DO NOT RESUSCITATE, comfort measures, no antibiotics, no IV fluids, no feeding tube.  Family is open to Hospice service when patient is eligible.   Follow up Palliative Care Visit: Palliative care will continue to follow for goals of care clarification and symptom management. Follow up in 2 months/as needed  Symptom Management:She continues on Rifaximin for liver cirrhosis and Hydroxyzine  for itching.  Patient denies pain/discomfort, no respiratory distress.  No recent COPD exacerbation; patient has Albuterol prn. No fall or ED visit since last visit. She is compliant with her medications.  Nursing with no concern; encouraged ongoing care. Palliative will continue to monitor for symptom management/decline and make recommendations as needed.  I spent 46 mintes providing this consultation; time includes time spent with patient/family, chart review, provider coordination,  and documentation. More than 50% of the time in this consultation was spent on coordinating communication  CHIEF COMPLAIN/HISTORY OF PRESENT ILLNESS:  Judy Meza is a 60 y.o. female with multiple medical problems including COPD, viral Hep C, Liver Cirrhosis; history of throat cancer, in remission per [name]. Palliative Care was asked to follow this patient by consultation request of Dr. Caprice Renshaw  to help address advance care planning and goals of care.  CODE STATUS: DNR  PPS: 60%  HOSPICE ELIGIBILITY/DIAGNOSIS: TBD  PAST MEDICAL HISTORY:  Past Medical History:  Diagnosis Date  . Acute kidney failure ([place])   . Chronic hepatitis C ([place])   . Chronic kidney disease, stage 3 ([place])   . Cirrhosis ([place])   . COPD (chronic obstructive pulmonary disease) ([place])   . Coronary artery disease   . COVID-19 virus infection   . 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 Hypertension   . Liver disease   . Normal pressure hydrocephalus ([facility])   . Renal disorder   . Sleep apnea    uses O2 at night  . Thrombocytopenia ([place])   . Unspecified dementia without behavioral disturbance ([place])     SOCIAL HX:  Social History   Tobacco Use  . Smoking status: Current Every  Day Smoker    Packs/day: 0.10    Types: Cigarettes    Last attempt to quit: 04/03/2019    Years since quitting: 0.8  . Smokeless tobacco: Never Used  . Tobacco comment: 3 per day  Substance Use Topics  . Alcohol use: Not Currently     ALLERGIES: No Known Allergies   PERTINENT MEDICATIONS:  Outpatient Encounter Medications as of 01/26/2020  Medication Sig  . acetaminophen (TYLENOL) 325 MG tablet Take 650 mg by mouth every 4 (four) hours as needed.  Marland Kitchen albuterol (PROVENTIL) (2.5 MG/3ML) 0.083% nebulizer solution Take 3 mLs (2.5 mg total) by nebulization every 6 (six) hours as needed for wheezing or shortness of breath.  . Albuterol Sulfate (PROAIR RESPICLICK) 665 (90 Base) MCG/ACT AEPB Inhale 2 puffs into the lungs every 6 (six) hours as needed.  . diphenhydrAMINE-zinc acetate (BENADRYL ITCH STOPPING) cream Apply 1 application topically every 4 (four) hours as needed for itching.  . ergocalciferol (VITAMIN D2) 1.25 MG (50000 UT) capsule Take 50,000 Units by mouth once a week. Fridays  . famotidine (PEPCID) 20 MG tablet Take 20 mg by mouth daily.  . feeding supplement, ENSURE ENLIVE, (ENSURE ENLIVE) LIQD Take 237 mLs by mouth 2 (two) times daily between meals.  . folic acid (FOLVITE) 1 MG tablet Take 1 tablet (1 mg total) by mouth daily.  . furosemide (LASIX) 20 MG tablet Take 2 tablets (40 mg total) by mouth every Monday, Wednesday, and Friday.  . gabapentin (NEURONTIN) 100 MG capsule Take 200 mg by mouth 3 (three) times daily.  . heparin 5000 UNIT/ML injection 5,000 Units daily.  . hydrOXYzine (ATARAX/VISTARIL) 25 MG tablet Take 25 mg by mouth every 6 (six) hours as needed for anxiety or itching.  . lactulose (CHRONULAC) 10 GM/15ML solution Take 30 mLs (20 g total) by mouth 3 (three) times daily. (Patient taking differently: Take 40 g by mouth QID.)  . loratadine (CLARITIN) 10 MG tablet Take 1 tablet (10 mg total) by mouth daily.  . nadolol (CORGARD) 40 MG tablet Take 1 tablet (40 mg total) by mouth daily.  . nadolol (CORGARD) 40 MG tablet Take 1 tablet (40 mg total) by mouth daily.  . Olopatadine HCl 0.2 % SOLN Place 1 drop into both eyes daily as needed (allergic conjunctivitis).  Marland Kitchen PARoxetine (PAXIL) 40 MG tablet Take 1  tablet (40 mg total) by mouth every morning.  . rifaximin (XIFAXAN) 550 MG TABS tablet Take 1 tablet (550 mg total) by mouth 2 (two) times daily.  . Skin Protectants, Misc. (EUCERIN) cream Apply 1 application topically daily as needed for dry skin.   . sodium bicarbonate 650 MG tablet Take 1 tablet (650 mg total) by mouth 2 (two) times daily.  Marland Kitchen spironolactone (ALDACTONE) 100 MG tablet Take 1 tablet (100 mg total) by mouth every Monday, Wednesday, and Friday.  . thiamine 100 MG tablet Take 1 tablet (100 mg total) by mouth daily.   No facility-administered encounter medications on file as of 01/26/2020.    PHYSICAL EXAM/ROS:  General: NAD Cardiovascular: regular rate and rhythm; denies chest pain Pulmonary: normal respiratory effort; no shortness of breat Abdomen: soft, distended, nontender, + bowel sounds; no report of constipation or diarrhea Extremities: no edema, no joint deformities Skin: no rashes to exposed skin Neurological: Weakness but otherwise nonfocal Note:  Portions of this note were generated with Dragon dictation software. Dictation errors may occur despite attempts at proofreading.  Teodoro Spray, NP

## 2020-01-31 DIAGNOSIS — N1832 Chronic kidney disease, stage 3b: Secondary | ICD-10-CM | POA: Diagnosis not present

## 2020-02-12 DIAGNOSIS — E872 Acidosis: Secondary | ICD-10-CM | POA: Diagnosis not present

## 2020-02-12 DIAGNOSIS — N1832 Chronic kidney disease, stage 3b: Secondary | ICD-10-CM | POA: Diagnosis not present

## 2020-02-12 DIAGNOSIS — K7460 Unspecified cirrhosis of liver: Secondary | ICD-10-CM | POA: Diagnosis not present

## 2020-02-12 DIAGNOSIS — R609 Edema, unspecified: Secondary | ICD-10-CM | POA: Diagnosis not present

## 2020-02-12 DIAGNOSIS — K7290 Hepatic failure, unspecified without coma: Secondary | ICD-10-CM | POA: Diagnosis not present

## 2020-02-12 DIAGNOSIS — E871 Hypo-osmolality and hyponatremia: Secondary | ICD-10-CM | POA: Diagnosis not present

## 2020-02-12 DIAGNOSIS — I129 Hypertensive chronic kidney disease with stage 1 through stage 4 chronic kidney disease, or unspecified chronic kidney disease: Secondary | ICD-10-CM | POA: Diagnosis not present

## 2020-02-13 DIAGNOSIS — F0390 Unspecified dementia without behavioral disturbance: Secondary | ICD-10-CM | POA: Diagnosis not present

## 2020-02-13 DIAGNOSIS — B179 Acute viral hepatitis, unspecified: Secondary | ICD-10-CM | POA: Diagnosis not present

## 2020-02-13 DIAGNOSIS — J449 Chronic obstructive pulmonary disease, unspecified: Secondary | ICD-10-CM | POA: Diagnosis not present

## 2020-02-13 DIAGNOSIS — U071 COVID-19: Secondary | ICD-10-CM | POA: Diagnosis not present

## 2020-02-13 DIAGNOSIS — N179 Acute kidney failure, unspecified: Secondary | ICD-10-CM | POA: Diagnosis not present

## 2020-02-13 DIAGNOSIS — K7460 Unspecified cirrhosis of liver: Secondary | ICD-10-CM | POA: Diagnosis not present

## 2020-02-13 DIAGNOSIS — K7290 Hepatic failure, unspecified without coma: Secondary | ICD-10-CM | POA: Diagnosis not present

## 2020-02-13 DIAGNOSIS — R945 Abnormal results of liver function studies: Secondary | ICD-10-CM | POA: Diagnosis not present

## 2020-02-13 DIAGNOSIS — N183 Chronic kidney disease, stage 3 unspecified: Secondary | ICD-10-CM | POA: Diagnosis not present

## 2020-02-13 DIAGNOSIS — B182 Chronic viral hepatitis C: Secondary | ICD-10-CM | POA: Diagnosis not present

## 2020-02-13 DIAGNOSIS — D61818 Other pancytopenia: Secondary | ICD-10-CM | POA: Diagnosis not present

## 2020-02-13 DIAGNOSIS — D696 Thrombocytopenia, unspecified: Secondary | ICD-10-CM | POA: Diagnosis not present

## 2020-02-16 DIAGNOSIS — F419 Anxiety disorder, unspecified: Secondary | ICD-10-CM | POA: Diagnosis not present

## 2020-02-16 DIAGNOSIS — K7290 Hepatic failure, unspecified without coma: Secondary | ICD-10-CM | POA: Diagnosis not present

## 2020-02-16 DIAGNOSIS — U071 COVID-19: Secondary | ICD-10-CM | POA: Diagnosis not present

## 2020-02-16 DIAGNOSIS — E559 Vitamin D deficiency, unspecified: Secondary | ICD-10-CM | POA: Diagnosis not present

## 2020-02-16 DIAGNOSIS — N183 Chronic kidney disease, stage 3 unspecified: Secondary | ICD-10-CM | POA: Diagnosis not present

## 2020-02-16 DIAGNOSIS — K7030 Alcoholic cirrhosis of liver without ascites: Secondary | ICD-10-CM | POA: Diagnosis not present

## 2020-02-16 DIAGNOSIS — B182 Chronic viral hepatitis C: Secondary | ICD-10-CM | POA: Diagnosis not present

## 2020-02-16 DIAGNOSIS — J449 Chronic obstructive pulmonary disease, unspecified: Secondary | ICD-10-CM | POA: Diagnosis not present

## 2020-02-16 DIAGNOSIS — R7989 Other specified abnormal findings of blood chemistry: Secondary | ICD-10-CM | POA: Diagnosis not present

## 2020-02-16 DIAGNOSIS — F0390 Unspecified dementia without behavioral disturbance: Secondary | ICD-10-CM | POA: Diagnosis not present

## 2020-02-19 DIAGNOSIS — Z20828 Contact with and (suspected) exposure to other viral communicable diseases: Secondary | ICD-10-CM | POA: Diagnosis not present

## 2020-02-20 DIAGNOSIS — E559 Vitamin D deficiency, unspecified: Secondary | ICD-10-CM | POA: Diagnosis not present

## 2020-02-20 DIAGNOSIS — G9340 Encephalopathy, unspecified: Secondary | ICD-10-CM | POA: Diagnosis not present

## 2020-02-20 DIAGNOSIS — K7469 Other cirrhosis of liver: Secondary | ICD-10-CM | POA: Diagnosis not present

## 2020-02-20 DIAGNOSIS — J449 Chronic obstructive pulmonary disease, unspecified: Secondary | ICD-10-CM | POA: Diagnosis not present

## 2020-02-26 DIAGNOSIS — Z20828 Contact with and (suspected) exposure to other viral communicable diseases: Secondary | ICD-10-CM | POA: Diagnosis not present

## 2020-02-29 DIAGNOSIS — Z20828 Contact with and (suspected) exposure to other viral communicable diseases: Secondary | ICD-10-CM | POA: Diagnosis not present

## 2020-03-04 DIAGNOSIS — F419 Anxiety disorder, unspecified: Secondary | ICD-10-CM | POA: Diagnosis not present

## 2020-03-04 DIAGNOSIS — B182 Chronic viral hepatitis C: Secondary | ICD-10-CM | POA: Diagnosis not present

## 2020-03-04 DIAGNOSIS — Z20828 Contact with and (suspected) exposure to other viral communicable diseases: Secondary | ICD-10-CM | POA: Diagnosis not present

## 2020-03-04 DIAGNOSIS — I10 Essential (primary) hypertension: Secondary | ICD-10-CM | POA: Diagnosis not present

## 2020-03-10 DIAGNOSIS — Z20828 Contact with and (suspected) exposure to other viral communicable diseases: Secondary | ICD-10-CM | POA: Diagnosis not present

## 2020-03-19 DIAGNOSIS — Z20828 Contact with and (suspected) exposure to other viral communicable diseases: Secondary | ICD-10-CM | POA: Diagnosis not present

## 2020-04-02 DIAGNOSIS — G629 Polyneuropathy, unspecified: Secondary | ICD-10-CM | POA: Diagnosis not present

## 2020-04-02 DIAGNOSIS — I10 Essential (primary) hypertension: Secondary | ICD-10-CM | POA: Diagnosis not present

## 2020-04-02 DIAGNOSIS — R1013 Epigastric pain: Secondary | ICD-10-CM | POA: Diagnosis not present

## 2020-04-05 ENCOUNTER — Other Ambulatory Visit (HOSPITAL_COMMUNITY): Payer: Self-pay

## 2020-04-19 DIAGNOSIS — K7031 Alcoholic cirrhosis of liver with ascites: Secondary | ICD-10-CM | POA: Diagnosis not present

## 2020-04-29 DIAGNOSIS — Z20828 Contact with and (suspected) exposure to other viral communicable diseases: Secondary | ICD-10-CM | POA: Diagnosis not present

## 2020-05-03 ENCOUNTER — Other Ambulatory Visit: Payer: Self-pay

## 2020-05-03 ENCOUNTER — Non-Acute Institutional Stay: Payer: Medicaid Other | Admitting: Hospice

## 2020-05-03 DIAGNOSIS — K7031 Alcoholic cirrhosis of liver with ascites: Secondary | ICD-10-CM | POA: Diagnosis not present

## 2020-05-03 DIAGNOSIS — N1832 Chronic kidney disease, stage 3b: Secondary | ICD-10-CM

## 2020-05-03 DIAGNOSIS — L299 Pruritus, unspecified: Secondary | ICD-10-CM | POA: Diagnosis not present

## 2020-05-03 DIAGNOSIS — Z515 Encounter for palliative care: Secondary | ICD-10-CM | POA: Diagnosis not present

## 2020-05-03 NOTE — Progress Notes (Signed)
[facility] Consult Note Telephone: [phone]  Fax: [phone]  PATIENT NAME: Judy Meza DOB: 1960-02-05 MRN: [id number]  PRIMARY CARE PROVIDER: Waylan Rocher, MD, [place] [address] [phone]  Referring Provider: Dr. Caprice Renshaw  RESPONSIBLE PARTY:  [name] is Guardian of person Contact Information    Name Relation Home Work [place] Sister [phone]  [phone]   [place], [place] [phone]  [phone]        Visit is to build trust and highlight Palliative Medicine as specialized medical care for people living with serious illness, aimed at facilitating better quality of life through symptoms relief, assisting with advance care planning and complex medical decision making.   RECOMMENDATIONS/PLAN:   Advance Care Planning/CODE STATUS:CODE STATUS reviewed today. Patient affirmed she is a DO NOT RESUSCITATE.   GOALS OF CARE: Goals of care include to maximize quality of life and symptom management.Patient has a MOST form in facility record; selections include DO NOT RESUSCITATE, comfort measures, no antibiotics, no IV fluids, no feeding tube.  Family is open to Hospice service when patient is eligible. Visit consisted of counseling and education dealing with the complex and emotionally intense issues of symptom management and palliative care in the setting of serious and potentially life-threatening illness. Palliative care team will continue to support patient, patient's family, and medical team.  Symptom management/Plan:  Pruritis likely related to Liver cirrhosis. Hydroxyzine 25mg  every 6 hours prn itching. Apply Ammonium Lactate lotion 12% and Benadryl itch cream 1-0.1% as ordered.  Liver Cirrhosis:  continue on Rifaximin for liver cirrhosis COPD:  No recent COPD exacerbation; patient has Albuterol prn. No fall or ED visit since last visit.   Depression: Continue Paroxetine as ordered. Psych consult as needed.Nursing with no concern; encouraged ongoing care. Follow up: Palliative care will continue to follow for complex medical decision making, advance care planning, and clarification of goals. Return 6 weeks or prn. Encouraged to call provider sooner with any concerns.  CHIEF COMPLAINT: Palliative follow up visit/Pruritis  HISTORY OF PRESENT ILLNESS:  Judy Meza a 60 y.o. female with multiple medical problems including pruritis which is chronic, intermittent, likely related to liver cirrhosis, flaring up this week.  Patient states the itching aggravates her sometimes; medication is helpful.  History of COPD, viral Hep C, Liver Cirrhosis, Depression, CKD 3, HTN,  History of throat cancer.History obtained from review of EMR, discussion with primary team, family and/or patient. Records reviewed and summarized above. All 10 point systems reviewed and are negative except as documented in history of present illness above  Review and summarization of Epic records shows history from other than patient.   Palliative Care was asked to follow this patient by consultation request of Caprice Renshaw, MD to help address complex decision making in the context of advance care planning and goals of care clarification.   CODE STATUS: DNR  PPS: 60%  HOSPICE ELIGIBILITY/DIAGNOSIS: TBD  PAST MEDICAL HISTORY:  Past Medical History:  Diagnosis Date  . Acute kidney failure ([place])   . Chronic hepatitis C ([place])   . Chronic kidney disease, stage 3 ([place])   . Cirrhosis ([place])   . COPD (chronic obstructive pulmonary disease) ([place])   . Coronary artery disease   . COVID-19 virus infection   . 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 Hypertension   . Liver disease   . Normal pressure hydrocephalus ([facility])   . Renal disorder   .  Sleep apnea    uses O2 at night  . Thrombocytopenia ([place])   . Unspecified dementia without behavioral disturbance ([facility])       SOCIAL HX: @SOCX  Patient lives at SNF  for ongoing care   FAMILY HX:  Family History  Problem Relation Age of Onset  . Healthy Mother   . Hypertension Other     Review lab tests/diagnostics Results for SANTINA, TRILLO (MRN [id number]) as of 05/03/2020 09:30  Ref. Range 11/12/2019 04:55  Sodium Latest Ref Range: 135 - 145 mmol/L 141  Potassium Latest Ref Range: 3.5 - 5.1 mmol/L 4.8  Chloride Latest Ref Range: 98 - 111 mmol/L 111  CO2 Latest Ref Range: 22 - 32 mmol/L 20 (L)  Glucose Latest Ref Range: 70 - 99 mg/dL 110 (H)  BUN Latest Ref Range: 6 - 20 mg/dL 39 (H)  Creatinine Latest Ref Range: 0.44 - 1.00 mg/dL 2.02 (H)  Calcium Latest Ref Range: 8.9 - 10.3 mg/dL 9.0  Anion gap Latest Ref Range: 5 - 15  10  Alkaline Phosphatase Latest Ref Range: 38 - 126 U/L 59  Albumin Latest Ref Range: 3.5 - 5.0 g/dL 2.9 (L)  AST Latest Ref Range: 15 - 41 U/L 44 (H)  ALT Latest Ref Range: 0 - 44 U/L 22  Total Protein Latest Ref Range: 6.5 - 8.1 g/dL 7.2  Total Bilirubin Latest Ref Range: 0.3 - 1.2 mg/dL 1.3 (H)  GFR, Estimated Latest Ref Range: >60 mL/min 28 (L)    ALLERGIES: No Known Allergies    PERTINENT MEDICATIONS:  Outpatient Encounter Medications as of 05/03/2020  Medication Sig  . acetaminophen (TYLENOL) 325 MG tablet Take 650 mg by mouth every 4 (four) hours as needed.  [place] Kitchen albuterol (PROVENTIL) (2.5 MG/3ML) 0.083% nebulizer solution Take 3 mLs (2.5 mg total) by nebulization every 6 (six) hours as needed for wheezing or shortness of breath.  . Albuterol Sulfate (PROAIR RESPICLICK) 532 (90 Base) MCG/ACT AEPB Inhale 2 puffs into the lungs every 6 (six) hours as needed.  . diphenhydrAMINE-zinc acetate (BENADRYL ITCH STOPPING) cream Apply 1 application topically every 4 (four) hours as needed for itching.  . ergocalciferol (VITAMIN D2) 1.25 MG (50000 UT) capsule Take 50,000 Units by mouth once a week. Fridays  . famotidine (PEPCID) 20 MG tablet Take 20 mg by mouth daily.  . feeding  supplement, ENSURE ENLIVE, (ENSURE ENLIVE) LIQD Take 237 mLs by mouth 2 (two) times daily between meals.  . folic acid (FOLVITE) 1 MG tablet Take 1 tablet (1 mg total) by mouth daily.  . furosemide (LASIX) 20 MG tablet Take 2 tablets (40 mg total) by mouth every Monday, Wednesday, and Friday.  . gabapentin (NEURONTIN) 100 MG capsule Take 200 mg by mouth 3 (three) times daily.  . heparin 5000 UNIT/ML injection 5,000 Units daily.  . hydrOXYzine (ATARAX/VISTARIL) 25 MG tablet Take 25 mg by mouth every 6 (six) hours as needed for anxiety or itching.  . lactulose (CHRONULAC) 10 GM/15ML solution Take 30 mLs (20 g total) by mouth 3 (three) times daily. (Patient taking differently: Take 40 g by mouth QID.)  . loratadine (CLARITIN) 10 MG tablet Take 1 tablet (10 mg total) by mouth daily.  . nadolol (CORGARD) 40 MG tablet Take 1 tablet (40 mg total) by mouth daily.  . nadolol (CORGARD) 40 MG tablet Take 1 tablet (40 mg total) by mouth daily.  . Olopatadine HCl 0.2 % SOLN Place 1 drop into both eyes daily as needed (allergic conjunctivitis).  [place] Kitchen  PARoxetine (PAXIL) 40 MG tablet Take 1 tablet (40 mg total) by mouth every morning.  . rifaximin (XIFAXAN) 550 MG TABS tablet Take 1 tablet (550 mg total) by mouth 2 (two) times daily.  . Skin Protectants, Misc. (EUCERIN) cream Apply 1 application topically daily as needed for dry skin.   . sodium bicarbonate 650 MG tablet Take 1 tablet (650 mg total) by mouth 2 (two) times daily.  [place] Kitchen spironolactone (ALDACTONE) 100 MG tablet Take 1 tablet (100 mg total) by mouth every Monday, Wednesday, and Friday.  . thiamine 100 MG tablet Take 1 tablet (100 mg total) by mouth daily.   No facility-administered encounter medications on file as of 05/03/2020.      ROS  General: NAD, appropriately dressed Constitution: Denies fever/chills Cardiovascular: denies chest pain Pulmonary: denies  cough, denies dyspnea  Abdomen: endorses fair appetite, denies constipation or  diarrhea GU: denies dysuria MSK:  endorses ROM limitations, no falls reported Skin: Endorses itching especially in the extremities Neurological: endorses weakness, denies pain, denies insomnia Psych: Endorses positive mood Heme/lymph/immuno: denies bruises, no abnormal bleeding   PHYSICAL EXAM  Height/Weigh: 5 feet 1 inch/145.2 Ibs General: In no acute distress, appropriately dressed Cardiovascular: regular rate and rhythm; no edema in BLE Pulmonary: no cough, no increased work of breathing, normal respiratory effort Abdomen: soft, non tender, no guarding, positive bowel sounds in all quadrants, ascites GU:  no suprapubic tenderness Eyes: Normal lids, no discharge, sclera anicteric ENMT: Moist mucous membranes Musculoskeletal:  weakness, sarcopenia Skin: no rash to visible skin, warm without cyanosis,  Psych: non-anxious affect Neurological: Weakness but otherwise non focal Heme/lymph/immuno: no bruises, no bleeding  I spent 55 minutes providing this consultation; this includes time spent with patient/family, chart review and documentation. More than 50% of the time in this consultation was spent on counseling and coordinating communication   Thank you for the opportunity to participate in the care of SHAIDA ROUTE Please call our office at [phone] if we can be of additional assistance.  Note: Portions of this note were generated with Lobbyist. Dictation errors may occur despite best attempts at proofreading.  Teodoro Spray, NP

## 2020-05-14 ENCOUNTER — Other Ambulatory Visit: Payer: Medicaid Other

## 2020-05-28 ENCOUNTER — Ambulatory Visit
Admission: RE | Admit: 2020-05-28 | Discharge: 2020-05-28 | Disposition: A | Discharge status: Home / Self Care | Payer: Medicaid Other | Source: Ambulatory Visit | Attending: Internal Medicine | Admitting: Internal Medicine

## 2020-05-28 DIAGNOSIS — K7030 Alcoholic cirrhosis of liver without ascites: Secondary | ICD-10-CM

## 2020-05-28 DIAGNOSIS — K8080 Other cholelithiasis without obstruction: Secondary | ICD-10-CM | POA: Diagnosis not present

## 2020-05-28 DIAGNOSIS — K7460 Unspecified cirrhosis of liver: Secondary | ICD-10-CM | POA: Diagnosis not present

## 2020-06-25 ENCOUNTER — Non-Acute Institutional Stay: Payer: Medicaid Other | Admitting: Hospice

## 2020-06-25 ENCOUNTER — Other Ambulatory Visit: Payer: Self-pay

## 2020-06-25 DIAGNOSIS — Z515 Encounter for palliative care: Secondary | ICD-10-CM | POA: Diagnosis not present

## 2020-06-25 DIAGNOSIS — K7031 Alcoholic cirrhosis of liver with ascites: Secondary | ICD-10-CM | POA: Diagnosis not present

## 2020-06-25 DIAGNOSIS — R21 Rash and other nonspecific skin eruption: Secondary | ICD-10-CM | POA: Diagnosis not present

## 2020-06-25 DIAGNOSIS — L299 Pruritus, unspecified: Secondary | ICD-10-CM | POA: Diagnosis not present

## 2020-06-25 NOTE — Progress Notes (Signed)
[facility] Consult Note Telephone: [phone]  Fax: [phone]  PATIENT NAME: Judy Meza DOB: 02-16-1960 MRN: [id number]  PRIMARY CARE PROVIDER:   Caprice Renshaw, MD Caprice Renshaw, [place],  [place] [address]  REFERRING PROVIDER: Caprice Renshaw, MD Caprice Renshaw, [place],  [place] [address]  RESPONSIBLE PARTY: [name] is guardian of person Contact Information     Name Relation Home Work [place] Sister [phone]  [phone]   [place], [place] [phone]  [phone]       Visit is to build trust and highlight Palliative Medicine as specialized medical care for people living with serious illness, aimed at facilitating better quality of life through symptoms relief, assisting with advance care planning and complex medical decision making.   RECOMMENDATIONS/PLAN:   Advance Care Planning/Code Status: Patient is a DO NOT RESUSCITATE  Goals of Care: Goals of care include to maximize quality of life and symptom management.  MOST selections include comfort measures, no antibiotics, no IV fluids, no feeding tube.  Guardian is open to hospice service in the future when patient is eligible.  Visit consisted of counseling and education dealing with the complex and emotionally intense issues of symptom management and palliative care in the setting of serious and potentially life-threatening illness. Palliative care team will continue to support patient, patient's family, and medical team.  Symptom management/Plan:  Pruritus: Managed with ammonium lactate lotion, Benadryl itch cream and hydroxyzine.  Liver cirrhosis: Continue rifaximin mean as ordered.  Depression: Patient denies depressive mood.  Continue paroxetine as ordered.  Psych consult as scheduled/needed.  Follow up: Palliative care will continue to follow for complex medical decision making, advance care planning, and  clarification of goals. Return 6 weeks or prn. Encouraged to call provider sooner with any concerns.  CHIEF COMPLAINT: Palliative follow up visit/pruritus  HISTORY OF PRESENT ILLNESS:  Judy Meza a 60 y.o. female with multiple medical problems including pruritis which patient stated has improved.  She endorsed occasional itching but this is currently well managed with her anti itching creams.  History of alcoholic liver cirrhosis, COPD, viral hep C, CKD 3, hypertension, throat cancer.  History obtained from review of EMR, discussion with primary team, family and/or patient. Records reviewed and summarized above.  Patient denies pain/discomfort, no concerns at this time  from nursing staff.  All 10 point systems reviewed and are negative except as documented in history of present illness above  Review and summarization of Epic records shows history from other than patient.   Palliative Care was asked to follow this patient o help address complex decision making in the context of advance care planning and goals of care clarification.   PPS: 60%  ROS General: NAD, appropriately dressed Constitution: Denies fever/chills Cardiovascular: denies chest pain Pulmonary: denies  cough, denies dyspnea Abdomen: endorses fair appetite, denies constipation or diarrhea GU: denies dysuria MSK:  endorses ROM limitations, no falls reported Skin: Endorses occasional itching especially in the extremities Neurological: endorses weakness, denies pain, denies insomnia Psych: Endorses positive mood Heme/lymph/immuno: denies bruises, no abnormal bleeding     PHYSICAL EXAM  Height/Weigh: 5 feet 1 inch/152.3 Ibs General: In no acute distress, appropriately dressed Cardiovascular: regular rate and rhythm; no edema in BLE Pulmonary: no cough, no increased work of breathing, normal respiratory effort.  Clean dry intact dressing to tracheostomy site Abdomen: soft, non tender, no guarding, positive bowel sounds  in all quadrants, ascites GU:  no  suprapubic tenderness Eyes: Normal lids, no discharge, sclera anicteric ENMT: Moist mucous membranes Musculoskeletal:  weakness, sarcopenia Skin: no rash to visible skin, warm without cyanosis, Psych: non-anxious affect Neurological: Weakness but otherwise non focal Heme/lymph/immuno: no bruises, no bleeding  PERTINENT MEDICATIONS:  Outpatient Encounter Medications as of 06/25/2020  Medication Sig   acetaminophen (TYLENOL) 325 MG tablet Take 650 mg by mouth every 4 (four) hours as needed.   albuterol (PROVENTIL) (2.5 MG/3ML) 0.083% nebulizer solution Take 3 mLs (2.5 mg total) by nebulization every 6 (six) hours as needed for wheezing or shortness of breath.   Albuterol Sulfate (PROAIR RESPICLICK) 660 (90 Base) MCG/ACT AEPB Inhale 2 puffs into the lungs every 6 (six) hours as needed.   diphenhydrAMINE-zinc acetate (BENADRYL ITCH STOPPING) cream Apply 1 application topically every 4 (four) hours as needed for itching.   ergocalciferol (VITAMIN D2) 1.25 MG (50000 UT) capsule Take 50,000 Units by mouth once a week. Fridays   famotidine (PEPCID) 20 MG tablet Take 20 mg by mouth daily.   feeding supplement, ENSURE ENLIVE, (ENSURE ENLIVE) LIQD Take 237 mLs by mouth 2 (two) times daily between meals.   folic acid (FOLVITE) 1 MG tablet Take 1 tablet (1 mg total) by mouth daily.   furosemide (LASIX) 20 MG tablet Take 2 tablets (40 mg total) by mouth every Monday, Wednesday, and Friday.   gabapentin (NEURONTIN) 100 MG capsule Take 200 mg by mouth 3 (three) times daily.   heparin 5000 UNIT/ML injection 5,000 Units daily.   hydrOXYzine (ATARAX/VISTARIL) 25 MG tablet Take 25 mg by mouth every 6 (six) hours as needed for anxiety or itching.   lactulose (CHRONULAC) 10 GM/15ML solution Take 30 mLs (20 g total) by mouth 3 (three) times daily. (Patient taking differently: Take 40 g by mouth QID.)   loratadine (CLARITIN) 10 MG tablet Take 1 tablet (10 mg total) by mouth daily.    nadolol (CORGARD) 40 MG tablet Take 1 tablet (40 mg total) by mouth daily.   nadolol (CORGARD) 40 MG tablet Take 1 tablet (40 mg total) by mouth daily.   Olopatadine HCl 0.2 % SOLN Place 1 drop into both eyes daily as needed (allergic conjunctivitis).   PARoxetine (PAXIL) 40 MG tablet Take 1 tablet (40 mg total) by mouth every morning.   rifaximin (XIFAXAN) 550 MG TABS tablet Take 1 tablet (550 mg total) by mouth 2 (two) times daily.   Skin Protectants, Misc. (EUCERIN) cream Apply 1 application topically daily as needed for dry skin.    sodium bicarbonate 650 MG tablet Take 1 tablet (650 mg total) by mouth 2 (two) times daily.   spironolactone (ALDACTONE) 100 MG tablet Take 1 tablet (100 mg total) by mouth every Monday, Wednesday, and Friday.   thiamine 100 MG tablet Take 1 tablet (100 mg total) by mouth daily.   No facility-administered encounter medications on file as of 06/25/2020.    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 More than 50% of the time in this consultation was spent on counseling and coordinating communication   Thank you for the opportunity to participate in the care of DEYONNA FITZSIMMONS Please call our office at [phone] if we can be of additional  assistance.  Note: Portions of this note were generated with Lobbyist. Dictation errors may occur despite best attempts at proofreading.  Teodoro Spray, NP

## 2020-06-26 DIAGNOSIS — E559 Vitamin D deficiency, unspecified: Secondary | ICD-10-CM | POA: Diagnosis not present

## 2020-06-26 DIAGNOSIS — R238 Other skin changes: Secondary | ICD-10-CM | POA: Diagnosis not present

## 2020-06-26 DIAGNOSIS — J449 Chronic obstructive pulmonary disease, unspecified: Secondary | ICD-10-CM | POA: Diagnosis not present

## 2020-06-26 DIAGNOSIS — Z930 Tracheostomy status: Secondary | ICD-10-CM | POA: Diagnosis not present

## 2020-06-26 DIAGNOSIS — N183 Chronic kidney disease, stage 3 unspecified: Secondary | ICD-10-CM | POA: Diagnosis not present

## 2020-06-26 DIAGNOSIS — I10 Essential (primary) hypertension: Secondary | ICD-10-CM | POA: Diagnosis not present

## 2020-06-26 DIAGNOSIS — F0390 Unspecified dementia without behavioral disturbance: Secondary | ICD-10-CM | POA: Diagnosis not present

## 2020-06-26 DIAGNOSIS — F419 Anxiety disorder, unspecified: Secondary | ICD-10-CM | POA: Diagnosis not present

## 2020-07-23 DIAGNOSIS — C329 Malignant neoplasm of larynx, unspecified: Secondary | ICD-10-CM | POA: Diagnosis not present

## 2020-07-23 DIAGNOSIS — J9504 Tracheo-esophageal fistula following tracheostomy: Secondary | ICD-10-CM | POA: Diagnosis not present

## 2020-08-07 DIAGNOSIS — N1832 Chronic kidney disease, stage 3b: Secondary | ICD-10-CM | POA: Diagnosis not present

## 2020-08-07 DIAGNOSIS — I129 Hypertensive chronic kidney disease with stage 1 through stage 4 chronic kidney disease, or unspecified chronic kidney disease: Secondary | ICD-10-CM | POA: Diagnosis not present

## 2020-08-07 DIAGNOSIS — E872 Acidosis: Secondary | ICD-10-CM | POA: Diagnosis not present

## 2020-08-07 DIAGNOSIS — E871 Hypo-osmolality and hyponatremia: Secondary | ICD-10-CM | POA: Diagnosis not present

## 2020-08-07 DIAGNOSIS — D631 Anemia in chronic kidney disease: Secondary | ICD-10-CM | POA: Diagnosis not present

## 2020-08-07 DIAGNOSIS — R609 Edema, unspecified: Secondary | ICD-10-CM | POA: Diagnosis not present

## 2020-08-07 DIAGNOSIS — N2581 Secondary hyperparathyroidism of renal origin: Secondary | ICD-10-CM | POA: Diagnosis not present
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
## 2020-08-20 ENCOUNTER — Non-Acute Institutional Stay: Payer: Medicaid Other | Admitting: Hospice

## 2020-08-20 ENCOUNTER — Other Ambulatory Visit: Payer: Self-pay

## 2020-08-20 DIAGNOSIS — K7031 Alcoholic cirrhosis of liver with ascites: Secondary | ICD-10-CM

## 2020-08-20 DIAGNOSIS — Z515 Encounter for palliative care: Secondary | ICD-10-CM

## 2020-08-20 DIAGNOSIS — I10 Essential (primary) hypertension: Secondary | ICD-10-CM

## 2020-08-20 DIAGNOSIS — L299 Pruritus, unspecified: Secondary | ICD-10-CM

## 2020-08-20 NOTE — Progress Notes (Signed)
[facility] Consult Note Telephone: [phone]  Fax: [phone]  PATIENT NAME: Judy Meza DOB: November 17, 1960 MRN: [id number]  PRIMARY CARE PROVIDER:   Caprice Renshaw, MD Caprice Renshaw, [place],  [place] [address]  REFERRING PROVIDER: Caprice Renshaw, MD Caprice Renshaw, [place] [address] [place],  [place] [address]   RESPONSIBLE PARTY: Judy Meza is guardian of person Contact Information     Name Relation Home Work [place] Sister [phone]  [phone]   [place], [place] [phone]  [phone]       Visit is to build trust and highlight Palliative Medicine as specialized medical care for people living with serious illness, aimed at facilitating better quality of life through symptoms relief, assisting with advance care planning and complex medical decision making. This is a follow up visit.  RECOMMENDATIONS/PLAN:    Advance Care Planning/Code Status: Patient is a DO NOT RESUSCITATE   Goals of Care: Goals of care include to maximize quality of life and symptom management.  MOST selections include comfort measures, no antibiotics, no IV fluids, no feeding tube.  Guardian is open to hospice service in the future when patient is eligible.  Visit consisted of counseling and education dealing with the complex and emotionally intense issues of symptom management and palliative care in the setting of serious and potentially life-threatening illness. Palliative care team will continue to support patient, patient's family, and medical team.  Symptom management/Plan:  Liver cirrhosis: Continue rifaximin and Lactulose as ordered. No confusion or change in mental status. Routine CBC CMP. Pruritus: Likely related to liver cirrhosis. Managed with Benadryl itch cream and hydroxyzine HTN: Managed with Nadolol Depression: Patient interacts with staff and residents; goes for activities. Patient denies depressive mood.   Continue paroxetine as ordered.  Psych consult as scheduled/needed. Follow up: Palliative care will continue to follow for complex medical decision making, advance care planning, and clarification of goals. Return 6 weeks or prn. Encouraged to call provider sooner with any concerns.  CHIEF COMPLAINT: Palliative follow up  HISTORY OF PRESENT ILLNESS:  Judy Meza a 60 y.o. female with multiple medical problems including  alcoholic liver cirrhosis, pruritis, COPD, viral hep C, CKD 3, hypertension, throat cancer.  She denies respiratory distress, no recent COPD exacerbation. She denies pain/discomfort.  Nursing with no concerns at this time.  History obtained from review of EMR, discussion with primary team, family and/or patient. Records reviewed and summarized above. All 10 point systems reviewed and are negative except as documented in history of present illness above  Review and summarization of Epic records shows history from other than patient.   Palliative Care was asked to follow this patient o help address complex decision making in the context of advance care planning and goals of care clarification.   PHYSICAL EXAM  General: In no acute distress, appropriately dressed Cardiovascular: regular rate and rhythm; no edema in BLE Pulmonary: no cough, no increased work of breathing, normal respiratory effort Abdomen: soft, non tender, no guarding, positive bowel sounds in all quadrants GU:  no suprapubic tenderness Eyes: Normal lids, no discharge ENMT: Moist mucous membranes Musculoskeletal:  weakness, ambulatory.  Skin: no rash to visible skin, warm without cyanosis,  Psych: non-anxious affect Neurological: Weakness but otherwise non focal Heme/lymph/immuno: no bruises, no bleeding  PERTINENT MEDICATIONS:  Outpatient Encounter Medications as of 08/20/2020  Medication Sig   acetaminophen (TYLENOL) 325 MG tablet Take 650 mg by mouth every 4 (four) hours as needed.  albuterol  (PROVENTIL) (2.5 MG/3ML) 0.083% nebulizer solution Take 3 mLs (2.5 mg total) by nebulization every 6 (six) hours as needed for wheezing or shortness of breath.   Albuterol Sulfate (PROAIR RESPICLICK) 096 (90 Base) MCG/ACT AEPB Inhale 2 puffs into the lungs every 6 (six) hours as needed.   diphenhydrAMINE-zinc acetate (BENADRYL ITCH STOPPING) cream Apply 1 application topically every 4 (four) hours as needed for itching.   ergocalciferol (VITAMIN D2) 1.25 MG (50000 UT) capsule Take 50,000 Units by mouth once a week. Fridays   famotidine (PEPCID) 20 MG tablet Take 20 mg by mouth daily.   feeding supplement, ENSURE ENLIVE, (ENSURE ENLIVE) LIQD Take 237 mLs by mouth 2 (two) times daily between meals.   folic acid (FOLVITE) 1 MG tablet Take 1 tablet (1 mg total) by mouth daily.   furosemide (LASIX) 20 MG tablet Take 2 tablets (40 mg total) by mouth every Monday, Wednesday, and Friday.   gabapentin (NEURONTIN) 100 MG capsule Take 200 mg by mouth 3 (three) times daily.   heparin 5000 UNIT/ML injection 5,000 Units daily.   hydrOXYzine (ATARAX/VISTARIL) 25 MG tablet Take 25 mg by mouth every 6 (six) hours as needed for anxiety or itching.   lactulose (CHRONULAC) 10 GM/15ML solution Take 30 mLs (20 g total) by mouth 3 (three) times daily. (Patient taking differently: Take 40 g by mouth QID.)   loratadine (CLARITIN) 10 MG tablet Take 1 tablet (10 mg total) by mouth daily.   nadolol (CORGARD) 40 MG tablet Take 1 tablet (40 mg total) by mouth daily.   nadolol (CORGARD) 40 MG tablet Take 1 tablet (40 mg total) by mouth daily.   Olopatadine HCl 0.2 % SOLN Place 1 drop into both eyes daily as needed (allergic conjunctivitis).   PARoxetine (PAXIL) 40 MG tablet Take 1 tablet (40 mg total) by mouth every morning.   rifaximin (XIFAXAN) 550 MG TABS tablet Take 1 tablet (550 mg total) by mouth 2 (two) times daily.   Skin Protectants, Misc. (EUCERIN) cream Apply 1 application topically daily as needed for dry skin.     sodium bicarbonate 650 MG tablet Take 1 tablet (650 mg total) by mouth 2 (two) times daily.   spironolactone (ALDACTONE) 100 MG tablet Take 1 tablet (100 mg total) by mouth every Monday, Wednesday, and Friday.   thiamine 100 MG tablet Take 1 tablet (100 mg total) by mouth daily.   No facility-administered encounter medications on file as of 08/20/2020.    HOSPICE ELIGIBILITY/DIAGNOSIS: TBD  PAST MEDICAL HISTORY:  Past Medical History:  Diagnosis Date   Acute kidney failure (HCC)    Chronic hepatitis C ([place])    Chronic kidney disease, stage 3 (HCC)    Cirrhosis (HCC)    COPD (chronic obstructive pulmonary disease) ([place])    Coronary artery disease    COVID-19 virus infection    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   I   Hypertension    Liver disease    Normal pressure hydrocephalus (HCC)    Renal disorder    Sleep apnea    uses O2 at night   Thrombocytopenia (HCC)    Unspecified dementia without behavioral disturbance (HCC)     ALLERGIES: No Known Allergies    I spent  50 minutes providing this consultation; this includes time spent with patient/family, chart review and documentation. More than 50% of the time in this consultation was spent on counseling and coordinating communication   Thank you for the opportunity to participate in the care  of LEEZA HEINER Please call our office at [phone] if we can be of additional assistance.  Note: Portions of this note were generated with Lobbyist. Dictation errors may occur despite best attempts at proofreading.  Teodoro Spray, NP

## 2020-09-10 NOTE — H&P (Signed)
Judy Meza is a 60 y.o. female who presents as a new Patient.   Referring Provider: System, Prov Not In  Chief complaint: Throat cancer.  HPI: Moved down here with her family a few weeks ago from [place]. 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 She underwent radiation therapy. They attempted chemotherapy but she did not tolerate it. She had a tracheostomy and had it removed in April but it never healed up. She continues to smoke. She is able to eat and drink without much difficulty. I do not have any official records.  PMH/Meds/All/SocHx/FamHx/ROS:   History reviewed. No pertinent past medical history.  History reviewed. No pertinent surgical history.  No family history of bleeding disorders, wound healing problems or difficulty with anesthesia.   Social History   Socioeconomic History   Marital status: Unknown  Spouse name: Not on file   Number of children: Not on file   Years of education: Not on file   Highest education level: Not on file  Occupational History   Not on file  Tobacco Use   Smoking status: Current Every Day Smoker  Packs/day: 0.25   Smokeless tobacco: Never Used  Vaping Use   Vaping Use: Never used  Substance and Sexual Activity   Alcohol use: Not on file   Drug use: Not on file   Sexual activity: Not on file  Other Topics Concern   Not on file  Social History Narrative   Not on file   Social Determinants of Health   Financial Resource Strain:   Difficulty of Paying Living Expenses:  Food Insecurity:   Worried About Charity fundraiser in the Last Year:   Arboriculturist in the Last Year:  Transportation Needs:   Film/video editor (Medical):   Lack of Transportation (Non-Medical):  Physical Activity:   Days of Exercise per Week:   Minutes of Exercise per Session:  Stress:   Feeling of Stress :  Social Connections:   Frequency of Communication with Friends and Family:   Frequency of Social  Gatherings with Friends and Family:   Attends Religious Services:   Active Member of Clubs or Organizations:   Attends Music therapist:   Marital Status:   Current Outpatient Medications:   bisacodyL (FLEET) 10 mg/30 mL [place], Place 10 mg rectally once., Disp: , Rfl:   cholestyramine (QUESTRAN) 4 gram powder, Take 4 g by mouth 2 times daily., Disp: , Rfl:   gabapentin (NEURONTIN) 250 mg/5 mL solution, TAKE 6 MLS (300 MG TOTAL) BY MOUTH AT BEDTIME., Disp: , Rfl:   hydrOXYzine HCL (ATARAX) 10 MG tablet, TAKE 1 TABLET BY MOUTH 3 TIMES DAILY AS NEEDED FOR ITCHING OR ANXIETY., Disp: , Rfl:   lactulose (ENULOSE) 10 gram/15 mL solution, Take 20 g by mouth 3 times daily., Disp: , Rfl:   loratadine (CLARITIN) 10 mg tablet, TAKE 1 TABLET BY MOUTH EVERY DAY, Disp: , Rfl:   LORazepam (ATIVAN) 0.5 MG tablet, Take 0.5 mg by mouth every 6 (six) hours as needed for Anxiety., Disp: , Rfl:   memantine (NAMENDA) 5 MG tablet, TAKE 1 TABLET BY MOUTH TWICE A DAY, Disp: , Rfl:   multivitamin capsule, Take 1 capsule by mouth daily., Disp: , Rfl:   pantoprazole (PROTONIX) 40 MG tablet, TAKE 1 TABLET BY MOUTH EVERY DAY, Disp: , Rfl:   PARoxetine (PAXIL) 40 MG tablet, TAKE 1 TABLET BY MOUTH EVERY DAY IN THE MORNING, Disp: , Rfl:  PROAIR HFA 90 mcg/actuation inhaler, TAKE 2 PUFFS BY MOUTH EVERY 6 HOURS AS NEEDED FOR WHEEZE OR SHORTNESS OF BREATH, Disp: , Rfl:   sofosbuvir-velpatasvir (EPCLUSA) 400-100 mg tablet, Take 1 tablet by mouth daily., Disp: , Rfl:   spironolactone (ALDACTONE) 100 MG tablet, Take 100 mg by mouth daily., Disp: , Rfl:   thiamine (VITAMIN B-1) 100 MG tablet, Take 100 mg by mouth daily., Disp: , Rfl:   XIFAXAN 550 mg Tab tablet, TAKE 1 TABLET BY MOUTH TWICE A DAY, Disp: , Rfl:   A complete ROS was performed with pertinent positives/negatives noted in the HPI. The remainder of the ROS are negative.   Physical Exam:   BP 157/97  Pulse 93  Temp (!) 96.6 F (35.9 C)  Ht 1.575 m (5'  2")  Wt 57.2 kg (126 lb)  BMI 23.05 kg/m   General: Thin lady, in no distress, breathing easily. Normal affect. In a pleasant mood. When the tracheostomy site is occluded thoroughly she is able to phonate very well. Head: Normocephalic, atraumatic. No masses, or scars. Eyes: Pupils are equal, and reactive to light. Vision is grossly intact. No spontaneous or gaze nystagmus. Ears: Ear canals are clear. Tympanic membranes are intact, with normal landmarks and the middle ears are clear and healthy. Hearing: Grossly normal. Nose: Nasal cavities are clear with healthy mucosa, no polyps or exudate. Airways are patent. Face: No masses or scars, facial nerve function is symmetric. Oral Cavity: No mucosal abnormalities are noted. Tongue with normal mobility. She is edentulous. Oropharynx: Tonsils are absent. There are no mucosal masses identified. Tongue base appears normal and healthy. Larynx/Hypopharynx: indirect exam reveals healthy, mobile vocal cords, without mucosal lesions in the hypopharynx or larynx. There is diffuse erythema of the supraglottic larynx but no ulcerations or masses identified. Chest: Deferred Neck: No palpable masses, no cervical adenopathy, no thyroid nodules or enlargement. There is diffuse fibrotic change throughout the neck on both sides. Tracheostomy site is widely patent. Neuro: Cranial nerves II-XII with normal function. Balance: Normal gate. Other findings: none.  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 We will try to obtain records. Recommend every 34-month follow-up for the next year or so. Urged her to consider stopping smoking. We can repair the tracheocutaneous fistula surgically but I would prefer to wait until after she has been to full years post radiation and we also discussed the importance of not  smoking which can interfere with the healing process. As I educated her on how to occlude the stoma so that she may talk a lot easier and the family will continue to work on that.

## 2020-09-13 NOTE — Pre-Procedure Instructions (Signed)
Surgical Instructions   Your procedure is scheduled on Friday, September 16th. Report to [facility] Main Entrance "A" at 05:30  A.M., then check in with the Admitting office. Call this number if you have problems the morning of surgery: [phone]   If you have any questions prior to your surgery date call [phone]: Open Monday-Friday 8am-4pm   Remember: Do not eat or drink after midnight the night before your surgery     Take these medicines the morning of surgery with A SIP OF WATER  famotidine (PEPCID) gabapentin (NEURONTIN) loratadine (CLARITIN) nadolol (CORGARD) PARoxetine (PAXIL) rifaximin (XIFAXAN)   If needed: acetaminophen (TYLENOL) albuterol (VENTOLIN HFA) diphenhydrAMINE-zinc acetate (BENADRYL ITCH STOPPING)  hydrOXYzine (ATARAX/VISTARIL)  Olopatadine HCl 0.2 % SOLN  As of today, STOP taking any Aspirin (unless otherwise instructed by your surgeon) Aleve, Naproxen, Ibuprofen, Motrin, Advil, Goody's, BC's, all herbal medications, fish oil, and all vitamins.          Do not wear jewelry or makeup Do not wear lotions, powders, perfumes, or deodorant. Do not shave 48 hours prior to surgery.  Do not bring valuables to the hospital. [facility] is not responsible for any belongings or valuables. Do not wear nail polish, gel polish, artificial nails, or any other type of covering on natural nails including finger and toenails. If patients have artificial nails, gel coating, etc. that need to be removed by a nail salon please have this removed prior to surgery or surgery may need to be canceled/delayed if the surgeon/ anesthesia feels like the patient is unable to be adequately monitored.               Do NOT Smoke (Tobacco/Vaping)  24 hours prior to your procedure If you use a CPAP at night, you may bring all equipment for your overnight stay.   Contacts, glasses, dentures or bridgework may not be worn into surgery, please bring cases for these belongings   For  patients admitted to the hospital, discharge time will be determined by your treatment team.   Patients discharged the day of surgery will not be allowed to drive home, and someone needs to stay with them for 24 hours.  ONLY 1 SUPPORT PERSON MAY BE PRESENT WHILE YOU ARE IN SURGERY. IF YOU ARE TO BE ADMITTED ONCE YOU ARE IN YOUR ROOM YOU WILL BE ALLOWED TWO (2) VISITORS.  Minor children may have two parents present. Special consideration for safety and communication needs will be reviewed on a case by case basis.  Special instructions:    Oral Hygiene is also important to reduce your risk of infection.  Remember - BRUSH YOUR TEETH THE MORNING OF SURGERY WITH YOUR REGULAR TOOTHPASTE   [place]- Preparing For Surgery  Before surgery, you can play an important role. Because skin is not sterile, your skin needs to be as free of germs as possible. You can reduce the number of germs on your skin by washing with CHG (chlorahexidine gluconate) Soap before surgery.  CHG is an antiseptic cleaner which kills germs and bonds with the skin to continue killing germs even after washing.     Please do not use if you have an allergy to CHG or antibacterial soaps. If your skin becomes reddened/irritated stop using the CHG.  Do not shave (including legs and underarms) for at least 48 hours prior to first CHG shower. It is OK to shave your face.  Please follow these instructions carefully.     Shower the [employer] BEFORE SURGERY  and the MORNING OF SURGERY with CHG Soap.   If you chose to wash your hair, wash your hair first as usual with your normal shampoo. After you shampoo, rinse your hair and body thoroughly to remove the shampoo.  Then [employer] and genitals (private parts) with your normal soap and rinse thoroughly to remove soap.  After that Use CHG Soap as you would any other liquid soap. You can apply CHG directly to the skin and wash gently with a scrungie or a clean washcloth.   Apply the CHG Soap to  your body ONLY FROM THE NECK DOWN.  Do not use on open wounds or open sores. Avoid contact with your eyes, ears, mouth and genitals (private parts). Wash Face and genitals (private parts)  with your normal soap.   Wash thoroughly, paying special attention to the area where your surgery will be performed.  Thoroughly rinse your body with warm water from the neck down.  DO NOT shower/wash with your normal soap after using and rinsing off the CHG Soap.  Pat yourself dry with a CLEAN TOWEL.  Wear CLEAN PAJAMAS to bed the night before surgery  Place CLEAN SHEETS on your bed the night before your surgery  DO NOT SLEEP WITH PETS.   Day of Surgery:  Take a shower with CHG soap. Wear Clean/Comfortable clothing the morning of surgery Do not apply any deodorants/lotions.   Remember to brush your teeth WITH YOUR REGULAR TOOTHPASTE.   Please read over the following fact sheets that you were given.

## 2020-09-17 ENCOUNTER — Other Ambulatory Visit: Payer: Self-pay

## 2020-09-17 ENCOUNTER — Encounter (HOSPITAL_COMMUNITY): Payer: Self-pay

## 2020-09-17 ENCOUNTER — Encounter (HOSPITAL_COMMUNITY)
Admission: RE | Admit: 2020-09-17 | Discharge: 2020-09-17 | Disposition: A | Discharge status: Home / Self Care | Payer: Medicaid Other | Source: Ambulatory Visit | Attending: Otolaryngology | Admitting: Otolaryngology

## 2020-09-17 DIAGNOSIS — Z01812 Encounter for preprocedural laboratory examination: Secondary | ICD-10-CM | POA: Diagnosis not present

## 2020-09-17 HISTORY — DX: Gastro-esophageal reflux disease without esophagitis: K21.9

## 2020-09-17 HISTORY — DX: Type 2 diabetes mellitus without complications: E11.9

## 2020-09-17 LAB — COMPREHENSIVE METABOLIC PANEL
ALT: 18 U/L (ref 0–44)
AST: 33 U/L (ref 15–41)
Albumin: 3 g/dL — ABNORMAL LOW (ref 3.5–5.0)
Alkaline Phosphatase: 76 U/L (ref 38–126)
Anion gap: 7 (ref 5–15)
BUN: 14 mg/dL (ref 6–20)
CO2: 22 mmol/L (ref 22–32)
Calcium: 9.3 mg/dL (ref 8.9–10.3)
Chloride: 104 mmol/L (ref 98–111)
Creatinine, Ser: 2.23 mg/dL — ABNORMAL HIGH (ref 0.44–1.00)
GFR, Estimated: 25 mL/min — ABNORMAL LOW (ref >60)
Glucose, Bld: 152 mg/dL — ABNORMAL HIGH (ref 70–99)
Potassium: 4.5 mmol/L (ref 3.5–5.1)
Sodium: 133 mmol/L — ABNORMAL LOW (ref 135–145)
Total Bilirubin: 1.2 mg/dL (ref 0.3–1.2)
Total Protein: 7.5 g/dL (ref 6.5–8.1)

## 2020-09-17 LAB — PROTIME-INR
INR: 1.3 — ABNORMAL HIGH (ref 0.8–1.2)
Prothrombin Time: 15.8 seconds — ABNORMAL HIGH (ref 11.4–15.2)

## 2020-09-17 LAB — CBC
HCT: 35.1 % — ABNORMAL LOW (ref 36.0–46.0)
Hemoglobin: 11.5 g/dL — ABNORMAL LOW (ref 12.0–15.0)
MCH: 31.2 pg (ref 26.0–34.0)
MCHC: 32.8 g/dL (ref 30.0–36.0)
MCV: 95.1 fL (ref 80.0–100.0)
Platelets: 123 10*3/uL — ABNORMAL LOW (ref 150–400)
RBC: 3.69 MIL/uL — ABNORMAL LOW (ref 3.87–5.11)
RDW: 18.8 % — ABNORMAL HIGH (ref 11.5–15.5)
WBC: 4.3 10*3/uL (ref 4.0–10.5)
nRBC: 0 % (ref 0.0–0.2)

## 2020-09-17 LAB — APTT: aPTT: 40 seconds — ABNORMAL HIGH (ref 24–36)

## 2020-09-17 LAB — HEMOGLOBIN A1C
Hgb A1c MFr Bld: 7.2 % — ABNORMAL HIGH (ref 4.8–5.6)
Mean Plasma Glucose: 159.94 mg/dL

## 2020-09-17 NOTE — Pre-Procedure Instructions (Signed)
Surgical Instructions   Your procedure is scheduled on Friday, September 16th. Report to [facility] Main Entrance "A" at 05:30  A.M., then check in with the Admitting office. Call this number if you have problems the morning of surgery: [phone]   If you have any questions prior to your surgery date call [phone]: Open Monday-Friday 8am-4pm   Remember: Do not eat or drink after midnight the night before your surgery     Take these medicines the morning of surgery with A SIP OF WATER  famotidine (PEPCID) gabapentin (NEURONTIN) loratadine (CLARITIN) nadolol (CORGARD) PARoxetine (PAXIL) rifaximin (XIFAXAN)  If needed: acetaminophen (TYLENOL) albuterol (VENTOLIN HFA) inhaler- Please bring all inhalers with you the day of surgery.  hydrOXYzine (ATARAX/VISTARIL)  Olopatadine HCl eye drops  As of today, STOP taking any Aspirin (unless otherwise instructed by your surgeon) Aleve, Naproxen, Ibuprofen, Motrin, Advil, Goody's, BC's, all herbal medications, fish oil, and all vitamins.           Do not wear jewelry or makeup Do not wear lotions, powders, perfumes, or deodorant. Do not shave 48 hours prior to surgery.  Do not bring valuables to the hospital. [facility] is not responsible for any belongings or valuables. Do not wear nail polish, gel polish, artificial nails, or any other type of covering on natural nails including finger and toenails. If patients have artificial nails, gel coating, etc. that need to be removed by a nail salon please have this removed prior to surgery or surgery may need to be canceled/delayed if the surgeon/ anesthesia feels like the patient is unable to be adequately monitored.               HOW TO MANAGE YOUR DIABETES BEFORE AND AFTER SURGERY  Why is it important to control my blood sugar before and after surgery? Improving blood sugar levels before and after surgery helps healing and can limit problems. A way of improving blood sugar control  is eating a healthy diet by:  Eating less sugar and carbohydrates  Increasing activity/exercise  Talking with your doctor about reaching your blood sugar goals High blood sugars (greater than 180 mg/dL) can raise your risk of infections and slow your recovery, so you will need to focus on controlling your diabetes during the weeks before surgery. Make sure that the doctor who takes care of your diabetes knows about your planned surgery including the date and location.  How do I manage my blood sugar before surgery? Check your blood sugar at least 4 times a day, starting 2 days before surgery, to make sure that the level is not too high or low.  Check your blood sugar the morning of your surgery when you wake up and every 2 hours until you get to the Short Stay unit.  If your blood sugar is less than 70 mg/dL, you will need to treat for low blood sugar: Do not take insulin. Treat a low blood sugar (less than 70 mg/dL) with  cup of clear juice (cranberry or apple), 4 glucose tablets, OR glucose gel. Recheck blood sugar in 15 minutes after treatment (to make sure it is greater than 70 mg/dL). If your blood sugar is not greater than 70 mg/dL on recheck, call [phone] for further instructions. Report your blood sugar to the short stay nurse when you get to Short Stay.  If you are admitted to the hospital after surgery: Your blood sugar will be checked by the staff and you will probably be given insulin after  surgery (instead of oral diabetes medicines) to make sure you have good blood sugar levels. The goal for blood sugar control after surgery is 80-180 mg/dL.    Do NOT Smoke (Tobacco/Vaping)  24 hours prior to your procedure. If you use a CPAP at night, you may bring all equipment for your overnight stay.   Contacts, glasses, dentures or bridgework may not be worn into surgery, please bring cases for these belongings   For patients admitted to the hospital, discharge time will be  determined by your treatment team.   Patients discharged the day of surgery will not be allowed to drive home, and someone needs to stay with them for 24 hours.  ONLY 1 SUPPORT PERSON MAY BE PRESENT WHILE YOU ARE IN SURGERY. IF YOU ARE TO BE ADMITTED ONCE YOU ARE IN YOUR ROOM YOU WILL BE ALLOWED TWO (2) VISITORS.  Minor children may have two parents present. Special consideration for safety and communication needs will be reviewed on a case by case basis.  Special instructions:    Oral Hygiene is also important to reduce your risk of infection.  Remember - BRUSH YOUR TEETH THE MORNING OF SURGERY WITH YOUR REGULAR TOOTHPASTE   [place]- Preparing For Surgery  Before surgery, you can play an important role. Because skin is not sterile, your skin needs to be as free of germs as possible. You can reduce the number of germs on your skin by washing with CHG (chlorahexidine gluconate) Soap before surgery.  CHG is an antiseptic cleaner which kills germs and bonds with the skin to continue killing germs even after washing.     Please do not use if you have an allergy to CHG or antibacterial soaps. If your skin becomes reddened/irritated stop using the CHG.  Do not shave (including legs and underarms) for at least 48 hours prior to first CHG shower. It is OK to shave your face.  Please follow these instructions carefully.     Shower the NIGHT BEFORE SURGERY and the MORNING OF SURGERY with CHG Soap.   If you chose to wash your hair, wash your hair first as usual with your normal shampoo. After you shampoo, rinse your hair and body thoroughly to remove the shampoo.  Then [employer] and genitals (private parts) with your normal soap and rinse thoroughly to remove soap.  After that Use CHG Soap as you would any other liquid soap. You can apply CHG directly to the skin and wash gently with a scrungie or a clean washcloth.   Apply the CHG Soap to your body ONLY FROM THE NECK DOWN.  Do not use on open  wounds or open sores. Avoid contact with your eyes, ears, mouth and genitals (private parts). Wash Face and genitals (private parts)  with your normal soap.   Wash thoroughly, paying special attention to the area where your surgery will be performed.  Thoroughly rinse your body with warm water from the neck down.  DO NOT shower/wash with your normal soap after using and rinsing off the CHG Soap.  Pat yourself dry with a CLEAN TOWEL.  Wear CLEAN PAJAMAS to bed the night before surgery  Place CLEAN SHEETS on your bed the night before your surgery  DO NOT SLEEP WITH PETS.   Day of Surgery:  Take a shower with CHG soap. Wear Clean/Comfortable clothing the morning of surgery Do not apply any deodorants/lotions.   Remember to brush your teeth WITH YOUR REGULAR TOOTHPASTE.   Please read over  the following fact sheets that you were given.

## 2020-09-17 NOTE — Progress Notes (Addendum)
PCP - Caprice Renshaw, MD @ [facility]; Authorization form completed in case of requesting info Cardiologist - Denies  PPM/ICD - Denies  Chest x-ray - 11/11/19 EKG - 11/13/19 Stress Test - Denies ECHO - 10/04/19 Cardiac Cath - Denies  Sleep Study - Denies  DM- Denies; A1C obtained per Myra Gianotti, PA-C  Blood Thinner Instructions: N/A Aspirin Instructions: N/A  ERAS Protcol - N/A PRE-SURGERY Ensure or G2- N/A  COVID TEST- Scheduled 09/26/20   Anesthesia review: Yes, Hepatitis C hx; tracheocutaneous fistula; Abnormal Labs  Patient denies shortness of breath, fever, cough and chest pain at PAT appointment   All instructions explained to the patient, with a verbal understanding of the material. Patient agrees to go over the instructions while at home for a better understanding.

## 2020-09-17 NOTE — Progress Notes (Signed)
Called and left VM for Dr. Janeice Robinson assistant as well as [employer] Dr. Constance Holster concerning abnormal labs.
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
## 2020-09-19 NOTE — Anesthesia Preprocedure Evaluation (Addendum)
Anesthesia Evaluation  Patient identified by MRN, date of birth, ID band Patient awake    Reviewed: Allergy & Precautions, NPO status , Patient's Chart, lab work & pertinent test results, reviewed documented beta blocker date and time   Airway Mallampati: II  TM Distance: >3 FB Neck ROM: Full    Dental  (+) Edentulous Upper, Edentulous Lower   Pulmonary sleep apnea and Oxygen sleep apnea , COPD,  COPD inhaler, Current Smoker,  Tracheocutaneous fistula S/P tracheostomy   Pulmonary exam normal breath sounds clear to auscultation       Cardiovascular hypertension, Pt. on medications and Pt. on home beta blockers Normal cardiovascular exam Rhythm:Regular Rate:Normal  EKG 11/11/19 NSR, borderline pronged QT interval  Echo 10/04/19 1. Left ventricular ejection fraction, by estimation, is 55 to 60%. The left ventricle has normal function. The left ventricle has no regional wall motion abnormalities. Left ventricular diastolic parameters are consistent with Grade II diastolic dysfunction (pseudonormalization).  2. Right ventricular systolic function is normal. The right ventricular size is normal. There is mildly elevated pulmonary artery systolic pressure.  3. Left atrial size was moderately dilated.  4. The mitral valve is normal in structure. Trivial mitral valve regurgitation.  5. The aortic valve is normal in structure. Aortic valve regurgitation is not visualized.  6. The inferior vena cava is normal in size with greater than 50% respiratory variability, suggesting right atrial pressure of 3 mmHg.     Neuro/Psych PSYCHIATRIC DISORDERS Dementia  Neuromuscular disease    GI/Hepatic GERD  Medicated,(+) Cirrhosis   Esophageal Varices and ascites  substance abuse  alcohol use, Hepatitis -, CAlcohol abuse in remission   Endo/Other  diabetes, Well Controlled, Type 2  Renal/GU Renal diseaseHx/o ARF  negative genitourinary    Musculoskeletal negative musculoskeletal ROS (+)   Abdominal   Peds  Hematology  (+) anemia , Thrombocytopenia   Anesthesia Other Findings   Reproductive/Obstetrics                                                            Anesthesia Evaluation  Patient identified by MRN, date of birth, ID band Patient awake    Reviewed: Allergy & Precautions, NPO status , Patient's Chart, lab work & pertinent test results  Airway Mallampati: II   Neck ROM: Full   Comment:  Hx of Laryngeal cancer, S/P tracheostomy now s/p decannulation  Dental  (+) Edentulous Upper, Edentulous Lower   Pulmonary COPD, Current Smoker and Patient abstained from smoking., former smoker,    Pulmonary exam normal        Cardiovascular hypertension, Normal cardiovascular exam  IMPRESSIONS    1. Left ventricular ejection fraction, by estimation, is 55 to 60%. The  left ventricle has normal function. The left ventricle has no regional  wall motion abnormalities. Left ventricular diastolic parameters are  consistent with Grade I diastolic  dysfunction (impaired relaxation).  2. Right ventricular systolic function is normal. The right ventricular  size is normal. There is mildly elevated pulmonary artery systolic  pressure.  3. Left atrial size was moderately dilated.  4. The mitral valve is normal in structure. Trivial mitral valve  regurgitation.  5. The aortic valve is normal in structure. Aortic valve regurgitation is  not visualized.  6. The inferior vena cava is normal in size  with greater than 50%  respiratory variability, suggesting right atrial pressure of 3 mmHg.    Neuro/Psych PSYCHIATRIC DISORDERS Dementia negative neurological ROS     GI/Hepatic negative GI ROS, (+) Cirrhosis     substance abuse  alcohol use, Hepatitis -, C  Endo/Other  negative endocrine ROS  Renal/GU Renal InsufficiencyRenal disease     Musculoskeletal negative  musculoskeletal ROS (+)   Abdominal   Peds  Hematology  (+) anemia ,  Thrombocytopenia, Plt 56k INR 1.4    Anesthesia Other Findings Hx laryngeal cancer s/p radiation, tracheostomy (since decannulated) Covid test negative   Reproductive/Obstetrics                         Anesthesia Physical Anesthesia Plan  ASA: III  Anesthesia Plan: MAC   Post-op Pain Management:    Induction: Intravenous  PONV Risk Score and Plan: 2 and Treatment may vary due to age or medical condition and Propofol infusion  Airway Management Planned: Simple Face Mask and Natural Airway  Additional Equipment: None  Intra-op Plan:   Post-operative Plan:   Informed Consent: I have reviewed the patients History and Physical, chart, labs and discussed the procedure including the risks, benefits and alternatives for the proposed anesthesia with the patient or authorized representative who has indicated his/her understanding and acceptance.     Dental advisory given  Plan Discussed with: CRNA and Anesthesiologist  Anesthesia Plan Comments:       Anesthesia Quick Evaluation  Anesthesia Physical Anesthesia Plan  ASA: 3  Anesthesia Plan: General   Post-op Pain Management:    Induction: Intravenous  PONV Risk Score and Plan: 4 or greater and Treatment may vary due to age or medical condition and Ondansetron  Airway Management Planned: Oral ETT  Additional Equipment: None  Intra-op Plan:   Post-operative Plan:   Informed Consent: I have reviewed the patients History and Physical, chart, labs and discussed the procedure including the risks, benefits and alternatives for the proposed anesthesia with the patient or authorized representative who has indicated his/her understanding and acceptance.   Patient has DNR.  Discussed DNR with power of attorney and Suspend DNR.     Plan Discussed with: CRNA and Anesthesiologist  Anesthesia Plan Comments: (PAT note  written by Myra Gianotti, PA-C. History of cirrhosis. For repeat labs on the day of surgery. )    Anesthesia Quick Evaluation

## 2020-09-25 ENCOUNTER — Other Ambulatory Visit (HOSPITAL_COMMUNITY)
Admission: RE | Admit: 2020-09-25 | Discharge: 2020-09-25 | Disposition: A | Discharge status: Home / Self Care | Payer: Medicaid Other | Source: Ambulatory Visit | Attending: Otolaryngology | Admitting: Otolaryngology

## 2020-09-25 DIAGNOSIS — Z01812 Encounter for preprocedural laboratory examination: Secondary | ICD-10-CM | POA: Insufficient documentation

## 2020-09-25 DIAGNOSIS — Z20822 Contact with and (suspected) exposure to covid-19: Secondary | ICD-10-CM | POA: Insufficient documentation

## 2020-09-25 LAB — SARS CORONAVIRUS 2 (TAT 6-24 HRS): SARS Coronavirus 2: NEGATIVE

## 2020-09-26 ENCOUNTER — Encounter (HOSPITAL_COMMUNITY): Payer: Self-pay | Admitting: Otolaryngology

## 2020-09-27 ENCOUNTER — Ambulatory Visit (HOSPITAL_COMMUNITY): Payer: Medicaid Other | Admitting: Vascular Surgery

## 2020-09-27 ENCOUNTER — Observation Stay (HOSPITAL_COMMUNITY)
Admission: RE | Admit: 2020-09-27 | Discharge: 2020-09-28 | Disposition: A | Discharge status: Home / Self Care | Payer: Medicaid Other | Source: Ambulatory Visit | Attending: Otolaryngology | Admitting: Otolaryngology

## 2020-09-27 ENCOUNTER — Encounter (HOSPITAL_COMMUNITY): Payer: Self-pay | Admitting: Otolaryngology

## 2020-09-27 ENCOUNTER — Other Ambulatory Visit: Payer: Self-pay

## 2020-09-27 ENCOUNTER — Encounter (HOSPITAL_COMMUNITY)
Admission: RE | Disposition: A | Discharge status: Home / Self Care | Payer: Self-pay | Source: Ambulatory Visit | Attending: Otolaryngology

## 2020-09-27 DIAGNOSIS — J449 Chronic obstructive pulmonary disease, unspecified: Secondary | ICD-10-CM | POA: Insufficient documentation

## 2020-09-27 DIAGNOSIS — F17210 Nicotine dependence, cigarettes, uncomplicated: Secondary | ICD-10-CM | POA: Diagnosis not present

## 2020-09-27 DIAGNOSIS — Q321 Other congenital malformations of trachea: Secondary | ICD-10-CM

## 2020-09-27 DIAGNOSIS — I10 Essential (primary) hypertension: Secondary | ICD-10-CM | POA: Diagnosis not present

## 2020-09-27 DIAGNOSIS — Z8521 Personal history of malignant neoplasm of larynx: Secondary | ICD-10-CM | POA: Insufficient documentation

## 2020-09-27 DIAGNOSIS — F0390 Unspecified dementia without behavioral disturbance: Secondary | ICD-10-CM | POA: Insufficient documentation

## 2020-09-27 DIAGNOSIS — J9504 Tracheo-esophageal fistula following tracheostomy: Principal | ICD-10-CM | POA: Insufficient documentation

## 2020-09-27 DIAGNOSIS — E119 Type 2 diabetes mellitus without complications: Secondary | ICD-10-CM | POA: Diagnosis not present

## 2020-09-27 DIAGNOSIS — D696 Thrombocytopenia, unspecified: Secondary | ICD-10-CM | POA: Insufficient documentation

## 2020-09-27 DIAGNOSIS — Z79899 Other long term (current) drug therapy: Secondary | ICD-10-CM | POA: Insufficient documentation

## 2020-09-27 HISTORY — PX: TRACHEOESOPHAGEAL FISTULA REPAIR: SHX2557

## 2020-09-27 LAB — GLUCOSE, CAPILLARY
Glucose-Capillary: 110 mg/dL — ABNORMAL HIGH (ref 70–99)
Glucose-Capillary: 202 mg/dL — ABNORMAL HIGH (ref 70–99)
Glucose-Capillary: 372 mg/dL — ABNORMAL HIGH (ref 70–99)
Glucose-Capillary: 287 mg/dL — ABNORMAL HIGH (ref 70–99)

## 2020-09-27 LAB — COMPREHENSIVE METABOLIC PANEL
ALT: 16 U/L (ref 0–44)
AST: 35 U/L (ref 15–41)
Albumin: 3 g/dL — ABNORMAL LOW (ref 3.5–5.0)
Alkaline Phosphatase: 66 U/L (ref 38–126)
Anion gap: 9 (ref 5–15)
BUN: 27 mg/dL — ABNORMAL HIGH (ref 6–20)
CO2: 22 mmol/L (ref 22–32)
Calcium: 9.6 mg/dL (ref 8.9–10.3)
Chloride: 98 mmol/L (ref 98–111)
Creatinine, Ser: 2.99 mg/dL — ABNORMAL HIGH (ref 0.44–1.00)
GFR, Estimated: 18 mL/min — ABNORMAL LOW (ref >60)
Glucose, Bld: 148 mg/dL — ABNORMAL HIGH (ref 70–99)
Potassium: 4.5 mmol/L (ref 3.5–5.1)
Sodium: 129 mmol/L — ABNORMAL LOW (ref 135–145)
Total Bilirubin: 2.1 mg/dL — ABNORMAL HIGH (ref 0.3–1.2)
Total Protein: 7.3 g/dL (ref 6.5–8.1)

## 2020-09-27 LAB — CBC
HCT: 35.7 % — ABNORMAL LOW (ref 36.0–46.0)
Hemoglobin: 12.3 g/dL (ref 12.0–15.0)
MCH: 32 pg (ref 26.0–34.0)
MCHC: 34.5 g/dL (ref 30.0–36.0)
MCV: 93 fL (ref 80.0–100.0)
Platelets: 83 10*3/uL — ABNORMAL LOW (ref 150–400)
RBC: 3.84 MIL/uL — ABNORMAL LOW (ref 3.87–5.11)
RDW: 17.8 % — ABNORMAL HIGH (ref 11.5–15.5)
WBC: 4.5 10*3/uL (ref 4.0–10.5)
nRBC: 0 % (ref 0.0–0.2)

## 2020-09-27 LAB — PROTIME-INR
INR: 1.3 — ABNORMAL HIGH (ref 0.8–1.2)
Prothrombin Time: 16.1 seconds — ABNORMAL HIGH (ref 11.4–15.2)

## 2020-09-27 SURGERY — TRACHEAL ESOPHAGEAL FISTULA REPAIR
Anesthesia: General | Site: Neck

## 2020-09-27 MED ORDER — ONDANSETRON HCL 4 MG/2ML IJ SOLN
INTRAMUSCULAR | Status: AC
  Filled 2020-09-27: qty 2

## 2020-09-27 MED ORDER — DIPHENHYDRAMINE-ZINC ACETATE 2-0.1 % EX CREA
1.0000 "application " | TOPICAL_CREAM | CUTANEOUS | Status: DC | PRN
  Filled 2020-09-27: qty 28

## 2020-09-27 MED ORDER — PHENYLEPHRINE 40 MCG/ML (10ML) SYRINGE FOR IV PUSH (FOR BLOOD PRESSURE SUPPORT)
PREFILLED_SYRINGE | INTRAVENOUS | Status: AC
  Filled 2020-09-27: qty 10

## 2020-09-27 MED ORDER — CEFAZOLIN SODIUM-DEXTROSE 2-3 GM-%(50ML) IV SOLR
INTRAVENOUS | Status: DC | PRN
  Administered 2020-09-27: 2 g via INTRAVENOUS

## 2020-09-27 MED ORDER — HYDROCORTISONE 1 % EX CREA
1.0000 "application " | TOPICAL_CREAM | Freq: Three times a day (TID) | CUTANEOUS | Status: DC
  Administered 2020-09-27 – 2020-09-28 (×2): 1 via TOPICAL
  Filled 2020-09-27: qty 28

## 2020-09-27 MED ORDER — MIDAZOLAM HCL 2 MG/2ML IJ SOLN
INTRAMUSCULAR | Status: AC
  Filled 2020-09-27: qty 2

## 2020-09-27 MED ORDER — ALBUTEROL SULFATE HFA 108 (90 BASE) MCG/ACT IN AERS
2.0000 | INHALATION_SPRAY | Freq: Four times a day (QID) | RESPIRATORY_TRACT | Status: DC | PRN
  Filled 2020-09-27: qty 6.7

## 2020-09-27 MED ORDER — SPIRONOLACTONE 25 MG PO TABS
100.0000 mg | ORAL_TABLET | ORAL | Status: DC
  Administered 2020-09-27: 100 mg via ORAL
  Filled 2020-09-27: qty 4

## 2020-09-27 MED ORDER — PROPOFOL 10 MG/ML IV BOLUS
INTRAVENOUS | Status: AC
  Filled 2020-09-27: qty 20

## 2020-09-27 MED ORDER — FOLIC ACID 1 MG PO TABS
1.0000 mg | ORAL_TABLET | Freq: Every day | ORAL | Status: DC
  Administered 2020-09-27 – 2020-09-28 (×2): 1 mg via ORAL
  Filled 2020-09-27 (×2): qty 1

## 2020-09-27 MED ORDER — CHLORHEXIDINE GLUCONATE 0.12 % MT SOLN: 15.0000 mL | Freq: Once | OROMUCOSAL | Status: DC

## 2020-09-27 MED ORDER — SODIUM BICARBONATE 650 MG PO TABS
650.0000 mg | ORAL_TABLET | Freq: Two times a day (BID) | ORAL | Status: DC
  Administered 2020-09-27 – 2020-09-28 (×3): 650 mg via ORAL
  Filled 2020-09-27 (×3): qty 1

## 2020-09-27 MED ORDER — EPHEDRINE SULFATE 50 MG/ML IJ SOLN
INTRAMUSCULAR | Status: DC | PRN
  Administered 2020-09-27 (×2): 5 mg via INTRAVENOUS

## 2020-09-27 MED ORDER — IBUPROFEN 100 MG/5ML PO SUSP
400.0000 mg | Freq: Four times a day (QID) | ORAL | Status: DC | PRN
  Administered 2020-09-27 – 2020-09-28 (×3): 400 mg via ORAL
  Filled 2020-09-27 (×6): qty 20

## 2020-09-27 MED ORDER — DEXAMETHASONE SODIUM PHOSPHATE 10 MG/ML IJ SOLN
INTRAMUSCULAR | Status: DC | PRN
  Administered 2020-09-27: 4 mg via INTRAVENOUS

## 2020-09-27 MED ORDER — PHENYLEPHRINE HCL-NACL 20-0.9 MG/250ML-% IV SOLN
INTRAVENOUS | Status: DC | PRN
  Administered 2020-09-27: 50 ug/min via INTRAVENOUS

## 2020-09-27 MED ORDER — PAROXETINE HCL 20 MG PO TABS
40.0000 mg | ORAL_TABLET | Freq: Every day | ORAL | Status: DC
  Administered 2020-09-28: 40 mg via ORAL
  Filled 2020-09-27: qty 2

## 2020-09-27 MED ORDER — LORATADINE 10 MG PO TABS
10.0000 mg | ORAL_TABLET | Freq: Every day | ORAL | Status: DC
  Administered 2020-09-27 – 2020-09-28 (×2): 10 mg via ORAL
  Filled 2020-09-27 (×2): qty 1

## 2020-09-27 MED ORDER — LACTULOSE 10 GM/15ML PO SOLN
30.0000 g | Freq: Every day | ORAL | Status: DC | PRN
  Filled 2020-09-27: qty 45

## 2020-09-27 MED ORDER — FENTANYL CITRATE (PF) 100 MCG/2ML IJ SOLN
INTRAMUSCULAR | Status: AC
  Filled 2020-09-27: qty 2

## 2020-09-27 MED ORDER — FAMOTIDINE 20 MG PO TABS
20.0000 mg | ORAL_TABLET | Freq: Every day | ORAL | Status: DC
  Administered 2020-09-28: 20 mg via ORAL
  Filled 2020-09-27: qty 1

## 2020-09-27 MED ORDER — ROCURONIUM BROMIDE 10 MG/ML (PF) SYRINGE
PREFILLED_SYRINGE | INTRAVENOUS | Status: DC | PRN
  Administered 2020-09-27: 40 mg via INTRAVENOUS

## 2020-09-27 MED ORDER — THIAMINE HCL 100 MG PO TABS
100.0000 mg | ORAL_TABLET | Freq: Every day | ORAL | Status: DC
  Administered 2020-09-27 – 2020-09-28 (×2): 100 mg via ORAL
  Filled 2020-09-27 (×2): qty 1

## 2020-09-27 MED ORDER — VITAMIN D (ERGOCALCIFEROL) 1.25 MG (50000 UNIT) PO CAPS
50000.0000 [IU] | ORAL_CAPSULE | ORAL | Status: DC
  Administered 2020-09-27: 50000 [IU] via ORAL
  Filled 2020-09-27: qty 1

## 2020-09-27 MED ORDER — DEXAMETHASONE SODIUM PHOSPHATE 10 MG/ML IJ SOLN
INTRAMUSCULAR | Status: AC
  Filled 2020-09-27: qty 1

## 2020-09-27 MED ORDER — HYDROCERIN EX CREA
1.0000 "application " | TOPICAL_CREAM | Freq: Two times a day (BID) | CUTANEOUS | Status: DC | PRN
  Filled 2020-09-27: qty 113

## 2020-09-27 MED ORDER — BACITRACIN ZINC 500 UNIT/GM EX OINT
TOPICAL_OINTMENT | CUTANEOUS | Status: AC
  Filled 2020-09-27: qty 28.35

## 2020-09-27 MED ORDER — LIDOCAINE-EPINEPHRINE (PF) 1 %-1:200000 IJ SOLN
INTRAMUSCULAR | Status: AC
  Filled 2020-09-27: qty 30

## 2020-09-27 MED ORDER — ONDANSETRON HCL 4 MG/2ML IJ SOLN
INTRAMUSCULAR | Status: DC | PRN
  Administered 2020-09-27: 4 mg via INTRAVENOUS

## 2020-09-27 MED ORDER — EPHEDRINE 5 MG/ML INJ
INTRAVENOUS | Status: AC
  Filled 2020-09-27: qty 5

## 2020-09-27 MED ORDER — FUROSEMIDE 40 MG PO TABS
40.0000 mg | ORAL_TABLET | ORAL | Status: DC
  Administered 2020-09-27: 40 mg via ORAL
  Filled 2020-09-27: qty 1

## 2020-09-27 MED ORDER — SUGAMMADEX SODIUM 200 MG/2ML IV SOLN
INTRAVENOUS | Status: DC | PRN
  Administered 2020-09-27: 200 mg via INTRAVENOUS

## 2020-09-27 MED ORDER — GABAPENTIN 100 MG PO CAPS
100.0000 mg | ORAL_CAPSULE | Freq: Three times a day (TID) | ORAL | Status: DC
  Administered 2020-09-27 – 2020-09-28 (×3): 100 mg via ORAL
  Filled 2020-09-27 (×3): qty 1

## 2020-09-27 MED ORDER — FENTANYL CITRATE (PF) 250 MCG/5ML IJ SOLN
INTRAMUSCULAR | Status: AC
  Filled 2020-09-27: qty 5

## 2020-09-27 MED ORDER — HYDROXYZINE HCL 25 MG PO TABS: 25.0000 mg | ORAL_TABLET | Freq: Four times a day (QID) | ORAL | Status: DC | PRN

## 2020-09-27 MED ORDER — INSULIN ASPART 100 UNIT/ML IJ SOLN
0.0000 [IU] | Freq: Three times a day (TID) | INTRAMUSCULAR | Status: DC
  Administered 2020-09-27: 15 [IU] via SUBCUTANEOUS
  Administered 2020-09-28: 3 [IU] via SUBCUTANEOUS

## 2020-09-27 MED ORDER — FENTANYL CITRATE (PF) 100 MCG/2ML IJ SOLN
25.0000 ug | INTRAMUSCULAR | Status: DC | PRN
  Administered 2020-09-27: 25 ug via INTRAVENOUS

## 2020-09-27 MED ORDER — NADOLOL 40 MG PO TABS
40.0000 mg | ORAL_TABLET | Freq: Every day | ORAL | Status: DC
  Administered 2020-09-28: 40 mg via ORAL
  Filled 2020-09-27: qty 1

## 2020-09-27 MED ORDER — LACTATED RINGERS IV SOLN: INTRAVENOUS | Status: DC

## 2020-09-27 MED ORDER — PROPOFOL 10 MG/ML IV BOLUS
INTRAVENOUS | Status: DC | PRN
  Administered 2020-09-27: 110 mg via INTRAVENOUS

## 2020-09-27 MED ORDER — DEXTROSE-NACL 5-0.9 % IV SOLN: INTRAVENOUS | Status: DC

## 2020-09-27 MED ORDER — OLOPATADINE HCL 0.1 % OP SOLN
1.0000 [drp] | Freq: Two times a day (BID) | OPHTHALMIC | Status: DC
  Administered 2020-09-27 – 2020-09-28 (×2): 1 [drp] via OPHTHALMIC
  Filled 2020-09-27: qty 5

## 2020-09-27 MED ORDER — LACTULOSE 10 GM/15ML PO SOLN
50.0000 g | Freq: Four times a day (QID) | ORAL | Status: DC
  Administered 2020-09-27 – 2020-09-28 (×3): 50 g via ORAL
  Filled 2020-09-27 (×4): qty 90

## 2020-09-27 MED ORDER — LIDOCAINE-EPINEPHRINE (PF) 1 %-1:200000 IJ SOLN
INTRAMUSCULAR | Status: DC | PRN
  Administered 2020-09-27: 1 mL

## 2020-09-27 MED ORDER — FENTANYL CITRATE (PF) 250 MCG/5ML IJ SOLN
INTRAMUSCULAR | Status: DC | PRN
  Administered 2020-09-27: 50 ug via INTRAVENOUS

## 2020-09-27 MED ORDER — LIDOCAINE 2% (20 MG/ML) 5 ML SYRINGE
INTRAMUSCULAR | Status: DC | PRN
  Administered 2020-09-27: 50 mg via INTRAVENOUS

## 2020-09-27 MED ORDER — LIDOCAINE 2% (20 MG/ML) 5 ML SYRINGE
INTRAMUSCULAR | Status: AC
  Filled 2020-09-27: qty 5

## 2020-09-27 MED ORDER — SUCCINYLCHOLINE CHLORIDE 200 MG/10ML IV SOSY
PREFILLED_SYRINGE | INTRAVENOUS | Status: DC | PRN
Start: 2020-09-27 — End: 2020-09-27
  Administered 2020-09-27: 100 mg via INTRAVENOUS

## 2020-09-27 MED ORDER — ACETAMINOPHEN 325 MG PO TABS
650.0000 mg | ORAL_TABLET | ORAL | Status: DC | PRN
  Administered 2020-09-27: 650 mg via ORAL
  Filled 2020-09-27: qty 2

## 2020-09-27 MED ORDER — ORAL CARE MOUTH RINSE: 15.0000 mL | Freq: Once | OROMUCOSAL | Status: DC

## 2020-09-27 MED ORDER — RIFAXIMIN 550 MG PO TABS
550.0000 mg | ORAL_TABLET | Freq: Two times a day (BID) | ORAL | Status: DC
  Administered 2020-09-27 – 2020-09-28 (×3): 550 mg via ORAL
  Filled 2020-09-27 (×3): qty 1

## 2020-09-27 SURGICAL SUPPLY — 34 items
BAG COUNTER SPONGE SURGICOUNT (BAG) ×2 IMPLANT
BLADE CLIPPER SURG (BLADE) IMPLANT
BLADE SURG 15 STRL LF DISP TIS (BLADE) IMPLANT
BLADE SURG 15 STRL SS (BLADE)
CANISTER SUCT 3000ML PPV (MISCELLANEOUS) ×2 IMPLANT
CLEANER TIP ELECTROSURG 2X2 (MISCELLANEOUS) ×2 IMPLANT
COVER SURGICAL LIGHT HANDLE (MISCELLANEOUS) ×2 IMPLANT
DECANTER SPIKE VIAL GLASS SM (MISCELLANEOUS) IMPLANT
DERMABOND ADVANCED (GAUZE/BANDAGES/DRESSINGS) ×1
DERMABOND ADVANCED .7 DNX12 (GAUZE/BANDAGES/DRESSINGS) ×1 IMPLANT
DRAPE HALF SHEET 40X57 (DRAPES) IMPLANT
ELECT COATED BLADE 2.86 ST (ELECTRODE) ×2 IMPLANT
ELECT REM PT RETURN 9FT ADLT (ELECTROSURGICAL) ×2
ELECTRODE REM PT RTRN 9FT ADLT (ELECTROSURGICAL) ×1 IMPLANT
GAUZE 4X4 16PLY ~~LOC~~+RFID DBL (SPONGE) ×2 IMPLANT
GLOVE SURG LTX SZ7.5 (GLOVE) ×2 IMPLANT
GOWN STRL REUS W/ TWL LRG LVL3 (GOWN DISPOSABLE) ×2 IMPLANT
GOWN STRL REUS W/TWL LRG LVL3 (GOWN DISPOSABLE) ×2
KIT BASIN OR (CUSTOM PROCEDURE TRAY) ×2 IMPLANT
KIT TURNOVER KIT B (KITS) ×2 IMPLANT
NEEDLE PRECISIONGLIDE 27X1.5 (NEEDLE) IMPLANT
NS IRRIG 1000ML POUR BTL (IV SOLUTION) ×2 IMPLANT
PAD ARMBOARD 7.5X6 YLW CONV (MISCELLANEOUS) ×4 IMPLANT
PENCIL FOOT CONTROL (ELECTRODE) ×2 IMPLANT
SUT CHROMIC 2 0 SH (SUTURE) IMPLANT
SUT CHROMIC 3 0 SH 27 (SUTURE) ×2 IMPLANT
SUT CHROMIC 4 0 RB 1X27 (SUTURE) ×2 IMPLANT
SUT ETHILON 3 0 PS 1 (SUTURE) IMPLANT
SUT SILK 4 0 REEL (SUTURE) IMPLANT
SYR 20ML LL LF (SYRINGE) ×2 IMPLANT
SYR CONTROL 10ML LL (SYRINGE) IMPLANT
TOWEL GREEN STERILE FF (TOWEL DISPOSABLE) ×2 IMPLANT
TRAY ENT MC OR (CUSTOM PROCEDURE TRAY) ×2 IMPLANT
WATER STERILE IRR 1000ML POUR (IV SOLUTION) ×2 IMPLANT

## 2020-09-27 NOTE — Op Note (Signed)
OPERATIVE REPORT  DATE OF SURGERY: 09/27/2020  PATIENT:  Judy Meza,  60 y.o. female  PRE-OPERATIVE DIAGNOSIS:  Tracheocutaneous fistula following tracheostomy  J95.04  POST-OPERATIVE DIAGNOSIS:  Tracheocutaneous fistula following tracheostomy   PROCEDURE:  Procedure(s): REPAIR OF TRACHEOCUTANEOIS FISTULA  SURGEON:  Beckie Salts, MD  ASSISTANTS: None  ANESTHESIA:   General   EBL: 30 ml  DRAINS: None  LOCAL MEDICATIONS USED: 1% Xylocaine with epinephrine  SPECIMEN:  none  COUNTS:  Correct  PROCEDURE DETAILS: The patient was taken to the operating room and placed on the operating table in the supine position. Following induction of general endotracheal anesthesia, The neck was prepped and draped in a standard fashion.  A fistula site was identified easily.  The rim of the fistula was infiltrated with local anesthetic solution.  #15 scalpel was used to incise the skin mucosa junction circumferentially.  Mucosa was undermined to allow tension-free closure.  Closure was accomplished using interrupted 4-0 chromic sutures.  Additional undermining was performed on the skin side and a Z-plasty was performed with an upper flap based on the left side and lower flap based on the right.  Closure was accomplished in 2 layers using interrupted 3-0 chromic in the deeper layer and then on the skin surface.  There is a nice tension-free closure.  All skin was viable.  Dermabond was applied.  Patient was awakened extubated and transferred to recovery in stable condition.    PATIENT DISPOSITION:  To PACU, stable

## 2020-09-27 NOTE — Progress Notes (Signed)
Attempted to call Dr. Janeice Robinson assistant Anderson Malta; left a voice mail and awaiting a response to get an order for an SSI. Pt's CBG was [phone] around dinner time. Will continue to monitor patient.

## 2020-09-27 NOTE — Progress Notes (Signed)
Postop check  No complaints, breathing and talking easily.  Neck looks excellent.  Blood sugars have been elevated.  She informed me that she was diagnosed with diabetes 2 days ago.  We will go ahead and get her on sliding scale insulin.  Plan overnight observation.

## 2020-09-27 NOTE — Transfer of Care (Signed)
Immediate Anesthesia Transfer of Care Note  Patient: Judy Meza  Procedure(s) Performed: REPAIR OF TRACHEOCUTANEOIS FISTULA (Neck)  Patient Location: PACU  Anesthesia Type:General  Level of Consciousness: awake and alert   Airway & Oxygen Therapy: Patient Spontanous Breathing and Patient connected to face mask oxygen  Post-op Assessment: Report given to RN and Post -op Vital signs reviewed and stable  Post vital signs: Reviewed and stable  Last Vitals:  Vitals Value Taken Time  BP 168/105 09/27/20 0838  Temp    Pulse 68 09/27/20 0841  Resp 23 09/27/20 0841  SpO2 97 % 09/27/20 0841  Vitals shown include unvalidated device data.  Last Pain:  Vitals:   09/27/20 0649  TempSrc:   PainSc: 0-No pain         Complications: No notable events documented.

## 2020-09-27 NOTE — Progress Notes (Signed)
[facility] contacted to find out last time pt took all medications. Nursing home staff not able to give RN any information about last doses for meds. All information about last doses were provided by pt's sister, Shirlean Mylar, who was with pt last night and this morning. There are some medications that she was not sure when pt took last.

## 2020-09-27 NOTE — Interval H&P Note (Signed)
History and Physical Interval Note:  09/27/2020 7:19 AM  [name]  has presented today for surgery, with the diagnosis of Tracheocutaneous fistula following tracheostomy  J95.04.  The various methods of treatment have been discussed with the patient and family. After consideration of risks, benefits and other options for treatment, the patient has consented to  Procedure(s): REPAIR OF TRACHEOCUTANEOIS FISTULA (N/A) as a surgical intervention.  The patient's history has been reviewed, patient examined, no change in status, stable for surgery.  I have reviewed the patient's chart and labs.  Questions were answered to the patient's satisfaction.     Judy Meza

## 2020-09-27 NOTE — Anesthesia Procedure Notes (Signed)
Procedure Name: Intubation Date/Time: 09/27/2020 7:43 AM Performed by: Inda Coke, CRNA Pre-anesthesia Checklist: Patient identified, Emergency Drugs available, Suction available and Patient being monitored Patient Re-evaluated:Patient Re-evaluated prior to induction Oxygen Delivery Method: Circle System Utilized Preoxygenation: Pre-oxygenation with 100% oxygen Induction Type: IV induction and Rapid sequence Ventilation: Mask ventilation without difficulty Laryngoscope Size: Mac and 3 Grade View: Grade I Tube type: Oral Tube size: 6.5 mm Number of attempts: 1 Airway Equipment and Method: Stylet and Oral airway Placement Confirmation: ETT inserted through vocal cords under direct vision, positive ETCO2 and breath sounds checked- equal and bilateral Secured at: 20 cm Tube secured with: Tape Dental Injury: Teeth and Oropharynx as per pre-operative assessment

## 2020-09-27 NOTE — Anesthesia Postprocedure Evaluation (Signed)
Anesthesia Post Note  Patient: Judy Meza  Procedure(s) Performed: REPAIR OF TRACHEOCUTANEOIS FISTULA (Neck)     Patient location during evaluation: PACU Anesthesia Type: General Level of consciousness: awake and alert Pain management: pain level controlled Vital Signs Assessment: post-procedure vital signs reviewed and stable Respiratory status: spontaneous breathing, nonlabored ventilation and respiratory function stable Cardiovascular status: blood pressure returned to baseline and stable Postop Assessment: no apparent nausea or vomiting Anesthetic complications: no   No notable events documented.  Last Vitals:  Vitals:   09/27/20 0845 09/27/20 0900  BP: (!) 164/100 (!) 152/91  Pulse: 66 67  Resp: 19 19  Temp: 36.7 C   SpO2: 97% 92%    Last Pain:  Vitals:   09/27/20 0900  TempSrc:   PainSc: 7                  Armie Moren A.

## 2020-09-28 ENCOUNTER — Encounter (HOSPITAL_COMMUNITY): Payer: Self-pay | Admitting: Otolaryngology

## 2020-09-28 DIAGNOSIS — J9504 Tracheo-esophageal fistula following tracheostomy: Secondary | ICD-10-CM | POA: Diagnosis not present

## 2020-09-28 LAB — GLUCOSE, CAPILLARY: Glucose-Capillary: 173 mg/dL — ABNORMAL HIGH (ref 70–99)

## 2020-09-28 MED ORDER — HYDROCODONE-ACETAMINOPHEN 7.5-325 MG PO TABS: 1.0000 | ORAL_TABLET | Freq: Four times a day (QID) | ORAL | 0 refills | Status: DC | PRN

## 2020-09-28 MED ORDER — ONDANSETRON 8 MG PO TBDP: 8.0000 mg | ORAL_TABLET | Freq: Three times a day (TID) | ORAL | 0 refills | Status: DC | PRN

## 2020-09-28 NOTE — Discharge Instructions (Signed)
You may shower and use soap and water. Do not use any creams, oils or ointment. ° °

## 2020-09-28 NOTE — Discharge Summary (Signed)
Physician Discharge Summary  Patient ID: Judy Meza MRN: [id number] DOB/AGE: March 08, 1960 60 y.o.  Admit date: 09/27/2020 Discharge date: 09/28/2020  Admission Diagnoses: tracheocutaneous fistula  Discharge Diagnoses:  Active Problems:   Tracheo-cutaneous fistula   Discharged Condition: good  Hospital Course: no complications  Consults: none  Significant Diagnostic Studies: none  Treatments: surgery: repair tracheocutaneous fistula  Discharge Exam: Blood pressure (!) 127/91, pulse 75, temperature 98.5 F (36.9 C), temperature source Oral, resp. rate 18, height 5\' 1"  (1.549 m), weight 70.4 kg, SpO2 97 %. PHYSICAL EXAM: Awake and alert, surgical site excellent, no infection. Breathing is clear.  Disposition: Discharge disposition: 01-Home or Self Care       Discharge Instructions     Diet - low sodium heart healthy   Complete by: As directed    Increase activity slowly   Complete by: As directed    No wound care   Complete by: As directed       Allergies as of 09/28/2020   No Known Allergies      Medication List     TAKE these medications    acetaminophen 325 MG tablet Commonly known as: TYLENOL Take 650 mg by mouth every 4 (four) hours as needed for mild pain.   albuterol 108 (90 Base) MCG/ACT inhaler Commonly known as: VENTOLIN HFA Inhale 2 puffs into the lungs every 6 (six) hours as needed for shortness of breath.   Benadryl Itch Stopping cream Generic drug: diphenhydrAMINE-zinc acetate Apply 1 application topically every 4 (four) hours as needed for itching.   ergocalciferol 1.25 MG (50000 UT) capsule Commonly known as: VITAMIN D2 Take 50,000 Units by mouth every Friday. Fridays   eucerin cream Apply 1 application topically every 12 (twelve) hours as needed for dry skin.   famotidine 20 MG tablet Commonly known as: PEPCID Take 20 mg by mouth daily.   folic acid 1 MG tablet Commonly known as: FOLVITE Take 1 tablet (1 mg total) by  mouth daily.   furosemide 40 MG tablet Commonly known as: LASIX Take 40 mg by mouth every Monday, Wednesday, and Friday.   gabapentin 100 MG capsule Commonly known as: NEURONTIN Take 100 mg by mouth 3 (three) times daily.   HYDROcodone-acetaminophen 7.5-325 MG tablet Commonly known as: Norco Take 1 tablet by mouth every 6 (six) hours as needed for moderate pain.   hydrocortisone 2.5 % cream Apply 1 application topically every 8 (eight) hours. Apply to back of neck every shift.   hydrOXYzine 25 MG tablet Commonly known as: ATARAX/VISTARIL Take 25 mg by mouth every 6 (six) hours as needed for itching.   lactulose 10 GM/15ML solution Commonly known as: CHRONULAC Take 50 g by mouth in the morning, at noon, in the evening, and at bedtime.   loratadine 10 MG tablet Commonly known as: CLARITIN Take 10 mg by mouth daily.   nadolol 40 MG tablet Commonly known as: Corgard Take 1 tablet (40 mg total) by mouth daily.   Olopatadine HCl 0.2 % Soln Place 1 drop into both eyes daily as needed (allergic conjunctivitis).   ondansetron 8 MG disintegrating tablet Commonly known as: Zofran ODT Take 1 tablet (8 mg total) by mouth every 8 (eight) hours as needed for nausea or vomiting.   PARoxetine 40 MG tablet Commonly known as: PAXIL Take 40 mg by mouth daily.   rifaximin 550 MG Tabs tablet Commonly known as: XIFAXAN Take 1 tablet (550 mg total) by mouth 2 (two) times daily.   sodium bicarbonate  650 MG tablet Take 1 tablet (650 mg total) by mouth 2 (two) times daily.   spironolactone 100 MG tablet Commonly known as: ALDACTONE Take 1 tablet (100 mg total) by mouth every Monday, Wednesday, and Friday.   thiamine 100 MG tablet Take 1 tablet (100 mg total) by mouth daily.        Follow-up Information     Izora Gala, MD. Schedule an appointment as soon as possible for a visit in 3 week(s).   Specialty: Otolaryngology Contact information: [address] [place] [address] [phone]                 Signed: Izora Gala 09/28/2020, 8:15 AM

## 2020-09-28 NOTE — Progress Notes (Signed)
Patient was transported via wheelchair by NT for discharge home; in no acute distress nor complaints of pain nor discomfort; incision on her anterior neck is skin glued and is clean, dry and intact; room was checked and accounted for all her belongings; discharge instructions like wound care and medications given to her sister who is her legal guardian and she verbalized understanding on the instructions given.

## 2020-10-28 ENCOUNTER — Other Ambulatory Visit: Payer: Self-pay

## 2020-10-28 ENCOUNTER — Inpatient Hospital Stay (HOSPITAL_COMMUNITY)
Admission: EM | Admit: 2020-10-28 | Discharge: 2020-11-01 | DRG: 638 | Disposition: A | Discharge status: Skilled Nursing Facility | Payer: Medicaid Other | Attending: Internal Medicine | Admitting: Internal Medicine

## 2020-10-28 ENCOUNTER — Emergency Department (HOSPITAL_COMMUNITY): Payer: Medicaid Other

## 2020-10-28 ENCOUNTER — Encounter (HOSPITAL_COMMUNITY): Payer: Self-pay

## 2020-10-28 DIAGNOSIS — W19XXXA Unspecified fall, initial encounter: Secondary | ICD-10-CM

## 2020-10-28 DIAGNOSIS — K219 Gastro-esophageal reflux disease without esophagitis: Secondary | ICD-10-CM | POA: Diagnosis present

## 2020-10-28 DIAGNOSIS — Z79899 Other long term (current) drug therapy: Secondary | ICD-10-CM

## 2020-10-28 DIAGNOSIS — E1100 Type 2 diabetes mellitus with hyperosmolarity without nonketotic hyperglycemic-hyperosmolar coma (NKHHC): Secondary | ICD-10-CM | POA: Diagnosis not present

## 2020-10-28 DIAGNOSIS — N184 Chronic kidney disease, stage 4 (severe): Secondary | ICD-10-CM | POA: Diagnosis present

## 2020-10-28 DIAGNOSIS — Z20822 Contact with and (suspected) exposure to covid-19: Secondary | ICD-10-CM | POA: Diagnosis present

## 2020-10-28 DIAGNOSIS — F0390 Unspecified dementia without behavioral disturbance: Secondary | ICD-10-CM | POA: Diagnosis present

## 2020-10-28 DIAGNOSIS — B182 Chronic viral hepatitis C: Secondary | ICD-10-CM | POA: Diagnosis not present

## 2020-10-28 DIAGNOSIS — F1021 Alcohol dependence, in remission: Secondary | ICD-10-CM | POA: Diagnosis present

## 2020-10-28 DIAGNOSIS — Z8616 Personal history of COVID-19: Secondary | ICD-10-CM

## 2020-10-28 DIAGNOSIS — B1920 Unspecified viral hepatitis C without hepatic coma: Secondary | ICD-10-CM | POA: Diagnosis present

## 2020-10-28 DIAGNOSIS — J449 Chronic obstructive pulmonary disease, unspecified: Secondary | ICD-10-CM | POA: Diagnosis present

## 2020-10-28 DIAGNOSIS — Z515 Encounter for palliative care: Secondary | ICD-10-CM

## 2020-10-28 DIAGNOSIS — D61818 Other pancytopenia: Secondary | ICD-10-CM | POA: Diagnosis present

## 2020-10-28 DIAGNOSIS — N189 Chronic kidney disease, unspecified: Secondary | ICD-10-CM | POA: Diagnosis not present

## 2020-10-28 DIAGNOSIS — E871 Hypo-osmolality and hyponatremia: Secondary | ICD-10-CM | POA: Diagnosis present

## 2020-10-28 DIAGNOSIS — E1122 Type 2 diabetes mellitus with diabetic chronic kidney disease: Secondary | ICD-10-CM | POA: Diagnosis present

## 2020-10-28 DIAGNOSIS — Z930 Tracheostomy status: Secondary | ICD-10-CM | POA: Diagnosis not present

## 2020-10-28 DIAGNOSIS — K7031 Alcoholic cirrhosis of liver with ascites: Secondary | ICD-10-CM | POA: Diagnosis present

## 2020-10-28 DIAGNOSIS — Z923 Personal history of irradiation: Secondary | ICD-10-CM

## 2020-10-28 DIAGNOSIS — G4733 Obstructive sleep apnea (adult) (pediatric): Secondary | ICD-10-CM | POA: Diagnosis present

## 2020-10-28 DIAGNOSIS — K766 Portal hypertension: Secondary | ICD-10-CM | POA: Diagnosis present

## 2020-10-28 DIAGNOSIS — N179 Acute kidney failure, unspecified: Secondary | ICD-10-CM | POA: Diagnosis present

## 2020-10-28 DIAGNOSIS — F17210 Nicotine dependence, cigarettes, uncomplicated: Secondary | ICD-10-CM | POA: Diagnosis present

## 2020-10-28 DIAGNOSIS — E860 Dehydration: Secondary | ICD-10-CM | POA: Diagnosis present

## 2020-10-28 DIAGNOSIS — E1165 Type 2 diabetes mellitus with hyperglycemia: Principal | ICD-10-CM | POA: Diagnosis present

## 2020-10-28 DIAGNOSIS — M25572 Pain in left ankle and joints of left foot: Secondary | ICD-10-CM | POA: Diagnosis not present

## 2020-10-28 DIAGNOSIS — Z8521 Personal history of malignant neoplasm of larynx: Secondary | ICD-10-CM

## 2020-10-28 DIAGNOSIS — Z66 Do not resuscitate: Secondary | ICD-10-CM | POA: Diagnosis present

## 2020-10-28 DIAGNOSIS — I129 Hypertensive chronic kidney disease with stage 1 through stage 4 chronic kidney disease, or unspecified chronic kidney disease: Secondary | ICD-10-CM | POA: Diagnosis present

## 2020-10-28 DIAGNOSIS — D6959 Other secondary thrombocytopenia: Secondary | ICD-10-CM | POA: Diagnosis present

## 2020-10-28 DIAGNOSIS — G912 (Idiopathic) normal pressure hydrocephalus: Secondary | ICD-10-CM | POA: Diagnosis present

## 2020-10-28 DIAGNOSIS — R079 Chest pain, unspecified: Secondary | ICD-10-CM

## 2020-10-28 LAB — CBC WITH DIFFERENTIAL/PLATELET
Abs Immature Granulocytes: 0.15 10*3/uL — ABNORMAL HIGH (ref 0.00–0.07)
Basophils Absolute: 0 10*3/uL (ref 0.0–0.1)
Basophils Relative: 0 %
Eosinophils Absolute: 0 10*3/uL (ref 0.0–0.5)
Eosinophils Relative: 0 %
HCT: 40.3 % (ref 36.0–46.0)
Hemoglobin: 13.9 g/dL (ref 12.0–15.0)
Immature Granulocytes: 2 %
Lymphocytes Relative: 9 %
Lymphs Abs: 0.9 10*3/uL (ref 0.7–4.0)
MCH: 31.9 pg (ref 26.0–34.0)
MCHC: 34.5 g/dL (ref 30.0–36.0)
MCV: 92.4 fL (ref 80.0–100.0)
Monocytes Absolute: 1 10*3/uL (ref 0.1–1.0)
Monocytes Relative: 10 %
Neutro Abs: 7.7 10*3/uL (ref 1.7–7.7)
Neutrophils Relative %: 79 %
Platelets: 88 10*3/uL — ABNORMAL LOW (ref 150–400)
RBC: 4.36 MIL/uL (ref 3.87–5.11)
RDW: 14.5 % (ref 11.5–15.5)
WBC: 9.8 10*3/uL (ref 4.0–10.5)
nRBC: 0.2 % (ref 0.0–0.2)

## 2020-10-28 LAB — I-STAT VENOUS BLOOD GAS, ED
Acid-base deficit: 1 mmol/L (ref 0.0–2.0)
Bicarbonate: 24.7 mmol/L (ref 20.0–28.0)
Calcium, Ion: 1.08 mmol/L — ABNORMAL LOW (ref 1.15–1.40)
HCT: 46 % (ref 36.0–46.0)
Hemoglobin: 15.6 g/dL — ABNORMAL HIGH (ref 12.0–15.0)
O2 Saturation: 44 %
Potassium: 4.5 mmol/L (ref 3.5–5.1)
Sodium: 116 mmol/L — CL (ref 135–145)
TCO2: 26 mmol/L (ref 22–32)
pCO2, Ven: 45 mmHg (ref 44.0–60.0)
pH, Ven: 7.348 (ref 7.250–7.430)
pO2, Ven: 26 mmHg — CL (ref 32.0–45.0)

## 2020-10-28 LAB — URINALYSIS, ROUTINE W REFLEX MICROSCOPIC
Bilirubin Urine: NEGATIVE
Glucose, UA: >=500 mg/dL — AB
Hgb urine dipstick: NEGATIVE
Ketones, ur: NEGATIVE mg/dL
Nitrite: NEGATIVE
Protein, ur: NEGATIVE mg/dL
Specific Gravity, Urine: 1.015 (ref 1.005–1.030)
pH: 6 (ref 5.0–8.0)

## 2020-10-28 LAB — COMPREHENSIVE METABOLIC PANEL
ALT: 28 U/L (ref 0–44)
AST: 30 U/L (ref 15–41)
Albumin: 3.2 g/dL — ABNORMAL LOW (ref 3.5–5.0)
Alkaline Phosphatase: 98 U/L (ref 38–126)
Anion gap: 12 (ref 5–15)
BUN: 53 mg/dL — ABNORMAL HIGH (ref 6–20)
CO2: 24 mmol/L (ref 22–32)
Calcium: 9.8 mg/dL (ref 8.9–10.3)
Chloride: 80 mmol/L — ABNORMAL LOW (ref 98–111)
Creatinine, Ser: 4.03 mg/dL — ABNORMAL HIGH (ref 0.44–1.00)
GFR, Estimated: 12 mL/min — ABNORMAL LOW (ref >60)
Glucose, Bld: 755 mg/dL (ref 70–99)
Potassium: 4.6 mmol/L (ref 3.5–5.1)
Sodium: 116 mmol/L — CL (ref 135–145)
Total Bilirubin: 2.7 mg/dL — ABNORMAL HIGH (ref 0.3–1.2)
Total Protein: 8.4 g/dL — ABNORMAL HIGH (ref 6.5–8.1)

## 2020-10-28 LAB — RESP PANEL BY RT-PCR (FLU A&B, COVID) ARPGX2
Influenza A by PCR: NEGATIVE
Influenza B by PCR: NEGATIVE
SARS Coronavirus 2 by RT PCR: NEGATIVE

## 2020-10-28 LAB — TROPONIN I (HIGH SENSITIVITY)
Troponin I (High Sensitivity): 34 ng/L — ABNORMAL HIGH (ref <18)
Troponin I (High Sensitivity): 31 ng/L — ABNORMAL HIGH (ref <18)

## 2020-10-28 LAB — CBG MONITORING, ED
Glucose-Capillary: >600 mg/dL (ref 70–99)
Glucose-Capillary: >600 mg/dL (ref 70–99)

## 2020-10-28 LAB — PROTIME-INR
INR: 1.3 — ABNORMAL HIGH (ref 0.8–1.2)
Prothrombin Time: 16 seconds — ABNORMAL HIGH (ref 11.4–15.2)

## 2020-10-28 LAB — LIPASE, BLOOD: Lipase: 68 U/L — ABNORMAL HIGH (ref 11–51)

## 2020-10-28 LAB — AMMONIA: Ammonia: 44 umol/L — ABNORMAL HIGH (ref 9–35)

## 2020-10-28 MED ORDER — SODIUM CHLORIDE 0.9 % IV BOLUS
1000.0000 mL | Freq: Once | INTRAVENOUS | Status: AC
  Administered 2020-10-28: 1000 mL via INTRAVENOUS

## 2020-10-28 MED ORDER — DEXTROSE 50 % IV SOLN: 0.0000 mL | INTRAVENOUS | Status: DC | PRN

## 2020-10-28 MED ORDER — INSULIN REGULAR(HUMAN) IN NACL 100-0.9 UT/100ML-% IV SOLN
INTRAVENOUS | Status: DC
Start: 2020-10-28 — End: 2020-10-28

## 2020-10-28 MED ORDER — DEXTROSE IN LACTATED RINGERS 5 % IV SOLN: INTRAVENOUS | Status: DC

## 2020-10-28 MED ORDER — LACTATED RINGERS IV SOLN
INTRAVENOUS | Status: DC
Start: 2020-10-28 — End: 2020-10-28

## 2020-10-28 MED ORDER — LACTATED RINGERS IV SOLN: INTRAVENOUS | Status: DC

## 2020-10-28 MED ORDER — ENOXAPARIN SODIUM 30 MG/0.3ML IJ SOSY: 30.0000 mg | PREFILLED_SYRINGE | Freq: Every day | INTRAMUSCULAR | Status: DC

## 2020-10-28 MED ORDER — POTASSIUM CHLORIDE 10 MEQ/100ML IV SOLN
10.0000 meq | INTRAVENOUS | Status: AC
  Administered 2020-10-28 – 2020-10-29 (×2): 10 meq via INTRAVENOUS
  Filled 2020-10-28 (×2): qty 100

## 2020-10-28 MED ORDER — INSULIN REGULAR(HUMAN) IN NACL 100-0.9 UT/100ML-% IV SOLN
INTRAVENOUS | Status: DC
  Administered 2020-10-28: 9 [IU]/h via INTRAVENOUS
  Filled 2020-10-28: qty 100

## 2020-10-28 NOTE — ED Provider Notes (Signed)
[facility] EMERGENCY DEPARTMENT Provider Note   CSN: [id number] Arrival date & time: 10/28/20  1452     History Chief Complaint  Patient presents with   Hyperglycemia    Judy Meza is a 60 y.o. female.  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  Patient with procedure in September 2022 to try to close tracheocutaneous fistula at the site of her previous tracheostomy.  During preop for that, she was found to have new onset diabetes with an A1c of 7.2.  She is currently on semaglutide.  Over the past couple of weeks patient was started on prednisone for a rash in her rectal area.  She was found to have very high blood sugars and EMS was called.  She was given 14 units of Humalog prior to arrival.  CBG 580 with EMS.  Patient has vague complaints of not feeling well.  She states that she vomited 1 time today.  No significant abdominal pain.  No fevers or URI symptoms reported.  She does endorse being very thirsty and urinating a lot.  The onset of this condition was acute. The course is constant. Aggravating factors: prednisone. Alleviating factors: none.        Past Medical History:  Diagnosis Date   Acute kidney failure (HCC)    Chronic hepatitis C ([place])    Chronic kidney disease, stage 3 (HCC)    Cirrhosis (HCC)    COPD (chronic obstructive pulmonary disease) ([place])    COVID-19 virus infection    Diabetes mellitus without complication (HCC)    A2N 7.2% 09/17/20   GERD (gastroesophageal reflux disease)    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   I   Hypertension    Liver disease     Normal pressure hydrocephalus (HCC)    Renal disorder    Sleep apnea    uses O2 at night   Thrombocytopenia (HCC)    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    Patient Active Problem List   Diagnosis Date Noted   Tracheo-cutaneous fistula 09/27/2020   End stage liver disease ([place]) 11/11/2019   Secondary esophageal varices with bleeding (HCC)    Esophageal stricture    Cirrhosis (HCC)    Decompensated hepatic cirrhosis ([place]) 10/03/2019   Acute on chronic anemia 10/03/2019   Cancer screening 08/14/2019   Back pain with radiculopathy 08/07/2019   Recurrent falls 08/04/2019   Multiple falls at home 08/03/2019   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   Goals of care, counseling/discussion 07/28/2019   Acute encephalopathy 06/05/2019   Chest pain 06/04/2019   QT prolongation 05/39/7673   Metabolic acidosis 41/93/7902   Dementia without behavioral disturbance ([place]) 05/10/2019   Chronic hepatitis C ([place]) 05/10/2019   Hepatic encephalopathy 05/09/2019   Pancytopenia, acquired ([place])    Cirrhosis of liver ([place])    CKD (chronic kidney disease), stage III ([place]) 04/29/2019   COPD with chronic bronchitis ([place]) 04/29/2019   Tobacco abuse 04/29/2019   Alcoholism in remission ([place]) 04/29/2019   Acute hepatic encephalopathy 04/29/2019   AKI (acute kidney injury) ([place]) 40/97/3532   Alcoholic cirrhosis of liver with ascites ([place]) 04/29/2019   Thrombocytopenia ([place]) 04/29/2019   Hyperkalemia 04/29/2019  Past Surgical History:  Procedure Laterality Date   ESOPHAGEAL BANDING  10/04/2019   Procedure: ESOPHAGEAL BANDING;  Surgeon: Lavena Bullion, DO;  Location: [facility]OSCOPY;  Service: Gastroenterology;;   ESOPHAGOGASTRODUODENOSCOPY (EGD) WITH PROPOFOL N/A 10/04/2019   Procedure: ESOPHAGOGASTRODUODENOSCOPY (EGD) WITH PROPOFOL;  Surgeon: Lavena Bullion, DO;  Location: [facility];  Service: Gastroenterology;  Laterality: N/A;   HOT HEMOSTASIS N/A  10/04/2019   Procedure: HOT HEMOSTASIS (ARGON PLASMA COAGULATION/BICAP);  Surgeon: Lavena Bullion, DO;  Location: [facility] ENDOSCOPY;  Service: Gastroenterology;  Laterality: N/A;   PORT-A-CATH REMOVAL     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/2020   Procedure: REPAIR OF TRACHEOCUTANEOIS FISTULA;  Surgeon: Izora Gala, MD;  Location: [facility] OR;  Service: ENT;  Laterality: N/A;     OB History     Gravida  2   Para      Term      Preterm      AB      Living  1      SAB      IAB      Ectopic      Multiple      Live Births              Family History  Problem Relation Age of Onset   Healthy Mother    Hypertension Other     Social History   Tobacco Use   Smoking status: Every Day    Packs/day: 0.25    Types: Cigarettes   Smokeless tobacco: Never   Tobacco comments:    3 per day  Vaping Use   Vaping Use: Never used  Substance Use Topics   Alcohol use: Not Currently   Drug use: Not Currently    Home Medications Prior to Admission medications   Medication Sig Start Date End Date Taking? Authorizing Provider  acetaminophen (TYLENOL) 325 MG tablet Take 650 mg by mouth every 4 (four) hours as needed for mild pain.    [provider]  albuterol (VENTOLIN HFA) 108 (90 Base) MCG/ACT inhaler Inhale 2 puffs into the lungs every 6 (six) hours as needed for shortness of breath.    [provider]  diphenhydrAMINE-zinc acetate (BENADRYL ITCH STOPPING) cream Apply 1 application topically every 4 (four) hours as needed for itching.    [provider]  ergocalciferol (VITAMIN D2) 1.25 MG (50000 UT) capsule Take 50,000 Units by mouth every Friday. Fridays    [provider]  famotidine (PEPCID) 20 MG tablet Take 20 mg by mouth daily.    [provider]  folic acid (FOLVITE) 1 MG tablet Take 1 tablet (1 mg total) by mouth daily. 10/09/19   Andrew Au, MD  furosemide (LASIX) 40 MG tablet Take 40 mg by mouth every Monday,  Wednesday, and Friday.    [provider]  gabapentin (NEURONTIN) 100 MG capsule Take 100 mg by mouth 3 (three) times daily.    [provider]  HYDROcodone-acetaminophen (NORCO) 7.5-325 MG tablet Take 1 tablet by mouth every 6 (six) hours as needed for moderate pain. 09/28/20   Izora Gala, MD  hydrocortisone 2.5 % cream Apply 1 application topically every 8 (eight) hours. Apply to back of neck every shift.    [provider]  hydrOXYzine (ATARAX/VISTARIL) 25 MG tablet Take 25 mg by mouth every 6 (six) hours as needed for itching.    [provider]  lactulose (CHRONULAC) 10 GM/15ML solution Take 50 g by mouth in the morning, at noon,  in the evening, and at bedtime.    [provider]  loratadine (CLARITIN) 10 MG tablet Take 10 mg by mouth daily.    [provider]  nadolol (CORGARD) 40 MG tablet Take 1 tablet (40 mg total) by mouth daily. 01/22/20   Cirigliano, Vito V, DO  Olopatadine HCl 0.2 % SOLN Place 1 drop into both eyes daily as needed (allergic conjunctivitis).    [provider]  ondansetron (ZOFRAN ODT) 8 MG disintegrating tablet Take 1 tablet (8 mg total) by mouth every 8 (eight) hours as needed for nausea or vomiting. 09/28/20   Izora Gala, MD  PARoxetine (PAXIL) 40 MG tablet Take 40 mg by mouth daily.    [provider]  rifaximin (XIFAXAN) 550 MG TABS tablet Take 1 tablet (550 mg total) by mouth 2 (two) times daily. 01/22/20   Cirigliano, Vito V, DO  Skin Protectants, Misc. (EUCERIN) cream Apply 1 application topically every 12 (twelve) hours as needed for dry skin.    [provider]  sodium bicarbonate 650 MG tablet Take 1 tablet (650 mg total) by mouth 2 (two) times daily. 08/09/19   Hosie Poisson, MD  spironolactone (ALDACTONE) 100 MG tablet Take 1 tablet (100 mg total) by mouth every Monday, Wednesday, and Friday. 10/30/19   Florencia Reasons, MD  thiamine 100 MG tablet Take 1 tablet (100 mg total) by mouth  daily. 06/08/19   Mendel Corning, MD    Allergies    Patient has no known allergies.  Review of Systems   Review of Systems  Constitutional:  Positive for fatigue. Negative for fever.  HENT:  Negative for rhinorrhea and sore throat.   Eyes:  Negative for redness.  Respiratory:  Negative for cough.   Cardiovascular:  Negative for chest pain.  Gastrointestinal:  Positive for nausea and vomiting. Negative for abdominal pain and diarrhea.  Endocrine: Positive for polydipsia and polyuria.  Genitourinary:  Positive for frequency. Negative for dysuria, hematuria and urgency.  Musculoskeletal:  Negative for myalgias.  Skin:  Negative for rash.  Neurological:  Negative for headaches.   Physical Exam Updated Vital Signs BP (!) 131/96   Pulse 60   Resp 15   Ht 5\' 1"  (1.549 m)   Wt 70.4 kg   SpO2 97%   BMI 29.33 kg/m   Physical Exam Vitals and nursing note reviewed.  Constitutional:      General: She is not in acute distress.    Appearance: She is well-developed.  HENT:     Head: Normocephalic and atraumatic.     Right Ear: External ear normal.     Left Ear: External ear normal.     Nose: Nose normal.     Mouth/Throat:     Mouth: Mucous membranes are dry.     Comments: Very dry mucous membranes. Eyes:     Conjunctiva/sclera: Conjunctivae normal.  Neck:     Comments: Tracheostomy covered with bandaging in place. Cardiovascular:     Rate and Rhythm: Normal rate and regular rhythm.     Heart sounds: No murmur heard. Pulmonary:     Effort: No respiratory distress.     Breath sounds: No wheezing, rhonchi or rales.  Abdominal:     Palpations: Abdomen is soft.     Tenderness: There is no abdominal tenderness. There is no guarding or rebound.  Musculoskeletal:     Cervical back: Normal range of motion and neck supple.     Right lower leg: No edema.  Left lower leg: No edema.  Skin:    General: Skin is warm and dry.     Findings: No rash.     Comments: Patient's skin is  dry and flaking however in the area where she shows me when asked about the rash, mainly her left buttock and rectal area, I do not see any signs of cellulitis or abscess.  Neurological:     General: No focal deficit present.     Mental Status: She is alert. Mental status is at baseline.     Motor: No weakness.     Comments: Patient speaks softly and does not give a lot of details, but can answer my questions and does appear to be doing so appropriately.  Psychiatric:        Mood and Affect: Mood normal.    ED Results / Procedures / Treatments   Labs (all labs ordered are listed, but only abnormal results are displayed) Labs Reviewed  CBC WITH DIFFERENTIAL/PLATELET - Abnormal; Notable for the following components:      Result Value   Platelets 88 (*)    Abs Immature Granulocytes 0.15 (*)    All other components within normal limits  COMPREHENSIVE METABOLIC PANEL - Abnormal; Notable for the following components:   Sodium 116 (*)    Chloride 80 (*)    Glucose, Bld 755 (*)    BUN 53 (*)    Creatinine, Ser 4.03 (*)    Total Protein 8.4 (*)    Albumin 3.2 (*)    Total Bilirubin 2.7 (*)    GFR, Estimated 12 (*)    All other components within normal limits  LIPASE, BLOOD - Abnormal; Notable for the following components:   Lipase 68 (*)    All other components within normal limits  CBG MONITORING, ED - Abnormal; Notable for the following components:   Glucose-Capillary >600 (*)    All other components within normal limits  I-STAT VENOUS BLOOD GAS, ED - Abnormal; Notable for the following components:   pO2, Ven 26.0 (*)    Sodium 116 (*)    Calcium, Ion 1.08 (*)    Hemoglobin 15.6 (*)    All other components within normal limits  TROPONIN I (HIGH SENSITIVITY) - Abnormal; Notable for the following components:   Troponin I (High Sensitivity) 34 (*)    All other components within normal limits  TROPONIN I (HIGH SENSITIVITY) - Abnormal; Notable for the following components:    Troponin I (High Sensitivity) 31 (*)    All other components within normal limits  RESP PANEL BY RT-PCR (FLU A&B, COVID) ARPGX2  URINALYSIS, ROUTINE W REFLEX MICROSCOPIC  PROTIME-INR  AMMONIA    ED ECG REPORT   Date: 10/28/2020  Rate: 61  Rhythm: normal sinus rhythm  QRS Axis: normal  Intervals: QT prolonged  ST/T Wave abnormalities: normal  Conduction Disutrbances:none  Narrative Interpretation:   Old EKG Reviewed: unchanged  I have personally reviewed the EKG tracing and agree with the computerized printout as noted.   Radiology DG Chest 1 View  Result Date: 10/28/2020 CLINICAL DATA:  Chest burning. EXAM: CHEST  1 VIEW COMPARISON:  Chest x-ray dated November 11, 2019. FINDINGS: The heart size and mediastinal contours are within normal limits. Both lungs are clear. The visualized skeletal structures are unremarkable. IMPRESSION: No active disease. Electronically Signed   By: Titus Dubin M.D.   On: 10/28/2020 17:17    Procedures Procedures   Medications Ordered in ED Medications  insulin regular, human (  MYXREDLIN) 100 units/ 100 mL infusion (has no administration in time range)  lactated ringers infusion (has no administration in time range)  dextrose 5 % in lactated ringers infusion (has no administration in time range)  dextrose 50 % solution 0-50 mL (has no administration in time range)  sodium chloride 0.9 % bolus 1,000 mL (has no administration in time range)  sodium chloride 0.9 % bolus 1,000 mL (1,000 mLs Intravenous New Bag/Given 10/28/20 2144)    ED Course  I have reviewed the triage vital signs and the nursing notes.  Pertinent labs & imaging results that were available during my care of the patient were reviewed by me and considered in my medical decision making (see chart for details).  Patient seen and examined.  Work-up reviewed.  Following abnormalities discovered thus far:  Hyperglycemia with normal anion gap: We will give IV fluid bolus and start  on glucose stabilizer protocol.  Acute on chronic kidney disease: Recent baseline creatinine appears to be 2-3.  It is 4.03 here.  Likely in part related to volume depletion as patient appears very dry on exam.  Hyponatremia: Corrected sodium between 126-132.  Normal saline ordered.  Troponin is abnormal but flat in the low 30s.  EKG reviewed.  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  Vital signs reviewed and are as follows: BP (!) 136/100   Pulse 61   Resp 16   Ht 5\' 1"  (1.549 m)   Wt 70.4 kg   SpO2 95%   BMI 29.33 kg/m   Additional work-up: Added ammonia although patient does seem to be alert and oriented.  Added INR due to underlying liver disease.  10:12 PM Troponin flat. IV team RN at bedside attempting access.  10:39 PM Access established. Fluids going, insulin to be started soon. RN will reassess for 2nd line.   10:58 PM Spoke with Dr. Alcario Drought who will see patient.   CRITICAL CARE Performed by: Carlisle Cater PA-C Total critical care time: 40 minutes Critical care time was exclusive of separately billable procedures and treating other patients. Critical care was necessary to treat or prevent imminent or life-threatening deterioration. Critical care was time spent personally by me on the following activities: development of treatment plan with patient and/or surrogate as well as nursing, discussions with consultants, evaluation of patient's response to treatment, examination of patient, obtaining history from patient or surrogate, ordering and performing treatments and interventions, ordering and review of laboratory studies, ordering and review of radiographic studies, pulse oximetry and re-evaluation of patient's condition.     MDM Rules/Calculators/A&P                           Admit.   Final Clinical Impression(s) / ED Diagnoses Final diagnoses:  Hyperosmolar hyperglycemic state (HHS) ([place])  Hyponatremia  Acute kidney injury superimposed on chronic kidney  disease [facility])    Rx / DC Orders ED Discharge Orders     None        Carlisle Cater, PA-C 10/28/20 2302    Tegeler, Gwenyth Allegra, MD 10/28/20 [phone]

## 2020-10-28 NOTE — ED Notes (Signed)
Pt called 2x no answer 

## 2020-10-28 NOTE — ED Notes (Signed)
Unable to obtain temp. Pt eatting ice chips

## 2020-10-28 NOTE — ED Triage Notes (Signed)
Pt from [facility] for hyperglycemia. CBG read "HIGH" at facility. Given 14 units of humalog prior to arrival.  CBG 580 with ems BP 150/100 HR80  Pt was recently on prednisone.  Hx of tracheostomy, currently covered with gauze.  Pt a.o

## 2020-10-28 NOTE — ED Notes (Signed)
IV team at bedside to obtain 2nd PIV. 

## 2020-10-28 NOTE — ED Notes (Addendum)
Pt called 4x no answer  

## 2020-10-28 NOTE — ED Notes (Signed)
Provided update to pt's POA, Katherine [phone]. She is requesting a call back when we know more information and the plan of care. No acute changes noted. Will continue to monitor.

## 2020-10-28 NOTE — ED Notes (Signed)
ED Provider at bedside. 

## 2020-10-28 NOTE — ED Provider Notes (Signed)
Emergency Medicine Provider Triage Evaluation Note  Judy Meza , a 60 y.o. female  was evaluated in triage.  Pt complains of nausea, hyperglycemia and heartburn.  Review of Systems  Positive: Nausea, heartburn, hyperglycemia, dysuria Negative: Chest pain, sob, new cough, vomiting, diarrhea,  Physical Exam  BP (!) 161/100 (BP Location: Right Arm)   Pulse 64   Resp 14   Ht 5\' 1"  (1.549 m)   Wt 70.4 kg   SpO2 93%   BMI 29.33 kg/m  Gen:   Awake, no distress   Resp:  Normal effort  MSK:   Moves extremities without difficulty  Other:  Abd soft and nontender  Medical Decision Making  Medically screening exam initiated at 3:14 PM.  Appropriate orders placed.  FUSAE FLORIO was informed that the remainder of the evaluation will be completed by another provider, this initial triage assessment does not replace that evaluation, and the importance of remaining in the ED until their evaluation is complete.     Rodney Booze, PA-C 10/28/20 1514    Daleen Bo, MD 10/28/20 2021

## 2020-10-29 DIAGNOSIS — B182 Chronic viral hepatitis C: Secondary | ICD-10-CM

## 2020-10-29 DIAGNOSIS — E1100 Type 2 diabetes mellitus with hyperosmolarity without nonketotic hyperglycemic-hyperosmolar coma (NKHHC): Secondary | ICD-10-CM

## 2020-10-29 DIAGNOSIS — N179 Acute kidney failure, unspecified: Secondary | ICD-10-CM

## 2020-10-29 DIAGNOSIS — N184 Chronic kidney disease, stage 4 (severe): Secondary | ICD-10-CM

## 2020-10-29 DIAGNOSIS — E871 Hypo-osmolality and hyponatremia: Secondary | ICD-10-CM

## 2020-10-29 DIAGNOSIS — F0390 Unspecified dementia without behavioral disturbance: Secondary | ICD-10-CM

## 2020-10-29 DIAGNOSIS — N189 Chronic kidney disease, unspecified: Secondary | ICD-10-CM

## 2020-10-29 DIAGNOSIS — F1021 Alcohol dependence, in remission: Secondary | ICD-10-CM

## 2020-10-29 DIAGNOSIS — K7031 Alcoholic cirrhosis of liver with ascites: Secondary | ICD-10-CM

## 2020-10-29 LAB — BASIC METABOLIC PANEL
Anion gap: 10 (ref 5–15)
Anion gap: 6 (ref 5–15)
BUN: 48 mg/dL — ABNORMAL HIGH (ref 6–20)
BUN: 51 mg/dL — ABNORMAL HIGH (ref 6–20)
CO2: 22 mmol/L (ref 22–32)
CO2: 23 mmol/L (ref 22–32)
Calcium: 8.3 mg/dL — ABNORMAL LOW (ref 8.9–10.3)
Calcium: 8.9 mg/dL (ref 8.9–10.3)
Chloride: 87 mmol/L — ABNORMAL LOW (ref 98–111)
Chloride: 95 mmol/L — ABNORMAL LOW (ref 98–111)
Creatinine, Ser: 3.25 mg/dL — ABNORMAL HIGH (ref 0.44–1.00)
Creatinine, Ser: 3.5 mg/dL — ABNORMAL HIGH (ref 0.44–1.00)
GFR, Estimated: 15 mL/min — ABNORMAL LOW (ref >60)
GFR, Estimated: 16 mL/min — ABNORMAL LOW (ref >60)
Glucose, Bld: 396 mg/dL — ABNORMAL HIGH (ref 70–99)
Glucose, Bld: 589 mg/dL (ref 70–99)
Potassium: 4.3 mmol/L (ref 3.5–5.1)
Potassium: 4.9 mmol/L (ref 3.5–5.1)
Sodium: 120 mmol/L — ABNORMAL LOW (ref 135–145)
Sodium: 123 mmol/L — ABNORMAL LOW (ref 135–145)

## 2020-10-29 LAB — CBG MONITORING, ED
Glucose-Capillary: 178 mg/dL — ABNORMAL HIGH (ref 70–99)
Glucose-Capillary: 199 mg/dL — ABNORMAL HIGH (ref 70–99)
Glucose-Capillary: 202 mg/dL — ABNORMAL HIGH (ref 70–99)
Glucose-Capillary: 204 mg/dL — ABNORMAL HIGH (ref 70–99)
Glucose-Capillary: 208 mg/dL — ABNORMAL HIGH (ref 70–99)
Glucose-Capillary: 217 mg/dL — ABNORMAL HIGH (ref 70–99)
Glucose-Capillary: 221 mg/dL — ABNORMAL HIGH (ref 70–99)
Glucose-Capillary: 293 mg/dL — ABNORMAL HIGH (ref 70–99)
Glucose-Capillary: 360 mg/dL — ABNORMAL HIGH (ref 70–99)
Glucose-Capillary: 409 mg/dL — ABNORMAL HIGH (ref 70–99)
Glucose-Capillary: 471 mg/dL — ABNORMAL HIGH (ref 70–99)
Glucose-Capillary: >600 mg/dL (ref 70–99)

## 2020-10-29 LAB — GLUCOSE, CAPILLARY: Glucose-Capillary: 255 mg/dL — ABNORMAL HIGH (ref 70–99)

## 2020-10-29 LAB — HIV ANTIBODY (ROUTINE TESTING W REFLEX): HIV Screen 4th Generation wRfx: NONREACTIVE

## 2020-10-29 LAB — OSMOLALITY: Osmolality: 310 mOsm/kg — ABNORMAL HIGH (ref 275–295)

## 2020-10-29 MED ORDER — INSULIN ASPART 100 UNIT/ML IJ SOLN
0.0000 [IU] | Freq: Every day | INTRAMUSCULAR | Status: DC
  Administered 2020-10-29: 3 [IU] via SUBCUTANEOUS
  Administered 2020-10-30 – 2020-10-31 (×2): 2 [IU] via SUBCUTANEOUS

## 2020-10-29 MED ORDER — LORATADINE 10 MG PO TABS
10.0000 mg | ORAL_TABLET | Freq: Every day | ORAL | Status: DC
  Administered 2020-10-29 – 2020-11-01 (×4): 10 mg via ORAL
  Filled 2020-10-29 (×4): qty 1

## 2020-10-29 MED ORDER — POLYVINYL ALCOHOL 1.4 % OP SOLN
1.0000 [drp] | Freq: Four times a day (QID) | OPHTHALMIC | Status: DC
  Administered 2020-10-29 – 2020-11-01 (×14): 1 [drp] via OPHTHALMIC
  Filled 2020-10-29: qty 15

## 2020-10-29 MED ORDER — LACTULOSE 10 GM/15ML PO SOLN
30.0000 g | Freq: Three times a day (TID) | ORAL | Status: DC
  Administered 2020-10-29 – 2020-11-01 (×10): 30 g via ORAL
  Filled 2020-10-29 (×2): qty 45
  Filled 2020-10-29: qty 60
  Filled 2020-10-29 (×7): qty 45

## 2020-10-29 MED ORDER — INSULIN GLARGINE-YFGN 100 UNIT/ML ~~LOC~~ SOLN
10.0000 [IU] | Freq: Every day | SUBCUTANEOUS | Status: DC
  Administered 2020-10-29: 10 [IU] via SUBCUTANEOUS
  Filled 2020-10-29 (×2): qty 0.1

## 2020-10-29 MED ORDER — FAMOTIDINE 20 MG PO TABS
20.0000 mg | ORAL_TABLET | Freq: Every day | ORAL | Status: DC
  Administered 2020-10-29 – 2020-11-01 (×4): 20 mg via ORAL
  Filled 2020-10-29 (×4): qty 1

## 2020-10-29 MED ORDER — DIPHENHYDRAMINE-ZINC ACETATE 2-0.1 % EX CREA: 1.0000 "application " | TOPICAL_CREAM | CUTANEOUS | Status: DC | PRN

## 2020-10-29 MED ORDER — THIAMINE HCL 100 MG PO TABS
100.0000 mg | ORAL_TABLET | Freq: Every day | ORAL | Status: DC
  Administered 2020-10-29 – 2020-11-01 (×4): 100 mg via ORAL
  Filled 2020-10-29 (×4): qty 1

## 2020-10-29 MED ORDER — ONDANSETRON 4 MG PO TBDP: 8.0000 mg | ORAL_TABLET | Freq: Three times a day (TID) | ORAL | Status: DC | PRN

## 2020-10-29 MED ORDER — GABAPENTIN 100 MG PO CAPS
100.0000 mg | ORAL_CAPSULE | Freq: Three times a day (TID) | ORAL | Status: DC
  Administered 2020-10-29 – 2020-11-01 (×11): 100 mg via ORAL
  Filled 2020-10-29 (×11): qty 1

## 2020-10-29 MED ORDER — FOLIC ACID 1 MG PO TABS
1.0000 mg | ORAL_TABLET | Freq: Every day | ORAL | Status: DC
  Administered 2020-10-29 – 2020-11-01 (×4): 1 mg via ORAL
  Filled 2020-10-29 (×4): qty 1

## 2020-10-29 MED ORDER — LACTATED RINGERS IV SOLN: INTRAVENOUS | Status: DC

## 2020-10-29 MED ORDER — ALBUTEROL SULFATE (2.5 MG/3ML) 0.083% IN NEBU: 2.5000 mg | INHALATION_SOLUTION | Freq: Four times a day (QID) | RESPIRATORY_TRACT | Status: DC | PRN

## 2020-10-29 MED ORDER — PAROXETINE HCL 20 MG PO TABS
40.0000 mg | ORAL_TABLET | Freq: Every day | ORAL | Status: DC
  Administered 2020-10-29: 40 mg via ORAL
  Filled 2020-10-29: qty 2

## 2020-10-29 MED ORDER — NADOLOL 40 MG PO TABS
40.0000 mg | ORAL_TABLET | Freq: Every day | ORAL | Status: DC
  Administered 2020-10-30: 40 mg via ORAL
  Filled 2020-10-29 (×3): qty 1

## 2020-10-29 MED ORDER — KETOTIFEN FUMARATE 0.025 % OP SOLN
1.0000 [drp] | Freq: Every day | OPHTHALMIC | Status: DC
  Administered 2020-10-29 – 2020-11-01 (×4): 1 [drp] via OPHTHALMIC
  Filled 2020-10-29: qty 5

## 2020-10-29 MED ORDER — HYDROCORTISONE 1 % EX CREA
1.0000 "application " | TOPICAL_CREAM | Freq: Two times a day (BID) | CUTANEOUS | Status: DC
  Administered 2020-10-29 – 2020-11-01 (×5): 1 via TOPICAL
  Filled 2020-10-29: qty 28

## 2020-10-29 MED ORDER — RIFAXIMIN 550 MG PO TABS
550.0000 mg | ORAL_TABLET | Freq: Two times a day (BID) | ORAL | Status: DC
  Administered 2020-10-29 – 2020-11-01 (×7): 550 mg via ORAL
  Filled 2020-10-29 (×8): qty 1

## 2020-10-29 MED ORDER — HYDROXYZINE HCL 25 MG PO TABS: 25.0000 mg | ORAL_TABLET | Freq: Four times a day (QID) | ORAL | Status: DC | PRN

## 2020-10-29 MED ORDER — SODIUM BICARBONATE 650 MG PO TABS: 650.0000 mg | ORAL_TABLET | Freq: Two times a day (BID) | ORAL | Status: DC

## 2020-10-29 MED ORDER — INSULIN ASPART 100 UNIT/ML IJ SOLN
0.0000 [IU] | Freq: Three times a day (TID) | INTRAMUSCULAR | Status: DC
  Administered 2020-10-29 (×3): 5 [IU] via SUBCUTANEOUS
  Administered 2020-10-30: 8 [IU] via SUBCUTANEOUS
  Administered 2020-10-30: 5 [IU] via SUBCUTANEOUS
  Administered 2020-10-30: 8 [IU] via SUBCUTANEOUS
  Administered 2020-10-31: 3 [IU] via SUBCUTANEOUS
  Administered 2020-10-31: 5 [IU] via SUBCUTANEOUS
  Administered 2020-10-31: 8 [IU] via SUBCUTANEOUS
  Administered 2020-11-01: 3 [IU] via SUBCUTANEOUS
  Administered 2020-11-01: 8 [IU] via SUBCUTANEOUS

## 2020-10-29 NOTE — ED Notes (Signed)
Pt removed the BP cuff to eat her food.

## 2020-10-29 NOTE — ED Notes (Signed)
RN messaged floor nurse to see if any additional information is needed.

## 2020-10-29 NOTE — Progress Notes (Signed)
Inpatient Diabetes Program Recommendations  AACE/ADA: New Consensus Statement on Inpatient Glycemic Control (2015)  Target Ranges:  Prepandial:   less than 140 mg/dL      Peak postprandial:   less than 180 mg/dL (1-2 hours)      Critically ill patients:  140 - 180 mg/dL   Lab Results  Component Value Date   GLUCAP 178 (H) 10/29/2020   HGBA1C 7.2 (H) 09/17/2020    Review of Glycemic Control Results for Judy Meza, Judy Meza (MRN [id number]) as of 10/29/2020 09:27  Ref. Range 10/29/2020 03:28 10/29/2020 04:40 10/29/2020 05:32 10/29/2020 06:36 10/29/2020 07:59  Glucose-Capillary Latest Ref Range: 70 - 99 mg/dL 293 (H) 217 (H) 202 (H) 199 (H) 178 (H)   Diabetes history: DM 2 Outpatient Diabetes medications: Semglee 1-5 units q HS, Januvia 25 mg daily, Humalog SSI Current orders for Inpatient glycemic control:  Novolog moderate tid with meals and HS Semglee 10 units daily  Inpatient Diabetes Program Recommendations:    Patient transitioning off insulin drip.  Patient's A1C last month was well controlled.  It appears she was on a Steroid taper which likely increased blood sugars and caused hyperglycemia.  Agree with current orders.  Will follow.    Thanks,  Adah Perl, RN, BC-ADM Inpatient Diabetes Coordinator Pager [phone]  (8a-5p)

## 2020-10-29 NOTE — ED Notes (Signed)
Cleaned mucous from around tracheostomy site and replaced guaze per pt request.

## 2020-10-29 NOTE — Progress Notes (Signed)
PROGRESS NOTE                                                                                                                                                                                                             Patient Demographics:    Judy Meza, is a 60 y.o. female, DOB - 1960/11/19, GPQ:[id number]  Outpatient Primary MD for the patient is Caprice Renshaw, MD    LOS - 1  Admit date - 10/28/2020    Chief Complaint  Patient presents with   Hyperglycemia       Brief Narrative (HPI from H&P)  Judy Meza is a 60 y.o. female with medical history significant of COPD, laryngeal CA s/p radiation and tracheostomy, complicated by tracheocutaneous fistula; HCV/EtOH cirrhosis, CKD 4, pancytopenia, NPH, OSA, she was recently with new diagnosis of DM2 as of 09/17/20.  She presented to the ER with elevated blood sugars and not feeling good she was found to be in nonketotic hyperosmolar state, dehydrated, hyponatremic with AKI and admitted to the hospital.   Subjective:    [facility] today has, No headache, No chest pain, No abdominal pain - No Nausea, No new weakness tingling or numbness, no SOB.   Assessment  & Plan :     Nonketotic hyperosmolar state in a patient who was recently diagnosed with DM type II and was recently taking some steroids - she has been placed on IV fluids and IV insulin with good results, bicarb, anion gap and sugars are much improved, has been placed on Lantus sliding scale, will discontinue insulin drip.  Diabetic and insulin education.  2.  Dehydration, pseudohyponatremia, AKI on CKD 4 - plan creatinine around 2.2, hydrated with IV fluids, sodium was lower than what he would expect and nonketotic hyperosmolar state and has come up nicely, she appears euvolemic right now on exam.  Have discussed the case with nephrologist Dr. Jonnie Finner, hold further IV fluids repeat electrolytes in the morning.  Once  sodium has stabilized around these levels can be hydrated gently again tomorrow, renal function has improved avoid nephrotoxins and monitor.   3.  Alcoholic liver cirrhosis.  Stable denies any ongoing alcohol abuse.  Supportive care.  Continue nadolol for portal hypertension.  4.  History of laryngeal cancer with tracheocutaneous fistula.  Outpatient follow-up currently stable.  Tracheocutaneous fistula under bandage.  5.  Recent skin rash requiring steroid use.  Now off of systemic steroids.  Supportive care outpatient dermatology follow-up.  6. Chr. Thrombocytopenia likely due to cirrhosis.  Monitor no bleeding.  7.  Hypertension.  Continue nadolol.   Lab Results  Component Value Date   HGBA1C 7.2 (H) 09/17/2020   CBG (last 3)  Recent Labs    10/29/20 0636 10/29/20 0759 10/29/20 1010  GLUCAP 199* 178* 204*          Condition - Extremely Guarded  Family Communication  : None present  Code Status :  Full  Consults  :  None  PUD Prophylaxis : Pepcid   Procedures  :            Disposition Plan  :    Status is: Inpatient  Remains inpatient appropriate because: NKH, duration, hyponatremia, AKI.  DVT Prophylaxis  :    Place and maintain sequential compression device Start: 10/28/20 2307  Lab Results  Component Value Date   PLT 88 (L) 10/28/2020    Diet :  Diet Order             Diet heart healthy/carb modified Room service appropriate? Yes; Fluid consistency: Thin  Diet effective now                    Inpatient Medications  Scheduled Meds:  Carboxymethylcellul-Glycerin  1 drop Both Eyes QID   famotidine  20 mg Oral Daily   folic acid  1 mg Oral Daily   gabapentin  100 mg Oral TID   hydrocortisone  1 application Topical See admin instructions   insulin aspart  0-15 Units Subcutaneous TID WC   insulin aspart  0-5 Units Subcutaneous QHS   insulin glargine-yfgn  10 Units Subcutaneous Daily   ketotifen  1 drop Both Eyes Daily   lactulose   30 g Oral TID   loratadine  10 mg Oral Daily   nadolol  40 mg Oral Daily   rifaximin  550 mg Oral BID   thiamine  100 mg Oral Daily   Continuous Infusions:  insulin Stopped (10/29/20 1042)   PRN Meds:.albuterol, dextrose, ondansetron  Antibiotics  :    Anti-infectives (From admission, onward)    Start     Dose/Rate Route Frequency Ordered Stop   10/29/20 1000  rifaximin (XIFAXAN) tablet 550 mg        550 mg Oral 2 times daily 10/29/20 0214          Time Spent in minutes  30   Lala Lund M.D on 10/29/2020 at 11:11 AM  To page go to [email or web address]   [facility] -  Office  [phone]  See all Orders from today for further details    Objective:   Vitals:   10/29/20 0830 10/29/20 0900 10/29/20 0930 10/29/20 1011  BP: 107/72 92/71 107/77 118/80  Pulse:    62  Resp: 16 13 17 16   Temp:      TempSrc:      SpO2:    95%  Weight:      Height:        Wt Readings from Last 3 Encounters:  10/28/20 70.4 kg  09/27/20 70.4 kg  09/17/20 70.4 kg     Intake/Output Summary (Last 24 hours) at 10/29/2020 1111 Last data filed at 10/29/2020 7681 Gross per 24 hour  Intake 2345.66 ml  Output --  Net 2345.66 ml     Physical Exam  Awake Alert, No new  F.N deficits, Normal affect Lucas.AT,PERRAL Supple Neck, No JVD,   Symmetrical Chest wall movement, Good air movement bilaterally, CTAB RRR,No Gallops,Rubs or new Murmurs,  +ve B.Sounds, Abd Soft, No tenderness,   No Cyanosis, Clubbing or edema,       Data Review:    CBC Recent Labs  Lab 10/28/20 1517 10/28/20 1537  WBC 9.8  --   HGB 13.9 15.6*  HCT 40.3 46.0  PLT 88*  --   MCV 92.4  --   MCH 31.9  --   MCHC 34.5  --   RDW 14.5  --   LYMPHSABS 0.9  --   MONOABS 1.0  --   EOSABS 0.0  --   BASOSABS 0.0  --     Recent Labs  Lab 10/28/20 1517 10/28/20 1537 10/28/20 2138 10/28/20 2335 10/29/20 0218  NA 116* 116*  --  120* 123*  K 4.6 4.5  --  4.9 4.3  CL 80*  --   --  87* 95*  CO2 24  --    --  23 22  GLUCOSE 755*  --   --  589* 396*  BUN 53*  --   --  51* 48*  CREATININE 4.03*  --   --  3.50* 3.25*  CALCIUM 9.8  --   --  8.9 8.3*  AST 30  --   --   --   --   ALT 28  --   --   --   --   ALKPHOS 98  --   --   --   --   BILITOT 2.7*  --   --   --   --   ALBUMIN 3.2*  --   --   --   --   INR  --   --  1.3*  --   --   AMMONIA  --   --  44*  --   --     ------------------------------------------------------------------------------------------------------------------ No results for input(s): CHOL, HDL, LDLCALC, TRIG, CHOLHDL, LDLDIRECT in the last 72 hours.  Lab Results  Component Value Date   HGBA1C 7.2 (H) 09/17/2020   ------------------------------------------------------------------------------------------------------------------ No results for input(s): TSH, T4TOTAL, T3FREE, THYROIDAB in the last 72 hours.  Invalid input(s): FREET3  Cardiac Enzymes No results for input(s): CKMB, TROPONINI, MYOGLOBIN in the last 168 hours.  Invalid input(s): CK ------------------------------------------------------------------------------------------------------------------    Component Value Date/Time   BNP 67.1 10/03/2019 1459      Radiology Reports DG Chest 1 View  Result Date: 10/28/2020 CLINICAL DATA:  Chest burning. EXAM: CHEST  1 VIEW COMPARISON:  Chest x-ray dated November 11, 2019. FINDINGS: The heart size and mediastinal contours are within normal limits. Both lungs are clear. The visualized skeletal structures are unremarkable. IMPRESSION: No active disease. Electronically Signed   By: Titus Dubin M.D.   On: 10/28/2020 17:17

## 2020-10-29 NOTE — H&P (Signed)
History and Physical    Judy Meza DDU:[id number] DOB: [birth year] DOA: 10/28/2020  PCP: Caprice Renshaw, MD  Patient coming from: [place]  I have personally briefly reviewed patient's old medical records in [place]  Chief Complaint: Hyperglycemia  HPI: Judy Meza is a 60 y.o. female with medical history significant of COPD, laryngeal CA s/p radiation and tracheostomy, complicated by tracheocutaneous fistula; HCV/EtOH cirrhosis, CKD 4, pancytopenia, NPH, OSA.  Pt recently with new diagnosis of DM2 as of 09/17/20.  Pt with procedure in Sept 2022 to try and close tracheocutaneous fistula, but had to be canceled due to high BGLs.  Pt presents to ED today for elevated BGL, nausea, "feeling bad".  Pt on prednisone for rash in rectal area over past couple of weeks.  Not clear if still on this med, not clear of dosing.  Today, pt found to have very high BGLs and EMS called.  Pt brought to ED.  BGL reading high so pt given 14u humalog prior to arrival.  Patient has vague complaints of not feeling well.  She states that she vomited 1 time today.  No significant abdominal pain.  No fevers or URI symptoms reported.  She does endorse being very thirsty and urinating a lot.  Symptoms constant, severe, persistent.   ED Course: Despite the 14u PTA, BGL is still 755 on initial lab work.  Creat 4.0 up from 2.99 last month.  Sodium 116 but corrected sodium (for BGL) is 132  Given 2L NS bolus, started on insulin gtt.   Review of Systems: As per HPI, otherwise all review of systems negative.  Past Medical History:  Diagnosis Date   Acute kidney failure (HCC)    Chronic hepatitis C ([place])    Chronic kidney disease, stage 3 (HCC)    Cirrhosis ([place])    COPD (chronic obstructive pulmonary disease) ([place])    COVID-19 virus infection    Diabetes mellitus without complication (HCC)    C3J 7.2% 09/17/20   GERD (gastroesophageal reflux disease)    History of laryngeal cancer  07/28/2019   I   Hypertension    Liver disease    Normal pressure hydrocephalus (HCC)    Renal disorder    Sleep apnea    uses O2 at night   Thrombocytopenia ([place])    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    Past Surgical History:  Procedure Laterality Date   ESOPHAGEAL BANDING  10/04/2019   Procedure: ESOPHAGEAL BANDING;  Surgeon: Lavena Bullion, DO;  Location: [facility];  Service: Gastroenterology;;   ESOPHAGOGASTRODUODENOSCOPY (EGD) WITH PROPOFOL N/A 10/04/2019   Procedure: ESOPHAGOGASTRODUODENOSCOPY (EGD) WITH PROPOFOL;  Surgeon: Lavena Bullion, DO;  Location: [facility];  Service: Gastroenterology;  Laterality: N/A;   HOT HEMOSTASIS N/A 10/04/2019   Procedure: HOT HEMOSTASIS (ARGON PLASMA COAGULATION/BICAP);  Surgeon: Lavena Bullion, DO;  Location: [facility] ENDOSCOPY;  Service: Gastroenterology;  Laterality: N/A;   PORT-A-CATH REMOVAL     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/2020   Procedure: REPAIR OF TRACHEOCUTANEOIS FISTULA;  Surgeon: Izora Gala, MD;  Location: [facility];  Service: ENT;  Laterality: N/A;     reports that she has been smoking cigarettes. She has been smoking an average of .25 packs per day. She has never used smokeless tobacco. She reports that she does not currently use alcohol. She reports that she does not currently use drugs.  No Known Allergies  Family History  Problem Relation Age of Onset  Healthy Mother    Hypertension Other      Prior to Admission medications   Medication Sig Start Date End Date Taking? Authorizing Provider  acetaminophen (TYLENOL) 325 MG tablet Take 650 mg by mouth every 4 (four) hours as needed for mild pain.    [provider]  albuterol (VENTOLIN HFA) 108 (90 Base) MCG/ACT inhaler Inhale 2 puffs into the lungs every 6 (six) hours as needed for shortness of breath.    [provider]  diphenhydrAMINE-zinc acetate (BENADRYL ITCH STOPPING) cream Apply 1 application  topically every 4 (four) hours as needed for itching.    [provider]  ergocalciferol (VITAMIN D2) 1.25 MG (50000 UT) capsule Take 50,000 Units by mouth every Friday. Fridays    [provider]  famotidine (PEPCID) 20 MG tablet Take 20 mg by mouth daily.    [provider]  folic acid (FOLVITE) 1 MG tablet Take 1 tablet (1 mg total) by mouth daily. 10/09/19   Andrew Au, MD  furosemide (LASIX) 40 MG tablet Take 40 mg by mouth every Monday, Wednesday, and Friday.    [provider]  gabapentin (NEURONTIN) 100 MG capsule Take 100 mg by mouth 3 (three) times daily.    [provider]  HYDROcodone-acetaminophen (NORCO) 7.5-325 MG tablet Take 1 tablet by mouth every 6 (six) hours as needed for moderate pain. 09/28/20   Izora Gala, MD  hydrocortisone 2.5 % cream Apply 1 application topically every 8 (eight) hours. Apply to back of neck every shift.    [provider]  hydrOXYzine (ATARAX/VISTARIL) 25 MG tablet Take 25 mg by mouth every 6 (six) hours as needed for itching.    [provider]  lactulose (CHRONULAC) 10 GM/15ML solution Take 50 g by mouth in the morning, at noon, in the evening, and at bedtime.    [provider]  loratadine (CLARITIN) 10 MG tablet Take 10 mg by mouth daily.    [provider]  nadolol (CORGARD) 40 MG tablet Take 1 tablet (40 mg total) by mouth daily. 01/22/20   Cirigliano, Vito V, DO  Olopatadine HCl 0.2 % SOLN Place 1 drop into both eyes daily as needed (allergic conjunctivitis).    [provider]  ondansetron (ZOFRAN ODT) 8 MG disintegrating tablet Take 1 tablet (8 mg total) by mouth every 8 (eight) hours as needed for nausea or vomiting. 09/28/20   Izora Gala, MD  PARoxetine (PAXIL) 40 MG tablet Take 40 mg by mouth daily.    [provider]  rifaximin (XIFAXAN) 550 MG TABS tablet Take 1 tablet (550 mg total) by mouth 2 (two) times daily. 01/22/20   Cirigliano, Vito V,  DO  Skin Protectants, Misc. (EUCERIN) cream Apply 1 application topically every 12 (twelve) hours as needed for dry skin.    [provider]  sodium bicarbonate 650 MG tablet Take 1 tablet (650 mg total) by mouth 2 (two) times daily. 08/09/19   Hosie Poisson, MD  spironolactone (ALDACTONE) 100 MG tablet Take 1 tablet (100 mg total) by mouth every Monday, Wednesday, and Friday. 10/30/19   Florencia Reasons, MD  thiamine 100 MG tablet Take 1 tablet (100 mg total) by mouth daily. 06/08/19   Mendel Corning, MD    Physical Exam: Vitals:   10/28/20 2150 10/28/20 2300 10/28/20 2330 10/29/20 0000  BP: (!) 123/98 107/83 119/74 103/63  Pulse: 60 (!) 59 (!) 58 60  Resp: 16 15 18 15   Temp: (!) 96.5 F (35.8  C)     TempSrc: Rectal     SpO2: 98% 98% 95% 97%  Weight:      Height:        Constitutional: NAD, calm, comfortable Eyes: PERRL, lids and conjunctivae normal ENMT: Mucous membranes are Dry. Posterior pharynx clear of any exudate or lesions.Normal dentition.  Neck: Tracheostomy / fistula covered with bandage in place. Respiratory: clear to auscultation bilaterally, no wheezing, no crackles. Normal respiratory effort. No accessory muscle use.  Cardiovascular: Regular rate and rhythm, no murmurs / rubs / gallops. No extremity edema. 2+ pedal pulses. No carotid bruits.  Abdomen: no tenderness, no masses palpated. No hepatosplenomegaly. Bowel sounds positive.  Musculoskeletal: no clubbing / cyanosis. No joint deformity upper and lower extremities. Good ROM, no contractures. Normal muscle tone.  Skin: No obvious cellulitis or abscess in area of supposed rash. Neurologic: CN 2-12 grossly intact. Sensation intact, DTR normal. Strength 5/5 in all 4.  Psychiatric: Alert and oriented x 3. Normal mood. Answers questions, doesn't give a lot of details though.   Labs on Admission: I have personally reviewed following labs and imaging studies  CBC: Recent Labs  Lab 10/28/20 1517 10/28/20 1537  WBC  9.8  --   NEUTROABS 7.7  --   HGB 13.9 15.6*  HCT 40.3 46.0  MCV 92.4  --   PLT 88*  --    Basic Metabolic Panel: Recent Labs  Lab 10/28/20 1517 10/28/20 1537 10/28/20 2335  NA 116* 116* 120*  K 4.6 4.5 4.9  CL 80*  --  87*  CO2 24  --  23  GLUCOSE 755*  --  589*  BUN 53*  --  51*  CREATININE 4.03*  --  3.50*  CALCIUM 9.8  --  8.9   GFR: Estimated Creatinine Clearance: 15.7 mL/min (A) (by C-G formula based on SCr of 3.5 mg/dL (H)). Liver Function Tests: Recent Labs  Lab 10/28/20 1517  AST 30  ALT 28  ALKPHOS 98  BILITOT 2.7*  PROT 8.4*  ALBUMIN 3.2*   Recent Labs  Lab 10/28/20 1517  LIPASE 68*   Recent Labs  Lab 10/28/20 2138  AMMONIA 44*   Coagulation Profile: Recent Labs  Lab 10/28/20 2138  INR 1.3*   Cardiac Enzymes: No results for input(s): CKTOTAL, CKMB, CKMBINDEX, TROPONINI in the last 168 hours. BNP (last 3 results) No results for input(s): PROBNP in the last 8760 hours. HbA1C: No results for input(s): HGBA1C in the last 72 hours. CBG: Recent Labs  Lab 10/28/20 1505 10/28/20 2329 10/29/20 0017  GLUCAP >600* >600* >600*   Lipid Profile: No results for input(s): CHOL, HDL, LDLCALC, TRIG, CHOLHDL, LDLDIRECT in the last 72 hours. Thyroid Function Tests: No results for input(s): TSH, T4TOTAL, FREET4, T3FREE, THYROIDAB in the last 72 hours. Anemia Panel: No results for input(s): VITAMINB12, FOLATE, FERRITIN, TIBC, IRON, RETICCTPCT in the last 72 hours. Urine analysis:    Component Value Date/Time   COLORURINE YELLOW 10/28/2020 2201   APPEARANCEUR HAZY (A) 10/28/2020 2201   LABSPEC 1.015 10/28/2020 2201   PHURINE 6.0 10/28/2020 2201   GLUCOSEU >=500 (A) 10/28/2020 2201   HGBUR NEGATIVE 10/28/2020 2201   BILIRUBINUR NEGATIVE 10/28/2020 2201   [place] 10/28/2020 2201   PROTEINUR NEGATIVE 10/28/2020 2201   NITRITE NEGATIVE 10/28/2020 2201   LEUKOCYTESUR TRACE (A) 10/28/2020 2201    Radiological Exams on Admission: DG  Chest 1 View  Result Date: 10/28/2020 CLINICAL DATA:  Chest burning. EXAM: CHEST  1 VIEW COMPARISON:  Chest  x-ray dated November 11, 2019. FINDINGS: The heart size and mediastinal contours are within normal limits. Both lungs are clear. The visualized skeletal structures are unremarkable. IMPRESSION: No active disease. Electronically Signed   By: Titus Dubin M.D.   On: 10/28/2020 17:17    EKG: Independently reviewed.  Assessment/Plan Principal Problem:   Hyperosmolar hyperglycemic state (HHS) ([place]) Active Problems:   CKD (chronic kidney disease) stage 4, GFR 15-29 ml/min (HCC)   Alcoholism in remission ([place])   AKI (acute kidney injury) ([place])   Alcoholic cirrhosis of liver with ascites (HCC)   Dementia without behavioral disturbance (HCC)   Chronic hepatitis C (HCC)    HHS - HHS pathway BMP Q4H IVF per pathway Insulin gtt Hold home DM meds Cirrhosis - Med rec pending Plan to continue chronic cirrhosis meds [place]mon[place] 44 AKI on CKD 4 - Due to dehydration associated with #1 IVF as above Trend creatinine Monitor UOP Dementia - Cont home meds when med rec completed ? Recent steroid use - Looks like rash resolved at this point Not clear how much steroid she has been on, or if even still on steroids at SNF at this point Pharm is trying to get in touch with SNF to complete med rec Until then, will hold off on ordering further steroids for the moment Though keep eye on patient for development of adrenal insufficiency / crisis symptoms (and obviously order steroids if she develops these).  DVT prophylaxis: SCDs - chronic thrombocytopenia Code Status: Full Family Communication: No family in room Disposition Plan: Back to facility after HHS resolved and AKI improved Consults called: None Admission status: Admit to inpatient  Severity of Illness: The appropriate patient status for this patient is INPATIENT. Inpatient status is judged to be reasonable and necessary in order to  provide the required intensity of service to ensure the patient's safety. The patient's presenting symptoms, physical exam findings, and initial radiographic and laboratory data in the context of their chronic comorbidities is felt to place them at high risk for further clinical deterioration. Furthermore, it is not anticipated that the patient will be medically stable for discharge from the hospital within 2 midnights of admission.   IP status for treatment of HHS, need for BMP Q4H with insulin gtt.  * I certify that at the point of admission it is my clinical judgment that the patient will require inpatient hospital care spanning beyond 2 midnights from the point of admission due to high intensity of service, high risk for further deterioration and high frequency of surveillance required.*   Cheryll Keisler M. DO [facility]  How to contact the [facility] Attending or Consulting provider [place] or covering provider during after hours [place], for this patient?  Check the care team in [facility] and look for a) attending/consulting TR[facility] provider listed and b) the [facility] team listed Log into [email or web address]  Amion Physician Scheduling and messaging for groups and whole hospitals  On call and physician scheduling software for group practices, residents, hospitalists and other medical providers for call, clinic, rotation and shift schedules. On[employer] is a hospital-wide system for scheduling doctors and paging doctors on call. EasyPlot is for scientific plotting and data analysis.  [email or web address]  and use [place]'s universal password to access. If you do not have the password, please contact the hospital operator.  Locate the [facility] provider you are looking for under [facility] and page to a number that you can be directly reached. If you still have difficulty reaching  the provider, please page the [facility] (Director on Call) for the Hospitalists listed on amion for assistance.  10/29/2020, 12:41 AM

## 2020-10-29 NOTE — ED Notes (Signed)
Pt assisted up to [facility] and back to bed. IV medications infusing without difficulty. No acute changes noted. Will continue to monitor.

## 2020-10-29 NOTE — Progress Notes (Addendum)
Med rec completed: just finished prednisone course yesterday.  Hold diuretics, continue lactulose and rifaxan.

## 2020-10-29 NOTE — Progress Notes (Signed)
PT Cancellation Note  Patient Details Name: Judy Meza MRN: [id number] DOB: 02-01-1960   Cancelled Treatment:    Reason Eval/Treat Not Completed: Other (comment) Pt reports too tired this afternoon.  Will f/u at later day. Abran Richard, PT Acute Rehab Services Pager [phone] [facility] [place] 10/29/2020, 4:56 PM

## 2020-10-30 ENCOUNTER — Inpatient Hospital Stay (HOSPITAL_COMMUNITY): Payer: Medicaid Other

## 2020-10-30 DIAGNOSIS — E1100 Type 2 diabetes mellitus with hyperosmolarity without nonketotic hyperglycemic-hyperosmolar coma (NKHHC): Secondary | ICD-10-CM | POA: Diagnosis not present

## 2020-10-30 LAB — CBC WITH DIFFERENTIAL/PLATELET
Abs Immature Granulocytes: 0.07 10*3/uL (ref 0.00–0.07)
Basophils Absolute: 0 10*3/uL (ref 0.0–0.1)
Basophils Relative: 0 %
Eosinophils Absolute: 0 10*3/uL (ref 0.0–0.5)
Eosinophils Relative: 1 %
HCT: 32.3 % — ABNORMAL LOW (ref 36.0–46.0)
Hemoglobin: 11.2 g/dL — ABNORMAL LOW (ref 12.0–15.0)
Immature Granulocytes: 2 %
Lymphocytes Relative: 17 %
Lymphs Abs: 0.8 10*3/uL (ref 0.7–4.0)
MCH: 32.4 pg (ref 26.0–34.0)
MCHC: 34.7 g/dL (ref 30.0–36.0)
MCV: 93.4 fL (ref 80.0–100.0)
Monocytes Absolute: 0.5 10*3/uL (ref 0.1–1.0)
Monocytes Relative: 11 %
Neutro Abs: 3.2 10*3/uL (ref 1.7–7.7)
Neutrophils Relative %: 69 %
Platelets: 42 10*3/uL — ABNORMAL LOW (ref 150–400)
RBC: 3.46 MIL/uL — ABNORMAL LOW (ref 3.87–5.11)
RDW: 15.2 % (ref 11.5–15.5)
WBC: 4.6 10*3/uL (ref 4.0–10.5)
nRBC: 0.4 % — ABNORMAL HIGH (ref 0.0–0.2)

## 2020-10-30 LAB — COMPREHENSIVE METABOLIC PANEL
ALT: 36 U/L (ref 0–44)
AST: 66 U/L — ABNORMAL HIGH (ref 15–41)
Albumin: 2.3 g/dL — ABNORMAL LOW (ref 3.5–5.0)
Alkaline Phosphatase: 87 U/L (ref 38–126)
Anion gap: 9 (ref 5–15)
BUN: 41 mg/dL — ABNORMAL HIGH (ref 6–20)
CO2: 18 mmol/L — ABNORMAL LOW (ref 22–32)
Calcium: 8.5 mg/dL — ABNORMAL LOW (ref 8.9–10.3)
Chloride: 95 mmol/L — ABNORMAL LOW (ref 98–111)
Creatinine, Ser: 3.15 mg/dL — ABNORMAL HIGH (ref 0.44–1.00)
GFR, Estimated: 16 mL/min — ABNORMAL LOW (ref >60)
Glucose, Bld: 317 mg/dL — ABNORMAL HIGH (ref 70–99)
Potassium: 4 mmol/L (ref 3.5–5.1)
Sodium: 122 mmol/L — ABNORMAL LOW (ref 135–145)
Total Bilirubin: 1.8 mg/dL — ABNORMAL HIGH (ref 0.3–1.2)
Total Protein: 6 g/dL — ABNORMAL LOW (ref 6.5–8.1)

## 2020-10-30 LAB — GLUCOSE, CAPILLARY
Glucose-Capillary: 205 mg/dL — ABNORMAL HIGH (ref 70–99)
Glucose-Capillary: 227 mg/dL — ABNORMAL HIGH (ref 70–99)
Glucose-Capillary: 285 mg/dL — ABNORMAL HIGH (ref 70–99)
Glucose-Capillary: 298 mg/dL — ABNORMAL HIGH (ref 70–99)
Glucose-Capillary: 300 mg/dL — ABNORMAL HIGH (ref 70–99)

## 2020-10-30 LAB — MAGNESIUM: Magnesium: 1.9 mg/dL (ref 1.7–2.4)

## 2020-10-30 LAB — HEMOGLOBIN A1C
Hgb A1c MFr Bld: 12.6 % — ABNORMAL HIGH (ref 4.8–5.6)
Mean Plasma Glucose: 314.92 mg/dL

## 2020-10-30 MED ORDER — SODIUM CHLORIDE 0.9 % IV SOLN: INTRAVENOUS | Status: DC

## 2020-10-30 MED ORDER — INSULIN GLARGINE-YFGN 100 UNIT/ML ~~LOC~~ SOLN
15.0000 [IU] | Freq: Every day | SUBCUTANEOUS | Status: DC
  Administered 2020-10-30 – 2020-11-01 (×3): 15 [IU] via SUBCUTANEOUS
  Filled 2020-10-30 (×3): qty 0.15

## 2020-10-30 NOTE — Progress Notes (Signed)
Inpatient Diabetes Program Recommendations  AACE/ADA: New Consensus Statement on Inpatient Glycemic Control (2015)  Target Ranges:  Prepandial:   less than 140 mg/dL      Peak postprandial:   less than 180 mg/dL (1-2 hours)      Critically ill patients:  140 - 180 mg/dL   Lab Results  Component Value Date   GLUCAP 285 (H) 10/30/2020   HGBA1C 12.6 (H) 10/30/2020    Review of Glycemic ControlResults for SIMON, AABERG (MRN [id number]) as of 10/30/2020 10:41  Ref. Range 10/29/2020 11:43 10/29/2020 17:06 10/29/2020 22:42 10/30/2020 00:07 10/30/2020 07:51  Glucose-Capillary Latest Ref Range: 70 - 99 mg/dL 208 (H) 221 (H) 255 (H) 300 (H) 285 (H)   Diabetes history: DM 2 Outpatient Diabetes medications: Semglee 1-5 units q HS, Januvia 25 mg daily, Humalog SSI Current orders for Inpatient glycemic control:  Novolog moderate tid with meals and HS Semglee 15 units daily   Inpatient Diabetes Program Recommendations:   Agree with increase in Semglee.  Note A1C has gone from 7.2% to 12.6% in once month.  Possibly due to recent steroid taper? Prednisone completed on 10/17.  Will need to monitor closely.  Looks like she may need larger dose of basal insulin at SNF, however may need to be reduced once steroids no longer in system.    Thanks,  Adah Perl, RN, BC-ADM Inpatient Diabetes Coordinator Pager [phone]  (8a-5p)

## 2020-10-30 NOTE — Progress Notes (Signed)
Progress Note    Judy Meza  DVV:[id number] DOB: 1960/02/02  DOA: 10/28/2020 PCP: Caprice Renshaw, MD    Brief Narrative:     Medical records reviewed and are as summarized below:  Judy Meza is an 60 y.o. female with elevated blood sugars and not feeling good she was found to be in nonketotic hyperosmolar state, dehydrated, hyponatremic with AKI and admitted to the hospital.   Assessment/Plan:   Principal Problem:   Hyperosmolar hyperglycemic state (HHS) ([place]) Active Problems:   CKD (chronic kidney disease) stage 4, GFR 15-29 ml/min (HCC)   Alcoholism in remission ([place])   AKI (acute kidney injury) ([place])   Alcoholic cirrhosis of liver with ascites ([place])   Dementia without behavioral disturbance ([place])   Chronic hepatitis C ([place])   Nonketotic hyperosmolar state in a patient who was recently diagnosed with DM type II and was recently taking some steroids  -SSI -adjust long acting -HgbA1c: 12.6   Dehydration, pseudohyponatremia, AKI on CKD 4  -creatinine around 2.2, hydrated with IV fluids, sodium was lower than what he would expect and nonketotic hyperosmolar state a -Dr. Candiss Norse discussed the case with nephrologist Dr. Jonnie Finner, hold further IV fluids repeat electrolytes in the morning.   -IVF overnight    Alcoholic liver cirrhosis.   -Stable denies any ongoing alcohol abuse.  Supportive care.  Continue nadolol for portal hypertension.   History of laryngeal cancer with tracheocutaneous fistula.   -Outpatient follow-up with ENT -currently stable.   -Tracheocutaneous fistula under bandage.   Recent skin rash requiring steroid use.   -Now off of systemic steroids.   -Supportive care outpatient dermatology follow-up.    Chr. Thrombocytopenia  -likely due to cirrhosis   Hypertension.   -Continue nadolol.        Family Communication/Anticipated D/C date and plan/Code Status   DVT prophylaxis: scd Code Status: Full Code.  Disposition Plan: Status is:  Inpatient  Remains inpatient appropriate because: needs correction of Na         Medical Consultants:   None.   Subjective:   Asking for some juice  Objective:    Vitals:   10/29/20 1800 10/29/20 2100 10/30/20 0800 10/30/20 0938  BP: 112/76 96/64 104/68 113/71  Pulse: 70 72 70   Resp: 20 20 19    Temp:  98.6 F (37 C)    TempSrc:  Oral    SpO2: 93% 95% 91%   Weight:  66.5 kg    Height:  5\' 1"  (1.549 m)      Intake/Output Summary (Last 24 hours) at 10/30/2020 1153 Last data filed at 10/30/2020 6269 Gross per 24 hour  Intake 240 ml  Output --  Net 240 ml   Filed Weights   10/28/20 1512 10/29/20 2100  Weight: 70.4 kg 66.5 kg    Exam:  General: Appearance:     Overweight female in no acute distress     Lungs:     respirations unlabored  Heart:    Normal heart rate.     MS:   All extremities are intact.    Neurologic:   Awake, alert     Data Reviewed:   I have personally reviewed following labs and imaging studies:  Labs: Labs show the following:   Basic Metabolic Panel: Recent Labs  Lab 10/28/20 1517 10/28/20 1537 10/28/20 2335 10/29/20 0218 10/30/20 0327  NA 116* 116* 120* 123* 122*  K 4.6 4.5 4.9 4.3 4.0  CL 80*  --  87* 95*  95*  CO2 24  --  23 22 18*  GLUCOSE 755*  --  589* 396* 317*  BUN 53*  --  51* 48* 41*  CREATININE 4.03*  --  3.50* 3.25* 3.15*  CALCIUM 9.8  --  8.9 8.3* 8.5*  MG  --   --   --   --  1.9   GFR Estimated Creatinine Clearance: 17 mL/min (A) (by C-G formula based on SCr of 3.15 mg/dL (H)). Liver Function Tests: Recent Labs  Lab 10/28/20 1517 10/30/20 0327  AST 30 66*  ALT 28 36  ALKPHOS 98 87  BILITOT 2.7* 1.8*  PROT 8.4* 6.0*  ALBUMIN 3.2* 2.3*   Recent Labs  Lab 10/28/20 1517  LIPASE 68*   Recent Labs  Lab 10/28/20 2138  AMMONIA 44*   Coagulation profile Recent Labs  Lab 10/28/20 2138  INR 1.3*    CBC: Recent Labs  Lab 10/28/20 1517 10/28/20 1537 10/30/20 0327  WBC 9.8  --  4.6   NEUTROABS 7.7  --  3.2  HGB 13.9 15.6* 11.2*  HCT 40.3 46.0 32.3*  MCV 92.4  --  93.4  PLT 88*  --  42*   Cardiac Enzymes: No results for input(s): CKTOTAL, CKMB, CKMBINDEX, TROPONINI in the last 168 hours. BNP (last 3 results) No results for input(s): PROBNP in the last 8760 hours. CBG: Recent Labs  Lab 10/29/20 1143 10/29/20 1706 10/29/20 2242 10/30/20 0007 10/30/20 0751  GLUCAP 208* 221* 255* 300* 285*   D-Dimer: No results for input(s): DDIMER in the last 72 hours. Hgb A1c: Recent Labs    10/30/20 0327  HGBA1C 12.6*   Lipid Profile: No results for input(s): CHOL, HDL, LDLCALC, TRIG, CHOLHDL, LDLDIRECT in the last 72 hours. Thyroid function studies: No results for input(s): TSH, T4TOTAL, T3FREE, THYROIDAB in the last 72 hours.  Invalid input(s): FREET3 Anemia work up: No results for input(s): VITAMINB12, FOLATE, FERRITIN, TIBC, IRON, RETICCTPCT in the last 72 hours. Sepsis Labs: Recent Labs  Lab 10/28/20 1517 10/30/20 0327  WBC 9.8 4.6    Microbiology Recent Results (from the past 240 hour(s))  Resp Panel by RT-PCR (Flu A&B, Covid) Urine, Clean Catch     Status: None   Collection Time: 10/28/20  8:57 PM   Specimen: Urine, Clean Catch; Nasopharyngeal(NP) swabs in vial transport medium  Result Value Ref Range Status   SARS Coronavirus 2 by RT PCR NEGATIVE NEGATIVE Final    Comment: (NOTE) SARS-CoV-2 target nucleic acids are NOT DETECTED.  The SARS-CoV-2 RNA is generally detectable in upper respiratory specimens during the acute phase of infection. The lowest concentration of SARS-CoV-2 viral copies this assay can detect is 138 copies/mL. A negative result does not preclude SARS-Cov-2 infection and should not be used as the sole basis for treatment or other patient management decisions. A negative result may occur with  improper specimen collection/handling, submission of specimen other than nasopharyngeal swab, presence of viral mutation(s) within  the areas targeted by this assay, and inadequate number of viral copies(<138 copies/mL). A negative result must be combined with clinical observations, patient history, and epidemiological information. The expected result is Negative.  Fact Sheet for Patients:  EntrepreneurPulse.com.au  Fact Sheet for Healthcare Providers:  IncredibleEmployment.be  This test is no t yet approved or cleared by the [place] FDA and  has been authorized for detection and/or diagnosis of SARS-CoV-2 by FDA under an Emergency Use Authorization (EUA). This EUA will remain  in effect (meaning this test can be  used) for the duration of the COVID-19 declaration under Section 564(b)(1) of the Act, 21 U.S.C.section 360bbb-3(b)(1), unless the authorization is terminated  or revoked sooner.       Influenza A by PCR NEGATIVE NEGATIVE Final   Influenza B by PCR NEGATIVE NEGATIVE Final    Comment: (NOTE) The Xpert Xpress SARS-CoV-2/FLU/RSV plus assay is intended as an aid in the diagnosis of influenza from Nasopharyngeal swab specimens and should not be used as a sole basis for treatment. Nasal washings and aspirates are unacceptable for Xpert Xpress SARS-CoV-2/FLU/RSV testing.  Fact Sheet for Patients: EntrepreneurPulse.com.au  Fact Sheet for Healthcare Providers: IncredibleEmployment.be  This test is not yet approved or cleared by the [place] FDA and has been authorized for detection and/or diagnosis of SARS-CoV-2 by FDA under an Emergency Use Authorization (EUA). This EUA will remain in effect (meaning this test can be used) for the duration of the COVID-19 declaration under Section 564(b)(1) of the Act, 21 U.S.C. section 360bbb-3(b)(1), unless the authorization is terminated or revoked.  Performed at [facility], [place] [address], [place], [place] [address]     Procedures and diagnostic studies:  DG Chest 1  View  Result Date: 10/28/2020 CLINICAL DATA:  Chest burning. EXAM: CHEST  1 VIEW COMPARISON:  Chest x-ray dated November 11, 2019. FINDINGS: The heart size and mediastinal contours are within normal limits. Both lungs are clear. The visualized skeletal structures are unremarkable. IMPRESSION: No active disease. Electronically Signed   By: Titus Dubin M.D.   On: 10/28/2020 17:17    Medications:    famotidine  20 mg Oral Daily   folic acid  1 mg Oral Daily   gabapentin  100 mg Oral TID   hydrocortisone cream  1 application Topical BID   insulin aspart  0-15 Units Subcutaneous TID WC   insulin aspart  0-5 Units Subcutaneous QHS   insulin glargine-yfgn  15 Units Subcutaneous Daily   ketotifen  1 drop Both Eyes Daily   lactulose  30 g Oral TID   loratadine  10 mg Oral Daily   nadolol  40 mg Oral Daily   polyvinyl alcohol  1 drop Both Eyes QID   rifaximin  550 mg Oral BID   thiamine  100 mg Oral Daily   Continuous Infusions:   LOS: 2 days   Geradine Girt  [facility]   How to contact the [facility] Attending or Consulting provider [place] or covering provider during after hours [place], for this patient?  Check the care team in [facility] and look for a) attending/consulting [facility] provider listed and b) the [facility] team listed Log into [email or web address] and use [place]'s universal password to access. If you do not have the password, please contact the hospital operator. Locate the [facility] provider you are looking for under [facility] and page to a number that you can be directly reached. If you still have difficulty reaching the provider, please page the [facility] (Director on Call) for the Hospitalists listed on amion for assistance.  10/30/2020, 11:53 AM

## 2020-10-30 NOTE — Progress Notes (Signed)
IV team consult placed for Iv access. Assessed pt for IV access and pt declined attempts at this time. Beverlee Nims, RN was notified and asked to pass along the patient's declination for an IV at this time. If the patient is agreeable later Beverlee Nims, RN was told to place another consult.

## 2020-10-30 NOTE — Evaluation (Signed)
Physical Therapy Evaluation Patient Details Name: Judy Meza MRN: [id number] DOB: [birth year] Today's Date: 10/30/2020  History of Present Illness  Pt is 60 yo femaled admitted with hyperglycemia and in nonketotic hyperosmolar state, dehydrated, hyponatremic, with AKI on 10/28/20.  Pt with hx including COPD, laryngeal CA s/p radiation and trach complicated by tracheocutaneous fistula, HCV/EtOH cirrhosis, CKD 4, pancytopenia, NPH, OSA, recently dx DM  Clinical Impression  Pt admitted with above diagnosis. Pt is from SNF long term but reports independent with ADLs and gait.  At evaluation today, pt completing bed mobility easily but when standing she was unable to weight bear on L due to pain.  Reports she twisted her ankle yesterday and did not realize it was bad until we stood.  Ambulated 5'x2 but with little to no weight on L side.  L ankle painful to palpate lateral malleolus and lateral ligaments, painful inversion/eversion, and very painful weight bearing.  Notified MD.   Pt currently with functional limitations due to the deficits listed below (see PT Problem List). Pt will benefit from skilled PT to increase their independence and safety with mobility to allow discharge to the venue listed below.          Recommendations for follow up therapy are one component of a multi-disciplinary discharge planning process, led by the attending physician.  Recommendations may be updated based on patient status, additional functional criteria and insurance authorization.  Follow Up Recommendations SNF (pt from SNF but may need PT upon return due to declined mobility status)    Equipment Recommendations  Rolling walker with 5" wheels    Recommendations for Other Services       Precautions / Restrictions Precautions Precautions: Fall      Mobility  Bed Mobility Overal bed mobility: Needs Assistance Bed Mobility: Supine to Sit;Sit to Supine     Supine to sit: Supervision Sit to supine:  Supervision        Transfers Overall transfer level: Needs assistance Equipment used: Rolling walker (2 wheeled) Transfers: Sit to/from Stand Sit to Stand: Min assist         General transfer comment: Min A with cues for hand placement and L LE management for pain control  Ambulation/Gait Ambulation/Gait assistance: Min assist Gait Distance (Feet): 5 Feet (5'x2) Assistive device: Rolling walker (2 wheeled) Gait Pattern/deviations: Step-to pattern;Decreased stride length;Decreased weight shift to left;Antalgic Gait velocity: decreased   General Gait Details: Educated on step to L gait with use of UE on RW to relieve weight on L.  Pt started well but then UE fatigued and she was no longer able to offload L side.  Unable to bear weight on L.  She ambulated 5' to bsc then 5'back to bed but by sliding R foot so that she didn't have to weight shift to L.  Stairs            Wheelchair Mobility    Modified Rankin (Stroke Patients Only)       Balance Overall balance assessment: Needs assistance Sitting-balance support: No upper extremity supported Sitting balance-Leahy Scale: Good     Standing balance support: Bilateral upper extremity supported;No upper extremity supported Standing balance-Leahy Scale: Fair Standing balance comment: Could static stand without AD but required RW to ambulate due to L ankle pain                             Pertinent Vitals/Pain Pain Assessment: Faces Faces Pain Scale:  Hurts whole lot Pain Location: L ankle with weight bearing and inversion/eversion Pain Descriptors / Indicators: Discomfort;Grimacing;Sharp Pain Intervention(s): Limited activity within patient's tolerance;Monitored during session    Home Living Family/patient expects to be discharged to:: Skilled nursing facility                      Prior Function Level of Independence: Independent         Comments: Pt is from SNF long term but reports she is  independent with ADLs and ambulation without AD.     Hand Dominance        Extremity/Trunk Assessment   Upper Extremity Assessment Upper Extremity Assessment: Overall WFL for tasks assessed    Lower Extremity Assessment Lower Extremity Assessment: LLE deficits/detail;RLE deficits/detail RLE Deficits / Details: ROM WFL; MMT 5/5 LLE Deficits / Details: Reports she twisted her L ankle yesterday.  ROM at ankle was [address] [facility] but painful with end range inversion and eversion. Mild pain when palpating lateral malleolus and lateral ligaments.  Very slight edema, no ecchymosis. Did not MMT.  Painful/unable to weight bear.  Notified RN and MD.  Hip and knee are [facility]    Cervical / Trunk Assessment Cervical / Trunk Assessment: Normal  Communication   Communication: Tracheostomy  Cognition Arousal/Alertness: Awake/alert Behavior During Therapy: WFL for tasks assessed/performed Overall Cognitive Status: Within Functional Limits for tasks assessed                                 General Comments: Somewhat difficulty due to decannulated trach but pt is able to speak when she covers site      General Comments General comments (skin integrity, edema, etc.): O2 sats 89-91% during eval.    Exercises     Assessment/Plan    PT Assessment Patient needs continued PT services  PT Problem List Decreased strength;Decreased mobility;Decreased range of motion;Decreased activity tolerance;Cardiopulmonary status limiting activity;Decreased balance;Decreased knowledge of use of DME;Pain       PT Treatment Interventions DME instruction;Therapeutic activities;Modalities;Gait training;Therapeutic exercise;Patient/family education;Balance training;Functional mobility training    PT Goals (Current goals can be found in the Care Plan section)  Acute Rehab PT Goals Patient Stated Goal: decrease ankle pain PT Goal Formulation: With patient Time For Goal Achievement: 11/13/20 Potential to Achieve  Goals: Fair    Frequency Min 2X/week   Barriers to discharge        Co-evaluation               AM-PAC PT "6 Clicks" Mobility  Outcome Measure Help needed turning from your back to your side while in a flat bed without using bedrails?: A Little Help needed moving from lying on your back to sitting on the side of a flat bed without using bedrails?: A Little Help needed moving to and from a bed to a chair (including a wheelchair)?: A Little Help needed standing up from a chair using your arms (e.g., wheelchair or bedside chair)?: A Little Help needed to walk in hospital room?: A Little Help needed climbing 3-5 steps with a railing? : A Lot 6 Click Score: 17    End of Session Equipment Utilized During Treatment: Gait belt Activity Tolerance: Patient limited by pain Patient left: in bed;with call bell/phone within reach;with bed alarm set Nurse Communication: Mobility status (notified RN and MD on secure chat regarding pt's ankle pain) PT Visit Diagnosis: Unsteadiness on feet (R26.81);Muscle weakness (generalized) (M62.81);Pain  Pain - Right/Left: Left Pain - part of body: Ankle and joints of foot    Time: 2244-9753 PT Time Calculation (min) (ACUTE ONLY): 24 min   Charges:   PT Evaluation $PT Eval Low Complexity: 1 Low PT Treatments $Gait Training: 8-22 mins        Abran Richard, PT Acute Rehab Services Pager [phone] [facility] [place] 10/30/2020, 4:48 PM

## 2020-10-31 DIAGNOSIS — E1100 Type 2 diabetes mellitus with hyperosmolarity without nonketotic hyperglycemic-hyperosmolar coma (NKHHC): Secondary | ICD-10-CM | POA: Diagnosis not present

## 2020-10-31 LAB — COMPREHENSIVE METABOLIC PANEL
ALT: 54 U/L — ABNORMAL HIGH (ref 0–44)
AST: 100 U/L — ABNORMAL HIGH (ref 15–41)
Albumin: 2.5 g/dL — ABNORMAL LOW (ref 3.5–5.0)
Alkaline Phosphatase: 122 U/L (ref 38–126)
Anion gap: 12 (ref 5–15)
BUN: 33 mg/dL — ABNORMAL HIGH (ref 6–20)
CO2: 19 mmol/L — ABNORMAL LOW (ref 22–32)
Calcium: 8.9 mg/dL (ref 8.9–10.3)
Chloride: 100 mmol/L (ref 98–111)
Creatinine, Ser: 2.7 mg/dL — ABNORMAL HIGH (ref 0.44–1.00)
GFR, Estimated: 20 mL/min — ABNORMAL LOW (ref >60)
Glucose, Bld: 157 mg/dL — ABNORMAL HIGH (ref 70–99)
Potassium: 3.9 mmol/L (ref 3.5–5.1)
Sodium: 131 mmol/L — ABNORMAL LOW (ref 135–145)
Total Bilirubin: 2.1 mg/dL — ABNORMAL HIGH (ref 0.3–1.2)
Total Protein: 6.7 g/dL (ref 6.5–8.1)

## 2020-10-31 LAB — CBC WITH DIFFERENTIAL/PLATELET
Abs Immature Granulocytes: 0.12 10*3/uL — ABNORMAL HIGH (ref 0.00–0.07)
Basophils Absolute: 0 10*3/uL (ref 0.0–0.1)
Basophils Relative: 0 %
Eosinophils Absolute: 0 10*3/uL (ref 0.0–0.5)
Eosinophils Relative: 0 %
HCT: 37.5 % (ref 36.0–46.0)
Hemoglobin: 12.6 g/dL (ref 12.0–15.0)
Immature Granulocytes: 2 %
Lymphocytes Relative: 18 %
Lymphs Abs: 1.2 10*3/uL (ref 0.7–4.0)
MCH: 31.7 pg (ref 26.0–34.0)
MCHC: 33.6 g/dL (ref 30.0–36.0)
MCV: 94.2 fL (ref 80.0–100.0)
Monocytes Absolute: 1.1 10*3/uL — ABNORMAL HIGH (ref 0.1–1.0)
Monocytes Relative: 17 %
Neutro Abs: 4.3 10*3/uL (ref 1.7–7.7)
Neutrophils Relative %: 63 %
Platelets: 53 10*3/uL — ABNORMAL LOW (ref 150–400)
RBC: 3.98 MIL/uL (ref 3.87–5.11)
RDW: 15.7 % — ABNORMAL HIGH (ref 11.5–15.5)
WBC: 6.8 10*3/uL (ref 4.0–10.5)
nRBC: 0 % (ref 0.0–0.2)

## 2020-10-31 LAB — GLUCOSE, CAPILLARY
Glucose-Capillary: 171 mg/dL — ABNORMAL HIGH (ref 70–99)
Glucose-Capillary: 195 mg/dL — ABNORMAL HIGH (ref 70–99)
Glucose-Capillary: 255 mg/dL — ABNORMAL HIGH (ref 70–99)
Glucose-Capillary: 233 mg/dL — ABNORMAL HIGH (ref 70–99)

## 2020-10-31 LAB — MAGNESIUM: Magnesium: 1.9 mg/dL (ref 1.7–2.4)

## 2020-10-31 MED ORDER — CETAPHIL MOISTURIZING EX LOTN
TOPICAL_LOTION | Freq: Two times a day (BID) | CUTANEOUS | Status: DC
  Administered 2020-11-01: 1 via TOPICAL
  Filled 2020-10-31: qty 473

## 2020-10-31 MED ORDER — NADOLOL 20 MG PO TABS
20.0000 mg | ORAL_TABLET | Freq: Every day | ORAL | Status: DC
  Administered 2020-10-31: 20 mg via ORAL
  Filled 2020-10-31 (×2): qty 1

## 2020-10-31 MED ORDER — INSULIN ASPART 100 UNIT/ML IJ SOLN
5.0000 [IU] | Freq: Three times a day (TID) | INTRAMUSCULAR | Status: DC
  Administered 2020-10-31 – 2020-11-01 (×3): 5 [IU] via SUBCUTANEOUS

## 2020-10-31 MED ORDER — LIDOCAINE 5 % EX PTCH
1.0000 | MEDICATED_PATCH | Freq: Every day | CUTANEOUS | Status: DC
  Administered 2020-10-31 – 2020-11-01 (×2): 1 via TRANSDERMAL
  Filled 2020-10-31 (×3): qty 1

## 2020-10-31 NOTE — NC FL2 (Signed)
[place] LEVEL OF CARE SCREENING TOOL     IDENTIFICATION  Patient Name: Judy Meza Birthdate: 1960/10/27 Sex: female Admission Date (Current Location): 10/28/2020  [place] and [place] Number:  Kathleen Argue [id number] [place] and Address:  [facility] [place], [place] [address], [place], [place] [address]      Provider Number: [id number]  Attending Physician Name and Address:  Geradine Girt, DO  Relative Name and Phone Number:  Charisse Klinefelter Sister [phone]    Current Level of Care: Hospital Recommended Level of Care: [place] Prior Approval Number:    Date Approved/Denied:   PASRR Number: [id number]  Discharge Plan: SNF    Current Diagnoses: Patient Active Problem List   Diagnosis Date Noted   Hyperosmolar hyperglycemic state (HHS) ([place]) 10/28/2020   Tracheo-cutaneous fistula 09/27/2020   End stage liver disease ([place]) 11/11/2019   Secondary esophageal varices with bleeding ([place])    Esophageal stricture    Cirrhosis ([place])    Decompensated hepatic cirrhosis ([place]) 10/03/2019   Acute on chronic anemia 10/03/2019   Cancer screening 08/14/2019   Back pain with radiculopathy 08/07/2019   Recurrent falls 08/04/2019   Multiple falls at home 08/03/2019   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   Goals of care, counseling/discussion 07/28/2019   Acute encephalopathy 06/05/2019   Chest pain 06/04/2019   QT prolongation 27/78/2423   Metabolic acidosis 53/61/4431   Dementia without behavioral disturbance ([place]) 05/10/2019   Chronic hepatitis C ([place]) 05/10/2019   Hepatic encephalopathy 05/09/2019   Pancytopenia, acquired ([place])    Cirrhosis of liver ([place])    CKD (chronic kidney disease) stage 4, GFR 15-29 ml/min ([place]) 04/29/2019   COPD with chronic bronchitis ([place]) 04/29/2019   Tobacco abuse 04/29/2019   Alcoholism in remission ([place]) 04/29/2019   Acute hepatic encephalopathy 04/29/2019   AKI (acute kidney injury)  ([place]) 54/00/8676   Alcoholic cirrhosis of liver with ascites ([place]) 04/29/2019   Thrombocytopenia ([place]) 04/29/2019   Hyperkalemia 04/29/2019    Orientation RESPIRATION BLADDER Height & Weight     Self, Time, Situation, Place  Normal Continent Weight: 146 lb 9.7 oz (66.5 kg) Height:  5\' 1"  (154.9 cm)  BEHAVIORAL SYMPTOMS/MOOD NEUROLOGICAL BOWEL NUTRITION STATUS      Continent Diet (see discharge summary)  AMBULATORY STATUS COMMUNICATION OF NEEDS Skin   Limited Assist Verbally (previous trach) Normal, Other (Comment) (scar)                       Personal Care Assistance Level of Assistance  Bathing, Feeding, Dressing Bathing Assistance: Limited assistance Feeding assistance: Limited assistance Dressing Assistance: Limited assistance     Functional Limitations Info  Sight, Hearing, Speech     Speech Info: Impaired (previous trach)    [place]  PT (By licensed PT)     PT Frequency: 5x week              Contractures Contractures Info: Not present    Additional Factors Info  Code Status, Allergies Code Status Info: full Allergies Info: NKA           Current Medications (10/31/2020):  This is the current hospital active medication list Current Facility-Administered Medications  Medication Dose Route Frequency Provider Last Rate Last Admin   albuterol (PROVENTIL) (2.5 MG/3ML) 0.083% nebulizer solution 2.5 mg  2.5 mg Inhalation Q6H PRN Etta Quill, DO       dextrose 50 % solution 0-50 mL  0-50 mL Intravenous  PRN Carlisle Cater, PA-C       famotidine (PEPCID) tablet 20 mg  20 mg Oral Daily Jennette Kettle M, DO   20 mg at 49/44/96 7591   folic acid (FOLVITE) tablet 1 mg  1 mg Oral Daily Jennette Kettle M, DO   1 mg at 10/31/20 1037   gabapentin (NEURONTIN) capsule 100 mg  100 mg Oral TID Etta Quill, DO   100 mg at 10/31/20 1049   hydrocortisone cream 1 % 1 application  1 application Topical BID Etta Quill, DO   1 application  at 63/84/66 2139   insulin aspart (novoLOG) injection 0-15 Units  0-15 Units Subcutaneous TID WC Thurnell Lose, MD   3 Units at 10/31/20 1000   insulin aspart (novoLOG) injection 0-5 Units  0-5 Units Subcutaneous QHS Thurnell Lose, MD   2 Units at 10/30/20 2142   insulin glargine-yfgn (SEMGLEE) injection 15 Units  15 Units Subcutaneous Daily Eulogio Bear U, DO   15 Units at 10/31/20 1000   ketotifen (ZADITOR) 0.025 % ophthalmic solution 1 drop  1 drop Both Eyes Daily Etta Quill, DO   1 drop at 10/31/20 1037   lactulose (CHRONULAC) 10 GM/15ML solution 30 g  30 g Oral TID Etta Quill, DO   30 g at 10/31/20 1037   lidocaine (LIDODERM) 5 % 1 patch  1 patch Transdermal Daily Vann, Jessica U, DO       loratadine (CLARITIN) tablet 10 mg  10 mg Oral Daily Jennette Kettle M, DO   10 mg at 10/31/20 1037   nadolol (CORGARD) tablet 20 mg  20 mg Oral Daily Eulogio Bear U, DO   20 mg at 10/31/20 1037   ondansetron (ZOFRAN-ODT) disintegrating tablet 8 mg  8 mg Oral Q8H PRN Etta Quill, DO       polyvinyl alcohol (LIQUIFILM TEARS) 1.4 % ophthalmic solution 1 drop  1 drop Both Eyes QID Jennette Kettle M, DO   1 drop at 10/31/20 1050   rifaximin (XIFAXAN) tablet 550 mg  550 mg Oral BID Etta Quill, DO   550 mg at 10/31/20 1037   thiamine tablet 100 mg  100 mg Oral Daily Jennette Kettle M, DO   100 mg at 10/31/20 1050     Discharge Medications: Please see discharge summary for a list of discharge medications.  Relevant Imaging Results:  Relevant Lab Results:   Additional Information SSN [id number].  Joanne Chars, LCSW

## 2020-10-31 NOTE — Progress Notes (Signed)
Progress Note    DRAKE LANDING  WPY:[id number] DOB: 06/29/1960  DOA: 10/28/2020 PCP: Caprice Renshaw, MD    Brief Narrative:     Medical records reviewed and are as summarized below:  Judy Meza is an 60 y.o. female with elevated blood sugars and not feeling good she was found to be in nonketotic hyperosmolar state, dehydrated, hyponatremic with AKI and admitted to the hospital.  Slowly improving and plan is to go back to LTC with palliative care   Assessment/Plan:   Principal Problem:   Hyperosmolar hyperglycemic state (HHS) ([place]) Active Problems:   CKD (chronic kidney disease) stage 4, GFR 15-29 ml/min (HCC)   Alcoholism in remission ([place])   AKI (acute kidney injury) ([place])   Alcoholic cirrhosis of liver with ascites ([place])   Dementia without behavioral disturbance ([place])   Chronic hepatitis C ([place])   Nonketotic hyperosmolar state in a patient who was recently diagnosed with DM type II and was recently taking some steroids  -SSI -adjust long acting and meal coverage -HgbA1c: 12.6   Dehydration, hyponatremia, AKI on CKD 4  --improved after IVF  Alcoholic liver cirrhosis.   -Stable denies any ongoing alcohol abuse.  Supportive care.  Continue nadolol for portal hypertension- but lower dose   History of laryngeal cancer with tracheocutaneous fistula.   -Outpatient follow-up with ENT -currently stable.   -Tracheocutaneous fistula under bandage.   Recent skin rash requiring steroid use.   -Now off of systemic steroids.   -Supportive care outpatient dermatology follow-up.    Chr. Thrombocytopenia  -likely due to cirrhosis   Hypertension.   -Continue nadolol but a lower dose.    left ankle pain -no fracture -add brace    Family Communication/Anticipated D/C date and plan/Code Status   DVT prophylaxis: scd Code Status: DNR (reviewed records and discusses with MPOA) Disposition Plan: Status is: Inpatient  Return to sNF in AM         Medical  Consultants:   None.   Subjective:   C/o ankle pain  Objective:    Vitals:   10/30/20 0938 10/30/20 1200 10/31/20 0355 10/31/20 1238  BP: 113/71 110/69 (!) 91/56 105/75  Pulse:  77 75 69  Resp:   (!) 22 19  Temp:  98 F (36.7 C) 98 F (36.7 C) 97.6 F (36.4 C)  TempSrc:  Oral Axillary Oral  SpO2:  (!) 89% 95% 93%  Weight:      Height:        Intake/Output Summary (Last 24 hours) at 10/31/2020 1354 Last data filed at 10/30/2020 1700 Gross per 24 hour  Intake 364.57 ml  Output --  Net 364.57 ml   Filed Weights   10/28/20 1512 10/29/20 2100  Weight: 70.4 kg 66.5 kg    Exam:   General: Appearance:     Overweight female in no acute distress     Lungs:     respirations unlabored  Heart:    Normal heart rate.     MS:   All extremities are intact.            Data Reviewed:   I have personally reviewed following labs and imaging studies:  Labs: Labs show the following:   Basic Metabolic Panel: Recent Labs  Lab 10/28/20 1517 10/28/20 1537 10/28/20 2335 10/29/20 0218 10/30/20 0327 10/31/20 0254  NA 116* 116* 120* 123* 122* 131*  K 4.6 4.5 4.9 4.3 4.0 3.9  CL 80*  --  87* 95* 95* 100  CO2 24  --  23 22 18* 19*  GLUCOSE 755*  --  589* 396* 317* 157*  BUN 53*  --  51* 48* 41* 33*  CREATININE 4.03*  --  3.50* 3.25* 3.15* 2.70*  CALCIUM 9.8  --  8.9 8.3* 8.5* 8.9  MG  --   --   --   --  1.9 1.9   GFR Estimated Creatinine Clearance: 19.8 mL/min (A) (by C-G formula based on SCr of 2.7 mg/dL (H)). Liver Function Tests: Recent Labs  Lab 10/28/20 1517 10/30/20 0327 10/31/20 0254  AST 30 66* 100*  ALT 28 36 54*  ALKPHOS 98 87 122  BILITOT 2.7* 1.8* 2.1*  PROT 8.4* 6.0* 6.7  ALBUMIN 3.2* 2.3* 2.5*   Recent Labs  Lab 10/28/20 1517  LIPASE 68*   Recent Labs  Lab 10/28/20 2138  AMMONIA 44*   Coagulation profile Recent Labs  Lab 10/28/20 2138  INR 1.3*    CBC: Recent Labs  Lab 10/28/20 1517 10/28/20 1537 10/30/20 0327  10/31/20 0254  WBC 9.8  --  4.6 6.8  NEUTROABS 7.7  --  3.2 4.3  HGB 13.9 15.6* 11.2* 12.6  HCT 40.3 46.0 32.3* 37.5  MCV 92.4  --  93.4 94.2  PLT 88*  --  42* 53*   Cardiac Enzymes: No results for input(s): CKTOTAL, CKMB, CKMBINDEX, TROPONINI in the last 168 hours. BNP (last 3 results) No results for input(s): PROBNP in the last 8760 hours. CBG: Recent Labs  Lab 10/30/20 1740 10/30/20 2142 10/31/20 0731 10/31/20 0946 10/31/20 1236  GLUCAP 227* 205* 171* 195* 255*   D-Dimer: No results for input(s): DDIMER in the last 72 hours. Hgb A1c: Recent Labs    10/30/20 0327  HGBA1C 12.6*   Lipid Profile: No results for input(s): CHOL, HDL, LDLCALC, TRIG, CHOLHDL, LDLDIRECT in the last 72 hours. Thyroid function studies: No results for input(s): TSH, T4TOTAL, T3FREE, THYROIDAB in the last 72 hours.  Invalid input(s): FREET3 Anemia work up: No results for input(s): VITAMINB12, FOLATE, FERRITIN, TIBC, IRON, RETICCTPCT in the last 72 hours. Sepsis Labs: Recent Labs  Lab 10/28/20 1517 10/30/20 0327 10/31/20 0254  WBC 9.8 4.6 6.8    Microbiology Recent Results (from the past 240 hour(s))  Resp Panel by RT-PCR (Flu A&B, Covid) Urine, Clean Catch     Status: None   Collection Time: 10/28/20  8:57 PM   Specimen: Urine, Clean Catch; Nasopharyngeal(NP) swabs in vial transport medium  Result Value Ref Range Status   SARS Coronavirus 2 by RT PCR NEGATIVE NEGATIVE Final    Comment: (NOTE) SARS-CoV-2 target nucleic acids are NOT DETECTED.  The SARS-CoV-2 RNA is generally detectable in upper respiratory specimens during the acute phase of infection. The lowest concentration of SARS-CoV-2 viral copies this assay can detect is 138 copies/mL. A negative result does not preclude SARS-Cov-2 infection and should not be used as the sole basis for treatment or other patient management decisions. A negative result may occur with  improper specimen collection/handling, submission of  specimen other than nasopharyngeal swab, presence of viral mutation(s) within the areas targeted by this assay, and inadequate number of viral copies(<138 copies/mL). A negative result must be combined with clinical observations, patient history, and epidemiological information. The expected result is Negative.  Fact Sheet for Patients:  EntrepreneurPulse.com.au  Fact Sheet for Healthcare Providers:  IncredibleEmployment.be  This test is no t yet approved or cleared by the [place] FDA and  has been authorized for detection and/or  diagnosis of SARS-CoV-2 by FDA under an Emergency Use Authorization (EUA). This EUA will remain  in effect (meaning this test can be used) for the duration of the COVID-19 declaration under Section 564(b)(1) of the Act, 21 U.S.C.section 360bbb-3(b)(1), unless the authorization is terminated  or revoked sooner.       Influenza A by PCR NEGATIVE NEGATIVE Final   Influenza B by PCR NEGATIVE NEGATIVE Final    Comment: (NOTE) The Xpert Xpress SARS-CoV-2/FLU/RSV plus assay is intended as an aid in the diagnosis of influenza from Nasopharyngeal swab specimens and should not be used as a sole basis for treatment. Nasal washings and aspirates are unacceptable for Xpert Xpress SARS-CoV-2/FLU/RSV testing.  Fact Sheet for Patients: EntrepreneurPulse.com.au  Fact Sheet for Healthcare Providers: IncredibleEmployment.be  This test is not yet approved or cleared by the [place] FDA and has been authorized for detection and/or diagnosis of SARS-CoV-2 by FDA under an Emergency Use Authorization (EUA). This EUA will remain in effect (meaning this test can be used) for the duration of the COVID-19 declaration under Section 564(b)(1) of the Act, 21 U.S.C. section 360bbb-3(b)(1), unless the authorization is terminated or revoked.  Performed at [facility], [place] [address],  [place], [place] [address]     Procedures and diagnostic studies:  DG Ankle Complete Left  Result Date: 10/30/2020 CLINICAL DATA:  Fall. EXAM: LEFT ANKLE COMPLETE - 3+ VIEW COMPARISON:  None. FINDINGS: There is no evidence of fracture, dislocation, or joint effusion. There is no evidence of arthropathy or other focal bone abnormality. Soft tissues are unremarkable. IMPRESSION: Negative. Electronically Signed   By: Ronney Asters M.D.   On: 10/30/2020 19:09    Medications:    cetaphil   Topical BID   famotidine  20 mg Oral Daily   folic acid  1 mg Oral Daily   gabapentin  100 mg Oral TID   hydrocortisone cream  1 application Topical BID   insulin aspart  0-15 Units Subcutaneous TID WC   insulin aspart  0-5 Units Subcutaneous QHS   insulin aspart  5 Units Subcutaneous TID WC   insulin glargine-yfgn  15 Units Subcutaneous Daily   ketotifen  1 drop Both Eyes Daily   lactulose  30 g Oral TID   lidocaine  1 patch Transdermal Daily   loratadine  10 mg Oral Daily   nadolol  20 mg Oral Daily   polyvinyl alcohol  1 drop Both Eyes QID   rifaximin  550 mg Oral BID   thiamine  100 mg Oral Daily   Continuous Infusions:   LOS: 3 days   Geradine Girt  [facility]   How to contact the [facility] Attending or Consulting provider 7A - 7P or covering provider during after hours [place], for this patient?  Check the care team in [facility] and look for a) attending/consulting TRH provider listed and b) the [facility] team listed Log into [email or web address] and use [place]'s universal password to access. If you do not have the password, please contact the hospital operator. Locate the [facility] provider you are looking for under [facility] and page to a number that you can be directly reached. If you still have difficulty reaching the provider, please page the [facility] (Director on Call) for the Hospitalists listed on amion for assistance.  10/31/2020, 1:54 PM

## 2020-10-31 NOTE — TOC Initial Note (Addendum)
Transition of Care [facility]) - Initial/Assessment Note    Patient Details  Name: Judy Meza MRN: [id number] Date of Birth: [birth year]  Transition of [facility] CM/SW Contact:    Joanne Chars, LCSW Phone Number: 10/31/2020, 11:02 AM  Clinical Narrative:  CSW spoke with pt regarding return recommendation for SNF.  Pt is long term resident at [facility] and states she does want to return there at DC.  Permission given to speak with pt sisters, [name] and Shirlean Mylar.  Pt reports she is vaccinated for covid with both boosters.  CSW spoke with sister [name] who is in agreement with this plan.   CSW confirmed with Clarene Critchley at [facility] that pt will return.                   Expected Discharge Plan: Long Term Nursing Home Barriers to Discharge: Continued Medical Work up   Patient Goals and CMS Choice Patient states their goals for this hospitalization and ongoing recovery are:: "go home" CMS Medicare.gov Compare Post Acute Care list provided to::  (pt wanting to return to [place])    Expected Discharge Plan and Services Expected Discharge Plan: [place] Acute Care Choice: [place] Living arrangements for the past 2 months: [place]                                      Prior Living Arrangements/Services Living arrangements for the past 2 months: [place] Lives with:: Facility Resident Patient language and need for interpreter reviewed:: Yes Do you feel safe going back to the place where you live?: Yes      Need for Family Participation in Patient Care: Yes (Comment) Care giver support system in place?: Yes (comment) Current home services: Other (comment) (none) Criminal Activity/Legal Involvement Pertinent to Current Situation/Hospitalization: No - Comment as needed  Activities of Daily Living      Permission Sought/Granted Permission sought to share information with : Family  Supports Permission granted to share information with : Yes, Verbal Permission Granted  Share Information with NAME: sisters Jacelyn Grip and Shirlean Mylar  Permission granted to share info w AGENCY: [employer]        Emotional Assessment Appearance:: Appears older than stated age Attitude/Demeanor/Rapport: Engaged Affect (typically observed): Appropriate, Pleasant Orientation: :  (not recorded.  appears oriented) Alcohol / Substance Use: Not Applicable Psych Involvement: No (comment)  Admission diagnosis:  Hyponatremia [E87.1] Chest pain [R07.9] Acute kidney injury superimposed on chronic kidney disease ([place]) [N17.9, N18.9] Hyperosmolar hyperglycemic state (HHS) ([place]) [E11.00] Patient Active Problem List   Diagnosis Date Noted   Hyperosmolar hyperglycemic state (HHS) ([place]) 10/28/2020   Tracheo-cutaneous fistula 09/27/2020   End stage liver disease ([place]) 11/11/2019   Secondary esophageal varices with bleeding ([place])    Esophageal stricture    Cirrhosis ([place])    Decompensated hepatic cirrhosis ([place]) 10/03/2019   Acute on chronic anemia 10/03/2019   Cancer screening 08/14/2019   Back pain with radiculopathy 08/07/2019   Recurrent falls 08/04/2019   Multiple falls at home 08/03/2019   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   Goals of care, counseling/discussion 07/28/2019   Acute encephalopathy 06/05/2019   Chest pain 06/04/2019   QT prolongation 07/08/9483   Metabolic acidosis 46/27/0350   Dementia without behavioral disturbance ([place]) 05/10/2019   Chronic hepatitis C ([place]) 05/10/2019   Hepatic encephalopathy 05/09/2019   Pancytopenia,  acquired [facility])    Cirrhosis of liver ([place])    CKD (chronic kidney disease) stage 4, GFR 15-29 ml/min ([place]) 04/29/2019   COPD with chronic bronchitis ([place]) 04/29/2019   Tobacco abuse 04/29/2019   Alcoholism in remission ([place]) 04/29/2019   Acute hepatic encephalopathy 04/29/2019   AKI (acute kidney injury) ([place]) 57/32/2025   Alcoholic cirrhosis of liver  with ascites ([place]) 04/29/2019   Thrombocytopenia ([place]) 04/29/2019   Hyperkalemia 04/29/2019   PCP:  Caprice Renshaw, MD Pharmacy:   [id number] - [place], [place] [address] Phone: [phone] Fax: [phone]  [facility] [place], [place] [facility] [address] [place] [address] Phone: [phone] Fax: [phone]     Social Determinants of Health (SDOH) Interventions    Readmission Risk Interventions Readmission Risk Prevention Plan 10/05/2019 08/07/2019 06/06/2019  Transportation Screening Complete Complete Complete  HRI or Home Care Consult - Not Complete -  [place] or Home Care Consult comments - needs SNF -  Social Work Consult for Recovery Care Planning/Counseling - Complete -  Palliative Care Screening - Not Applicable -  Medication Review Press photographer) Complete Complete Complete  PCP or Specialist appointment within 3-5 days of discharge Complete - -  [place] or Home Care Consult Complete - Complete  SW Recovery Care/Counseling Consult Complete - Complete  Palliative Care Screening Not Applicable - Not [place] Not Applicable - Complete

## 2020-11-01 DIAGNOSIS — E1100 Type 2 diabetes mellitus with hyperosmolarity without nonketotic hyperglycemic-hyperosmolar coma (NKHHC): Secondary | ICD-10-CM | POA: Diagnosis not present

## 2020-11-01 LAB — RESP PANEL BY RT-PCR (FLU A&B, COVID) ARPGX2
Influenza A by PCR: NEGATIVE
Influenza B by PCR: NEGATIVE
SARS Coronavirus 2 by RT PCR: NEGATIVE

## 2020-11-01 LAB — COMPREHENSIVE METABOLIC PANEL
ALT: 48 U/L — ABNORMAL HIGH (ref 0–44)
AST: 82 U/L — ABNORMAL HIGH (ref 15–41)
Albumin: 2.2 g/dL — ABNORMAL LOW (ref 3.5–5.0)
Alkaline Phosphatase: 121 U/L (ref 38–126)
Anion gap: 8 (ref 5–15)
BUN: 23 mg/dL — ABNORMAL HIGH (ref 6–20)
CO2: 16 mmol/L — ABNORMAL LOW (ref 22–32)
Calcium: 8.4 mg/dL — ABNORMAL LOW (ref 8.9–10.3)
Chloride: 104 mmol/L (ref 98–111)
Creatinine, Ser: 2.19 mg/dL — ABNORMAL HIGH (ref 0.44–1.00)
GFR, Estimated: 25 mL/min — ABNORMAL LOW (ref >60)
Glucose, Bld: 237 mg/dL — ABNORMAL HIGH (ref 70–99)
Potassium: 4.2 mmol/L (ref 3.5–5.1)
Sodium: 128 mmol/L — ABNORMAL LOW (ref 135–145)
Total Bilirubin: 2 mg/dL — ABNORMAL HIGH (ref 0.3–1.2)
Total Protein: 5.7 g/dL — ABNORMAL LOW (ref 6.5–8.1)

## 2020-11-01 LAB — CBC WITH DIFFERENTIAL/PLATELET
Abs Immature Granulocytes: 0.09 10*3/uL — ABNORMAL HIGH (ref 0.00–0.07)
Basophils Absolute: 0 10*3/uL (ref 0.0–0.1)
Basophils Relative: 0 %
Eosinophils Absolute: 0.1 10*3/uL (ref 0.0–0.5)
Eosinophils Relative: 1 %
HCT: 34.9 % — ABNORMAL LOW (ref 36.0–46.0)
Hemoglobin: 11.7 g/dL — ABNORMAL LOW (ref 12.0–15.0)
Immature Granulocytes: 1 %
Lymphocytes Relative: 16 %
Lymphs Abs: 1.1 10*3/uL (ref 0.7–4.0)
MCH: 31.6 pg (ref 26.0–34.0)
MCHC: 33.5 g/dL (ref 30.0–36.0)
MCV: 94.3 fL (ref 80.0–100.0)
Monocytes Absolute: 0.9 10*3/uL (ref 0.1–1.0)
Monocytes Relative: 13 %
Neutro Abs: 4.5 10*3/uL (ref 1.7–7.7)
Neutrophils Relative %: 69 %
Platelets: 41 10*3/uL — ABNORMAL LOW (ref 150–400)
RBC: 3.7 MIL/uL — ABNORMAL LOW (ref 3.87–5.11)
RDW: 15.9 % — ABNORMAL HIGH (ref 11.5–15.5)
WBC: 6.6 10*3/uL (ref 4.0–10.5)
nRBC: 0 % (ref 0.0–0.2)

## 2020-11-01 LAB — GLUCOSE, CAPILLARY
Glucose-Capillary: 177 mg/dL — ABNORMAL HIGH (ref 70–99)
Glucose-Capillary: 269 mg/dL — ABNORMAL HIGH (ref 70–99)
Glucose-Capillary: 53 mg/dL — ABNORMAL LOW (ref 70–99)
Glucose-Capillary: 88 mg/dL (ref 70–99)

## 2020-11-01 LAB — MAGNESIUM: Magnesium: 1.7 mg/dL (ref 1.7–2.4)

## 2020-11-01 MED ORDER — LIDOCAINE 5 % EX PTCH: 1.0000 | MEDICATED_PATCH | Freq: Every day | CUTANEOUS | 0 refills | Status: AC

## 2020-11-01 MED ORDER — INSULIN ASPART 100 UNIT/ML IJ SOLN: 0.0000 [IU] | Freq: Three times a day (TID) | INTRAMUSCULAR | 11 refills | Status: DC

## 2020-11-01 MED ORDER — INSULIN GLARGINE-YFGN 100 UNIT/ML ~~LOC~~ SOLN: 18.0000 [IU] | Freq: Every day | SUBCUTANEOUS | 11 refills | Status: DC

## 2020-11-01 MED ORDER — INSULIN ASPART 100 UNIT/ML IJ SOLN: 5.0000 [IU] | Freq: Three times a day (TID) | INTRAMUSCULAR | 11 refills | Status: DC

## 2020-11-01 MED ORDER — SODIUM BICARBONATE 650 MG PO TABS
650.0000 mg | ORAL_TABLET | Freq: Every day | ORAL | Status: DC
  Administered 2020-11-01: 650 mg via ORAL
  Filled 2020-11-01: qty 1

## 2020-11-01 MED ORDER — ACETAMINOPHEN 325 MG PO TABS
650.0000 mg | ORAL_TABLET | Freq: Four times a day (QID) | ORAL | Status: DC | PRN
  Administered 2020-11-01: 650 mg via ORAL
  Filled 2020-11-01: qty 2

## 2020-11-01 MED ORDER — INSULIN GLARGINE-YFGN 100 UNIT/ML ~~LOC~~ SOLN: 18.0000 [IU] | Freq: Every day | SUBCUTANEOUS | Status: DC

## 2020-11-01 MED ORDER — DICLOFENAC SODIUM 1 % EX GEL
2.0000 g | Freq: Four times a day (QID) | CUTANEOUS | Status: DC
  Administered 2020-11-01 (×2): 2 g via TOPICAL
  Filled 2020-11-01: qty 100

## 2020-11-01 MED ORDER — DICLOFENAC SODIUM 1 % EX GEL: 2.0000 g | Freq: Four times a day (QID) | CUTANEOUS | Status: AC

## 2020-11-01 MED ORDER — NADOLOL 20 MG PO TABS: 20.0000 mg | ORAL_TABLET | Freq: Every day | ORAL | Status: AC

## 2020-11-01 NOTE — Plan of Care (Addendum)
Pt discharged to [place] care for palliative services. Report called to [phone] spoke with [facility]. Pt left with [facility] via stretcher. Sister [name] called and made aware of transfer. No personal belongings left at bedside.    Problem: Education: Goal: Knowledge of General Education information will improve Description: Including pain rating scale, medication(s)/side effects and non-pharmacologic comfort measures Outcome: Adequate for Discharge   Problem: Health Behavior/Discharge Planning: Goal: Ability to manage health-related needs will improve Outcome: Adequate for Discharge   Problem: Clinical Measurements: Goal: Ability to maintain clinical measurements within normal limits will improve Outcome: Adequate for Discharge Goal: Will remain free from infection Outcome: Adequate for Discharge Goal: Diagnostic test results will improve Outcome: Adequate for Discharge Goal: Respiratory complications will improve Outcome: Adequate for Discharge Goal: Cardiovascular complication will be avoided Outcome: Adequate for Discharge   Problem: Activity: Goal: Risk for activity intolerance will decrease Outcome: Adequate for Discharge   Problem: Nutrition: Goal: Adequate nutrition will be maintained Outcome: Adequate for Discharge   Problem: Coping: Goal: Level of anxiety will decrease Outcome: Adequate for Discharge   Problem: Elimination: Goal: Will not experience complications related to bowel motility Outcome: Adequate for Discharge Goal: Will not experience complications related to urinary retention Outcome: Adequate for Discharge   Problem: Pain Managment: Goal: General experience of comfort will improve Outcome: Adequate for Discharge   Problem: Safety: Goal: Ability to remain free from injury will improve Outcome: Adequate for Discharge   Problem: Skin Integrity: Goal: Risk for impaired skin integrity will decrease Outcome: Adequate for Discharge

## 2020-11-01 NOTE — Progress Notes (Signed)
Physical Therapy Treatment Patient Details Name: Judy Meza MRN: [id number] DOB: 04/08/1960 Today's Date: 11/01/2020   History of Present Illness Pt is 60 yo femaled admitted with hyperglycemia and in nonketotic hyperosmolar state, dehydrated, hyponatremic, with AKI on 10/28/20.  Pt with hx including COPD, laryngeal CA s/p radiation and trach complicated by tracheocutaneous fistula, HCV/EtOH cirrhosis, CKD 4, pancytopenia, NPH, OSA, recently dx DM    PT Comments    Pt able to amb in room with ankle braces providing support and pain control.  Can receive follow up PT at her long term care facility.  Recommendations for follow up therapy are one component of a multi-disciplinary discharge planning process, led by the attending physician.  Recommendations may be updated based on patient status, additional functional criteria and insurance authorization.  Follow Up Recommendations  Other (comment) (return to long term care with PT)     Equipment Recommendations  Rolling walker with 5" wheels (provided by facility)    Recommendations for Other Services       Precautions / Restrictions Precautions Precautions: Fall     Mobility  Bed Mobility               General bed mobility comments: Pt up on side of bed    Transfers Overall transfer level: Needs assistance Equipment used: Rolling walker (2 wheeled) Transfers: Sit to/from Stand Sit to Stand: Min assist         General transfer comment: Assist to bring hips up and incr time  Ambulation/Gait Ambulation/Gait assistance: Min guard Gait Distance (Feet): 25 Feet Assistive device: Rolling walker (2 wheeled) Gait Pattern/deviations: Step-through pattern;Decreased stride length;Antalgic Gait velocity: decreased Gait velocity interpretation: <1.31 ft/sec, indicative of household ambulator General Gait Details: Using rolling walker to decr weight on LLE allows pt to amb in room. Pt with ankle braces on bilateral  ankles providing support and pain relief.   Stairs             Wheelchair Mobility    Modified Rankin (Stroke Patients Only)       Balance Overall balance assessment: Needs assistance Sitting-balance support: No upper extremity supported Sitting balance-Leahy Scale: Good     Standing balance support: No upper extremity supported Standing balance-Leahy Scale: Fair                              Cognition Arousal/Alertness: Awake/alert Behavior During Therapy: WFL for tasks assessed/performed Overall Cognitive Status: Within Functional Limits for tasks assessed                                        Exercises      General Comments        Pertinent Vitals/Pain Pain Assessment: Faces Faces Pain Scale: Hurts a little bit Pain Location: lt ankle Pain Descriptors / Indicators: Guarding Pain Intervention(s): Limited activity within patient's tolerance;Other (comment) (ankle braces on)    Home Living                      Prior Function            PT Goals (current goals can now be found in the care plan section) Progress towards PT goals: Progressing toward goals    Frequency    Min 2X/week      PT Plan Current plan remains appropriate  Co-evaluation              AM-PAC PT "6 Clicks" Mobility   Outcome Measure  Help needed turning from your back to your side while in a flat bed without using bedrails?: None Help needed moving from lying on your back to sitting on the side of a flat bed without using bedrails?: A Little Help needed moving to and from a bed to a chair (including a wheelchair)?: A Little Help needed standing up from a chair using your arms (e.g., wheelchair or bedside chair)?: A Little Help needed to walk in hospital room?: A Little Help needed climbing 3-5 steps with a railing? : A Lot 6 Click Score: 18    End of Session   Activity Tolerance: Patient limited by pain Patient left: in  bed;with call bell/phone within reach;with bed alarm set Nurse Communication: Mobility status PT Visit Diagnosis: Other abnormalities of gait and mobility (R26.89);Pain Pain - Right/Left: Left Pain - part of body: Ankle and joints of foot     Time: 1359-1413 PT Time Calculation (min) (ACUTE ONLY): 14 min  Charges:  $Gait Training: 8-22 mins                     [place] Pager [phone] Office [place] 11/01/2020, 2:48 PM

## 2020-11-01 NOTE — Progress Notes (Signed)
Manufacturing engineer [facility]) Community Based Palliative Care       This patient is currently followed by palliative care services in the community.  [facility] will continue to follow for any discharge planning needs and to coordinate continuation of palliative care.    Thank you for the opportunity to participate in this patient's care.     Domenic Moras, BSN, RN [facility] Liaison [phone] (24h on call)

## 2020-11-01 NOTE — Progress Notes (Signed)
Orthopedic Tech Progress Note Patient Details:  Judy Meza 08-15-1960 [id number]  Ortho Devices Type of Ortho Device: ASO Ortho Device/Splint Location: BLE Ortho Device/Splint Interventions: Ordered, Application   Post Interventions Patient Tolerated: Well  Salvadore Valvano A [name] 11/01/2020, 1:33 PM

## 2020-11-01 NOTE — TOC Transition Note (Addendum)
Transition of Care [facility]) - CM/SW Discharge Note   Patient Details  Name: Judy Meza MRN: [id number] Date of Birth: 12-21-1960  Transition of Care [facility]) CM/SW Contact:  Joanne Chars, LCSW Phone Number: 11/01/2020, 1:09 PM   Clinical Narrative:  Pt discharging to [facility] for long term care.  RN call report to [phone].      1315: CSW spoke with Chrislyn at [facility], who confirmed that they are active for palliative care and will resume services.     Final next level of care: Long Term Nursing Home Barriers to Discharge: Barriers Resolved   Patient Goals and CMS Choice Patient states their goals for this hospitalization and ongoing recovery are:: "go home" CMS Medicare.gov Compare Post Acute Care list provided to::  (pt wanting to return to [place])    Discharge Placement              Patient chooses bed at:  ([facility]) Patient to be transferred to facility by: [place] Name of family member notified: sister [name] Patient and family notified of of transfer: 11/01/20  Discharge Plan and Services     Post Acute Care Choice: [place]                               Social Determinants of Health (SDOH) Interventions     Readmission Risk Interventions Readmission Risk Prevention Plan 10/05/2019 08/07/2019 06/06/2019  Transportation Screening Complete Complete Complete  HRI or Home Care Consult - Not Complete -  HRI or Home Care Consult comments - needs SNF -  Social Work Consult for [place] Planning/Counseling - Complete -  Palliative Care Screening - Not Applicable -  Medication Review Press photographer) Complete Complete Complete  PCP or Specialist appointment within 3-5 days of discharge Complete - -  [place] or Home Care Consult Complete - Complete  SW Recovery Care/Counseling Consult Complete - Complete  Palliative Care Screening Not Applicable - Not [place] Not Applicable - Complete

## 2020-11-01 NOTE — Discharge Summary (Signed)
Physician Discharge Summary  Judy Meza YBW:[id number] DOB: 12/15/1960 DOA: 10/28/2020  PCP: Caprice Renshaw, MD  Admit date: 10/28/2020 Discharge date: 11/01/2020  Admitted From: facility Discharge disposition: facility   Recommendations for Outpatient Follow-Up:   Check blood sugar QID- fasting and with meals-- will need continued adjustment of insulin at facility SSI: CBG 70 - 120: 0 units CBG 121 - 150: 2 units CBG 151 - 200: 3 units CBG 201 - 250: 5 units CBG 251 - 300: 8 units CBG 301 - 350: 11 units CBG 351 - 400: 15 units 3.  Continue palliative care services 4.  BMP 1 week and then consider resumption of lasix/aldactone 5.  PT 6.  Brace on left ankle for presumed sprain (avoid narcotic pain meds)-- use tylenol, RICE, lidocaine/voltaren gel   Discharge Diagnosis:   Principal Problem:   Hyperosmolar hyperglycemic state (HHS) ([place]) Active Problems:   CKD (chronic kidney disease) stage 4, GFR 15-29 ml/min (HCC)   Alcoholism in remission ([place])   AKI (acute kidney injury) ([place])   Alcoholic cirrhosis of liver with ascites ([place])   Dementia without behavioral disturbance ([place])   Chronic hepatitis C ([place])    Discharge Condition: Improved.  Diet recommendation: Low sodium, heart healthy.  Carbohydrate-modified.  Code status: DNR   History of Present Illness:   Judy Meza is a 60 y.o. female with medical history significant of COPD, laryngeal CA s/p radiation and tracheostomy, complicated by tracheocutaneous fistula; HCV/EtOH cirrhosis, CKD 4, pancytopenia, NPH, OSA.   Pt recently with new diagnosis of DM2 as of 09/17/20.   Pt with procedure in Sept 2022 to try and close tracheocutaneous fistula, but had to be canceled due to high BGLs.   Pt presents to ED today for elevated BGL, nausea, "feeling bad".   Pt on prednisone for rash in rectal area over past couple of weeks.  Not clear if still on this med, not clear of dosing.   Today, pt found to have  very high BGLs and EMS called.  Pt brought to ED.  BGL reading high so pt given 14u humalog prior to arrival.   Patient has vague complaints of not feeling well.  She states that she vomited 1 time today.  No significant abdominal pain.  No fevers or URI symptoms reported.  She does endorse being very thirsty and urinating a lot.   Symptoms constant, severe, persistent.     Hospital Course by Problem:   Nonketotic hyperosmolar state in a patient who was recently diagnosed with DM type II and was recently taking some steroids  -SSI with meals -adjust long acting and meal coverage for better coverage at facility -HgbA1c: 12.6    Dehydration, hyponatremia, AKI on CKD 4  --improved after IVF (lasix and aldactone on hold for now)   Alcoholic liver cirrhosis.   -Stable denies any ongoing alcohol abuse.  Supportive care.  Continue nadolol for portal hypertension- but lower dose -hold lasix/aldactone until BMP done in 1 week   History of laryngeal cancer with tracheocutaneous fistula.   -Outpatient follow-up with ENT -currently stable.   -Tracheocutaneous fistula under bandage.   Recent skin rash requiring steroid use.   -Now off of systemic steroids.   -Supportive care outpatient dermatology follow-up.    Chr. Thrombocytopenia  -likely due to cirrhosis   Hypertension.   -Continue nadolol but a lower dose.    left ankle pain -no fracture -add brace -topical pain control in addition to tylenol  Medical Consultants:      Discharge Exam:   Vitals:   10/31/20 2050 11/01/20 0510  BP: 120/84 94/62  Pulse: 78 76  Resp: (!) 22 16  Temp: 97.7 F (36.5 C) 97.6 F (36.4 C)  SpO2: 90% 92%   Vitals:   10/31/20 1238 10/31/20 1905 10/31/20 2050 11/01/20 0510  BP: 105/75  120/84 94/62  Pulse: 69  78 76  Resp: 19  (!) 22 16  Temp: 97.6 F (36.4 C)  97.7 F (36.5 C) 97.6 F (36.4 C)  TempSrc: Oral  Oral Axillary  SpO2: 93% 93% 90% 92%  Weight:      Height:         General exam: Appears calm and comfortable.  The results of significant diagnostics from this hospitalization (including imaging, microbiology, ancillary and laboratory) are listed below for reference.     Procedures and Diagnostic Studies:   DG Chest 1 View  Result Date: 10/28/2020 CLINICAL DATA:  Chest burning. EXAM: CHEST  1 VIEW COMPARISON:  Chest x-ray dated November 11, 2019. FINDINGS: The heart size and mediastinal contours are within normal limits. Both lungs are clear. The visualized skeletal structures are unremarkable. IMPRESSION: No active disease. Electronically Signed   By: Titus Dubin M.D.   On: 10/28/2020 17:17     Labs:   Basic Metabolic Panel: Recent Labs  Lab 10/28/20 2335 10/29/20 0218 10/30/20 0327 10/31/20 0254 11/01/20 0232  NA 120* 123* 122* 131* 128*  K 4.9 4.3 4.0 3.9 4.2  CL 87* 95* 95* 100 104  CO2 23 22 18* 19* 16*  GLUCOSE 589* 396* 317* 157* 237*  BUN 51* 48* 41* 33* 23*  CREATININE 3.50* 3.25* 3.15* 2.70* 2.19*  CALCIUM 8.9 8.3* 8.5* 8.9 8.4*  MG  --   --  1.9 1.9 1.7   GFR Estimated Creatinine Clearance: 24.4 mL/min (A) (by C-G formula based on SCr of 2.19 mg/dL (H)). Liver Function Tests: Recent Labs  Lab 10/28/20 1517 10/30/20 0327 10/31/20 0254 11/01/20 0232  AST 30 66* 100* 82*  ALT 28 36 54* 48*  ALKPHOS 98 87 122 121  BILITOT 2.7* 1.8* 2.1* 2.0*  PROT 8.4* 6.0* 6.7 5.7*  ALBUMIN 3.2* 2.3* 2.5* 2.2*   Recent Labs  Lab 10/28/20 1517  LIPASE 68*   Recent Labs  Lab 10/28/20 2138  AMMONIA 44*   Coagulation profile Recent Labs  Lab 10/28/20 2138  INR 1.3*    CBC: Recent Labs  Lab 10/28/20 1517 10/28/20 1537 10/30/20 0327 10/31/20 0254 11/01/20 0232  WBC 9.8  --  4.6 6.8 6.6  NEUTROABS 7.7  --  3.2 4.3 4.5  HGB 13.9 15.6* 11.2* 12.6 11.7*  HCT 40.3 46.0 32.3* 37.5 34.9*  MCV 92.4  --  93.4 94.2 94.3  PLT 88*  --  42* 53* 41*   Cardiac Enzymes: No results for input(s): CKTOTAL, CKMB, CKMBINDEX,  TROPONINI in the last 168 hours. BNP: Invalid input(s): POCBNP CBG: Recent Labs  Lab 10/31/20 0731 10/31/20 0946 10/31/20 1236 10/31/20 1629 11/01/20 0810  GLUCAP 171* 195* 255* 233* 177*   D-Dimer No results for input(s): DDIMER in the last 72 hours. Hgb A1c Recent Labs    10/30/20 0327  HGBA1C 12.6*   Lipid Profile No results for input(s): CHOL, HDL, LDLCALC, TRIG, CHOLHDL, LDLDIRECT in the last 72 hours. Thyroid function studies No results for input(s): TSH, T4TOTAL, T3FREE, THYROIDAB in the last 72 hours.  Invalid input(s): FREET3 Anemia work up No results for input(s):  VITAMINB12, FOLATE, FERRITIN, TIBC, IRON, RETICCTPCT in the last 72 hours. Microbiology Recent Results (from the past 240 hour(s))  Resp Panel by RT-PCR (Flu A&B, Covid) Urine, Clean Catch     Status: None   Collection Time: 10/28/20  8:57 PM   Specimen: Urine, Clean Catch; Nasopharyngeal(NP) swabs in vial transport medium  Result Value Ref Range Status   SARS Coronavirus 2 by RT PCR NEGATIVE NEGATIVE Final    Comment: (NOTE) SARS-CoV-2 target nucleic acids are NOT DETECTED.  The SARS-CoV-2 RNA is generally detectable in upper respiratory specimens during the acute phase of infection. The lowest concentration of SARS-CoV-2 viral copies this assay can detect is 138 copies/mL. A negative result does not preclude SARS-Cov-2 infection and should not be used as the sole basis for treatment or other patient management decisions. A negative result may occur with  improper specimen collection/handling, submission of specimen other than nasopharyngeal swab, presence of viral mutation(s) within the areas targeted by this assay, and inadequate number of viral copies(<138 copies/mL). A negative result must be combined with clinical observations, patient history, and epidemiological information. The expected result is Negative.  Fact Sheet for Patients:  EntrepreneurPulse.com.au  Fact  Sheet for Healthcare Providers:  IncredibleEmployment.be  This test is no t yet approved or cleared by the [place] FDA and  has been authorized for detection and/or diagnosis of SARS-CoV-2 by FDA under an Emergency Use Authorization (EUA). This EUA will remain  in effect (meaning this test can be used) for the duration of the COVID-19 declaration under Section 564(b)(1) of the Act, 21 U.S.C.section 360bbb-3(b)(1), unless the authorization is terminated  or revoked sooner.       Influenza A by PCR NEGATIVE NEGATIVE Final   Influenza B by PCR NEGATIVE NEGATIVE Final    Comment: (NOTE) The Xpert Xpress SARS-CoV-2/FLU/RSV plus assay is intended as an aid in the diagnosis of influenza from Nasopharyngeal swab specimens and should not be used as a sole basis for treatment. Nasal washings and aspirates are unacceptable for Xpert Xpress SARS-CoV-2/FLU/RSV testing.  Fact Sheet for Patients: EntrepreneurPulse.com.au  Fact Sheet for Healthcare Providers: IncredibleEmployment.be  This test is not yet approved or cleared by the [place] FDA and has been authorized for detection and/or diagnosis of SARS-CoV-2 by FDA under an Emergency Use Authorization (EUA). This EUA will remain in effect (meaning this test can be used) for the duration of the COVID-19 declaration under Section 564(b)(1) of the Act, 21 U.S.C. section 360bbb-3(b)(1), unless the authorization is terminated or revoked.  Performed at [facility], [place] [address] [place], [place] [address]      Discharge Instructions:   Discharge Instructions     Diet - low sodium heart healthy   Complete by: As directed    Increase activity slowly   Complete by: As directed    No wound care   Complete by: As directed       Allergies as of 11/01/2020   No Known Allergies      Medication List     STOP taking these medications    blood glucose meter  kit and supplies Kit   furosemide 40 MG tablet Commonly known as: LASIX   HYDROcodone-acetaminophen 7.5-325 MG tablet Commonly known as: Norco   insulin glargine-yfgn 100 UNIT/ML Pen Commonly known as: SEMGLEE Replaced by: insulin glargine-yfgn 100 UNIT/ML injection   insulin lispro 100 UNIT/ML KwikPen Commonly known as: HUMALOG   LANTUS SOLOSTAR [place]   sitaGLIPtin 25 MG tablet Commonly known  as: JANUVIA   spironolactone 100 MG tablet Commonly known as: ALDACTONE       TAKE these medications    acetaminophen 325 MG tablet Commonly known as: TYLENOL Take 650 mg by mouth every 4 (four) hours as needed for mild pain or fever.   albuterol 108 (90 Base) MCG/ACT inhaler Commonly known as: VENTOLIN HFA Inhale 2 puffs into the lungs every 6 (six) hours as needed for shortness of breath.   Benadryl Itch Stopping cream Generic drug: diphenhydrAMINE-zinc acetate Apply 1 application topically every 4 (four) hours as needed for itching.   diclofenac Sodium 1 % Gel Commonly known as: VOLTAREN Apply 2 g topically 4 (four) times daily. Left ankle and leg   ergocalciferol 1.25 MG (50000 UT) capsule Commonly known as: VITAMIN D2 Take 50,000 Units by mouth every Friday.   eucerin cream Apply 1 application topically every 12 (twelve) hours as needed for dry skin.   famotidine 20 MG tablet Commonly known as: PEPCID Take 20 mg by mouth daily.   folic acid 1 MG tablet Commonly known as: FOLVITE Take 1 tablet (1 mg total) by mouth daily.   gabapentin 100 MG capsule Commonly known as: NEURONTIN Take 100 mg by mouth 3 (three) times daily.   hydrocortisone 2.5 % cream Apply 1 application topically See admin instructions. Apply to back of neck every shift.   hydrOXYzine 25 MG tablet Commonly known as: ATARAX/VISTARIL Take 25 mg by mouth every 6 (six) hours as needed for itching.   insulin aspart 100 UNIT/ML injection Commonly known as: novoLOG Inject 5 Units into the skin 3  (three) times daily with meals. If eating >50% of meals   insulin aspart 100 UNIT/ML injection Commonly known as: novoLOG Inject 0-15 Units into the skin 3 (three) times daily with meals.   insulin glargine-yfgn 100 UNIT/ML injection Commonly known as: SEMGLEE Inject 0.18 mLs (18 Units total) into the skin daily. Start taking on: November 02, 2020 Replaces: insulin glargine-yfgn 100 UNIT/ML Pen   ketotifen 0.025 % ophthalmic solution Commonly known as: ZADITOR Place 1 drop into both eyes See admin instructions. Qd x 21 days   lactulose 10 GM/15ML solution Commonly known as: CHRONULAC Take 30 g by mouth in the morning, at noon, in the evening, and at bedtime.   lidocaine 5 % Commonly known as: LIDODERM Place 1 patch onto the skin daily. Remove & Discard patch within 12 hours or as directed by MD Start taking on: November 02, 2020   loratadine 10 MG tablet Commonly known as: CLARITIN Take 10 mg by mouth daily.   nadolol 20 MG tablet Commonly known as: CORGARD Take 1 tablet (20 mg total) by mouth daily. Start taking on: November 02, 2020 What changed:  medication strength how much to take   Olopatadine HCl 0.2 % Soln Place 1 drop into both eyes daily as needed (allergic conjunctivitis).   ondansetron 8 MG disintegrating tablet Commonly known as: Zofran ODT Take 1 tablet (8 mg total) by mouth every 8 (eight) hours as needed for nausea or vomiting.   PARoxetine 40 MG tablet Commonly known as: PAXIL Take 40 mg by mouth daily.   Refresh Optive 1-0.9 % Gel Generic drug: Carboxymethylcellul-Glycerin Place 1 drop into both eyes 4 (four) times daily.   rifaximin 550 MG Tabs tablet Commonly known as: XIFAXAN Take 1 tablet (550 mg total) by mouth 2 (two) times daily.   sodium bicarbonate 650 MG tablet Take 1 tablet (650 mg total) by mouth 2 (two)  times daily.   thiamine 100 MG tablet Take 1 tablet (100 mg total) by mouth daily.          Time coordinating discharge:  35 min  Signed:  Geradine Girt DO  [facility] 11/01/2020, 11:45 AM

## 2020-12-18 IMAGING — CT CT HEAD W/O CM
3 series · 15 of 47 positions shown, 18 images · non-contrast
Comparison: None.

CLINICAL DATA: Altered mental status with bilateral leg weakness.

EXAM:
CT HEAD WITHOUT CONTRAST
TECHNIQUE: Contiguous axial images were obtained from the base of the skull
through the vertex without intravenous contrast.

[Series 2: head wo · axial · 0.47mm/px · z∈[-158,-28]mm · 9 of 32 slices shown, 12 images]
[im 3/32  brain]
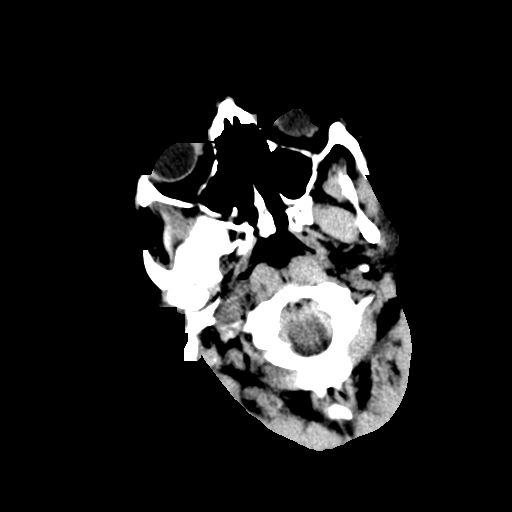
[im 3/32  bone]
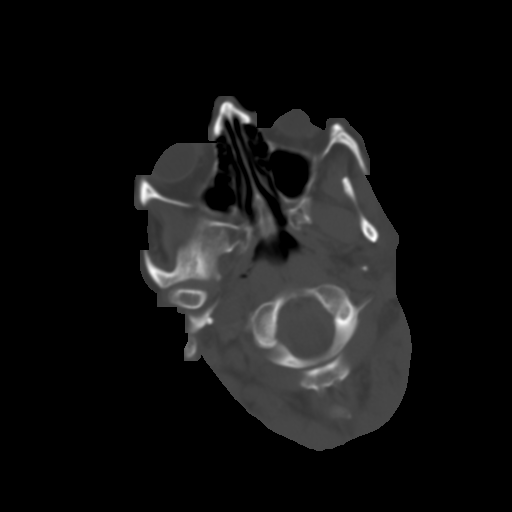
[im 6/32  brain]
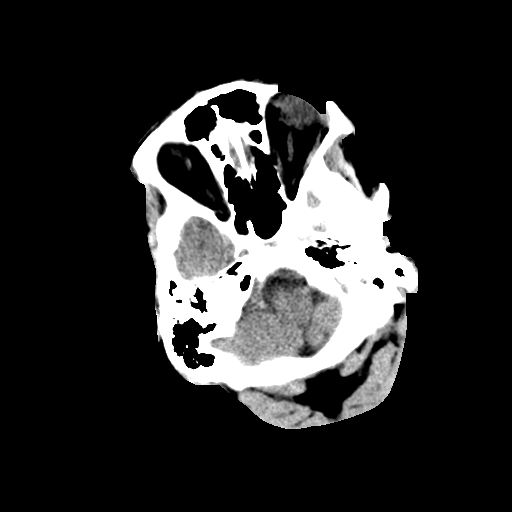
[im 9/32  brain]
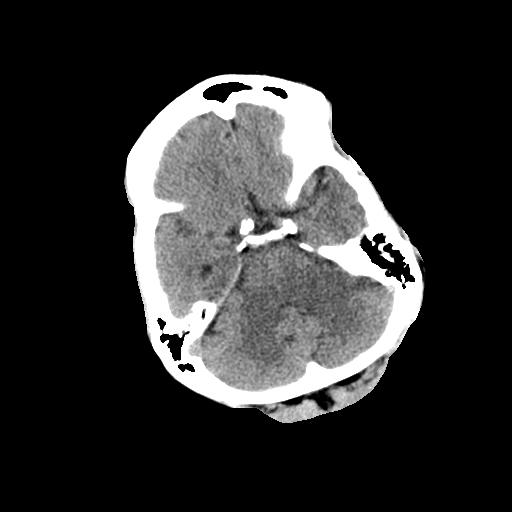
[im 12/32  brain]
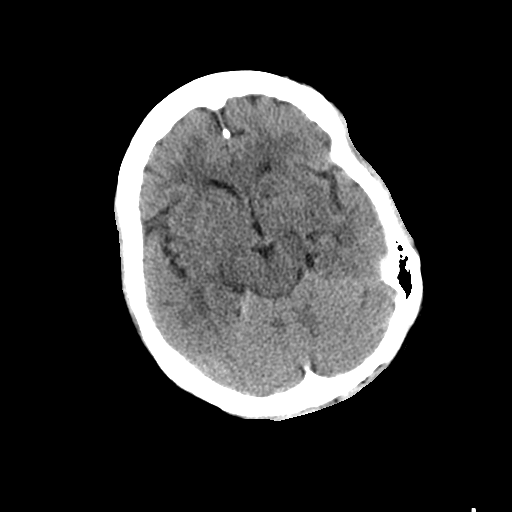
[im 17/32  brain]
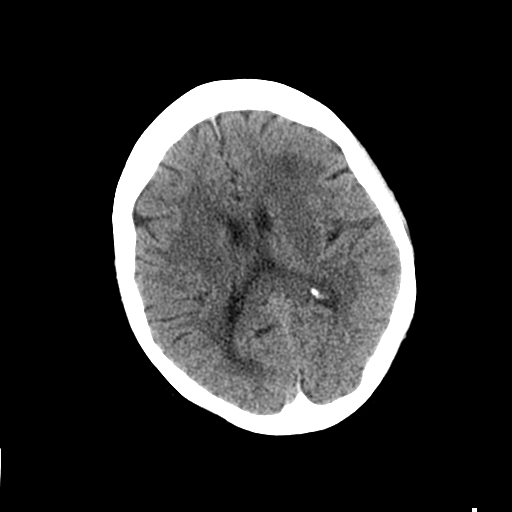
[im 17/32  bone]
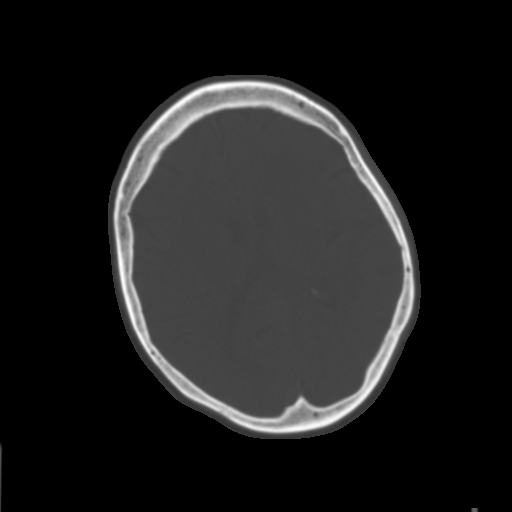
[im 20/32  brain]
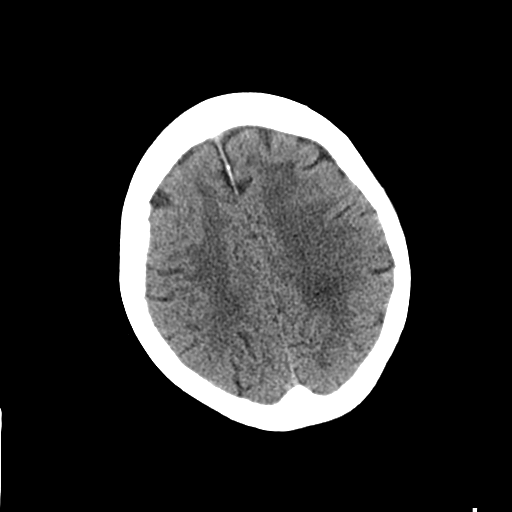
[im 23/32  brain]
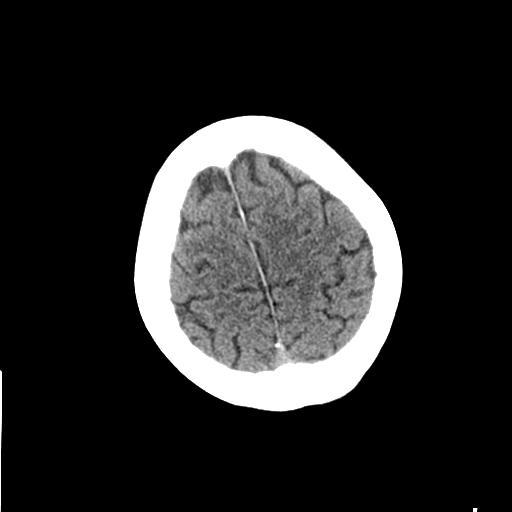
[im 26/32  brain]
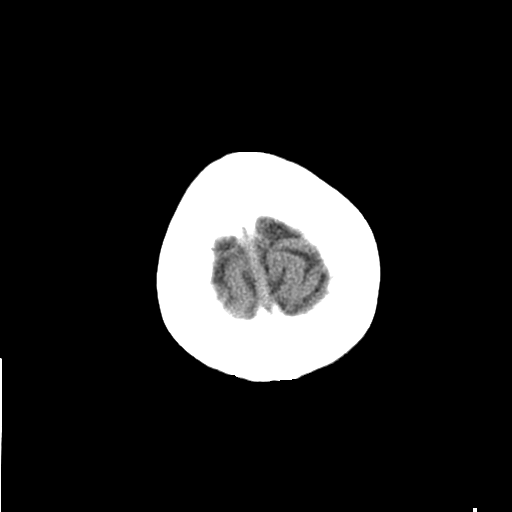
[im 29/32  brain]
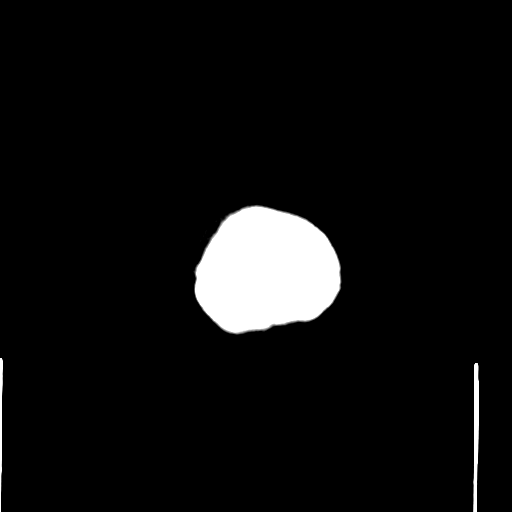
[im 29/32  bone]
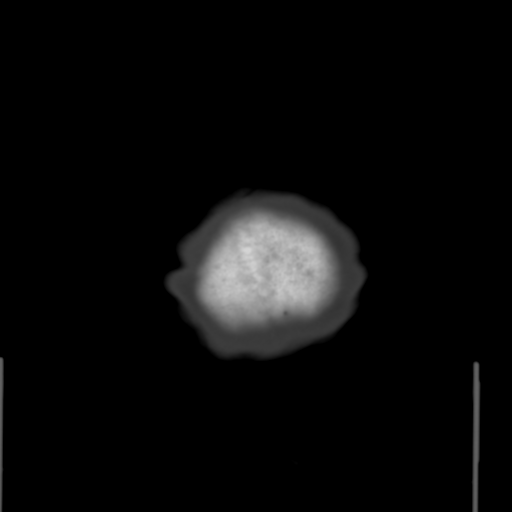

[Series 4: coronal soft tissue · coronal · 0.28mm/px · 3 of 61 slices shown]
[im 21/61  brain]
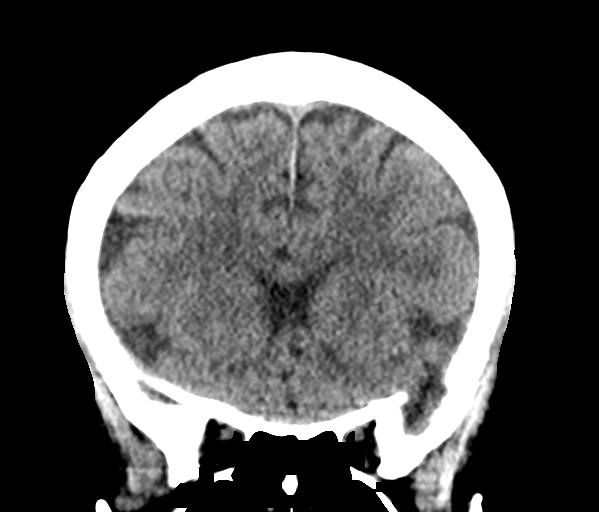
[im 27/61  brain]
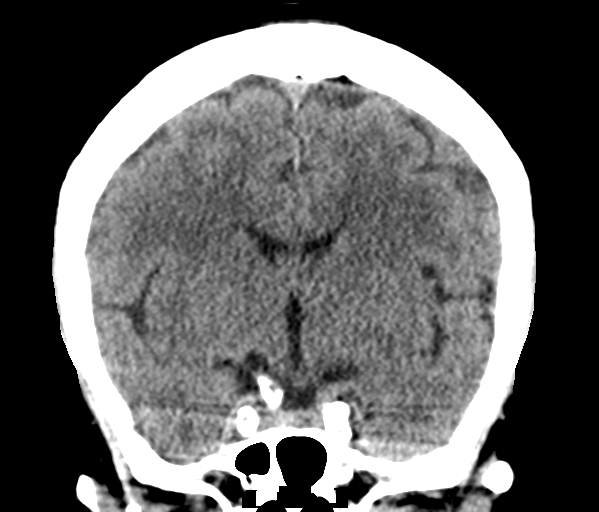
[im 34/61  brain]
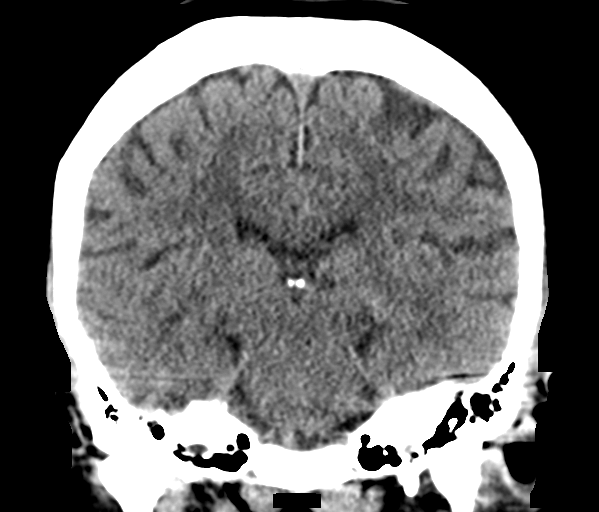

[Series 5: sagittal soft tissue · sagittal · 0.29mm/px · 3 of 56 slices shown]
[im 19/56  brain]
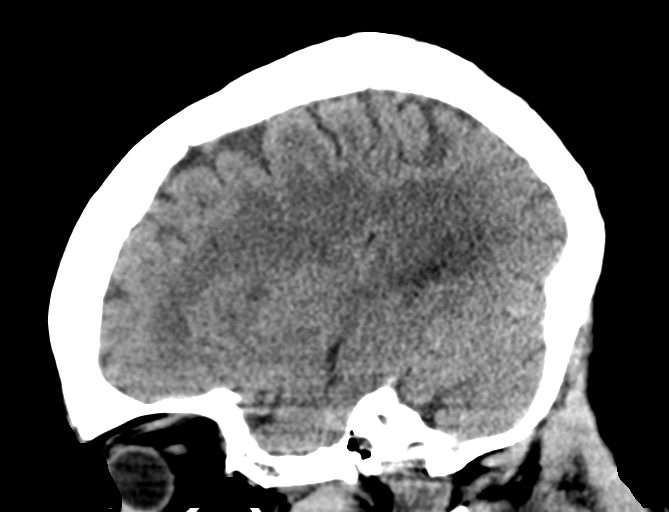
[im 28/56  brain]
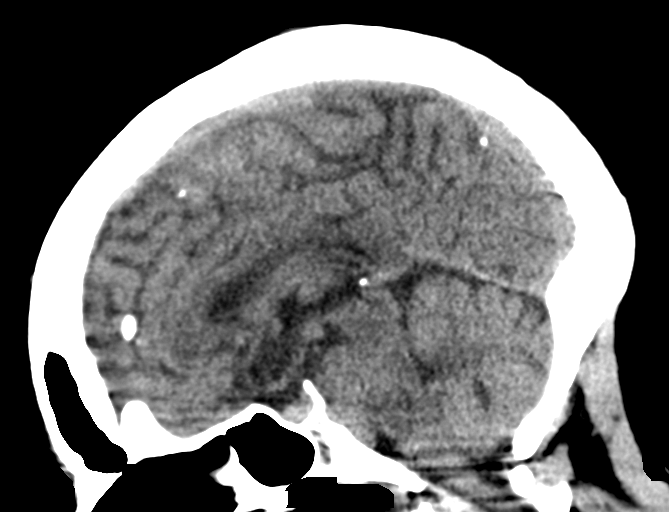
[im 37/56  brain]
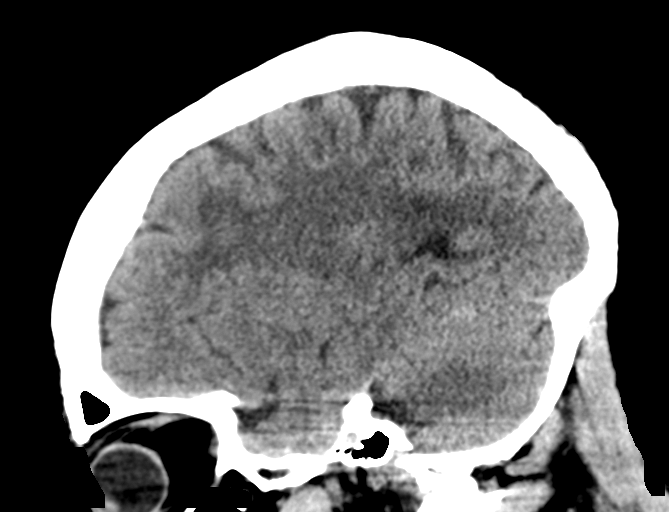

[15 of 47 positions shown; findings below may reference images not displayed]

FINDINGS: Brain: There is mild cerebral atrophy with widening of the
extra-axial spaces and ventricular dilatation.
There are areas of decreased attenuation within the white matter
tracts of the supratentorial brain, consistent with microvascular
disease changes.

Vascular: No hyperdense vessel or unexpected calcification.

Skull: Normal. Negative for fracture or focal lesion.

Sinuses/Orbits: No acute finding.

Other: None.
IMPRESSION: 1. Generalized cerebral atrophy.
2. No acute intracranial abnormality.

## 2021-01-17 ENCOUNTER — Other Ambulatory Visit: Payer: Self-pay

## 2021-01-17 ENCOUNTER — Non-Acute Institutional Stay: Payer: Medicaid Other | Admitting: Hospice

## 2021-01-17 DIAGNOSIS — Z515 Encounter for palliative care: Secondary | ICD-10-CM

## 2021-01-17 DIAGNOSIS — L299 Pruritus, unspecified: Secondary | ICD-10-CM

## 2021-01-17 DIAGNOSIS — K7031 Alcoholic cirrhosis of liver with ascites: Secondary | ICD-10-CM

## 2021-01-17 DIAGNOSIS — F339 Major depressive disorder, recurrent, unspecified: Secondary | ICD-10-CM

## 2021-01-17 NOTE — Progress Notes (Addendum)
[facility] Consult Note Telephone: [phone]  Fax: [phone]  PATIENT NAME: Judy Meza DOB: Mar 01, 1960 MRN: [id number]  PRIMARY CARE PROVIDER:   Caprice Renshaw, MD Caprice Renshaw, [address] [place],  [place] [address]  REFERRING PROVIDER: Caprice Renshaw, MD Caprice Renshaw, [address] [place],  [place] [address]   RESPONSIBLE PARTY: [name] is guardian of person Contact Information     Name Relation Home Work Edna Sister [phone]  [phone]   Vernida, Mcnicholas [phone]  [phone]       Visit is to build trust and highlight Palliative Medicine as specialized medical care for people living with serious illness, aimed at facilitating better quality of life through symptoms relief, assisting with advance care planning and complex medical decision making. This is a follow up visit.  RECOMMENDATIONS/PLAN:    Advance Care Planning/Code Status: Patient is a DO NOT RESUSCITATE   Goals of Care: Goals of care include to maximize quality of life and symptom management.  MOST selections include comfort measures, no antibiotics, no IV fluids, no feeding tube.  Guardian is open to hospice service in the future when patient is eligible.  Visit consisted of counseling and education dealing with the complex and emotionally intense issues of symptom management and palliative care in the setting of serious and potentially life-threatening illness. Palliative care team will continue to support patient, patient's family, and medical team.  Symptom management/Plan:  Liver cirrhosis: Managed with  rifaximin and Lactulose as ordered. Avoid nephrotoxic substances. Pruritus: Likely related to liver cirrhosis. Managed with Benadryl itch cream, Eucerine cream, hydrocortisone cream and hydroxyzine tab.  Depression: Continue to encourage to participate in facility activities. Patient interacts with staff and  residents;  Patient denies depressive mood.  Continue Paroxetine as ordered.  Psych consult as scheduled/needed. Follow up: Palliative care will continue to follow for complex medical decision making, advance care planning, and clarification of goals. Return 6 weeks or prn. Encouraged to call provider sooner with any concerns.  CHIEF COMPLAINT: Palliative follow up  HISTORY OF PRESENT ILLNESS:  Judy Meza a 61 y.o. female with multiple medical problems including  alcoholic liver cirrhosis, pruritis, COPD, viral hep C, CKD 3, hypertension, throat cancer.  She denies moodiness, pain/discomfort, no respiratory distress, no recent COPD exacerbation.  Nursing with no concerns at this time.  History obtained from review of EMR, discussion with primary team, family and/or patient. Records reviewed and summarized above. All 10 point systems reviewed and are negative except as documented in history of present illness above  Review and summarization of Epic records shows history from other than patient.   Palliative Care was asked to follow this patient o help address complex decision making in the context of advance care planning and goals of care clarification.   PHYSICAL EXAM  General: In no acute distress, resting in bed appropriately dressed Cardiovascular: regular rate and rhythm; no edema in BLE Pulmonary: no cough, no increased work of breathing, normal respiratory effort Abdomen: soft, non tender, no guarding, positive bowel sounds in all quadrants GU:  no suprapubic tenderness Eyes: Normal lids, no discharge ENMT: Moist mucous membranes Musculoskeletal:  weakness, ambulatory.  Skin: no rash to visible skin, warm without cyanosis,  Psych: non-anxious affect Neurological: Weakness but otherwise non focal Heme/lymph/immuno: no bruises, no bleeding  PERTINENT MEDICATIONS:  Outpatient Encounter Medications as of 01/17/2021  Medication Sig   acetaminophen (TYLENOL) 325 MG tablet Take 650 mg  by mouth  every 4 (four) hours as needed for mild pain or fever.   albuterol (VENTOLIN HFA) 108 (90 Base) MCG/ACT inhaler Inhale 2 puffs into the lungs every 6 (six) hours as needed for shortness of breath.   Carboxymethylcellul-Glycerin (REFRESH OPTIVE) 1-0.9 % GEL Place 1 drop into both eyes 4 (four) times daily.   diclofenac Sodium (VOLTAREN) 1 % GEL Apply 2 g topically 4 (four) times daily. Left ankle and leg   diphenhydrAMINE-zinc acetate (BENADRYL ITCH STOPPING) cream Apply 1 application topically every 4 (four) hours as needed for itching.   ergocalciferol (VITAMIN D2) 1.25 MG (50000 UT) capsule Take 50,000 Units by mouth every Friday.   famotidine (PEPCID) 20 MG tablet Take 20 mg by mouth daily.   folic acid (FOLVITE) 1 MG tablet Take 1 tablet (1 mg total) by mouth daily.   gabapentin (NEURONTIN) 100 MG capsule Take 100 mg by mouth 3 (three) times daily.   hydrocortisone 2.5 % cream Apply 1 application topically See admin instructions. Apply to back of neck every shift.   hydrOXYzine (ATARAX/VISTARIL) 25 MG tablet Take 25 mg by mouth every 6 (six) hours as needed for itching.   insulin aspart (NOVOLOG) 100 UNIT/ML injection Inject 5 Units into the skin 3 (three) times daily with meals. If eating >50% of meals   insulin aspart (NOVOLOG) 100 UNIT/ML injection Inject 0-15 Units into the skin 3 (three) times daily with meals.   insulin glargine-yfgn (SEMGLEE) 100 UNIT/ML injection Inject 0.18 mLs (18 Units total) into the skin daily.   ketotifen (ZADITOR) 0.025 % ophthalmic solution Place 1 drop into both eyes See admin instructions. Qd x 21 days   lactulose (CHRONULAC) 10 GM/15ML solution Take 30 g by mouth in the morning, at noon, in the evening, and at bedtime.   lidocaine (LIDODERM) 5 % Place 1 patch onto the skin daily. Remove & Discard patch within 12 hours or as directed by MD   loratadine (CLARITIN) 10 MG tablet Take 10 mg by mouth daily.   nadolol (CORGARD) 20 MG tablet Take 1 tablet  (20 mg total) by mouth daily.   Olopatadine HCl 0.2 % SOLN Place 1 drop into both eyes daily as needed (allergic conjunctivitis).   ondansetron (ZOFRAN ODT) 8 MG disintegrating tablet Take 1 tablet (8 mg total) by mouth every 8 (eight) hours as needed for nausea or vomiting.   PARoxetine (PAXIL) 40 MG tablet Take 40 mg by mouth daily.   rifaximin (XIFAXAN) 550 MG TABS tablet Take 1 tablet (550 mg total) by mouth 2 (two) times daily.   Skin Protectants, Misc. (EUCERIN) cream Apply 1 application topically every 12 (twelve) hours as needed for dry skin.   sodium bicarbonate 650 MG tablet Take 1 tablet (650 mg total) by mouth 2 (two) times daily.   thiamine 100 MG tablet Take 1 tablet (100 mg total) by mouth daily.   No facility-administered encounter medications on file as of 01/17/2021.    HOSPICE ELIGIBILITY/DIAGNOSIS: TBD  PAST MEDICAL HISTORY:  Past Medical History:  Diagnosis Date   Acute kidney failure (HCC)    Chronic hepatitis C ([facility])    Chronic kidney disease, stage 3 (HCC)    Cirrhosis (HCC)    COPD (chronic obstructive pulmonary disease) ([facility])    COVID-19 virus infection    Diabetes mellitus without complication (HCC)    O3Z 7.2% 09/17/20   GERD (gastroesophageal reflux disease)    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   I   Hypertension    Liver  disease    Normal pressure hydrocephalus (HCC)    Renal disorder    Sleep apnea    uses O2 at night   Thrombocytopenia (HCC)    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    ALLERGIES: No Known Allergies    I spent  45 minutes providing this consultation; this includes time spent with patient/family, chart review and documentation. More than 50% of the time in this consultation was spent on counseling and coordinating communication   Thank you for the opportunity to participate in the care of Judy Meza Please call our office at [phone] if we can be of additional assistance.  Note: Portions  of this note were generated with Lobbyist. Dictation errors may occur despite best attempts at proofreading.  Teodoro Spray, NP

## 2021-03-14 ENCOUNTER — Non-Acute Institutional Stay: Payer: Medicaid Other | Admitting: Hospice

## 2021-03-14 ENCOUNTER — Other Ambulatory Visit: Payer: Self-pay

## 2021-03-14 DIAGNOSIS — K7031 Alcoholic cirrhosis of liver with ascites: Secondary | ICD-10-CM

## 2021-03-14 DIAGNOSIS — L299 Pruritus, unspecified: Secondary | ICD-10-CM

## 2021-03-14 DIAGNOSIS — Z515 Encounter for palliative care: Secondary | ICD-10-CM

## 2021-03-14 DIAGNOSIS — E1022 Type 1 diabetes mellitus with diabetic chronic kidney disease: Secondary | ICD-10-CM

## 2021-03-14 DIAGNOSIS — F339 Major depressive disorder, recurrent, unspecified: Secondary | ICD-10-CM

## 2021-03-14 NOTE — Progress Notes (Signed)
[facility] Consult Note Telephone: [phone]  Fax: [phone]  PATIENT NAME: Judy Meza DOB: March 30, 1960 MRN: [id number]  PRIMARY CARE PROVIDER:   Caprice Renshaw, MD Caprice Renshaw, [place],  [place] [address]  REFERRING PROVIDER: Caprice Renshaw, MD Caprice Renshaw, [place],  [place] [address]   RESPONSIBLE PARTY: Judy Meza is guardian of person Contact Information     Name Relation Home Work Judy Meza Sister [phone]  [phone]   Judy, Meza [phone]  [phone]       Visit is to build trust and highlight Palliative Medicine as specialized medical care for people living with serious illness, aimed at facilitating better quality of life through symptoms relief, assisting with advance care planning and complex medical decision making. This is a follow up visit.  RECOMMENDATIONS/PLAN:    Advance Care Planning/Code Status: Patient is a DO NOT RESUSCITATE   Goals of Care: Goals of care include to maximize quality of life and symptom management.  MOST selections include comfort measures, no antibiotics, no IV fluids, no feeding tube.  Guardian is open to hospice service in the future when patient is eligible.  Visit consisted of counseling and education dealing with the complex and emotionally intense issues of symptom management and palliative care in the setting of serious and potentially life-threatening illness. Palliative care team will continue to support patient, patient's family, and medical team.  Symptom management/Plan:  Type 1 DM: Managed with Lantus and sliding scale humalog. Continue CBG monitoring as ordered. Monitor hemoglobin A1c every 3 months.  Liver cirrhosis: Continue rifaximin and Lactulose as ordered. Avoid nephrotoxic substances. Last Ammonia level Feb 2023 is 17 ug/dL.  Pruritus: Likely related to liver cirrhosis. Managed with Benadryl itch cream,  Eucerine cream, hydrocortisone cream and hydroxyzine tab. Follow up with Dermatologist as planned.  Depression: Continue to encourage to participate in facility activities. Patient interacts with staff and residents;  Patient denies depressive mood.  Continue Paroxetine as ordered.  Psych consult as scheduled/needed. Follow up: Palliative care will continue to follow for complex medical decision making, advance care planning, and clarification of goals. Return 6 weeks or prn. Encouraged to call provider sooner with any concerns.  CHIEF COMPLAINT: Palliative follow up  HISTORY OF PRESENT ILLNESS:  Judy Meza a 61 y.o. female with multiple medical problems including  Type 1 DM, alcoholic liver cirrhosis, pruritis, COPD, viral hep C, CKD 3, hypertension, throat cancer.  She denies hypoglycemic/hyperglycemic symptoms, moodiness, pain/discomfort, no respiratory distress, no recent COPD exacerbation.Marland Kitchen History obtained from review of EMR, discussion with primary team, family and/or patient. Records reviewed and summarized above. All 10 point systems reviewed and are negative except as documented in history of present illness above  Review and summarization of Epic records shows history from other than patient.   Palliative Care was asked to follow this patient o help address complex decision making in the context of advance care planning and goals of care clarification.   PHYSICAL EXAM  General: In no acute distress, resting in bed appropriately dressed Cardiovascular: regular rate and rhythm; no edema in BLE Pulmonary: no cough, no increased work of breathing, normal respiratory effort Abdomen: soft, non tender, no guarding, positive bowel sounds in all quadrants GU:  no suprapubic tenderness Eyes: Normal lids, no discharge ENMT: Moist mucous membranes Musculoskeletal:  weakness, ambulatory.  Skin: no rash to visible skin, warm without cyanosis,  Psych: non-anxious affect Neurological: Weakness  but otherwise non  focal Heme/lymph/immuno: no bruises, no bleeding  PERTINENT MEDICATIONS:  Outpatient Encounter Medications as of 03/14/2021  Medication Sig   acetaminophen (TYLENOL) 325 MG tablet Take 650 mg by mouth every 4 (four) hours as needed for mild pain or fever.   albuterol (VENTOLIN HFA) 108 (90 Base) MCG/ACT inhaler Inhale 2 puffs into the lungs every 6 (six) hours as needed for shortness of breath.   Carboxymethylcellul-Glycerin (REFRESH OPTIVE) 1-0.9 % GEL Place 1 drop into both eyes 4 (four) times daily.   diclofenac Sodium (VOLTAREN) 1 % GEL Apply 2 g topically 4 (four) times daily. Left ankle and leg   diphenhydrAMINE-zinc acetate (BENADRYL ITCH STOPPING) cream Apply 1 application topically every 4 (four) hours as needed for itching.   ergocalciferol (VITAMIN D2) 1.25 MG (50000 UT) capsule Take 50,000 Units by mouth every Friday.   famotidine (PEPCID) 20 MG tablet Take 20 mg by mouth daily.   folic acid (FOLVITE) 1 MG tablet Take 1 tablet (1 mg total) by mouth daily.   gabapentin (NEURONTIN) 100 MG capsule Take 100 mg by mouth 3 (three) times daily.   hydrocortisone 2.5 % cream Apply 1 application topically See admin instructions. Apply to back of neck every shift.   hydrOXYzine (ATARAX/VISTARIL) 25 MG tablet Take 25 mg by mouth every 6 (six) hours as needed for itching.   insulin aspart (NOVOLOG) 100 UNIT/ML injection Inject 5 Units into the skin 3 (three) times daily with meals. If eating >50% of meals   insulin aspart (NOVOLOG) 100 UNIT/ML injection Inject 0-15 Units into the skin 3 (three) times daily with meals.   insulin glargine-yfgn (SEMGLEE) 100 UNIT/ML injection Inject 0.18 mLs (18 Units total) into the skin daily.   ketotifen (ZADITOR) 0.025 % ophthalmic solution Place 1 drop into both eyes See admin instructions. Qd x 21 days   lactulose (CHRONULAC) 10 GM/15ML solution Take 30 g by mouth in the morning, at noon, in the evening, and at bedtime.   lidocaine (LIDODERM)  5 % Place 1 patch onto the skin daily. Remove & Discard patch within 12 hours or as directed by MD   loratadine (CLARITIN) 10 MG tablet Take 10 mg by mouth daily.   nadolol (CORGARD) 20 MG tablet Take 1 tablet (20 mg total) by mouth daily.   Olopatadine HCl 0.2 % SOLN Place 1 drop into both eyes daily as needed (allergic conjunctivitis).   ondansetron (ZOFRAN ODT) 8 MG disintegrating tablet Take 1 tablet (8 mg total) by mouth every 8 (eight) hours as needed for nausea or vomiting.   PARoxetine (PAXIL) 40 MG tablet Take 40 mg by mouth daily.   rifaximin (XIFAXAN) 550 MG TABS tablet Take 1 tablet (550 mg total) by mouth 2 (two) times daily.   Skin Protectants, Misc. (EUCERIN) cream Apply 1 application topically every 12 (twelve) hours as needed for dry skin.   sodium bicarbonate 650 MG tablet Take 1 tablet (650 mg total) by mouth 2 (two) times daily.   thiamine 100 MG tablet Take 1 tablet (100 mg total) by mouth daily.   No facility-administered encounter medications on file as of 03/14/2021.    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 More than 50% of the time in this consultation was spent on  counseling and coordinating communication   Thank you for the opportunity to participate in the care of SIMRIT GOHLKE Please call our office at [phone] if we can be of additional assistance.  Note: Portions of this note were generated with Lobbyist. Dictation errors may occur despite best attempts at proofreading.  Teodoro Spray, NP

## 2021-07-04 ENCOUNTER — Non-Acute Institutional Stay: Payer: Medicaid Other | Admitting: Hospice

## 2021-07-04 DIAGNOSIS — Z515 Encounter for palliative care: Secondary | ICD-10-CM

## 2021-07-04 DIAGNOSIS — E1022 Type 1 diabetes mellitus with diabetic chronic kidney disease: Secondary | ICD-10-CM

## 2021-07-04 DIAGNOSIS — K7031 Alcoholic cirrhosis of liver with ascites: Secondary | ICD-10-CM

## 2021-07-04 DIAGNOSIS — F339 Major depressive disorder, recurrent, unspecified: Secondary | ICD-10-CM

## 2021-07-04 DIAGNOSIS — L299 Pruritus, unspecified: Secondary | ICD-10-CM

## 2021-08-11 ENCOUNTER — Other Ambulatory Visit: Payer: Self-pay | Admitting: Otolaryngology

## 2021-08-11 ENCOUNTER — Other Ambulatory Visit (HOSPITAL_COMMUNITY): Payer: Self-pay | Admitting: Otolaryngology

## 2021-08-11 DIAGNOSIS — C329 Malignant neoplasm of larynx, unspecified: Secondary | ICD-10-CM

## 2021-08-12 ENCOUNTER — Telehealth: Payer: Self-pay | Admitting: Gastroenterology

## 2021-08-12 NOTE — Telephone Encounter (Signed)
Inbound call from Amy at [facility] wanting to schedule patient for a colonoscopy. I was not sure if she needed to be seen in the office since she was last seen a year ago or if she could be scheduled as direct? Amy is requesting a call back to be advised on how to move forward. Please advise.

## 2021-08-12 NOTE — Telephone Encounter (Signed)
Spoke with Hanley Ben from [facility]: Pt Chart reviewed: Pt was scheduled for an office visit with Dr. Bryan Lemma on 09/08/2021 at 8:20 AM: Amy made aware: Amy verbalized understanding with all questions answered.

## 2021-08-15 ENCOUNTER — Ambulatory Visit: Payer: Medicaid Other | Admitting: Internal Medicine

## 2021-08-20 ENCOUNTER — Other Ambulatory Visit (HOSPITAL_COMMUNITY): Payer: Self-pay | Admitting: Otolaryngology

## 2021-08-20 ENCOUNTER — Ambulatory Visit (HOSPITAL_COMMUNITY)
Admission: RE | Admit: 2021-08-20 | Discharge: 2021-08-20 | Disposition: A | Discharge status: Home / Self Care | Payer: Medicaid Other | Source: Ambulatory Visit | Attending: Otolaryngology | Admitting: Otolaryngology

## 2021-08-20 DIAGNOSIS — C329 Malignant neoplasm of larynx, unspecified: Secondary | ICD-10-CM

## 2021-09-03 ENCOUNTER — Encounter: Payer: Self-pay | Admitting: Internal Medicine

## 2021-09-03 ENCOUNTER — Other Ambulatory Visit: Payer: Self-pay

## 2021-09-03 ENCOUNTER — Ambulatory Visit (INDEPENDENT_AMBULATORY_CARE_PROVIDER_SITE_OTHER): Payer: Medicaid Other | Admitting: Internal Medicine

## 2021-09-03 VITALS — BP 142/88 | HR 86 | Temp 97.7°F

## 2021-09-03 DIAGNOSIS — B182 Chronic viral hepatitis C: Secondary | ICD-10-CM

## 2021-09-03 DIAGNOSIS — K7031 Alcoholic cirrhosis of liver with ascites: Secondary | ICD-10-CM

## 2021-09-03 DIAGNOSIS — K7030 Alcoholic cirrhosis of liver without ascites: Secondary | ICD-10-CM

## 2021-09-03 DIAGNOSIS — Z129 Encounter for screening for malignant neoplasm, site unspecified: Secondary | ICD-10-CM | POA: Diagnosis not present

## 2021-09-03 DIAGNOSIS — D696 Thrombocytopenia, unspecified: Secondary | ICD-10-CM | POA: Diagnosis not present

## 2021-09-03 NOTE — Assessment & Plan Note (Signed)
She is followed by GI.  I will schedule her for [facility] screening with ultrasound now and every 6 months.   She will follow up in 1 year.

## 2021-09-03 NOTE — Progress Notes (Signed)
   Subjective:    Patient ID: Judy Meza, female    DOB: 11/18/1960, 61 y.o.   MRN: [id number]  HPI Here for follow up of chronic hepatitis C and cirrhosis.  She was last seen by me in 2021 for chronic hepatitis C and decompensated cirrhosis and had completed 6 months of Epclusa.  She has a history of laryngeal cancer and followed by palliative medicine as well.  She has no complaints today.  Also followed by GI.     Review of Systems  Gastrointestinal:  Negative for diarrhea.  Skin:  Negative for rash.       Objective:   Physical Exam Eyes:     General: No scleral icterus. Pulmonary:     Effort: Pulmonary effort is normal.  Neurological:     Mental Status: She is alert.  Psychiatric:        Mood and Affect: Mood normal.   SH: no alcohol        Assessment & Plan:

## 2021-09-03 NOTE — Assessment & Plan Note (Signed)
Chronic and largely related to her underlying cirrhosis.

## 2021-09-03 NOTE — Assessment & Plan Note (Signed)
Has not had SVR 24 so will check today.

## 2021-09-06 LAB — HEPATIC FUNCTION PANEL
AG Ratio: 0.8 (calc) — ABNORMAL LOW (ref 1.0–2.5)
ALT: 27 U/L (ref 6–29)
AST: 53 U/L — ABNORMAL HIGH (ref 10–35)
Albumin: 2.9 g/dL — ABNORMAL LOW (ref 3.6–5.1)
Alkaline phosphatase (APISO): 102 U/L (ref 37–153)
Bilirubin, Direct: 0.6 mg/dL — ABNORMAL HIGH (ref 0.0–0.2)
Globulin: 3.5 g/dL (calc) (ref 1.9–3.7)
Indirect Bilirubin: 1.5 mg/dL (calc) — ABNORMAL HIGH (ref 0.2–1.2)
Total Bilirubin: 2.1 mg/dL — ABNORMAL HIGH (ref 0.2–1.2)
Total Protein: 6.4 g/dL (ref 6.1–8.1)

## 2021-09-06 LAB — HCV RNA, QUANT REAL-TIME PCR W/REFLEX
HCV RNA, PCR, QN (Log): <1.18 LogIU/mL
HCV RNA, PCR, QN: <15 IU/mL

## 2021-09-08 ENCOUNTER — Encounter: Payer: Self-pay | Admitting: Gastroenterology

## 2021-09-08 ENCOUNTER — Ambulatory Visit (INDEPENDENT_AMBULATORY_CARE_PROVIDER_SITE_OTHER): Payer: Medicaid Other | Admitting: Gastroenterology

## 2021-09-08 VITALS — BP 140/92 | HR 99 | Ht 61.0 in | Wt 146.0 lb

## 2021-09-08 DIAGNOSIS — Z8619 Personal history of other infectious and parasitic diseases: Secondary | ICD-10-CM | POA: Diagnosis not present

## 2021-09-08 DIAGNOSIS — K7682 Hepatic encephalopathy: Secondary | ICD-10-CM

## 2021-09-08 DIAGNOSIS — I8510 Secondary esophageal varices without bleeding: Secondary | ICD-10-CM

## 2021-09-08 DIAGNOSIS — B1920 Unspecified viral hepatitis C without hepatic coma: Secondary | ICD-10-CM

## 2021-09-08 DIAGNOSIS — Z5320 Procedure and treatment not carried out because of patient's decision for unspecified reasons: Secondary | ICD-10-CM

## 2021-09-08 DIAGNOSIS — K7031 Alcoholic cirrhosis of liver with ascites: Secondary | ICD-10-CM

## 2021-09-08 DIAGNOSIS — K7469 Other cirrhosis of liver: Secondary | ICD-10-CM

## 2021-09-08 NOTE — Patient Instructions (Addendum)
_______________________________________________________ If you are age 59 or younger, your body mass index should be between 19-25. Your Body mass index is 27.59 kg/m. If this is out of the aformentioned range listed, please consider follow up with your Primary Care Provider.   ________________________________________________________  The [facility] providers would like to encourage you to use [facility] to communicate with providers for non-urgent requests or questions.  Due to long hold times on the telephone, sending your provider a message by [facility] may be a faster and more efficient way to get a response.  Please allow 48 business hours for a response.  Please remember that this is for non-urgent requests.  _______________________________________________________  Due to recent changes in healthcare laws, you may see the results of your imaging and laboratory studies on MyChart before your provider has had a chance to review them.  We understand that in some cases there may be results that are confusing or concerning to you. Not all laboratory results come back in the same time frame and the provider may be waiting for multiple results in order to interpret others.  Please give [place] 48 hours in order for your provider to thoroughly review all the results before contacting the office for clarification of your results.    Follow up as needed.  Thank you for choosing me and [facility].  Vito Cirigliano, D.O.
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
## 2021-09-10 ENCOUNTER — Ambulatory Visit
Admission: RE | Admit: 2021-09-10 | Discharge: 2021-09-10 | Disposition: A | Discharge status: Home / Self Care | Payer: Medicaid Other | Source: Ambulatory Visit | Attending: Internal Medicine | Admitting: Internal Medicine

## 2021-09-10 DIAGNOSIS — B182 Chronic viral hepatitis C: Secondary | ICD-10-CM

## 2021-09-10 DIAGNOSIS — Z129 Encounter for screening for malignant neoplasm, site unspecified: Secondary | ICD-10-CM

## 2021-10-13 IMAGING — US US ABDOMEN LIMITED
1 series · 14 of 25 positions shown · non-contrast
Comparison: 10/03/2019

CLINICAL DATA: Cirrhosis

EXAM:
ULTRASOUND ABDOMEN LIMITED RIGHT UPPER QUADRANT

[Series 1: us abdomen limited · 0.22mm/px · 14 of 53 slices shown]
[im 1/53]
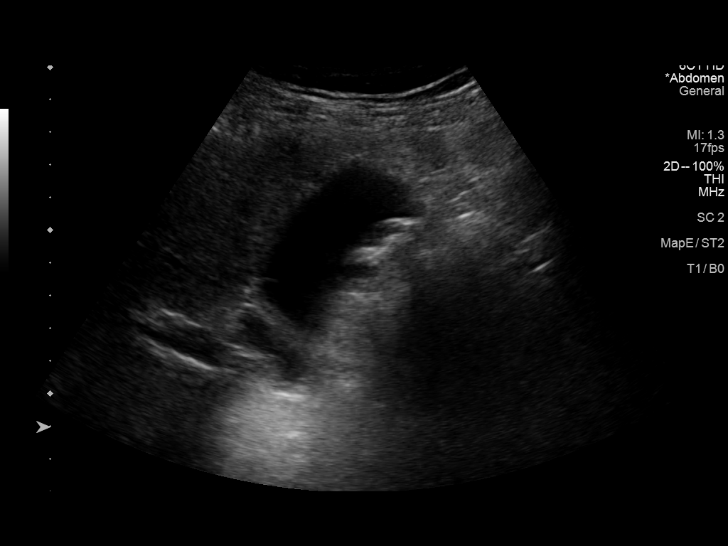
[im 5/53]
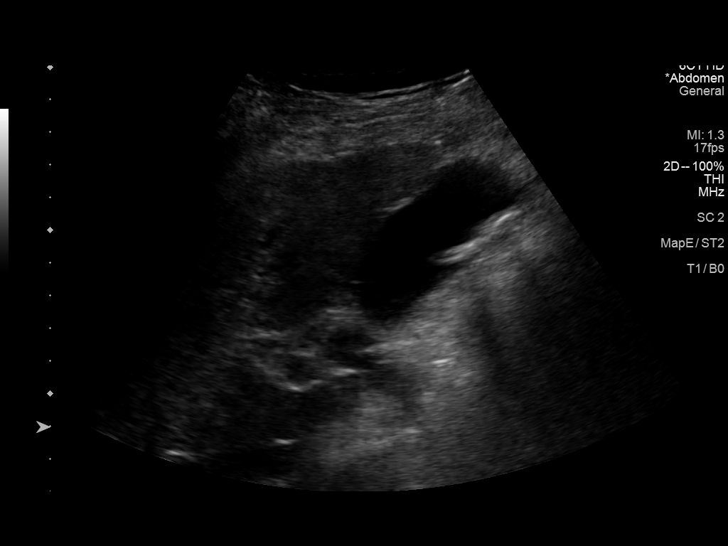
[im 9/53]
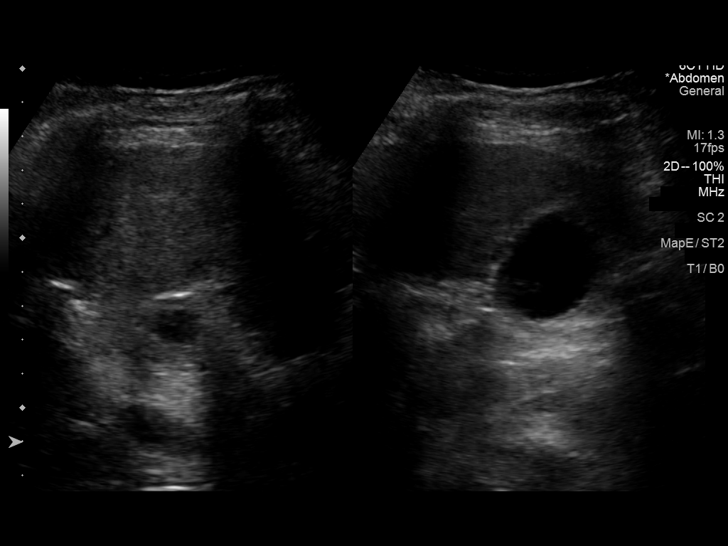
[im 14/53]
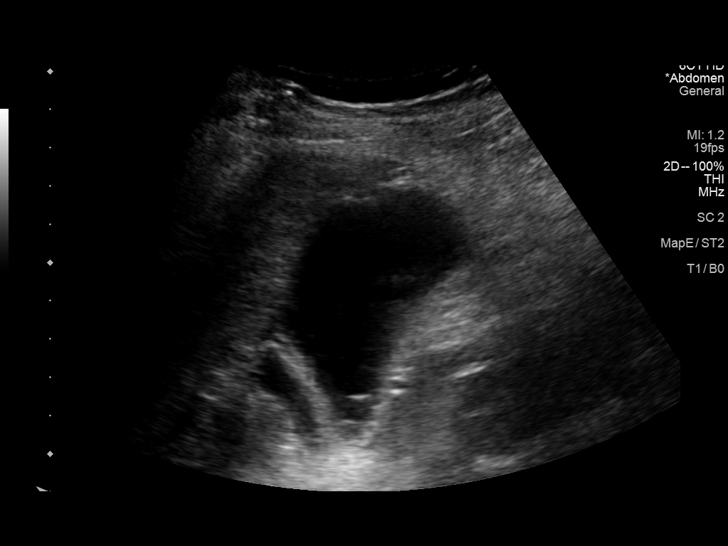
[im 18/53]
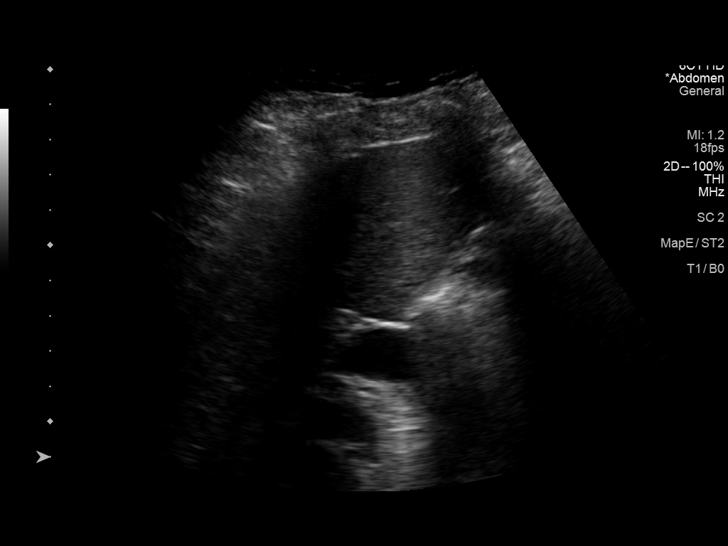
[im 20/53]
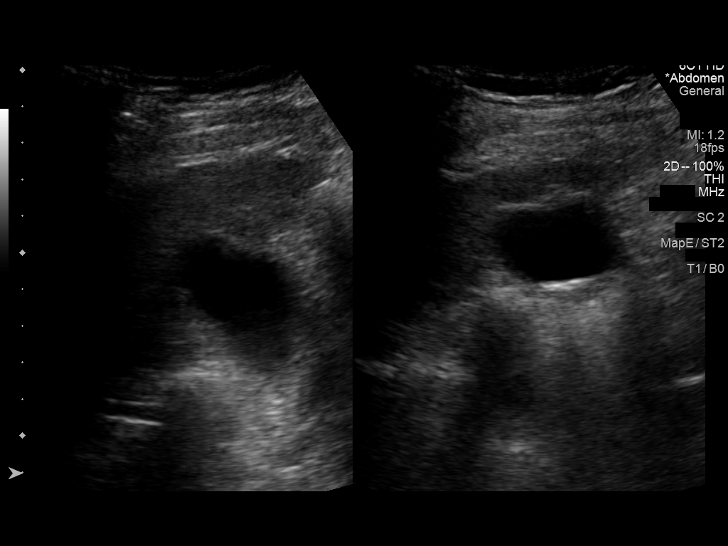
[im 24/53]
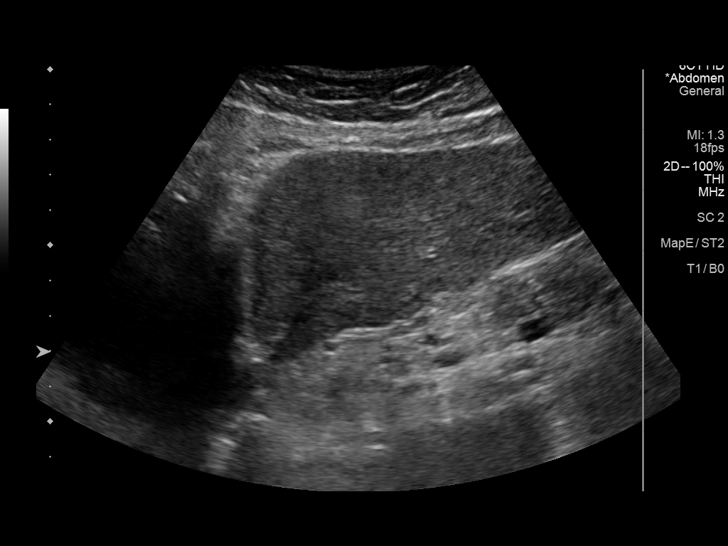
[im 29/53]
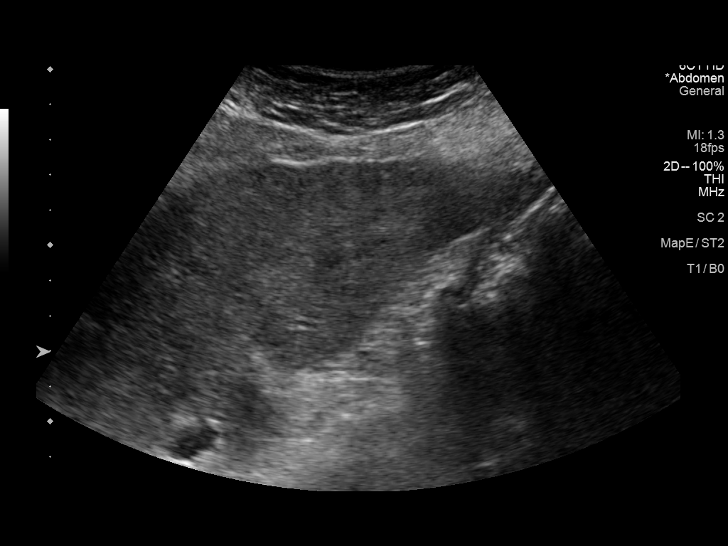
[im 33/53]
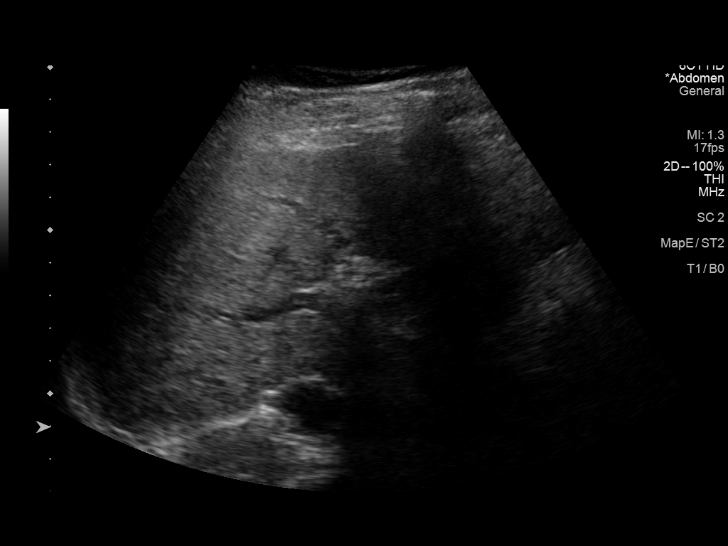
[im 35/53]
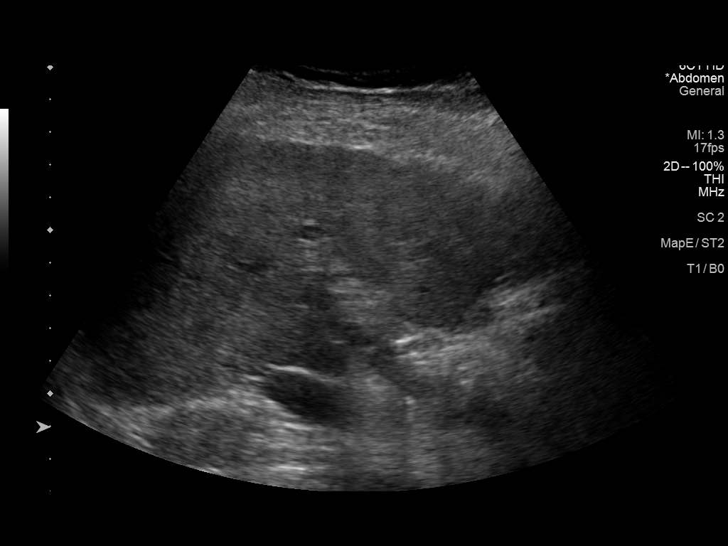
[im 40/53]
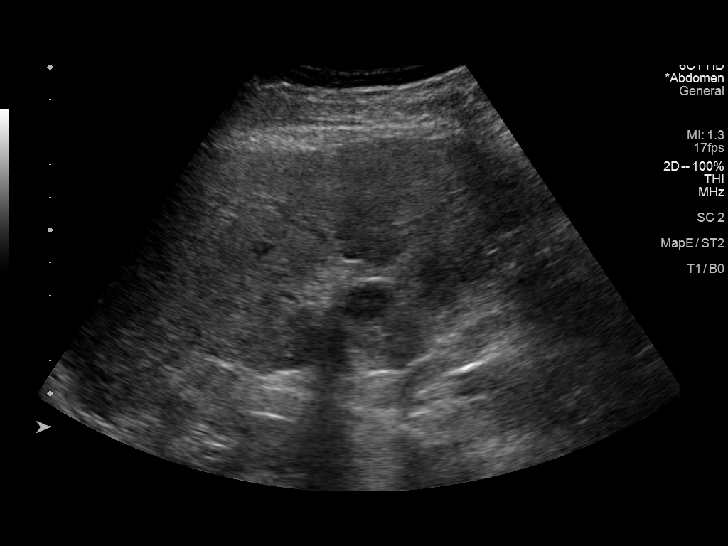
[im 44/53]
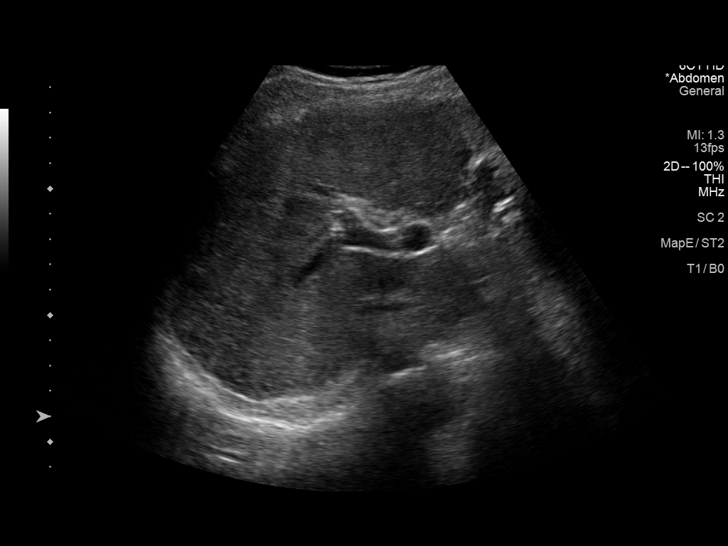
[im 48/53]
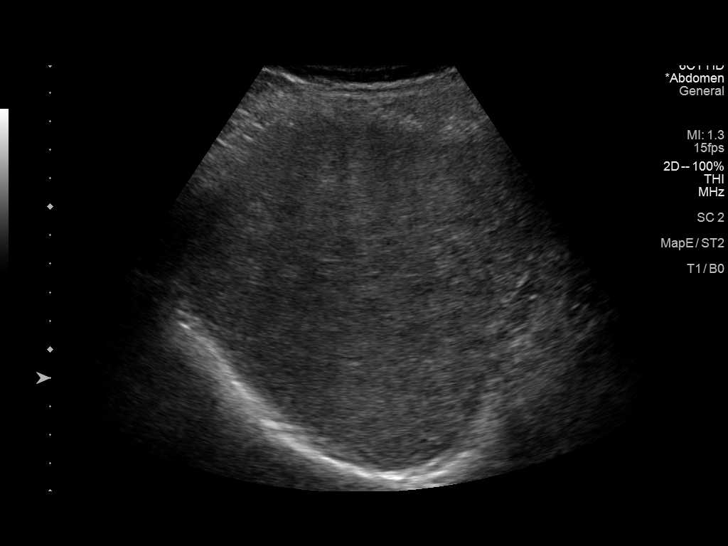
[im 53/53]
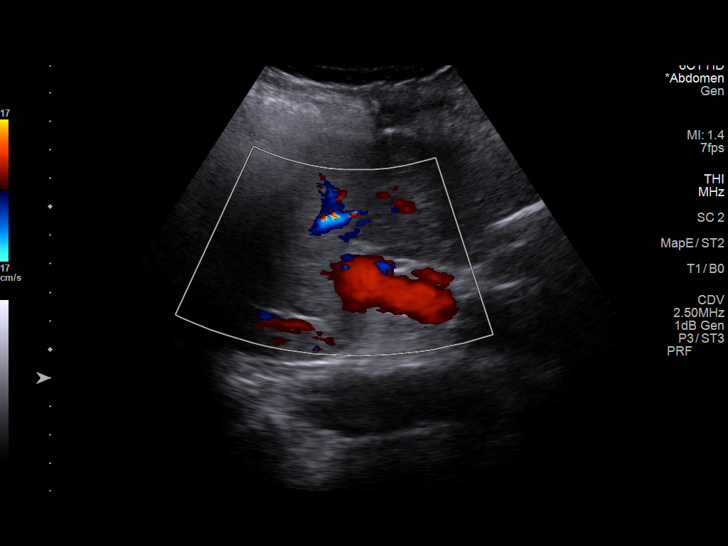

[14 of 25 positions shown; findings below may reference images not displayed]

FINDINGS: Gallbladder:

Multiple mobile shadowing gallstones are identified, measuring up to
12 mm in size. No gallbladder wall thickening or pericholecystic
fluid. Negative Murphy sign.

Common bile duct:

Diameter: 5 mm

Liver:

Coarsened heterogeneous liver echotexture most consistent with
history of cirrhosis. Stable nodularity of the liver capsule. No
focal parenchymal abnormalities. No intrahepatic duct dilation.
Portal vein is patent on color Doppler imaging with normal direction
of blood flow towards the liver.

Other: None.
IMPRESSION: 1. Stable cirrhosis.  No focal parenchymal liver abnormality.
2. Cholelithiasis without cholecystitis.

## 2021-10-15 ENCOUNTER — Encounter: Payer: Medicaid Other | Admitting: Obstetrics and Gynecology

## 2021-11-25 ENCOUNTER — Other Ambulatory Visit (HOSPITAL_COMMUNITY)
Admission: RE | Admit: 2021-11-25 | Discharge: 2021-11-25 | Disposition: A | Discharge status: Home / Self Care | Payer: Medicaid Other | Source: Ambulatory Visit | Attending: Obstetrics and Gynecology | Admitting: Obstetrics and Gynecology

## 2021-11-25 ENCOUNTER — Ambulatory Visit (INDEPENDENT_AMBULATORY_CARE_PROVIDER_SITE_OTHER): Payer: Medicaid Other | Admitting: Obstetrics

## 2021-11-25 ENCOUNTER — Encounter: Payer: Self-pay | Admitting: Obstetrics

## 2021-11-25 VITALS — BP 141/84 | HR 81 | Ht 62.0 in | Wt 140.0 lb

## 2021-11-25 DIAGNOSIS — Z01419 Encounter for gynecological examination (general) (routine) without abnormal findings: Secondary | ICD-10-CM | POA: Insufficient documentation

## 2021-11-25 DIAGNOSIS — Z0000 Encounter for general adult medical examination without abnormal findings: Secondary | ICD-10-CM | POA: Diagnosis not present

## 2021-11-25 DIAGNOSIS — Z720 Tobacco use: Secondary | ICD-10-CM

## 2021-11-25 DIAGNOSIS — C329 Malignant neoplasm of larynx, unspecified: Secondary | ICD-10-CM

## 2021-11-25 DIAGNOSIS — N898 Other specified noninflammatory disorders of vagina: Secondary | ICD-10-CM | POA: Diagnosis present

## 2021-11-25 DIAGNOSIS — Z780 Asymptomatic menopausal state: Secondary | ICD-10-CM | POA: Diagnosis not present

## 2021-11-25 NOTE — Progress Notes (Signed)
61 y.o. New GYN presents for AEX/PAP.  Pt stated that she had a Mammogram this year in October.

## 2021-11-25 NOTE — Progress Notes (Signed)
Subjective:        Judy Meza is a 61 y.o. female here for a routine exam.  Current complaints: Vaginal discharge.    Personal health questionnaire:  Is patient Ashkenazi Jewish, have a family history of breast and/or ovarian cancer: no Is there a family history of uterine cancer diagnosed at age < 33, gastrointestinal cancer, urinary tract cancer, family member who is a Field seismologist syndrome-associated carrier: no Is the patient overweight and hypertensive, family history of diabetes, personal history of gestational diabetes, preeclampsia or PCOS: no Is patient over 38, have PCOS,  family history of premature CHD under age 72, diabetes, smoke, have hypertension or peripheral artery disease:  no At any time, has a partner hit, kicked or otherwise hurt or frightened you?: no Over the past 2 weeks, have you felt down, depressed or hopeless?: no Over the past 2 weeks, have you felt little interest or pleasure in doing things?:no   Gynecologic History No LMP recorded. Patient is postmenopausal. Contraception: post menopausal status Last Pap: unknown. Results were: normal Last mammogram: 2023. Results were: normal  Obstetric History OB History  Gravida Para Term Preterm AB Living  '3 1     2 1  '$ SAB IAB Ectopic Multiple Live Births               # Outcome Date GA Lbr Len/2nd Weight Sex Delivery Anes PTL Lv  3 AB           2 AB           1 Para             Past Medical History:  Diagnosis Date   Acute kidney failure (HCC)    Anemia    Anxiety    Arthritis    Asthma    Blood transfusion without reported diagnosis    Chronic hepatitis C ([place])    Chronic kidney disease, stage 3 (HCC)    Cirrhosis ([place])    COPD (chronic obstructive pulmonary disease) ([place])    COVID-19 virus infection    Depression    Diabetes mellitus without complication (HCC)    Z6X 7.2% 09/17/20   GERD (gastroesophageal reflux disease)    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   I   Hypertension     Liver disease    Normal pressure hydrocephalus (HCC)    Pneumonia    Renal disorder    Sleep apnea    uses O2 at night   Thrombocytopenia (HCC)    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   Vaginal Pap smear, abnormal     Past Surgical History:  Procedure Laterality Date   ESOPHAGEAL BANDING  10/04/2019   Procedure: ESOPHAGEAL BANDING;  Surgeon: Lavena Bullion, DO;  Location: [facility];  Service: Gastroenterology;;   ESOPHAGOGASTRODUODENOSCOPY (EGD) WITH PROPOFOL N/A 10/04/2019   Procedure: ESOPHAGOGASTRODUODENOSCOPY (EGD) WITH PROPOFOL;  Surgeon: Lavena Bullion, DO;  Location: [facility];  Service: Gastroenterology;  Laterality: N/A;   HOT HEMOSTASIS N/A 10/04/2019   Procedure: HOT HEMOSTASIS (ARGON PLASMA COAGULATION/BICAP);  Surgeon: Lavena Bullion, DO;  Location[facility] ENDOSCOPY;  Service: Gastroenterology;  Laterality: N/A;   PORT-A-CATH REMOVAL     SKIN GRAFT     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/2020   Procedure: REPAIR OF TRACHEOCUTANEOIS FISTULA;  Surgeon: Izora Gala, MD;  Location: [facility];  Service: ENT;  Laterality: N/A;     Current Outpatient Medications:    acetaminophen (TYLENOL) 325 MG tablet,  Take 650 mg by mouth every 4 (four) hours as needed for mild pain or fever., Disp: , Rfl:    albuterol (VENTOLIN HFA) 108 (90 Base) MCG/ACT inhaler, Inhale 2 puffs into the lungs every 6 (six) hours as needed for shortness of breath., Disp: , Rfl:    Carboxymethylcellul-Glycerin (REFRESH OPTIVE) 1-0.9 % GEL, Place 1 drop into both eyes 4 (four) times daily., Disp: , Rfl:    diclofenac Sodium (VOLTAREN) 1 % GEL, Apply 2 g topically 4 (four) times daily. Left ankle and leg, Disp: , Rfl:    diphenhydrAMINE-zinc acetate (BENADRYL ITCH STOPPING) cream, Apply 1 application topically every 4 (four) hours as needed for itching., Disp: , Rfl:    ergocalciferol (VITAMIN D2) 1.25 MG (50000 UT) capsule, Take 50,000 Units by mouth every Friday.,  Disp: , Rfl:    famotidine (PEPCID) 20 MG tablet, Take 20 mg by mouth daily., Disp: , Rfl:    folic acid (FOLVITE) 1 MG tablet, Take 1 tablet (1 mg total) by mouth daily., Disp: 30 tablet, Rfl: 0   gabapentin (NEURONTIN) 100 MG capsule, Take 100 mg by mouth 3 (three) times daily., Disp: , Rfl:    hydrocortisone 2.5 % cream, Apply 1 application topically See admin instructions. Apply to back of neck every shift., Disp: , Rfl:    hydrOXYzine (ATARAX/VISTARIL) 25 MG tablet, Take 25 mg by mouth every 6 (six) hours as needed for itching., Disp: , Rfl:    insulin aspart (NOVOLOG) 100 UNIT/ML injection, Inject 5 Units into the skin 3 (three) times daily with meals. If eating >50% of meals, Disp: 10 mL, Rfl: 11   insulin aspart (NOVOLOG) 100 UNIT/ML injection, Inject 0-15 Units into the skin 3 (three) times daily with meals., Disp: 10 mL, Rfl: 11   insulin glargine-yfgn (SEMGLEE) 100 UNIT/ML injection, Inject 0.18 mLs (18 Units total) into the skin daily., Disp: 10 mL, Rfl: 11   ketotifen (ZADITOR) 0.025 % ophthalmic solution, Place 1 drop into both eyes See admin instructions. Qd x 21 days, Disp: , Rfl:    lactulose (CHRONULAC) 10 GM/15ML solution, Take 30 g by mouth in the morning, at noon, in the evening, and at bedtime., Disp: , Rfl:    lidocaine (LIDODERM) 5 %, Place 1 patch onto the skin daily. Remove & Discard patch within 12 hours or as directed by MD, Disp: 30 patch, Rfl: 0   loratadine (CLARITIN) 10 MG tablet, Take 10 mg by mouth daily., Disp: , Rfl:    nadolol (CORGARD) 20 MG tablet, Take 1 tablet (20 mg total) by mouth daily., Disp: , Rfl:    Olopatadine HCl 0.2 % SOLN, Place 1 drop into both eyes daily as needed (allergic conjunctivitis)., Disp: , Rfl:    ondansetron (ZOFRAN ODT) 8 MG disintegrating tablet, Take 1 tablet (8 mg total) by mouth every 8 (eight) hours as needed for nausea or vomiting., Disp: 20 tablet, Rfl: 0   ondansetron (ZOFRAN) 8 MG tablet, Take by mouth every 8 (eight) hours as  needed for nausea or vomiting., Disp: , Rfl:    OxyCODONE HCl, Abuse Deter, (OXAYDO) 5 MG TABA, Take 5 mg by mouth every 6 (six) hours as needed., Disp: , Rfl:    PARoxetine (PAXIL) 40 MG tablet, Take 40 mg by mouth daily., Disp: , Rfl:    potassium chloride SA (KLOR-CON M) 20 MEQ tablet, Take 20 mEq by mouth daily., Disp: , Rfl:    rifaximin (XIFAXAN) 550 MG TABS tablet, Take 1 tablet (550 mg  total) by mouth 2 (two) times daily., Disp: 180 tablet, Rfl: 5   Skin Protectants, Misc. (EUCERIN) cream, Apply 1 application topically every 12 (twelve) hours as needed for dry skin., Disp: , Rfl:    sodium bicarbonate 650 MG tablet, Take 1 tablet (650 mg total) by mouth 2 (two) times daily., Disp: 30 tablet, Rfl: 0   thiamine 100 MG tablet, Take 1 tablet (100 mg total) by mouth daily., Disp:  , Rfl:  No Known Allergies  Social History   Tobacco Use   Smoking status: Every Day    Packs/day: 0.25    Types: Cigarettes   Smokeless tobacco: Never   Tobacco comments:    3 per day  Substance Use Topics   Alcohol use: Not Currently    Family History  Problem Relation Age of Onset   Cancer Mother    Healthy Mother    Cancer Maternal Uncle    Cancer Maternal Grandmother    Hypertension Other       Review of Systems  Constitutional: negative for fatigue and weight loss Respiratory: negative for cough and wheezing Cardiovascular: negative for chest pain, fatigue and palpitations Gastrointestinal: negative for abdominal pain and change in bowel habits Musculoskeletal:negative for myalgias Neurological: negative for gait problems and tremors Behavioral/Psych: negative for abusive relationship, depression Endocrine: negative for temperature intolerance    Genitourinary:negative for abnormal menstrual periods, genital lesions, hot flashes, sexual problems and vaginal discharge Integument/breast: negative for breast lump, breast tenderness, nipple discharge and skin lesion(s)    Objective:        BP (!) 141/84   Pulse 81   Ht '5\' 2"'$  (1.575 m)   Wt 140 lb (63.5 kg)   BMI 25.61 kg/m  General:   alert  Skin:   no rash or abnormalities  Lungs:   clear to auscultation bilaterally  Heart:   regular rate and rhythm, S1, S2 normal, no murmur, click, rub or gallop  Breasts:   normal without suspicious masses, skin or nipple changes or axillary nodes  Abdomen:  normal findings: no organomegaly, soft, non-tender and no hernia  Pelvis:  External genitalia: normal general appearance Urinary system: urethral meatus normal and bladder without fullness, nontender Vaginal: normal without tenderness, induration or masses Cervix: normal appearance Adnexa: normal bimanual exam Uterus: anteverted and non-tender, normal size   Lab Review Urine pregnancy test Labs reviewed yes Radiologic studies reviewed yes  I have spent a total of 20 minutes of face-to-face time, excluding clinical staff time, reviewing notes and preparing to see patient, ordering tests and/or medications, and counseling the patient.   Assessment:    1. Encounter for routine gynecological examination with Papanicolaou smear of cervix Rx: - Cytology - PAP( [place])  2. Postmenopausal - stable  3. Vaginal discharge Rx: - Cervicovaginal ancillary only( [place])  4. Tobacco abuse  5. 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 Follow up in: 1 year.     Shelly Bombard, MD 11/25/2021 11:55 AM

## 2021-11-26 LAB — CERVICOVAGINAL ANCILLARY ONLY
Bacterial Vaginitis (gardnerella): NEGATIVE
Candida Glabrata: POSITIVE — AB
Candida Vaginitis: NEGATIVE
Chlamydia: NEGATIVE
Comment: NEGATIVE
Comment: NEGATIVE
Comment: NEGATIVE
Comment: NEGATIVE
Comment: NEGATIVE
Comment: NORMAL
Neisseria Gonorrhea: NEGATIVE
Trichomonas: NEGATIVE

## 2021-11-27 ENCOUNTER — Other Ambulatory Visit: Payer: Self-pay | Admitting: Obstetrics

## 2021-11-27 DIAGNOSIS — B379 Candidiasis, unspecified: Secondary | ICD-10-CM

## 2021-11-27 MED ORDER — AZO BORIC ACID 600 MG VA SUPP: 1.0000 | Freq: Every day | VAGINAL | 0 refills | Status: AC

## 2021-11-27 MED ORDER — AZO BORIC ACID 600 MG VA SUPP: 1.0000 | Freq: Every day | VAGINAL | 0 refills | Status: DC

## 2021-12-01 ENCOUNTER — Telehealth: Payer: Self-pay

## 2021-12-01 LAB — CYTOLOGY - PAP
Comment: NEGATIVE
Diagnosis: NEGATIVE
High risk HPV: NEGATIVE

## 2021-12-01 NOTE — Telephone Encounter (Signed)
S/w authorized Ivin Poot, and advised of results and rx sent to pharmacy.

## 2021-12-05 ENCOUNTER — Non-Acute Institutional Stay: Payer: Medicaid Other | Admitting: Hospice

## 2021-12-05 DIAGNOSIS — E1022 Type 1 diabetes mellitus with diabetic chronic kidney disease: Secondary | ICD-10-CM

## 2021-12-05 DIAGNOSIS — F339 Major depressive disorder, recurrent, unspecified: Secondary | ICD-10-CM

## 2021-12-05 DIAGNOSIS — Z515 Encounter for palliative care: Secondary | ICD-10-CM

## 2021-12-05 DIAGNOSIS — K7031 Alcoholic cirrhosis of liver with ascites: Secondary | ICD-10-CM

## 2021-12-05 NOTE — Progress Notes (Signed)
[facility] Consult Note Telephone: [phone]  Fax: [phone]  PATIENT NAME: Judy Meza DOB: 11-27-1960 MRN: [id number]  PRIMARY CARE PROVIDER:   Caprice Renshaw, MD Caprice Renshaw, [place],  [place] [address]  REFERRING PROVIDER: Caprice Renshaw, MD Caprice Renshaw, [place],  [place] [address]   RESPONSIBLE PARTY: Judy Meza is guardian of person Contact Information     Name Relation Home Work [place] Sister [phone]  [phone]   Judy Meza [phone]  [phone]       Visit is to build trust and highlight Palliative Medicine as specialized medical care for people living with serious illness, aimed at facilitating better quality of life through symptoms relief, assisting with advance care planning and complex medical decision making. This is a follow up visit.  RECOMMENDATIONS/PLAN:    Advance Care Planning/Code Status: Patient is a DO NOT RESUSCITATE   Goals of Care: Goals of care include to maximize quality of life and symptom management.  MOST selections include comfort measures, no antibiotics, no IV fluids, no feeding tube.  Guardian is open to hospice service in the future when patient is eligible.  Palliative care team will continue to support patient, patient's family, and medical team.  Symptom management/Plan:  Type 1 DM: Controlled. Current A1c 5.18 Aug 2021. Continue Lantus and sliding scale humalog. Continue CBG monitoring as ordered. Monitor hemoglobin A1c every 3 months.  Liver cirrhosis: Continue rifaximin and Lactulose as ordered. Avoid nephrotoxic substances. Monitor ammonia level.  Pruritus: Chronic, improved.  Managed with Benadryl itch cream, Eucerine cream, hydrocortisone cream and hydroxyzine tab. Follow up with Dermatologist as planned.  Depression: Continue Paroxetine as ordered.  Psych consult as scheduled/needed. Continue to encourage to  participate in facility activities. Patient interacts with staff and residents; denies depressive mood.   Follow up: Palliative care will continue to follow for complex medical decision making, advance care planning, and clarification of goals. Return 6 weeks or prn. Encouraged to call provider sooner with any concerns.  CHIEF COMPLAINT: Palliative follow up  HISTORY OF PRESENT ILLNESS:  Judy Meza a 61 y.o. female with multiple medical problems multiple morbidities requiring close monitoring/management with high risk of complications and morbidity:  Type 1 DM, alcoholic liver cirrhosis, pruritis, COPD, viral hep C, CKD 3, hypertension, throat cancer.  She denies hypoglycemic/hyperglycemic symptoms, moodiness, pain/discomfort, no respiratory distress, no recent COPD exacerbation.Marland Kitchen History obtained from review of EMR, discussion with primary team, family and/or patient. Records reviewed and summarized above. All 10 point systems reviewed and are negative except as documented in history of present illness above Review and summarization of Epic records shows history from other than patient.   Palliative Care was asked to follow this patient o help address complex decision making in the context of advance care planning and goals of care clarification.    PERTINENT MEDICATIONS:  Outpatient Encounter Medications as of 12/05/2021  Medication Sig   acetaminophen (TYLENOL) 325 MG tablet Take 650 mg by mouth every 4 (four) hours as needed for mild pain or fever.   albuterol (VENTOLIN HFA) 108 (90 Base) MCG/ACT inhaler Inhale 2 puffs into the lungs every 6 (six) hours as needed for shortness of breath.   Boric Acid Vaginal (AZO BORIC ACID) 600 MG SUPP Place 1 suppository vaginally at bedtime.   Carboxymethylcellul-Glycerin (REFRESH OPTIVE) 1-0.9 % GEL Place 1 drop into both eyes 4 (four) times daily.   diclofenac Sodium (VOLTAREN) 1 % GEL  Apply 2 g topically 4 (four) times daily. Left ankle and leg    diphenhydrAMINE-zinc acetate (BENADRYL ITCH STOPPING) cream Apply 1 application topically every 4 (four) hours as needed for itching.   ergocalciferol (VITAMIN D2) 1.25 MG (50000 UT) capsule Take 50,000 Units by mouth every Friday.   famotidine (PEPCID) 20 MG tablet Take 20 mg by mouth daily.   folic acid (FOLVITE) 1 MG tablet Take 1 tablet (1 mg total) by mouth daily.   gabapentin (NEURONTIN) 100 MG capsule Take 100 mg by mouth 3 (three) times daily.   hydrocortisone 2.5 % cream Apply 1 application topically See admin instructions. Apply to back of neck every shift.   hydrOXYzine (ATARAX/VISTARIL) 25 MG tablet Take 25 mg by mouth every 6 (six) hours as needed for itching.   insulin aspart (NOVOLOG) 100 UNIT/ML injection Inject 5 Units into the skin 3 (three) times daily with meals. If eating >50% of meals   insulin aspart (NOVOLOG) 100 UNIT/ML injection Inject 0-15 Units into the skin 3 (three) times daily with meals.   insulin glargine-yfgn (SEMGLEE) 100 UNIT/ML injection Inject 0.18 mLs (18 Units total) into the skin daily.   ketotifen (ZADITOR) 0.025 % ophthalmic solution Place 1 drop into both eyes See admin instructions. Qd x 21 days   lactulose (CHRONULAC) 10 GM/15ML solution Take 30 g by mouth in the morning, at noon, in the evening, and at bedtime.   lidocaine (LIDODERM) 5 % Place 1 patch onto the skin daily. Remove & Discard patch within 12 hours or as directed by MD   loratadine (CLARITIN) 10 MG tablet Take 10 mg by mouth daily.   nadolol (CORGARD) 20 MG tablet Take 1 tablet (20 mg total) by mouth daily.   Olopatadine HCl 0.2 % SOLN Place 1 drop into both eyes daily as needed (allergic conjunctivitis).   ondansetron (ZOFRAN ODT) 8 MG disintegrating tablet Take 1 tablet (8 mg total) by mouth every 8 (eight) hours as needed for nausea or vomiting.   ondansetron (ZOFRAN) 8 MG tablet Take by mouth every 8 (eight) hours as needed for nausea or vomiting.   OxyCODONE HCl, Abuse Deter, (OXAYDO)  5 MG TABA Take 5 mg by mouth every 6 (six) hours as needed.   PARoxetine (PAXIL) 40 MG tablet Take 40 mg by mouth daily.   potassium chloride SA (KLOR-CON M) 20 MEQ tablet Take 20 mEq by mouth daily.   rifaximin (XIFAXAN) 550 MG TABS tablet Take 1 tablet (550 mg total) by mouth 2 (two) times daily.   Skin Protectants, Misc. (EUCERIN) cream Apply 1 application topically every 12 (twelve) hours as needed for dry skin.   sodium bicarbonate 650 MG tablet Take 1 tablet (650 mg total) by mouth 2 (two) times daily.   thiamine 100 MG tablet Take 1 tablet (100 mg total) by mouth daily.   No facility-administered encounter medications on file as of 12/05/2021.    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 More than 50% of the time in this consultation was spent on counseling and coordinating communication   Thank you for the opportunity to participate in the care of ANEL CREIGHTON Please call our office at [phone] if we can be of additional  assistance.  Note: Portions of this note were generated with Lobbyist. Dictation errors may occur despite best attempts at proofreading.  Teodoro Spray, NP

## 2022-01-09 ENCOUNTER — Emergency Department (HOSPITAL_COMMUNITY): Payer: Medicaid Other

## 2022-01-09 ENCOUNTER — Inpatient Hospital Stay (HOSPITAL_COMMUNITY)
Admission: EM | Admit: 2022-01-09 | Discharge: 2022-01-13 | DRG: 442 | Disposition: A | Discharge status: Skilled Nursing Facility | Payer: Medicaid Other | Source: Skilled Nursing Facility | Attending: Internal Medicine | Admitting: Internal Medicine

## 2022-01-09 DIAGNOSIS — Z79899 Other long term (current) drug therapy: Secondary | ICD-10-CM

## 2022-01-09 DIAGNOSIS — F03911 Unspecified dementia, unspecified severity, with agitation: Secondary | ICD-10-CM | POA: Diagnosis present

## 2022-01-09 DIAGNOSIS — D61818 Other pancytopenia: Secondary | ICD-10-CM | POA: Diagnosis present

## 2022-01-09 DIAGNOSIS — Z66 Do not resuscitate: Secondary | ICD-10-CM | POA: Diagnosis present

## 2022-01-09 DIAGNOSIS — Z794 Long term (current) use of insulin: Secondary | ICD-10-CM

## 2022-01-09 DIAGNOSIS — E876 Hypokalemia: Secondary | ICD-10-CM | POA: Diagnosis present

## 2022-01-09 DIAGNOSIS — K7031 Alcoholic cirrhosis of liver with ascites: Secondary | ICD-10-CM | POA: Diagnosis present

## 2022-01-09 DIAGNOSIS — Z781 Physical restraint status: Secondary | ICD-10-CM

## 2022-01-09 DIAGNOSIS — Z8521 Personal history of malignant neoplasm of larynx: Secondary | ICD-10-CM

## 2022-01-09 DIAGNOSIS — B182 Chronic viral hepatitis C: Secondary | ICD-10-CM | POA: Diagnosis present

## 2022-01-09 DIAGNOSIS — Z720 Tobacco use: Secondary | ICD-10-CM | POA: Diagnosis present

## 2022-01-09 DIAGNOSIS — K219 Gastro-esophageal reflux disease without esophagitis: Secondary | ICD-10-CM | POA: Diagnosis present

## 2022-01-09 DIAGNOSIS — K222 Esophageal obstruction: Secondary | ICD-10-CM

## 2022-01-09 DIAGNOSIS — F0390 Unspecified dementia without behavioral disturbance: Secondary | ICD-10-CM | POA: Diagnosis present

## 2022-01-09 DIAGNOSIS — K7290 Hepatic failure, unspecified without coma: Secondary | ICD-10-CM | POA: Diagnosis present

## 2022-01-09 DIAGNOSIS — I8510 Secondary esophageal varices without bleeding: Secondary | ICD-10-CM | POA: Diagnosis present

## 2022-01-09 DIAGNOSIS — Z809 Family history of malignant neoplasm, unspecified: Secondary | ICD-10-CM

## 2022-01-09 DIAGNOSIS — R9431 Abnormal electrocardiogram [ECG] [EKG]: Secondary | ICD-10-CM | POA: Diagnosis present

## 2022-01-09 DIAGNOSIS — I129 Hypertensive chronic kidney disease with stage 1 through stage 4 chronic kidney disease, or unspecified chronic kidney disease: Secondary | ICD-10-CM | POA: Diagnosis present

## 2022-01-09 DIAGNOSIS — Z8249 Family history of ischemic heart disease and other diseases of the circulatory system: Secondary | ICD-10-CM

## 2022-01-09 DIAGNOSIS — Z8616 Personal history of COVID-19: Secondary | ICD-10-CM

## 2022-01-09 DIAGNOSIS — K7030 Alcoholic cirrhosis of liver without ascites: Secondary | ICD-10-CM | POA: Diagnosis present

## 2022-01-09 DIAGNOSIS — F17210 Nicotine dependence, cigarettes, uncomplicated: Secondary | ICD-10-CM | POA: Diagnosis present

## 2022-01-09 DIAGNOSIS — J4489 Other specified chronic obstructive pulmonary disease: Secondary | ICD-10-CM | POA: Diagnosis present

## 2022-01-09 DIAGNOSIS — E1122 Type 2 diabetes mellitus with diabetic chronic kidney disease: Secondary | ICD-10-CM | POA: Diagnosis present

## 2022-01-09 DIAGNOSIS — N184 Chronic kidney disease, stage 4 (severe): Secondary | ICD-10-CM | POA: Diagnosis present

## 2022-01-09 DIAGNOSIS — Z930 Tracheostomy status: Secondary | ICD-10-CM

## 2022-01-09 DIAGNOSIS — F1021 Alcohol dependence, in remission: Secondary | ICD-10-CM

## 2022-01-09 DIAGNOSIS — I8511 Secondary esophageal varices with bleeding: Secondary | ICD-10-CM | POA: Diagnosis present

## 2022-01-09 DIAGNOSIS — K7460 Unspecified cirrhosis of liver: Secondary | ICD-10-CM | POA: Diagnosis present

## 2022-01-09 DIAGNOSIS — D696 Thrombocytopenia, unspecified: Secondary | ICD-10-CM | POA: Diagnosis present

## 2022-01-09 DIAGNOSIS — L299 Pruritus, unspecified: Secondary | ICD-10-CM | POA: Diagnosis present

## 2022-01-09 DIAGNOSIS — F32A Depression, unspecified: Secondary | ICD-10-CM | POA: Diagnosis present

## 2022-01-09 DIAGNOSIS — G4733 Obstructive sleep apnea (adult) (pediatric): Secondary | ICD-10-CM | POA: Diagnosis present

## 2022-01-09 DIAGNOSIS — K7682 Hepatic encephalopathy: Principal | ICD-10-CM | POA: Diagnosis present

## 2022-01-09 DIAGNOSIS — G9341 Metabolic encephalopathy: Secondary | ICD-10-CM | POA: Diagnosis present

## 2022-01-09 DIAGNOSIS — Z923 Personal history of irradiation: Secondary | ICD-10-CM

## 2022-01-09 MED ORDER — LORAZEPAM 2 MG/ML IJ SOLN
1.0000 mg | Freq: Once | INTRAMUSCULAR | Status: AC
  Administered 2022-01-09: 1 mg via INTRAMUSCULAR
  Filled 2022-01-09: qty 1

## 2022-01-09 NOTE — ED Notes (Signed)
ED provider at bedside.
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
## 2022-01-09 NOTE — ED Notes (Signed)
Pt has a stoma from a previous trach, noted blood tinged drainage on dressing, pt is wording her word but is non-verbal. She is combative with all staff members at this time

## 2022-01-09 NOTE — ED Provider Notes (Cosign Needed)
[facility] EMERGENCY DEPARTMENT Provider Note   CSN: [id number] Arrival date & time: 01/09/22  2214     History  Chief Complaint  Patient presents with   Altered Mental Status    Judy Meza is a 61 y.o. female.  61 y/o female with hx of HTN, COPD, DM, liver cirrhosis 2/2 alcoholism, NPH, CKD, hx of laryngeal cancer, and dementia presents to the ED from [facility] for c/o AMS. Per facility, patient has been increasingly combative and violent with staff. She has presented similarly when experiencing metabolic encephalopathy. Facility states the patient has not been taking her lactulose.  Per reports, patient normally ambulates and converses normally at baseline; goes outside facility to smoke during the day. Patient unwilling to contribute to history. Combative with staff in the ED and stating that she wants to be "left alone".   The history is provided by a relative, the patient and the EMS personnel. No language interpreter was used.  Altered Mental Status      Home Medications Prior to Admission medications   Medication Sig Start Date End Date Taking? Authorizing Provider  acetaminophen (TYLENOL) 325 MG tablet Take 650 mg by mouth every 4 (four) hours as needed for mild pain or fever.    [provider]  albuterol (VENTOLIN HFA) 108 (90 Base) MCG/ACT inhaler Inhale 2 puffs into the lungs every 6 (six) hours as needed for shortness of breath.    [provider]  Boric Acid Vaginal (AZO BORIC ACID) 600 MG SUPP Place 1 suppository vaginally at bedtime. 11/27/21   Shelly Bombard, MD  Carboxymethylcellul-Glycerin (REFRESH OPTIVE) 1-0.9 % GEL Place 1 drop into both eyes 4 (four) times daily.    [provider]  diclofenac Sodium (VOLTAREN) 1 % GEL Apply 2 g topically 4 (four) times daily. Left ankle and leg 11/01/20   Geradine Girt, DO  diphenhydrAMINE-zinc acetate (BENADRYL ITCH STOPPING) cream Apply 1 application topically every  4 (four) hours as needed for itching.    [provider]  ergocalciferol (VITAMIN D2) 1.25 MG (50000 UT) capsule Take 50,000 Units by mouth every Friday.    [provider]  famotidine (PEPCID) 20 MG tablet Take 20 mg by mouth daily.    [provider]  folic acid (FOLVITE) 1 MG tablet Take 1 tablet (1 mg total) by mouth daily. 10/09/19   Andrew Au, MD  gabapentin (NEURONTIN) 100 MG capsule Take 100 mg by mouth 3 (three) times daily.    [provider]  hydrocortisone 2.5 % cream Apply 1 application topically See admin instructions. Apply to back of neck every shift.    [provider]  hydrOXYzine (ATARAX/VISTARIL) 25 MG tablet Take 25 mg by mouth every 6 (six) hours as needed for itching.    [provider]  insulin aspart (NOVOLOG) 100 UNIT/ML injection Inject 5 Units into the skin 3 (three) times daily with meals. If eating >50% of meals 11/01/20   Eulogio Bear U, DO  insulin aspart (NOVOLOG) 100 UNIT/ML injection Inject 0-15 Units into the skin 3 (three) times daily with meals. 11/01/20   Geradine Girt, DO  insulin glargine-yfgn (SEMGLEE) 100 UNIT/ML injection Inject 0.18 mLs (18 Units total) into the skin daily. 11/02/20   Geradine Girt, DO  ketotifen (ZADITOR) 0.025 % ophthalmic solution Place 1 drop into both eyes See admin instructions. Qd x 21 days    [provider]  lactulose (CHRONULAC) 10 GM/15ML solution Take 30  g by mouth in the morning, at noon, in the evening, and at bedtime.    [provider]  lidocaine (LIDODERM) 5 % Place 1 patch onto the skin daily. Remove & Discard patch within 12 hours or as directed by MD 11/02/20   Geradine Girt, DO  loratadine (CLARITIN) 10 MG tablet Take 10 mg by mouth daily.    [provider]  nadolol (CORGARD) 20 MG tablet Take 1 tablet (20 mg total) by mouth daily. 11/02/20   Geradine Girt, DO  Olopatadine HCl 0.2 % SOLN Place 1 drop into both eyes daily as  needed (allergic conjunctivitis).    [provider]  ondansetron (ZOFRAN ODT) 8 MG disintegrating tablet Take 1 tablet (8 mg total) by mouth every 8 (eight) hours as needed for nausea or vomiting. 09/28/20   Izora Gala, MD  ondansetron (ZOFRAN) 8 MG tablet Take by mouth every 8 (eight) hours as needed for nausea or vomiting.    [provider]  OxyCODONE HCl, Abuse Deter, (OXAYDO) 5 MG TABA Take 5 mg by mouth every 6 (six) hours as needed.    [provider]  PARoxetine (PAXIL) 40 MG tablet Take 40 mg by mouth daily.    [provider]  potassium chloride SA (KLOR-CON M) 20 MEQ tablet Take 20 mEq by mouth daily.    [provider]  rifaximin (XIFAXAN) 550 MG TABS tablet Take 1 tablet (550 mg total) by mouth 2 (two) times daily. 01/22/20   Cirigliano, Vito V, DO  Skin Protectants, Misc. (EUCERIN) cream Apply 1 application topically every 12 (twelve) hours as needed for dry skin.    [provider]  sodium bicarbonate 650 MG tablet Take 1 tablet (650 mg total) by mouth 2 (two) times daily. 08/09/19   Hosie Poisson, MD  thiamine 100 MG tablet Take 1 tablet (100 mg total) by mouth daily. 06/08/19   Mendel Corning, MD      Allergies    Patient has no known allergies.    Review of Systems   Review of Systems  Unable to perform ROS: Mental status change    Physical Exam Updated Vital Signs BP (!) 177/104 (BP Location: Right Arm)   Pulse 77   Temp 98.6 F (37 C) (Axillary)   Resp 14   SpO2 92%   Physical Exam Vitals and nursing note reviewed.  Constitutional:      General: She is not in acute distress.    Appearance: She is well-developed. She is not diaphoretic.     Comments: Chronically ill appearing AA female. Attempting to hit and punch staff.  HENT:     Head: Normocephalic and atraumatic.  Eyes:     General: Scleral icterus present.     Conjunctiva/sclera: Conjunctivae normal.  Neck:     Comments: Unable to visualize stoma  site due to patient refusal and combativeness; however there is brown colored drainage on the bandage. Cardiovascular:     Rate and Rhythm: Normal rate and regular rhythm.     Pulses: Normal pulses.  Pulmonary:     Effort: Pulmonary effort is normal. No respiratory distress.     Breath sounds: No stridor. No wheezing.     Comments: Respirations even and unlabored. Lungs grossly CTAB. Abdominal:     General: There is distension.     Palpations: Abdomen is soft.     Tenderness: There is no abdominal tenderness.     Comments: Abdomen mildly distended, nontender  Musculoskeletal:  General: Normal range of motion.     Cervical back: Normal range of motion.     Comments: BLE edema  Skin:    General: Skin is warm and dry.     Coloration: Skin is not pale.     Findings: No erythema or rash.  Neurological:     Comments: Alert and moving all extremities spontaneously. Attempting to get out of bed to leave the department.  Psychiatric:        Mood and Affect: Affect is angry.        Behavior: Behavior is uncooperative and agitated.     ED Results / Procedures / Treatments   Labs (all labs ordered are listed, but only abnormal results are displayed) Labs Reviewed  CBC WITH DIFFERENTIAL/PLATELET - Abnormal; Notable for the following components:      Result Value   WBC 2.9 (*)    RBC 3.44 (*)    Hemoglobin 8.8 (*)    HCT 29.1 (*)    MCH 25.6 (*)    RDW 18.7 (*)    Platelets 101 (*)    Neutro Abs 1.5 (*)    All other components within normal limits  COMPREHENSIVE METABOLIC PANEL - Abnormal; Notable for the following components:   Potassium 3.2 (*)    Creatinine, Ser 1.58 (*)    Total Protein 8.2 (*)    Albumin 2.8 (*)    AST 46 (*)    Total Bilirubin 2.1 (*)    GFR, Estimated 37 (*)    All other components within normal limits  PROTIME-INR - Abnormal; Notable for the following components:   Prothrombin Time 15.8 (*)    INR 1.3 (*)    All other components within normal  limits  AMMONIA - Abnormal; Notable for the following components:   Ammonia 64 (*)    All other components within normal limits  I-STAT VENOUS BLOOD GAS, ED - Abnormal; Notable for the following components:   pO2, Ven 80 (*)    Bicarbonate 28.1 (*)    HCT 30.0 (*)    Hemoglobin 10.2 (*)    All other components within normal limits  ETHANOL  URINALYSIS, ROUTINE W REFLEX MICROSCOPIC  RAPID URINE DRUG SCREEN, HOSP PERFORMED  AMMONIA  PROTIME-INR  CBC WITH DIFFERENTIAL/PLATELET  BASIC METABOLIC PANEL  HIV ANTIBODY (ROUTINE TESTING W REFLEX)  HEMOGLOBIN A1C  CBG MONITORING, ED    EKG EKG Interpretation  Date/Time:  Friday January 09 2022 23:02:43 EST Ventricular Rate:  72 PR Interval:  172 QRS Duration: 93 QT Interval:  511 QTC Calculation: 560 R Axis:   58 Text Interpretation: Sinus rhythm Consider left ventricular hypertrophy Prolonged QT interval Confirmed by Addison Lank [phone]) on 01/10/2022 12:17:14 AM  Radiology CT HEAD WO CONTRAST (5MM)  Result Date: 01/10/2022 CLINICAL DATA:  Altered mental status. EXAM: CT HEAD WITHOUT CONTRAST TECHNIQUE: Contiguous axial images were obtained from the base of the skull through the vertex without intravenous contrast. RADIATION DOSE REDUCTION: This exam was performed according to the departmental dose-optimization program which includes automated exposure control, adjustment of the mA and/or kV according to patient size and/or use of iterative reconstruction technique. COMPARISON:  November 11, 2019 FINDINGS: Brain: There is no evidence of acute infarction, hemorrhage, hydrocephalus, extra-axial collection or mass lesion/mass effect. There are areas of decreased attenuation within the white matter tracts of the supratentorial brain, consistent with microvascular disease changes. Vascular: No hyperdense vessel or unexpected calcification. Skull: Normal. Negative for fracture or focal lesion. Sinuses/Orbits: No  acute finding. Other: None.  IMPRESSION: 1. No acute intracranial abnormality. 2. 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 Electronically Signed   By: Virgina Norfolk M.D.   On: 01/10/2022 02:39   DG Chest Port 1 View  Result Date: 01/09/2022 CLINICAL DATA:  Altered mental status. EXAM: PORTABLE CHEST 1 VIEW COMPARISON:  October 28, 2020 FINDINGS: The heart size and mediastinal contours are within normal limits. There is tortuosity of the descending thoracic aorta. Mild atelectasis is seen within the left lung base. There is no evidence of a pleural effusion or pneumothorax. The visualized skeletal structures are unremarkable. IMPRESSION: Mild left basilar atelectasis. Electronically Signed   By: Virgina Norfolk M.D.   On: 01/09/2022 22:48    Procedures Procedures    Medications Ordered in ED Medications  lactulose (CHRONULAC) 10 GM/15ML solution 30 g (has no administration in time range)  enoxaparin (LOVENOX) injection 40 mg (has no administration in time range)  acetaminophen (TYLENOL) tablet 650 mg (has no administration in time range)    Or  acetaminophen (TYLENOL) suppository 650 mg (has no administration in time range)  lactated ringers infusion ( Intravenous New Bag/Given 01/10/22 0541)  insulin glargine-yfgn (SEMGLEE) injection 8 Units (has no administration in time range)  insulin aspart (novoLOG) injection 0-15 Units (has no administration in time range)  insulin aspart (novoLOG) injection 0-5 Units (has no administration in time range)  famotidine (PEPCID) IVPB 20 mg premix (has no administration in time range)  rifaximin (XIFAXAN) tablet 550 mg (has no administration in time range)  hydrALAZINE (APRESOLINE) injection 10 mg (has no administration in time range)  prochlorperazine (COMPAZINE) injection 10 mg (has no administration in time range)  LORazepam (ATIVAN) injection 1 mg (1 mg Intramuscular Given 01/09/22 2226)  lactulose (CHRONULAC) 10 GM/15ML solution 30 g (30 g Oral  Given 01/10/22 0335)    ED Course/ Medical Decision Making/ A&P Clinical Course as of 01/10/22 0606  Fri Jan 09, 2022  2234 Spoke with patient's guardian, [name] (sister). She states that the patient had COVID a vaccine on 01/03/22. Has a history of poor PO intake and may be dehydrated. Patient is also noncompliant with taking her medication if not being watched by the facility. Hx of IVDU and alcoholism with difficult IV access. However, contrary to MOST form and recent palliative notes, guardian is OK with IV fluids, IV medication and/or antibiotics. Confirms patient is to remain DNR and DNI should her status decline. [RC]  7893 Per EMS, patient has not been taking her lactulose at her facility and has a hx of combativeness when experiencing AMS due to metabolic encephalopathy. [KH]  Sat Jan 10, 2022  8101 Case discussed with Dr. Claria Dice of [facility] who will assess the patient in the ED for admission. [KH]  7510 Closed loop with patient's guardian to make her aware of plan for admission.  [KH]    Clinical Course User Index [KH] Antonietta Breach, PA-C                           Medical Decision Making Amount and/or Complexity of Data Reviewed Labs: ordered. Radiology: ordered.  Risk Prescription drug management. Decision regarding hospitalization.   This patient presents to the ED for concern of altered mental status, this involves an extensive number of treatment options, and is a complaint that carries with it a high risk of complications and morbidity.  The differential diagnosis includes ICH vs hypoglycemia vs DKA vs HHS vs  hypoxemia vs metabolic encephalopathy   Co morbidities that complicate the patient evaluation  Cirrhosis DM Dementia   Additional history obtained:  Additional history obtained from EMS as well as guardian, [name] (sister) External records from outside source obtained and reviewed including historical ammonia values   Lab Tests:  I Ordered, and  personally interpreted labs.  The pertinent results include:  Acute findings include: leukopenia of 2.9, hemoglobin of 8.8 (baseline 11-12), potassium of 3.2, ammonia of 64.   Imaging Studies ordered:  I ordered imaging studies including CXR and head CT I independently visualized and interpreted Xray imaging which showed L basilar atelectasis. I independently visualized and interpreted CT imaging which showed no acute pathology. I agree with the radiologist interpretation   Cardiac Monitoring:  The patient was maintained on a cardiac monitor.  I personally viewed and interpreted the cardiac monitored which showed an underlying rhythm of: NSR   Medicines ordered and prescription drug management:  I ordered medication including Ativan for combativeness and agitation and lactulose for hepatic encephalopathy/altered mental status.  Reevaluation of the patient after these medicines showed that the patient  remained stable I have reviewed the patients home medicines and have made adjustments as needed   Test Considered:  UDS and UA unable to be obtained in ED   Consultations Obtained:  I requested consultation with the hospitalist service and discussed lab and imaging findings - they agree with plan for admission. Will assess the patient in the ED.   Problem List / ED Course:  As above   Reevaluation:  After the interventions noted above, I reevaluated the patient and found that they have :stayed the same   Social Determinants of Health:  SNF resident Prior tracheostomy   Dispostion:  After consideration of the diagnostic results and the patients response to treatment, I feel that the patent would benefit from admission for ongoing management of acute hepatic encephalopathy. Code status is DNR/DNI. Hospitalist to admit.         Final Clinical Impression(s) / ED Diagnoses Final diagnoses:  Acute hepatic encephalopathy [facility])    Rx / DC Orders ED Discharge  Orders     None         Antonietta Breach, PA-C 01/10/22 6629
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
## 2022-01-10 ENCOUNTER — Emergency Department (HOSPITAL_COMMUNITY): Payer: Medicaid Other

## 2022-01-10 ENCOUNTER — Encounter (HOSPITAL_COMMUNITY): Payer: Self-pay | Admitting: Family Medicine

## 2022-01-10 DIAGNOSIS — D61818 Other pancytopenia: Secondary | ICD-10-CM | POA: Diagnosis present

## 2022-01-10 DIAGNOSIS — R9431 Abnormal electrocardiogram [ECG] [EKG]: Secondary | ICD-10-CM | POA: Diagnosis not present

## 2022-01-10 DIAGNOSIS — Z8249 Family history of ischemic heart disease and other diseases of the circulatory system: Secondary | ICD-10-CM | POA: Diagnosis not present

## 2022-01-10 DIAGNOSIS — K7290 Hepatic failure, unspecified without coma: Secondary | ICD-10-CM | POA: Diagnosis present

## 2022-01-10 DIAGNOSIS — Z923 Personal history of irradiation: Secondary | ICD-10-CM | POA: Diagnosis not present

## 2022-01-10 DIAGNOSIS — Z66 Do not resuscitate: Secondary | ICD-10-CM | POA: Diagnosis present

## 2022-01-10 DIAGNOSIS — I8510 Secondary esophageal varices without bleeding: Secondary | ICD-10-CM | POA: Diagnosis present

## 2022-01-10 DIAGNOSIS — Z794 Long term (current) use of insulin: Secondary | ICD-10-CM | POA: Diagnosis not present

## 2022-01-10 DIAGNOSIS — L299 Pruritus, unspecified: Secondary | ICD-10-CM | POA: Diagnosis present

## 2022-01-10 DIAGNOSIS — F32A Depression, unspecified: Secondary | ICD-10-CM | POA: Diagnosis present

## 2022-01-10 DIAGNOSIS — I129 Hypertensive chronic kidney disease with stage 1 through stage 4 chronic kidney disease, or unspecified chronic kidney disease: Secondary | ICD-10-CM | POA: Diagnosis present

## 2022-01-10 DIAGNOSIS — Z930 Tracheostomy status: Secondary | ICD-10-CM | POA: Diagnosis not present

## 2022-01-10 DIAGNOSIS — Z79899 Other long term (current) drug therapy: Secondary | ICD-10-CM | POA: Diagnosis not present

## 2022-01-10 DIAGNOSIS — N184 Chronic kidney disease, stage 4 (severe): Secondary | ICD-10-CM | POA: Diagnosis present

## 2022-01-10 DIAGNOSIS — Z8521 Personal history of malignant neoplasm of larynx: Secondary | ICD-10-CM | POA: Diagnosis not present

## 2022-01-10 DIAGNOSIS — E1122 Type 2 diabetes mellitus with diabetic chronic kidney disease: Secondary | ICD-10-CM

## 2022-01-10 DIAGNOSIS — I8511 Secondary esophageal varices with bleeding: Secondary | ICD-10-CM | POA: Diagnosis not present

## 2022-01-10 DIAGNOSIS — D696 Thrombocytopenia, unspecified: Secondary | ICD-10-CM | POA: Diagnosis not present

## 2022-01-10 DIAGNOSIS — B182 Chronic viral hepatitis C: Secondary | ICD-10-CM

## 2022-01-10 DIAGNOSIS — F17210 Nicotine dependence, cigarettes, uncomplicated: Secondary | ICD-10-CM | POA: Diagnosis present

## 2022-01-10 DIAGNOSIS — K7682 Hepatic encephalopathy: Secondary | ICD-10-CM | POA: Diagnosis not present

## 2022-01-10 DIAGNOSIS — Z781 Physical restraint status: Secondary | ICD-10-CM | POA: Diagnosis not present

## 2022-01-10 DIAGNOSIS — N182 Chronic kidney disease, stage 2 (mild): Secondary | ICD-10-CM

## 2022-01-10 DIAGNOSIS — K219 Gastro-esophageal reflux disease without esophagitis: Secondary | ICD-10-CM | POA: Diagnosis present

## 2022-01-10 DIAGNOSIS — K7031 Alcoholic cirrhosis of liver with ascites: Secondary | ICD-10-CM

## 2022-01-10 DIAGNOSIS — E876 Hypokalemia: Secondary | ICD-10-CM | POA: Diagnosis present

## 2022-01-10 DIAGNOSIS — K7030 Alcoholic cirrhosis of liver without ascites: Secondary | ICD-10-CM | POA: Diagnosis present

## 2022-01-10 DIAGNOSIS — Z720 Tobacco use: Secondary | ICD-10-CM

## 2022-01-10 DIAGNOSIS — Z809 Family history of malignant neoplasm, unspecified: Secondary | ICD-10-CM | POA: Diagnosis not present

## 2022-01-10 DIAGNOSIS — F03911 Unspecified dementia, unspecified severity, with agitation: Secondary | ICD-10-CM | POA: Diagnosis present

## 2022-01-10 DIAGNOSIS — Z8616 Personal history of COVID-19: Secondary | ICD-10-CM | POA: Diagnosis not present

## 2022-01-10 LAB — PROTIME-INR
INR: 1.2 (ref 0.8–1.2)
INR: 1.3 — ABNORMAL HIGH (ref 0.8–1.2)
Prothrombin Time: 15.3 seconds — ABNORMAL HIGH (ref 11.4–15.2)
Prothrombin Time: 15.8 seconds — ABNORMAL HIGH (ref 11.4–15.2)

## 2022-01-10 LAB — COMPREHENSIVE METABOLIC PANEL
ALT: 20 U/L (ref 0–44)
AST: 46 U/L — ABNORMAL HIGH (ref 15–41)
Albumin: 2.8 g/dL — ABNORMAL LOW (ref 3.5–5.0)
Alkaline Phosphatase: 114 U/L (ref 38–126)
Anion gap: 8 (ref 5–15)
BUN: 11 mg/dL (ref 6–20)
CO2: 24 mmol/L (ref 22–32)
Calcium: 9.3 mg/dL (ref 8.9–10.3)
Chloride: 106 mmol/L (ref 98–111)
Creatinine, Ser: 1.58 mg/dL — ABNORMAL HIGH (ref 0.44–1.00)
GFR, Estimated: 37 mL/min — ABNORMAL LOW (ref >60)
Glucose, Bld: 90 mg/dL (ref 70–99)
Potassium: 3.2 mmol/L — ABNORMAL LOW (ref 3.5–5.1)
Sodium: 138 mmol/L (ref 135–145)
Total Bilirubin: 2.1 mg/dL — ABNORMAL HIGH (ref 0.3–1.2)
Total Protein: 8.2 g/dL — ABNORMAL HIGH (ref 6.5–8.1)

## 2022-01-10 LAB — I-STAT VENOUS BLOOD GAS, ED
Acid-Base Excess: 2 mmol/L (ref 0.0–2.0)
Bicarbonate: 28.1 mmol/L — ABNORMAL HIGH (ref 20.0–28.0)
Calcium, Ion: 1.19 mmol/L (ref 1.15–1.40)
HCT: 30 % — ABNORMAL LOW (ref 36.0–46.0)
Hemoglobin: 10.2 g/dL — ABNORMAL LOW (ref 12.0–15.0)
O2 Saturation: 95 %
Patient temperature: 37
Potassium: 3.7 mmol/L (ref 3.5–5.1)
Sodium: 142 mmol/L (ref 135–145)
TCO2: 30 mmol/L (ref 22–32)
pCO2, Ven: 53.1 mmHg (ref 44–60)
pH, Ven: 7.331 (ref 7.25–7.43)
pO2, Ven: 80 mmHg — ABNORMAL HIGH (ref 32–45)

## 2022-01-10 LAB — CBC WITH DIFFERENTIAL/PLATELET
Abs Immature Granulocytes: 0 10*3/uL (ref 0.00–0.07)
Abs Immature Granulocytes: 0.02 10*3/uL (ref 0.00–0.07)
Basophils Absolute: 0 10*3/uL (ref 0.0–0.1)
Basophils Absolute: 0 10*3/uL (ref 0.0–0.1)
Basophils Relative: 1 %
Basophils Relative: 1 %
Eosinophils Absolute: 0 10*3/uL (ref 0.0–0.5)
Eosinophils Absolute: 0.1 10*3/uL (ref 0.0–0.5)
Eosinophils Relative: 1 %
Eosinophils Relative: 3 %
HCT: 29.1 % — ABNORMAL LOW (ref 36.0–46.0)
HCT: 35.3 % — ABNORMAL LOW (ref 36.0–46.0)
Hemoglobin: 10.6 g/dL — ABNORMAL LOW (ref 12.0–15.0)
Hemoglobin: 8.8 g/dL — ABNORMAL LOW (ref 12.0–15.0)
Immature Granulocytes: 0 %
Immature Granulocytes: 1 %
Lymphocytes Relative: 32 %
Lymphocytes Relative: 39 %
Lymphs Abs: 0.9 10*3/uL (ref 0.7–4.0)
Lymphs Abs: 1.4 10*3/uL (ref 0.7–4.0)
MCH: 25.4 pg — ABNORMAL LOW (ref 26.0–34.0)
MCH: 25.6 pg — ABNORMAL LOW (ref 26.0–34.0)
MCHC: 30 g/dL (ref 30.0–36.0)
MCHC: 30.2 g/dL (ref 30.0–36.0)
MCV: 84.4 fL (ref 80.0–100.0)
MCV: 84.6 fL (ref 80.0–100.0)
Monocytes Absolute: 0.3 10*3/uL (ref 0.1–1.0)
Monocytes Absolute: 0.5 10*3/uL (ref 0.1–1.0)
Monocytes Relative: 12 %
Monocytes Relative: 12 %
Neutro Abs: 1.5 10*3/uL — ABNORMAL LOW (ref 1.7–7.7)
Neutro Abs: 1.8 10*3/uL (ref 1.7–7.7)
Neutrophils Relative %: 46 %
Neutrophils Relative %: 52 %
Platelets: 101 10*3/uL — ABNORMAL LOW (ref 150–400)
Platelets: 146 10*3/uL — ABNORMAL LOW (ref 150–400)
RBC: 3.44 MIL/uL — ABNORMAL LOW (ref 3.87–5.11)
RBC: 4.18 MIL/uL (ref 3.87–5.11)
RDW: 18.6 % — ABNORMAL HIGH (ref 11.5–15.5)
RDW: 18.7 % — ABNORMAL HIGH (ref 11.5–15.5)
WBC: 2.9 10*3/uL — ABNORMAL LOW (ref 4.0–10.5)
WBC: 3.7 10*3/uL — ABNORMAL LOW (ref 4.0–10.5)
nRBC: 0 % (ref 0.0–0.2)
nRBC: 0 % (ref 0.0–0.2)

## 2022-01-10 LAB — BASIC METABOLIC PANEL
Anion gap: 11 (ref 5–15)
BUN: 13 mg/dL (ref 6–20)
CO2: 23 mmol/L (ref 22–32)
Calcium: 9.3 mg/dL (ref 8.9–10.3)
Chloride: 104 mmol/L (ref 98–111)
Creatinine, Ser: 1.42 mg/dL — ABNORMAL HIGH (ref 0.44–1.00)
GFR, Estimated: 43 mL/min — ABNORMAL LOW (ref >60)
Glucose, Bld: 109 mg/dL — ABNORMAL HIGH (ref 70–99)
Potassium: 3.9 mmol/L (ref 3.5–5.1)
Sodium: 138 mmol/L (ref 135–145)

## 2022-01-10 LAB — HEMOGLOBIN A1C
Hgb A1c MFr Bld: 5.4 % (ref 4.8–5.6)
Mean Plasma Glucose: 108.28 mg/dL

## 2022-01-10 LAB — GLUCOSE, CAPILLARY
Glucose-Capillary: 91 mg/dL (ref 70–99)
Glucose-Capillary: 96 mg/dL (ref 70–99)

## 2022-01-10 LAB — HIV ANTIBODY (ROUTINE TESTING W REFLEX): HIV Screen 4th Generation wRfx: NONREACTIVE

## 2022-01-10 LAB — MAGNESIUM: Magnesium: 1.7 mg/dL (ref 1.7–2.4)

## 2022-01-10 LAB — ETHANOL: Alcohol, Ethyl (B): <10 mg/dL (ref <10)

## 2022-01-10 LAB — AMMONIA
Ammonia: 117 umol/L — ABNORMAL HIGH (ref 9–35)
Ammonia: 64 umol/L — ABNORMAL HIGH (ref 9–35)

## 2022-01-10 LAB — CBG MONITORING, ED
Glucose-Capillary: 121 mg/dL — ABNORMAL HIGH (ref 70–99)
Glucose-Capillary: 110 mg/dL — ABNORMAL HIGH (ref 70–99)

## 2022-01-10 MED ORDER — SODIUM BICARBONATE 650 MG PO TABS
650.0000 mg | ORAL_TABLET | Freq: Two times a day (BID) | ORAL | Status: DC
  Administered 2022-01-12 – 2022-01-13 (×3): 650 mg via ORAL
  Filled 2022-01-10 (×6): qty 1

## 2022-01-10 MED ORDER — LACTULOSE 10 GM/15ML PO SOLN
30.0000 g | Freq: Three times a day (TID) | ORAL | Status: DC
  Administered 2022-01-12 – 2022-01-13 (×4): 30 g via ORAL
  Filled 2022-01-10 (×7): qty 45

## 2022-01-10 MED ORDER — PAROXETINE HCL 20 MG PO TABS
40.0000 mg | ORAL_TABLET | Freq: Every day | ORAL | Status: DC
  Administered 2022-01-12 – 2022-01-13 (×2): 40 mg via ORAL
  Filled 2022-01-10 (×4): qty 2

## 2022-01-10 MED ORDER — METOCLOPRAMIDE HCL 5 MG/ML IJ SOLN: 5.0000 mg | Freq: Four times a day (QID) | INTRAMUSCULAR | Status: DC | PRN

## 2022-01-10 MED ORDER — PROCHLORPERAZINE EDISYLATE 10 MG/2ML IJ SOLN: 10.0000 mg | Freq: Four times a day (QID) | INTRAMUSCULAR | Status: DC

## 2022-01-10 MED ORDER — RIFAXIMIN 550 MG PO TABS
550.0000 mg | ORAL_TABLET | Freq: Two times a day (BID) | ORAL | Status: DC
  Administered 2022-01-12 – 2022-01-13 (×3): 550 mg via ORAL
  Filled 2022-01-10 (×7): qty 1

## 2022-01-10 MED ORDER — INSULIN ASPART 100 UNIT/ML IJ SOLN: 0.0000 [IU] | Freq: Every day | INTRAMUSCULAR | Status: DC

## 2022-01-10 MED ORDER — ALBUTEROL SULFATE (2.5 MG/3ML) 0.083% IN NEBU: 2.5000 mg | INHALATION_SOLUTION | Freq: Four times a day (QID) | RESPIRATORY_TRACT | Status: DC | PRN

## 2022-01-10 MED ORDER — LACTULOSE ENEMA
300.0000 mL | Freq: Once | ORAL | Status: DC
  Filled 2022-01-10: qty 300

## 2022-01-10 MED ORDER — PROCHLORPERAZINE EDISYLATE 10 MG/2ML IJ SOLN: 10.0000 mg | INTRAMUSCULAR | Status: DC | PRN

## 2022-01-10 MED ORDER — HYDRALAZINE HCL 20 MG/ML IJ SOLN: 10.0000 mg | INTRAMUSCULAR | Status: DC | PRN

## 2022-01-10 MED ORDER — LACTULOSE 10 GM/15ML PO SOLN
30.0000 g | Freq: Once | ORAL | Status: AC
  Administered 2022-01-10: 30 g via ORAL
  Filled 2022-01-10: qty 45

## 2022-01-10 MED ORDER — NADOLOL 20 MG PO TABS
20.0000 mg | ORAL_TABLET | Freq: Every day | ORAL | Status: DC
  Administered 2022-01-12 – 2022-01-13 (×2): 20 mg via ORAL
  Filled 2022-01-10 (×4): qty 1

## 2022-01-10 MED ORDER — POTASSIUM CHLORIDE 10 MEQ/100ML IV SOLN
10.0000 meq | INTRAVENOUS | Status: AC
  Administered 2022-01-10 (×2): 10 meq via INTRAVENOUS
  Filled 2022-01-10 (×2): qty 100

## 2022-01-10 MED ORDER — ACETAMINOPHEN 325 MG PO TABS
650.0000 mg | ORAL_TABLET | Freq: Four times a day (QID) | ORAL | Status: DC | PRN
  Administered 2022-01-12 – 2022-01-13 (×3): 650 mg via ORAL
  Filled 2022-01-10 (×3): qty 2

## 2022-01-10 MED ORDER — INSULIN ASPART 100 UNIT/ML IJ SOLN: 0.0000 [IU] | INTRAMUSCULAR | Status: DC

## 2022-01-10 MED ORDER — HYDRALAZINE HCL 20 MG/ML IJ SOLN: 5.0000 mg | INTRAMUSCULAR | Status: DC | PRN

## 2022-01-10 MED ORDER — POTASSIUM CHLORIDE 10 MEQ/100ML IV SOLN
10.0000 meq | INTRAVENOUS | Status: AC
  Administered 2022-01-10: 10 meq via INTRAVENOUS
  Filled 2022-01-10: qty 100

## 2022-01-10 MED ORDER — LACTULOSE ENEMA
300.0000 mL | Freq: Once | ORAL | Status: AC
  Administered 2022-01-11: 300 mL via RECTAL
  Filled 2022-01-10: qty 300

## 2022-01-10 MED ORDER — INSULIN ASPART 100 UNIT/ML IJ SOLN
0.0000 [IU] | INTRAMUSCULAR | Status: DC
  Administered 2022-01-13: 2 [IU] via SUBCUTANEOUS

## 2022-01-10 MED ORDER — LACTATED RINGERS IV SOLN: INTRAVENOUS | Status: DC

## 2022-01-10 MED ORDER — ENOXAPARIN SODIUM 40 MG/0.4ML IJ SOSY
40.0000 mg | PREFILLED_SYRINGE | INTRAMUSCULAR | Status: DC
  Administered 2022-01-10 – 2022-01-13 (×4): 40 mg via SUBCUTANEOUS
  Filled 2022-01-10 (×4): qty 0.4

## 2022-01-10 MED ORDER — ACETAMINOPHEN 650 MG RE SUPP: 650.0000 mg | Freq: Four times a day (QID) | RECTAL | Status: DC | PRN

## 2022-01-10 MED ORDER — INSULIN GLARGINE-YFGN 100 UNIT/ML ~~LOC~~ SOLN
8.0000 [IU] | Freq: Every day | SUBCUTANEOUS | Status: DC
  Filled 2022-01-10: qty 0.08

## 2022-01-10 MED ORDER — FAMOTIDINE IN NACL 20-0.9 MG/50ML-% IV SOLN
20.0000 mg | Freq: Two times a day (BID) | INTRAVENOUS | Status: DC
  Administered 2022-01-10 – 2022-01-12 (×5): 20 mg via INTRAVENOUS
  Filled 2022-01-10 (×10): qty 50

## 2022-01-10 NOTE — ED Notes (Signed)
Resp at bedside at this time 

## 2022-01-10 NOTE — Progress Notes (Signed)
Pt NT suctioned per physician order. RT succesfully suctioned x1 down right nare without complication. RN x1 and NT x1 assisted. Pt suctioned for moderate amount of thick white frothy secretions.
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
## 2022-01-10 NOTE — ED Notes (Signed)
Unable to obtain PIV, ordered labs after multiple attempts. Phlebotomy to attempt.

## 2022-01-10 NOTE — ED Notes (Signed)
MD made aware that patient is very difficult stick and only able to obtain part of AM labs.

## 2022-01-10 NOTE — Evaluation (Signed)
Clinical/Bedside Swallow Evaluation Patient Details  Name: Judy Meza MRN: [id number] Date of Birth: July 17, 1960  Today's Date: 01/10/2022 Time: SLP Start Time (ACUTE ONLY): 62 SLP Stop Time (ACUTE ONLY): 1115 SLP Time Calculation (min) (ACUTE ONLY): 15 min  Past Medical History:  Past Medical History:  Diagnosis Date   Acute kidney failure ([place])    Anemia    Anxiety    Arthritis    Asthma    Blood transfusion without reported diagnosis    Chronic hepatitis C ([place])    Chronic kidney disease, stage 3 ([place])    Cirrhosis ([place])    COPD (chronic obstructive pulmonary disease) ([place])    COVID-19 virus infection    Depression    Diabetes mellitus without complication ([place])    I9S 7.2% 09/17/20   GERD (gastroesophageal reflux disease)    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   I   Hypertension    Liver disease    Normal pressure hydrocephalus ([facility])    Pneumonia    Renal disorder    Sleep apnea    uses O2 at night   Thrombocytopenia ([place])    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   Vaginal Pap smear, abnormal    Past Surgical History:  Past Surgical History:  Procedure Laterality Date   ESOPHAGEAL BANDING  10/04/2019   Procedure: ESOPHAGEAL BANDING;  Surgeon: Lavena Bullion, DO;  Location: [facility];  Service: Gastroenterology;;   ESOPHAGOGASTRODUODENOSCOPY (EGD) WITH PROPOFOL N/A 10/04/2019   Procedure: ESOPHAGOGASTRODUODENOSCOPY (EGD) WITH PROPOFOL;  Surgeon: Lavena Bullion, DO;  Location: [facility];  Service: Gastroenterology;  Laterality: N/A;   HOT HEMOSTASIS N/A 10/04/2019   Procedure: HOT HEMOSTASIS (ARGON PLASMA COAGULATION/BICAP);  Surgeon: Lavena Bullion, DO;  Location: [facility];  Service: Gastroenterology;  Laterality: N/A;   PORT-A-CATH REMOVAL     SKIN GRAFT     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/2020   Procedure: REPAIR OF TRACHEOCUTANEOIS FISTULA;  Surgeon: Izora Gala, MD;  Location: [facility] OR;   Service: ENT;  Laterality: N/A;   HPI:  Patient is a 61 y.o. female with PMH: COPD, GERD, hepatitis C, cirrhosis, anxiety, depression, HTN, chronic thrombocytopenia/pancytopenia, unspecified dementia, laryngeal CA s/p radiation and tracheostomy, complicated by tracheocutaneous fistula; CKD 4, , NPH, OSA. She presented to the ED on 01/10/22 from SNF for aggressive behavior and facility reporting that when her ammonia level is elevated this typically happens. She has been combative, aggressive, non-complaint with most interventions in ED.    Assessment / Plan / Recommendation  Clinical Impression  At this time, patient is presenting with a cognitive based dysphagia but as per MBS completed in 2021, she has a h/o minimal oral and mild pharyngo-cervical esophageal dysphagia due to h/o cancer s/p XRT and stoma. Patient would open eyes slightly and keep open very briefly to voice but she did not exhibit any instances of attempts to communicate verbally or non-verbally. She was slightly resistant to presentation to lips of toothette swab soaked in water but she then did engage in this, sucking some water. She did this a few times before completely refusing any other attempts. No swallow initiation palpated. SLP recommending continue NPO status and will plan to follow briefly for ability to take PO's but prognosis is guarded to fair. SLP Visit Diagnosis: Dysphagia, unspecified (R13.10)    Aspiration Risk  Moderate aspiration risk;Severe aspiration risk;Risk for inadequate nutrition/hydration    Diet Recommendation NPO   Medication Administration: Via alternative means  Other  Recommendations Oral Care Recommendations: Other (Comment) (oral care QID as tolerated)    Recommendations for follow up therapy are one component of a multi-disciplinary discharge planning process, led by the attending physician.  Recommendations may be updated based on patient status, additional functional criteria and insurance  authorization.  Follow up Recommendations Follow physician's recommendations for discharge plan and follow up therapies      Assistance Recommended at Discharge    Functional Status Assessment Patient has had a recent decline in their functional status and demonstrates the ability to make significant improvements in function in a reasonable and predictable amount of time.  Frequency and Duration min 1 x/week  1 week       Prognosis Prognosis for Safe Diet Advancement: Guarded Barriers to Reach Goals: Cognitive deficits;Behavior      Swallow Study   General Date of Onset: 01/10/22 HPI: Patient is a 61 y.o. female with PMH: COPD, GERD, hepatitis C, cirrhosis, anxiety, depression, HTN, chronic thrombocytopenia/pancytopenia, unspecified dementia, laryngeal CA s/p radiation and tracheostomy, complicated by tracheocutaneous fistula; CKD 4, , NPH, OSA. She presented to the ED on 01/10/22 from SNF for aggressive behavior and facility reporting that when her ammonia level is elevated this typically happens. She has been combative, aggressive, non-complaint with most interventions in ED. Type of Study: Bedside Swallow Evaluation Previous Swallow Assessment: BSE and MBS during admission 2021 Diet Prior to this Study: NPO Temperature Spikes Noted: No Respiratory Status: Room air History of Recent Intubation: No Behavior/Cognition: Requires cueing;Doesn't follow directions;Uncooperative Oral Cavity Assessment: Other (comment) (patient did not allow SLP to perform adequate oral care) Oral Care Completed by SLP: Other (Comment) (patient did not allow SLP to perform adequate oral care) Self-Feeding Abilities: Total assist;Refused PO Patient Positioning: Partially reclined Baseline Vocal Quality: Not observed Volitional Cough: Cognitively unable to elicit    Oral/Motor/Sensory Function Overall Oral Motor/Sensory Function: Other (comment) (patient did not allow for full oral motor examination but no  observed facial assymetry or droop)   Ice Chips     Thin Liquid Thin Liquid: Impaired Oral Phase Impairments: Poor awareness of bolus;Reduced lingual movement/coordination Other Comments: patient did suck at toothette swab soaked in water but exhibited very limited attempts to transit in oral cavity and no swallow palpated    Nectar Thick Nectar Thick Liquid: Not tested   Honey Thick Honey Thick Liquid: Not tested   Puree Puree: Not tested   Solid     Solid: Not tested      Sonia Baller, MA, CCC-SLP Speech Therapy

## 2022-01-10 NOTE — ED Notes (Signed)
Pt was incontinent onself. Pt was cleaned, gown changed, and sheets changed

## 2022-01-10 NOTE — ED Notes (Signed)
Admit provider is at bedside

## 2022-01-10 NOTE — Progress Notes (Signed)
Pt. Not alert to receive PO medication

## 2022-01-10 NOTE — Progress Notes (Signed)
Pt.O2 saturation 85 % room air 2L applied

## 2022-01-10 NOTE — ED Notes (Addendum)
When attempting to administer lactulose enema as ordered, pt became combative and aggressive, attempting to bite, scratch, hit, and kick this RN. Pt immediately falls asleep when staff is not attempting to provide care. Will notify MD.

## 2022-01-10 NOTE — ED Notes (Signed)
LFA IV removed d/t tenderness, leaking, and possible swelling noted. IV team to attempt another PIV.

## 2022-01-10 NOTE — Progress Notes (Signed)
Pt. Arrived to unit, lethargic, no verbal wording, response to pain/ movement with grunts. Copious yellow discharge from trach stoma requiring suction.

## 2022-01-10 NOTE — H&P (Addendum)
PCP:   Caprice Renshaw, MD   Chief Complaint:  Altered mentation  HPI: This is a 61 year old female with history of alcohol abuse, hepatitis C, cirrhosis, COPD, anxiety and depression, hypertension, chronic thrombocytopenia/pancytopenia, unspecified dementia, laryngeal CA s/p radiation and tracheostomy, complicated by tracheocutaneous fistula; CKD 4, , NPH, OSA. She resides at the nursing home and was sent in for aggressive behavior.  Per facility she gets like this when her ammonia level is elevated.  In the ER patient's ammonia level is 64.  Patient aggressively stimulated but otherwise mostly sleeps.   Review of Systems:  Unable to obtain secondary to acuity of illness.  Per facility, aggressive and encephalopathic  Past Medical History: Past Medical History:  Diagnosis Date   Acute kidney failure ([place])    Anemia    Anxiety    Arthritis    Asthma    Blood transfusion without reported diagnosis    Chronic hepatitis C ([place])    Chronic kidney disease, stage 3 (HCC)    Cirrhosis ([place])    COPD (chronic obstructive pulmonary disease) ([place])    COVID-19 virus infection    Depression    Diabetes mellitus without complication ([place])    N3Z 7.2% 09/17/20   GERD (gastroesophageal reflux disease)    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   I   Hypertension    Liver disease    Normal pressure hydrocephalus (HCC)    Pneumonia    Renal disorder    Sleep apnea    uses O2 at night   Thrombocytopenia ([place])    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   Vaginal Pap smear, abnormal    Past Surgical History:  Procedure Laterality Date   ESOPHAGEAL BANDING  10/04/2019   Procedure: ESOPHAGEAL BANDING;  Surgeon: Lavena Bullion, DO;  Location: [facility];  Service: Gastroenterology;;   ESOPHAGOGASTRODUODENOSCOPY (EGD) WITH PROPOFOL N/A 10/04/2019   Procedure: ESOPHAGOGASTRODUODENOSCOPY (EGD) WITH PROPOFOL;  Surgeon: Lavena Bullion, DO;  Location: [facility];   Service: Gastroenterology;  Laterality: N/A;   HOT HEMOSTASIS N/A 10/04/2019   Procedure: HOT HEMOSTASIS (ARGON PLASMA COAGULATION/BICAP);  Surgeon: Lavena Bullion, DO;  Location: [facility];  Service: Gastroenterology;  Laterality: N/A;   PORT-A-CATH REMOVAL     SKIN GRAFT     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/2020   Procedure: REPAIR OF TRACHEOCUTANEOIS FISTULA;  Surgeon: Izora Gala, MD;  Location: [facility];  Service: ENT;  Laterality: N/A;    Medications: Prior to Admission medications   Medication Sig Start Date End Date Taking? Authorizing Provider  acetaminophen (TYLENOL) 325 MG tablet Take 650 mg by mouth every 4 (four) hours as needed for mild pain or fever.    [provider]  albuterol (VENTOLIN HFA) 108 (90 Base) MCG/ACT inhaler Inhale 2 puffs into the lungs every 6 (six) hours as needed for shortness of breath.    [provider]  Boric Acid Vaginal (AZO BORIC ACID) 600 MG SUPP Place 1 suppository vaginally at bedtime. 11/27/21   Shelly Bombard, MD  Carboxymethylcellul-Glycerin (REFRESH OPTIVE) 1-0.9 % GEL Place 1 drop into both eyes 4 (four) times daily.    [provider]  diclofenac Sodium (VOLTAREN) 1 % GEL Apply 2 g topically 4 (four) times daily. Left ankle and leg 11/01/20   Geradine Girt, DO  diphenhydrAMINE-zinc acetate (BENADRYL ITCH STOPPING) cream Apply 1 application topically every 4 (four) hours as needed for itching.    [provider]  ergocalciferol (VITAMIN  D2) 1.25 MG (50000 UT) capsule Take 50,000 Units by mouth every Friday.    [provider]  famotidine (PEPCID) 20 MG tablet Take 20 mg by mouth daily.    [provider]  folic acid (FOLVITE) 1 MG tablet Take 1 tablet (1 mg total) by mouth daily. 10/09/19   Andrew Au, MD  gabapentin (NEURONTIN) 100 MG capsule Take 100 mg by mouth 3 (three) times daily.    [provider]  hydrocortisone 2.5 % cream Apply 1 application  topically See admin instructions. Apply to back of neck every shift.    [provider]  hydrOXYzine (ATARAX/VISTARIL) 25 MG tablet Take 25 mg by mouth every 6 (six) hours as needed for itching.    [provider]  insulin aspart (NOVOLOG) 100 UNIT/ML injection Inject 5 Units into the skin 3 (three) times daily with meals. If eating >50% of meals 11/01/20   Eulogio Bear U, DO  insulin aspart (NOVOLOG) 100 UNIT/ML injection Inject 0-15 Units into the skin 3 (three) times daily with meals. 11/01/20   Geradine Girt, DO  insulin glargine-yfgn (SEMGLEE) 100 UNIT/ML injection Inject 0.18 mLs (18 Units total) into the skin daily. 11/02/20   Geradine Girt, DO  ketotifen (ZADITOR) 0.025 % ophthalmic solution Place 1 drop into both eyes See admin instructions. Qd x 21 days    [provider]  lactulose (CHRONULAC) 10 GM/15ML solution Take 30 g by mouth in the morning, at noon, in the evening, and at bedtime.    [provider]  lidocaine (LIDODERM) 5 % Place 1 patch onto the skin daily. Remove & Discard patch within 12 hours or as directed by MD 11/02/20   Geradine Girt, DO  loratadine (CLARITIN) 10 MG tablet Take 10 mg by mouth daily.    [provider]  nadolol (CORGARD) 20 MG tablet Take 1 tablet (20 mg total) by mouth daily. 11/02/20   Geradine Girt, DO  Olopatadine HCl 0.2 % SOLN Place 1 drop into both eyes daily as needed (allergic conjunctivitis).    [provider]  ondansetron (ZOFRAN ODT) 8 MG disintegrating tablet Take 1 tablet (8 mg total) by mouth every 8 (eight) hours as needed for nausea or vomiting. 09/28/20   Izora Gala, MD  ondansetron (ZOFRAN) 8 MG tablet Take by mouth every 8 (eight) hours as needed for nausea or vomiting.    [provider]  OxyCODONE HCl, Abuse Deter, (OXAYDO) 5 MG TABA Take 5 mg by mouth every 6 (six) hours as needed.    [provider]  PARoxetine (PAXIL) 40 MG tablet Take 40 mg by mouth  daily.    [provider]  potassium chloride SA (KLOR-CON M) 20 MEQ tablet Take 20 mEq by mouth daily.    [provider]  rifaximin (XIFAXAN) 550 MG TABS tablet Take 1 tablet (550 mg total) by mouth 2 (two) times daily. 01/22/20   Cirigliano, Vito V, DO  Skin Protectants, Misc. (EUCERIN) cream Apply 1 application topically every 12 (twelve) hours as needed for dry skin.    [provider]  sodium bicarbonate 650 MG tablet Take 1 tablet (650 mg total) by mouth 2 (two) times daily. 08/09/19   Hosie Poisson, MD  thiamine 100 MG tablet Take 1 tablet (100 mg total) by mouth daily. 06/08/19   Rai, Vernelle Emerald, MD    Allergies:  No Known Allergies  Social History:  reports that she has been smoking cigarettes. She  has been smoking an average of .25 packs per day. She has never used smokeless tobacco. She reports that she does not currently use alcohol. She reports that she does not currently use drugs.  Family History: Family History  Problem Relation Age of Onset   Cancer Mother    Healthy Mother    Cancer Maternal Uncle    Cancer Maternal Grandmother    Hypertension Other     Physical Exam: Vitals:   01/09/22 2218 01/09/22 2330 01/09/22 2345 01/10/22 0030  BP:  (!) 163/98 (!) 162/100 (!) 189/108  Pulse:  72 73   SpO2: 93% 97% 95%     General:  Alert and oriented times three, well developed and nourished, no acute distress. Encephalopathy female, non verbal currently, shakes head appropriately in response yes or no questions  Eyes: PERRLA, pink conjunctiva, no scleral icterus ENT: Moist oral mucosa, neck supple, no thyromegaly, trach  Lungs: clear to ascultation, no wheeze, no crackles, no use of accessory muscles Cardiovascular: regular rate and rhythm, no regurgitation, no gallops, no murmurs. No carotid bruits, no JVD Abdomen: soft, positive BS, non-tender, non-distended, no organomegaly, not an acute abdomen GU: not examined Neuro: CN II - XII grossly  intact, sensation intact Musculoskeletal: strength 5/5 all extremities, no clubbing, cyanosis B/L LE edema/lymphedema, darkening skin color changes on left LLE Skin: no rash, no subcutaneous crepitation, no decubitus Psych: appropriate patient   Labs on Admission:  Recent Labs    01/09/22 2358 01/10/22 0122  NA 138 142  K 3.2* 3.7  CL 106  --   CO2 24  --   GLUCOSE 90  --   BUN 11  --   CREATININE 1.58*  --   CALCIUM 9.3  --    Recent Labs    01/09/22 2358  AST 46*  ALT 20  ALKPHOS 114  BILITOT 2.1*  PROT 8.2*  ALBUMIN 2.8*    Recent Labs    01/09/22 2358 01/10/22 0122  WBC 2.9*  --   NEUTROABS 1.5*  --   HGB 8.8* 10.2*  HCT 29.1* 30.0*  MCV 84.6  --   PLT 101*  --     Radiological Exams on Admission: CT HEAD WO CONTRAST (5MM)  Result Date: 01/10/2022 CLINICAL DATA:  Altered mental status. EXAM: CT HEAD WITHOUT CONTRAST TECHNIQUE: Contiguous axial images were obtained from the base of the skull through the vertex without intravenous contrast. RADIATION DOSE REDUCTION: This exam was performed according to the departmental dose-optimization program which includes automated exposure control, adjustment of the mA and/or kV according to patient size and/or use of iterative reconstruction technique. COMPARISON:  November 11, 2019 FINDINGS: Brain: There is no evidence of acute infarction, hemorrhage, hydrocephalus, extra-axial collection or mass lesion/mass effect. There are areas of decreased attenuation within the white matter tracts of the supratentorial brain, consistent with microvascular disease changes. Vascular: No hyperdense vessel or unexpected calcification. Skull: Normal. Negative for fracture or focal lesion. Sinuses/Orbits: No acute finding. Other: None. IMPRESSION: 1. No acute intracranial abnormality. 2. 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 Electronically Signed   By: Virgina Norfolk M.D.   On: 01/10/2022 02:39   DG Chest Port 1  View  Result Date: 01/09/2022 CLINICAL DATA:  Altered mental status. EXAM: PORTABLE CHEST 1 VIEW COMPARISON:  October 28, 2020 FINDINGS: The heart size and mediastinal contours are within normal limits. There is tortuosity of the descending thoracic aorta. Mild atelectasis is seen within the left lung base. There is  no evidence of a pleural effusion or pneumothorax. The visualized skeletal structures are unremarkable. IMPRESSION: Mild left basilar atelectasis. Electronically Signed   By: Virgina Norfolk M.D.   On: 01/09/2022 22:48    Assessment/Plan Present on Admission:  Acute hepatic encephalopathy ([place]) -Admit to MedSurg -Lactulose 30 mg p.o. 3 times daily.  Patient has taken roughly 1-1/2 dosage in the ER.  Her first instinct is to refused the medication.  For now reluctant to place a G-tube given her tendency to fight/resist.  Monitor -Rifaximin resumed -Ammonia level in a.m. -Will order UA.  Chest x-ray normal -Holding Benadryl, Neurontin, Atarax -Restraints have been ordered.  Patient received Ativan 1 mg x 1 in ER.  Not ordered as patient will not take meds/lactulose if more sleep  Diabetes mellitus -50% of Lantus given, 8 units as patient currently not likely to eat.  Sliding scale insulin every 4 hours   CKD (chronic kidney disease) stage 4, GFR 15-29 ml/min (HCC) -Stable, with stated baseline   Thrombocytopenia (HCC)/ Pancytopenia, acquired (HCC)/ Secondary esophageal varices (HCC)/liver cirrhosis -Secondary to liver cirrhosis.  INR ordered -Lovenox for DVT prophylaxis.  Platelets 101 today.  CBC in a.m.  Chronic pruritus -For now Vistaril and Benadryl on hold.  Idamae Lusher care by nursing.  Trach care order set in place  History of hyponatremia -On sodium tabs, continue.  BMP in a.m.   Dementia without behavioral disturbance (HCC)/anxiety and depression -Aware.  Resume patient's Paxil.   Chronic hepatitis C ([place]) -Very mild elevations.  CMP in a.m.   COPD with  chronic bronchitis -Nebulizers.   Tobacco abuse -Nicotine patch   QT prolongation -QTc 560 -Avoid QTc prolongation agents -Will add a magnesium level.  Will give additional potassium to keep close 4  Jonni Oelkers 01/10/2022, 3:36 AM

## 2022-01-10 NOTE — Progress Notes (Signed)
Patient suctioned through stoma for large amounts of foul smelling, thick, yellow secretions. Patient has strong cough. No adverse reactions to suctioning were noted. RN at bedside.

## 2022-01-10 NOTE — Progress Notes (Signed)
PT Cancellation Note  Patient Details Name: Judy Meza MRN: [id number] DOB: 11-27-1960   Cancelled Treatment:    Reason Eval/Treat Not Completed: Other (comment) Pt combative and aggressive with ED staff.  Wyona Almas, PT, DPT Acute Rehabilitation Services Office [place] 01/10/2022, 11:32 AM

## 2022-01-11 DIAGNOSIS — K7682 Hepatic encephalopathy: Secondary | ICD-10-CM | POA: Diagnosis not present

## 2022-01-11 LAB — CBC WITH DIFFERENTIAL/PLATELET
Abs Immature Granulocytes: 0.03 10*3/uL (ref 0.00–0.07)
Basophils Absolute: 0 10*3/uL (ref 0.0–0.1)
Basophils Relative: 1 %
Eosinophils Absolute: 0.1 10*3/uL (ref 0.0–0.5)
Eosinophils Relative: 1 %
HCT: 25.6 % — ABNORMAL LOW (ref 36.0–46.0)
Hemoglobin: 7.9 g/dL — ABNORMAL LOW (ref 12.0–15.0)
Immature Granulocytes: 1 %
Lymphocytes Relative: 20 %
Lymphs Abs: 1.3 10*3/uL (ref 0.7–4.0)
MCH: 26 pg (ref 26.0–34.0)
MCHC: 30.9 g/dL (ref 30.0–36.0)
MCV: 84.2 fL (ref 80.0–100.0)
Monocytes Absolute: 0.6 10*3/uL (ref 0.1–1.0)
Monocytes Relative: 9 %
Neutro Abs: 4.6 10*3/uL (ref 1.7–7.7)
Neutrophils Relative %: 68 %
Platelets: 107 10*3/uL — ABNORMAL LOW (ref 150–400)
RBC: 3.04 MIL/uL — ABNORMAL LOW (ref 3.87–5.11)
RDW: 18.5 % — ABNORMAL HIGH (ref 11.5–15.5)
WBC: 6.6 10*3/uL (ref 4.0–10.5)
nRBC: 0 % (ref 0.0–0.2)

## 2022-01-11 LAB — COMPREHENSIVE METABOLIC PANEL
ALT: 22 U/L (ref 0–44)
AST: 41 U/L (ref 15–41)
Albumin: 2.1 g/dL — ABNORMAL LOW (ref 3.5–5.0)
Alkaline Phosphatase: 88 U/L (ref 38–126)
Anion gap: 12 (ref 5–15)
BUN: 14 mg/dL (ref 6–20)
CO2: 22 mmol/L (ref 22–32)
Calcium: 8.7 mg/dL — ABNORMAL LOW (ref 8.9–10.3)
Chloride: 105 mmol/L (ref 98–111)
Creatinine, Ser: 1.33 mg/dL — ABNORMAL HIGH (ref 0.44–1.00)
GFR, Estimated: 46 mL/min — ABNORMAL LOW (ref >60)
Glucose, Bld: 83 mg/dL (ref 70–99)
Potassium: 3.9 mmol/L (ref 3.5–5.1)
Sodium: 139 mmol/L (ref 135–145)
Total Bilirubin: 2.2 mg/dL — ABNORMAL HIGH (ref 0.3–1.2)
Total Protein: 6 g/dL — ABNORMAL LOW (ref 6.5–8.1)

## 2022-01-11 LAB — GLUCOSE, CAPILLARY
Glucose-Capillary: 100 mg/dL — ABNORMAL HIGH (ref 70–99)
Glucose-Capillary: 74 mg/dL (ref 70–99)
Glucose-Capillary: 80 mg/dL (ref 70–99)
Glucose-Capillary: 85 mg/dL (ref 70–99)
Glucose-Capillary: 89 mg/dL (ref 70–99)
Glucose-Capillary: 94 mg/dL (ref 70–99)

## 2022-01-11 MED ORDER — LACTULOSE ENEMA
300.0000 mL | Freq: Once | ORAL | Status: AC
  Administered 2022-01-11: 300 mL via RECTAL
  Filled 2022-01-11: qty 300

## 2022-01-11 MED ORDER — LORATADINE 10 MG PO TABS
10.0000 mg | ORAL_TABLET | Freq: Every day | ORAL | Status: DC
  Administered 2022-01-12 – 2022-01-13 (×2): 10 mg via ORAL
  Filled 2022-01-11 (×2): qty 1

## 2022-01-11 MED ORDER — DEXTROSE IN LACTATED RINGERS 5 % IV SOLN: INTRAVENOUS | Status: DC

## 2022-01-11 MED ORDER — HYDRALAZINE HCL 20 MG/ML IJ SOLN
10.0000 mg | INTRAMUSCULAR | Status: DC | PRN
  Administered 2022-01-11 – 2022-01-12 (×2): 10 mg via INTRAVENOUS
  Filled 2022-01-11 (×2): qty 1

## 2022-01-11 NOTE — Progress Notes (Signed)
SLP Cancellation Note  Patient Details Name: KINZEY SHERIFF MRN: [id number] DOB: Dec 24, 1960   Cancelled treatment:        Attempted to see pt for ongoing swallowing assessment.  Spoke with RN.  Pt is extremely lethargic and is unable to take POs at this time.  SLP will reattempt with improved alertness   Celedonio Savage, [place], [place] Office: [phone] 01/11/2022, 11:16 AM
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
## 2022-01-11 NOTE — Progress Notes (Signed)
Patient is unable to participate with care as well as take PO meds due to lethargy/encephalopathy.  Dr. Pietro Cassis made aware.

## 2022-01-12 DIAGNOSIS — K7682 Hepatic encephalopathy: Secondary | ICD-10-CM | POA: Diagnosis not present

## 2022-01-12 LAB — GLUCOSE, CAPILLARY
Glucose-Capillary: 101 mg/dL — ABNORMAL HIGH (ref 70–99)
Glucose-Capillary: 104 mg/dL — ABNORMAL HIGH (ref 70–99)
Glucose-Capillary: 104 mg/dL — ABNORMAL HIGH (ref 70–99)
Glucose-Capillary: 114 mg/dL — ABNORMAL HIGH (ref 70–99)
Glucose-Capillary: 117 mg/dL — ABNORMAL HIGH (ref 70–99)
Glucose-Capillary: 123 mg/dL — ABNORMAL HIGH (ref 70–99)
Glucose-Capillary: 130 mg/dL — ABNORMAL HIGH (ref 70–99)

## 2022-01-12 MED ORDER — HYDROCERIN EX CREA: 1.0000 | TOPICAL_CREAM | Freq: Two times a day (BID) | CUTANEOUS | Status: DC | PRN

## 2022-01-12 MED ORDER — DEXTROSE IN LACTATED RINGERS 5 % IV SOLN: INTRAVENOUS | Status: DC

## 2022-01-12 MED ORDER — KETOCONAZOLE 2 % EX SHAM
1.0000 | MEDICATED_SHAMPOO | CUTANEOUS | Status: DC
  Filled 2022-01-12: qty 120

## 2022-01-12 MED ORDER — CLOBETASOL PROPIONATE 0.05 % EX OINT
1.0000 | TOPICAL_OINTMENT | Freq: Every day | CUTANEOUS | Status: DC | PRN
  Filled 2022-01-12: qty 15

## 2022-01-12 MED ORDER — GABAPENTIN 100 MG PO CAPS
100.0000 mg | ORAL_CAPSULE | Freq: Three times a day (TID) | ORAL | Status: DC
  Administered 2022-01-12 – 2022-01-13 (×4): 100 mg via ORAL
  Filled 2022-01-12 (×4): qty 1

## 2022-01-12 MED ORDER — HYDROCORTISONE 1 % EX CREA
1.0000 | TOPICAL_CREAM | Freq: Two times a day (BID) | CUTANEOUS | Status: DC
  Administered 2022-01-12 – 2022-01-13 (×2): 1 via TOPICAL
  Filled 2022-01-12 (×2): qty 28

## 2022-01-12 MED ORDER — POLYVINYL ALCOHOL 1.4 % OP SOLN
1.0000 [drp] | Freq: Four times a day (QID) | OPHTHALMIC | Status: DC
  Administered 2022-01-12 – 2022-01-13 (×4): 1 [drp] via OPHTHALMIC
  Filled 2022-01-12 (×2): qty 15

## 2022-01-12 NOTE — Progress Notes (Signed)
Pt has had increased level of alertness and has been calm and cooperative throughout shift.
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
## 2022-01-13 ENCOUNTER — Inpatient Hospital Stay (HOSPITAL_COMMUNITY): Payer: Medicaid Other

## 2022-01-13 DIAGNOSIS — K7682 Hepatic encephalopathy: Secondary | ICD-10-CM | POA: Diagnosis not present

## 2022-01-13 LAB — GLUCOSE, CAPILLARY
Glucose-Capillary: 97 mg/dL (ref 70–99)
Glucose-Capillary: 90 mg/dL (ref 70–99)
Glucose-Capillary: 103 mg/dL — ABNORMAL HIGH (ref 70–99)
Glucose-Capillary: 148 mg/dL — ABNORMAL HIGH (ref 70–99)
Glucose-Capillary: 135 mg/dL — ABNORMAL HIGH (ref 70–99)

## 2022-01-13 NOTE — Progress Notes (Signed)
RN spoke with Dr. Pietro Cassis and family in regard to plan of care. Per Dr. Pietro Cassis, for paitent to be ordered regular diet

## 2022-01-13 NOTE — Progress Notes (Signed)
Patient refusing IVF at this time. Dr. Pietro Cassis made aware. Per Dr. Pietro Cassis, to d/c IVF order

## 2022-01-13 NOTE — Progress Notes (Signed)
Modified Barium Swallow Progress Note  Patient Details  Name: Judy Meza MRN: [id number] Date of Birth: [birth year]  Today's Date: 01/13/2022  Modified Barium Swallow completed.  Full report located under Chart Review in the Imaging Section.  Brief recommendations include the following:  Clinical Impression  Pt exhibts mild oral and a moderate-severe pharyngeal phase swallow in setting of remote laryngeal cancer and radiation. Pt has a tracheal fistula from history of tracheostomy tube. She has reduced tongue base propulsion and strength resulting in delayed transit and mild residue. Majority of pharyngeal phase ROM and strength are significantly reduced including tongue base retraction, pharyngeal stripping wave, laryngeal elevation and closure. There was silent aspiration of nectar and thin liquids with laryngeal penetration of honey and puree barium with visible barium spilling from open stoma. Epigottis does not invert leaving mod-max residue with honey and puree in addition to decreased tongue base retraction. A head turn to her right slightly decreased residue with honey but not with puree. It is recommended that pt have alternate means of nutrition however in discussion with MD, pt/family may be open to pt accepting aspiration risk. MD to discuss options with family re: PO. ST will follow up on decision from family and will follow up accordingly.   Swallow Evaluation Recommendations       SLP Diet Recommendations: NPO- see Impressions -        Medication Administration: Via alternative means               Oral Care Recommendations: Oral care QID        [place] Siren 01/13/2022,10:57 AM

## 2022-01-13 NOTE — TOC Initial Note (Addendum)
Transition of Care [facility]) - Initial/Assessment Note    Patient Details  Name: Judy Meza MRN: [id number] Date of Birth: 1960/08/20  Transition of Care [facility]) CM/SW Contact:    Joanne Chars, LCSW Phone Number: 01/13/2022, 2:28 PM  Clinical Narrative:  CSW met with pt after being informed pt clear for DC back to [facility].  Pt confirms she is LTC at [facility] and is agreeable to return.  Pt here for AMS but oriented x 4 at this time.  Pt identifies sister Shirlean Mylar as her legal guardian and gives permission for CSW to speak with her.  CSW spoke with Shirlean Mylar, who was at [facility] currently getting some clothes for pt to where while being transported.  Robin in agreement with plan to return to [place].  Shirlean Mylar confirms that she and her sister [name] are legal guardians for pt since 2021, does report they went to court for a hearing.  She has guardianship paperwork in her car and will return to [facility] hospital shortly and can bring the paperwork.  CSW spoke with [facility] central intake, who confirms pt is LTC resident and confirm.  CSW shared PT recommendation for rehab.                 1450: sister Shirlean Mylar provided copy of legal guardianship order listing Maebelle Munroe as guardian 1 and Quaneshia Wareing as guardian 2.  Form placed in chart.    Expected Discharge Plan: Long Term Nursing Home Barriers to Discharge: No Barriers Identified   Patient Goals and CMS Choice     Choice offered to / list presented to : [facility] POA / Guardian, Patient (sister Diavian Furgason)      Expected Discharge Plan and Services In-house Referral: Clinical Social Work   Post Acute Care Choice: [place] Living arrangements for the past 2 months: [place] Expected Discharge Date: 01/13/22                                    Prior Living Arrangements/Services Living arrangements for the past 2 months: [place] Lives with:: Facility Resident Patient  language and need for interpreter reviewed:: Yes Do you feel safe going back to the place where you live?: Yes      Need for Family Participation in Patient Care: Yes (Comment) Care giver support system in place?: Yes (comment) Current home services: Other (comment) (na) Criminal Activity/Legal Involvement Pertinent to Current Situation/Hospitalization: No - Comment as needed  Activities of Daily Living      Permission Sought/Granted Permission sought to share information with : Family Supports Permission granted to share information with : Yes, Verbal Permission Granted  Share Information with NAME: sister Shirlean Mylar  Permission granted to share info w AGENCY: [facility]        Emotional Assessment Appearance:: Appears older than stated age Attitude/Demeanor/Rapport: Engaged Affect (typically observed): Appropriate, Pleasant Orientation: : Oriented to Self, Oriented to Place, Oriented to  Time, Oriented to Situation      Admission diagnosis:  Acute hepatic encephalopathy ([place]) [K76.82] Patient Active Problem List   Diagnosis Date Noted   Hyperosmolar hyperglycemic state (HHS) ([place]) 10/28/2020   Tracheo-cutaneous fistula 09/27/2020   End stage liver disease ([place]) 11/11/2019   Secondary esophageal varices with bleeding (HCC)    Esophageal stricture    Cirrhosis ([place])    Decompensated hepatic cirrhosis ([place]) 10/03/2019   Acute on chronic anemia  10/03/2019   Cancer screening 08/14/2019   Back pain with radiculopathy 08/07/2019   Recurrent falls 08/04/2019   Multiple falls at home 08/03/2019   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   Goals of care, counseling/discussion 07/28/2019   Acute encephalopathy 06/05/2019   Chest pain 06/04/2019   QT prolongation 44/46/1901   Metabolic acidosis 22/24/1146   Dementia without behavioral disturbance ([place]) 05/10/2019   Chronic hepatitis C ([place]) 05/10/2019   Hepatic encephalopathy ([place]) 05/09/2019   Pancytopenia, acquired ([place])     Cirrhosis of liver ([place])    CKD (chronic kidney disease) stage 4, GFR 15-29 ml/min ([place]) 04/29/2019   COPD with chronic bronchitis 04/29/2019   Tobacco abuse 04/29/2019   Alcoholism in remission ([place]) 04/29/2019   Acute hepatic encephalopathy ([place]) 04/29/2019   AKI (acute kidney injury) ([place]) 43/14/2767   Alcoholic cirrhosis of liver with ascites ([place]) 04/29/2019   Thrombocytopenia ([place]) 04/29/2019   Hyperkalemia 04/29/2019   PCP:  Caprice Renshaw, MD Pharmacy:   [facility] [phone] - [address], [place] [address] Phone: [phone] Fax: [phone]  [place], [place] [address] Phone: [phone] Fax: [place], [place] - [address] [place] [address] Phone: [phone] Fax: [phone]     Social Determinants of Health (SDOH) Social History: SDOH Screenings   Depression [id number]): Medium Risk (11/25/2021)  Tobacco Use: High Risk (01/10/2022)   SDOH Interventions:     Readmission Risk Interventions    10/05/2019    4:04 PM 08/07/2019   10:55 AM 06/06/2019    1:55 PM  Readmission Risk Prevention Plan  Transportation Screening Complete Complete Complete  HRI or [place]  Not Complete   HRI or Home Care Consult comments  needs SNF   Social Work Consult for [place] Planning/Counseling  Complete   Palliative Care Screening  Not Applicable   Medication Review Press photographer) Complete Complete Complete  PCP or Specialist appointment within 3-5 days of discharge Complete    HRI or Home Care Consult Complete  Complete  SW Recovery Care/Counseling Consult Complete  Complete  Palliative Care Screening Not Applicable  Not [place] Not Applicable  Complete
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
## 2022-01-13 NOTE — NC FL2 (Signed)
[place] LEVEL OF CARE FORM     IDENTIFICATION  Patient Name: Judy Meza Birthdate: [birth year] Sex: female Admission Date (Current Location): 01/09/2022  [place] and [place] Number:  Kathleen Argue [id number] [place] and Address:  The . 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   End stage liver disease ([place]) 11/11/2019   Secondary esophageal varices with bleeding ([place])    Esophageal stricture    Cirrhosis ([place])    Decompensated hepatic cirrhosis ([place]) 10/03/2019   Acute on chronic anemia 10/03/2019   Cancer screening 08/14/2019   Back pain with radiculopathy 08/07/2019   Recurrent falls 08/04/2019   Multiple falls at home 08/03/2019   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   Goals of care, counseling/discussion 07/28/2019   Acute encephalopathy 06/05/2019   Chest pain 06/04/2019   QT prolongation 53/61/4431   Metabolic acidosis 54/00/8676   Dementia without behavioral disturbance ([place]) 05/10/2019   Chronic hepatitis C ([place]) 05/10/2019   Hepatic encephalopathy ([place]) 05/09/2019   Pancytopenia, acquired ([place])    Cirrhosis of liver (HCC)    CKD (chronic kidney disease) stage 4, GFR 15-29 ml/min ([place]) 04/29/2019   COPD with chronic bronchitis 04/29/2019   Tobacco abuse 04/29/2019   Alcoholism in remission ([place]) 04/29/2019   Acute hepatic encephalopathy ([place]) 04/29/2019   AKI (acute kidney injury) ([place])  19/50/9326   Alcoholic cirrhosis of liver with ascites ([place]) 04/29/2019   Thrombocytopenia ([place]) 04/29/2019   Hyperkalemia 04/29/2019    Orientation RESPIRATION BLADDER Height & Weight     Self, Time, Situation, Place  Tracheostomy Incontinent, External catheter Weight:   Height:     BEHAVIORAL SYMPTOMS/MOOD NEUROLOGICAL BOWEL NUTRITION STATUS      Incontinent Diet (see discharge summary)  AMBULATORY STATUS COMMUNICATION OF NEEDS Skin   Total Care Verbally Normal                       Personal Care Assistance Level of Assistance  Bathing, Feeding, Dressing, Total care Bathing Assistance: Maximum assistance Feeding assistance: Limited assistance Dressing Assistance: Maximum assistance Total Care Assistance: Maximum assistance   Functional Limitations Info  Sight, Hearing, Speech Sight Info: Adequate Hearing Info: Adequate Speech Info: Adequate    SPECIAL CARE FACTORS FREQUENCY  PT (By licensed PT), OT (By licensed OT)     PT Frequency: evaluate and treat OT Frequency: evaluate and treat            Contractures Contractures Info: Not present    Additional Factors Info  Code Status, Allergies, Insulin Sliding Scale Code Status Info: DNR Allergies Info: NKA   Insulin Sliding Scale Info: Novolog: see discharge summary       Current Medications (01/13/2022):  This is the current hospital active medication list Current Facility-Administered Medications  Medication Dose Route Frequency Provider Last Rate Last Admin   acetaminophen (TYLENOL) tablet 650 mg  650 mg Oral Q6H PRN Quintella Baton, MD   650 mg at 01/13/22 [phone]  Or   acetaminophen (TYLENOL) suppository 650 mg  650 mg Rectal Q6H PRN Crosley, Debby, MD       albuterol (PROVENTIL) (2.5 MG/3ML) 0.083% nebulizer solution 2.5 mg  2.5 mg Inhalation Q6H PRN Crosley, Debby, MD       clobetasol ointment (TEMOVATE) 1.61 % 1 Application  1 Application Topical Daily PRN Dahal, Binaya, MD       dextrose 5 % in  lactated ringers infusion   Intravenous Continuous Dahal, Binaya, MD 50 mL/hr at 01/13/22 1019 New Bag at 01/13/22 1019   enoxaparin (LOVENOX) injection 40 mg  40 mg Subcutaneous Q24H Crosley, Debby, MD   40 mg at 01/13/22 0843   famotidine (PEPCID) IVPB 20 mg premix  20 mg Intravenous Q12H Crosley, Debby, MD 100 mL/hr at 01/12/22 1622 20 mg at 01/12/22 1622   gabapentin (NEURONTIN) capsule 100 mg  100 mg Oral TID Terrilee Croak, MD   100 mg at 01/13/22 0842   hydrALAZINE (APRESOLINE) injection 10 mg  10 mg Intravenous Q4H PRN Dahal, Marlowe Aschoff, MD   10 mg at 01/12/22 0108   hydrocerin (EUCERIN) cream 1 Application  1 Application Topical W96E PRN Dahal, Marlowe Aschoff, MD       hydrocortisone cream 1 % 1 Application  1 Application Topical A54U Dahal, Binaya, MD   1 Application at 98/11/91 0843   insulin aspart (novoLOG) injection 0-15 Units  0-15 Units Subcutaneous Q4H Raenette Rover, NP       ketoconazole (NIZORAL) 2 % shampoo 1 Application  1 Application Topical Once per day on Mon Wed Fri Dahal, Marlowe Aschoff, MD       lactulose (CHRONULAC) 10 GM/15ML solution 30 g  30 g Oral TID Quintella Baton, MD   30 g at 01/13/22 0842   loratadine (CLARITIN) tablet 10 mg  10 mg Oral Daily Dahal, Marlowe Aschoff, MD   10 mg at 01/13/22 0841   nadolol (CORGARD) tablet 20 mg  20 mg Oral Daily Crosley, Debby, MD   20 mg at 01/13/22 0841   PARoxetine (PAXIL) tablet 40 mg  40 mg Oral Daily Crosley, Debby, MD   40 mg at 01/13/22 0841   polyvinyl alcohol (LIQUIFILM TEARS) 1.4 % ophthalmic solution 1 drop  1 drop Both Eyes QID Dahal, Marlowe Aschoff, MD   1 drop at 01/13/22 1410   prochlorperazine (COMPAZINE) injection 10 mg  10 mg Intravenous Q4H PRN Crosley, Debby, MD       rifaximin (XIFAXAN) tablet 550 mg  550 mg Oral BID Crosley, Debby, MD   550 mg at 01/13/22 4782   sodium bicarbonate tablet 650 mg  650 mg Oral BID Quintella Baton, MD   650 mg at 01/13/22 9562     Discharge Medications: Please see discharge summary for a list of discharge  medications.  Relevant Imaging Results:  Relevant Lab Results:   Additional Information SSN [id number].  Joanne Chars, LCSW

## 2022-01-13 NOTE — Progress Notes (Signed)
Report given to [facility] LPN at [facility]

## 2022-01-13 NOTE — TOC Transition Note (Signed)
Transition of Care [facility]) - CM/SW Discharge Note   Patient Details  Name: Judy Meza MRN: [id number] Date of Birth: 13-Mar-1960  Transition of [facility]) CM/SW Contact:  Joanne Chars, LCSW Phone Number: 01/13/2022, 2:41 PM   Clinical Narrative:   Pt discharging to [facility].  RN call report to [phone].      Final next level of care: Skilled Nursing Facility Barriers to Discharge: No Barriers Identified   Patient Goals and CMS Choice   Choice offered to / list presented to : [facility] POA / Guardian, Patient (sister [name])  Discharge Placement                Patient chooses bed at:  [facility]) Patient to be transferred to facility by: [place] Name of family member notified: sister Shirlean Mylar Patient and family notified of of transfer: 01/13/22  Discharge Plan and Services Additional resources added to the After Visit Summary for   In-house Referral: Clinical Social Work   Post Acute Care Choice: [place]                               Social Determinants of Health (SDOH) Interventions SDOH Screenings   Depression (PHQ2-9): Medium Risk (11/25/2021)  Tobacco Use: High Risk (01/10/2022)     Readmission Risk Interventions    10/05/2019    4:04 PM 08/07/2019   10:55 AM 06/06/2019    1:55 PM  Readmission Risk Prevention Plan  Transportation Screening Complete Complete Complete  HRI or [place]  Not Complete   HRI or Home Care Consult comments  needs SNF   Social Work Consult for [place] Planning/Counseling  Complete   Palliative Care Screening  Not Applicable   Medication Review Press photographer) Complete Complete Complete  PCP or Specialist appointment within 3-5 days of discharge Complete    HRI or Home Care Consult Complete  Complete  SW Recovery Care/Counseling Consult Complete  Complete  Palliative Care Screening Not Applicable  Not [place] Not Applicable  Complete

## 2022-01-27 ENCOUNTER — Emergency Department (HOSPITAL_COMMUNITY): Payer: Medicaid Other

## 2022-01-27 ENCOUNTER — Other Ambulatory Visit: Payer: Self-pay

## 2022-01-27 ENCOUNTER — Inpatient Hospital Stay (HOSPITAL_COMMUNITY)
Admission: EM | Admit: 2022-01-27 | Discharge: 2022-02-12 | DRG: 441 | Disposition: E | Payer: Medicaid Other | Source: Skilled Nursing Facility | Attending: Internal Medicine | Admitting: Internal Medicine

## 2022-01-27 ENCOUNTER — Encounter (HOSPITAL_COMMUNITY): Payer: Self-pay

## 2022-01-27 DIAGNOSIS — R6510 Systemic inflammatory response syndrome (SIRS) of non-infectious origin without acute organ dysfunction: Secondary | ICD-10-CM | POA: Diagnosis present

## 2022-01-27 DIAGNOSIS — D62 Acute posthemorrhagic anemia: Secondary | ICD-10-CM | POA: Diagnosis present

## 2022-01-27 DIAGNOSIS — N390 Urinary tract infection, site not specified: Secondary | ICD-10-CM | POA: Diagnosis present

## 2022-01-27 DIAGNOSIS — J860 Pyothorax with fistula: Secondary | ICD-10-CM | POA: Diagnosis present

## 2022-01-27 DIAGNOSIS — R4182 Altered mental status, unspecified: Secondary | ICD-10-CM | POA: Diagnosis not present

## 2022-01-27 DIAGNOSIS — D649 Anemia, unspecified: Secondary | ICD-10-CM | POA: Diagnosis present

## 2022-01-27 DIAGNOSIS — K922 Gastrointestinal hemorrhage, unspecified: Secondary | ICD-10-CM | POA: Diagnosis not present

## 2022-01-27 DIAGNOSIS — R64 Cachexia: Secondary | ICD-10-CM | POA: Diagnosis present

## 2022-01-27 DIAGNOSIS — I130 Hypertensive heart and chronic kidney disease with heart failure and stage 1 through stage 4 chronic kidney disease, or unspecified chronic kidney disease: Secondary | ICD-10-CM | POA: Diagnosis present

## 2022-01-27 DIAGNOSIS — J449 Chronic obstructive pulmonary disease, unspecified: Secondary | ICD-10-CM | POA: Diagnosis not present

## 2022-01-27 DIAGNOSIS — N179 Acute kidney failure, unspecified: Secondary | ICD-10-CM | POA: Diagnosis present

## 2022-01-27 DIAGNOSIS — E872 Acidosis, unspecified: Secondary | ICD-10-CM | POA: Diagnosis present

## 2022-01-27 DIAGNOSIS — Z79899 Other long term (current) drug therapy: Secondary | ICD-10-CM

## 2022-01-27 DIAGNOSIS — D696 Thrombocytopenia, unspecified: Secondary | ICD-10-CM | POA: Diagnosis present

## 2022-01-27 DIAGNOSIS — F0394 Unspecified dementia, unspecified severity, with anxiety: Secondary | ICD-10-CM | POA: Diagnosis present

## 2022-01-27 DIAGNOSIS — R1310 Dysphagia, unspecified: Secondary | ICD-10-CM | POA: Diagnosis present

## 2022-01-27 DIAGNOSIS — D689 Coagulation defect, unspecified: Secondary | ICD-10-CM | POA: Diagnosis present

## 2022-01-27 DIAGNOSIS — K3189 Other diseases of stomach and duodenum: Secondary | ICD-10-CM | POA: Diagnosis present

## 2022-01-27 DIAGNOSIS — Z8521 Personal history of malignant neoplasm of larynx: Secondary | ICD-10-CM

## 2022-01-27 DIAGNOSIS — Z789 Other specified health status: Secondary | ICD-10-CM | POA: Diagnosis not present

## 2022-01-27 DIAGNOSIS — K921 Melena: Secondary | ICD-10-CM | POA: Diagnosis present

## 2022-01-27 DIAGNOSIS — K7031 Alcoholic cirrhosis of liver with ascites: Secondary | ICD-10-CM | POA: Diagnosis present

## 2022-01-27 DIAGNOSIS — E1122 Type 2 diabetes mellitus with diabetic chronic kidney disease: Secondary | ICD-10-CM | POA: Diagnosis present

## 2022-01-27 DIAGNOSIS — E860 Dehydration: Principal | ICD-10-CM | POA: Diagnosis present

## 2022-01-27 DIAGNOSIS — G4733 Obstructive sleep apnea (adult) (pediatric): Secondary | ICD-10-CM | POA: Diagnosis present

## 2022-01-27 DIAGNOSIS — K7682 Hepatic encephalopathy: Principal | ICD-10-CM | POA: Diagnosis present

## 2022-01-27 DIAGNOSIS — K56600 Partial intestinal obstruction, unspecified as to cause: Secondary | ICD-10-CM | POA: Diagnosis not present

## 2022-01-27 DIAGNOSIS — F1911 Other psychoactive substance abuse, in remission: Secondary | ICD-10-CM | POA: Diagnosis present

## 2022-01-27 DIAGNOSIS — N184 Chronic kidney disease, stage 4 (severe): Secondary | ICD-10-CM | POA: Diagnosis present

## 2022-01-27 DIAGNOSIS — J9601 Acute respiratory failure with hypoxia: Secondary | ICD-10-CM | POA: Diagnosis not present

## 2022-01-27 DIAGNOSIS — Z6822 Body mass index (BMI) 22.0-22.9, adult: Secondary | ICD-10-CM

## 2022-01-27 DIAGNOSIS — Z91199 Patient's noncompliance with other medical treatment and regimen due to unspecified reason: Secondary | ICD-10-CM

## 2022-01-27 DIAGNOSIS — Z7189 Other specified counseling: Secondary | ICD-10-CM | POA: Diagnosis not present

## 2022-01-27 DIAGNOSIS — Z66 Do not resuscitate: Secondary | ICD-10-CM | POA: Diagnosis present

## 2022-01-27 DIAGNOSIS — I5032 Chronic diastolic (congestive) heart failure: Secondary | ICD-10-CM | POA: Diagnosis not present

## 2022-01-27 DIAGNOSIS — E1165 Type 2 diabetes mellitus with hyperglycemia: Secondary | ICD-10-CM | POA: Diagnosis not present

## 2022-01-27 DIAGNOSIS — Z8616 Personal history of COVID-19: Secondary | ICD-10-CM

## 2022-01-27 DIAGNOSIS — Z8249 Family history of ischemic heart disease and other diseases of the circulatory system: Secondary | ICD-10-CM

## 2022-01-27 DIAGNOSIS — S065XAA Traumatic subdural hemorrhage with loss of consciousness status unknown, initial encounter: Secondary | ICD-10-CM | POA: Diagnosis not present

## 2022-01-27 DIAGNOSIS — F1010 Alcohol abuse, uncomplicated: Secondary | ICD-10-CM | POA: Diagnosis present

## 2022-01-27 DIAGNOSIS — Z515 Encounter for palliative care: Secondary | ICD-10-CM | POA: Diagnosis not present

## 2022-01-27 DIAGNOSIS — R195 Other fecal abnormalities: Secondary | ICD-10-CM

## 2022-01-27 DIAGNOSIS — Z930 Tracheostomy status: Secondary | ICD-10-CM

## 2022-01-27 DIAGNOSIS — I8510 Secondary esophageal varices without bleeding: Secondary | ICD-10-CM | POA: Diagnosis present

## 2022-01-27 DIAGNOSIS — F32A Depression, unspecified: Secondary | ICD-10-CM | POA: Diagnosis present

## 2022-01-27 DIAGNOSIS — W19XXXA Unspecified fall, initial encounter: Secondary | ICD-10-CM | POA: Diagnosis not present

## 2022-01-27 DIAGNOSIS — B182 Chronic viral hepatitis C: Secondary | ICD-10-CM | POA: Diagnosis present

## 2022-01-27 DIAGNOSIS — Z1152 Encounter for screening for COVID-19: Secondary | ICD-10-CM

## 2022-01-27 DIAGNOSIS — J4489 Other specified chronic obstructive pulmonary disease: Secondary | ICD-10-CM | POA: Diagnosis present

## 2022-01-27 DIAGNOSIS — F17210 Nicotine dependence, cigarettes, uncomplicated: Secondary | ICD-10-CM | POA: Diagnosis present

## 2022-01-27 DIAGNOSIS — Z79891 Long term (current) use of opiate analgesic: Secondary | ICD-10-CM

## 2022-01-27 DIAGNOSIS — E861 Hypovolemia: Secondary | ICD-10-CM | POA: Diagnosis not present

## 2022-01-27 DIAGNOSIS — Z711 Person with feared health complaint in whom no diagnosis is made: Secondary | ICD-10-CM | POA: Diagnosis not present

## 2022-01-27 DIAGNOSIS — K766 Portal hypertension: Secondary | ICD-10-CM | POA: Diagnosis present

## 2022-01-27 DIAGNOSIS — L299 Pruritus, unspecified: Secondary | ICD-10-CM | POA: Diagnosis present

## 2022-01-27 DIAGNOSIS — K219 Gastro-esophageal reflux disease without esophagitis: Secondary | ICD-10-CM | POA: Diagnosis present

## 2022-01-27 DIAGNOSIS — G912 (Idiopathic) normal pressure hydrocephalus: Secondary | ICD-10-CM | POA: Diagnosis present

## 2022-01-27 DIAGNOSIS — K31819 Angiodysplasia of stomach and duodenum without bleeding: Secondary | ICD-10-CM | POA: Diagnosis present

## 2022-01-27 DIAGNOSIS — B1920 Unspecified viral hepatitis C without hepatic coma: Secondary | ICD-10-CM | POA: Diagnosis present

## 2022-01-27 DIAGNOSIS — R296 Repeated falls: Secondary | ICD-10-CM | POA: Diagnosis present

## 2022-01-27 DIAGNOSIS — E870 Hyperosmolality and hypernatremia: Secondary | ICD-10-CM | POA: Diagnosis not present

## 2022-01-27 DIAGNOSIS — Z794 Long term (current) use of insulin: Secondary | ICD-10-CM

## 2022-01-27 DIAGNOSIS — Z923 Personal history of irradiation: Secondary | ICD-10-CM

## 2022-01-27 DIAGNOSIS — N1832 Chronic kidney disease, stage 3b: Secondary | ICD-10-CM | POA: Diagnosis present

## 2022-01-27 DIAGNOSIS — Z781 Physical restraint status: Secondary | ICD-10-CM

## 2022-01-27 LAB — CBC WITH DIFFERENTIAL/PLATELET
Abs Immature Granulocytes: 0.73 10*3/uL — ABNORMAL HIGH (ref 0.00–0.07)
Basophils Absolute: 0.1 10*3/uL (ref 0.0–0.1)
Basophils Relative: 0 %
Eosinophils Absolute: 0.1 10*3/uL (ref 0.0–0.5)
Eosinophils Relative: 1 %
HCT: 21.9 % — ABNORMAL LOW (ref 36.0–46.0)
Hemoglobin: 6.6 g/dL — CL (ref 12.0–15.0)
Immature Granulocytes: 4 %
Lymphocytes Relative: 8 %
Lymphs Abs: 1.5 10*3/uL (ref 0.7–4.0)
MCH: 26.3 pg (ref 26.0–34.0)
MCHC: 30.1 g/dL (ref 30.0–36.0)
MCV: 87.3 fL (ref 80.0–100.0)
Monocytes Absolute: 1 10*3/uL (ref 0.1–1.0)
Monocytes Relative: 6 %
Neutro Abs: 14.3 10*3/uL — ABNORMAL HIGH (ref 1.7–7.7)
Neutrophils Relative %: 81 %
Platelets: 98 10*3/uL — ABNORMAL LOW (ref 150–400)
RBC: 2.51 MIL/uL — ABNORMAL LOW (ref 3.87–5.11)
RDW: 21.6 % — ABNORMAL HIGH (ref 11.5–15.5)
Smear Review: DECREASED
WBC: 17.6 10*3/uL — ABNORMAL HIGH (ref 4.0–10.5)
nRBC: 0 % (ref 0.0–0.2)

## 2022-01-27 LAB — COMPREHENSIVE METABOLIC PANEL
ALT: 35 U/L (ref 0–44)
AST: 63 U/L — ABNORMAL HIGH (ref 15–41)
Albumin: 2.3 g/dL — ABNORMAL LOW (ref 3.5–5.0)
Alkaline Phosphatase: 98 U/L (ref 38–126)
Anion gap: 10 (ref 5–15)
BUN: 63 mg/dL — ABNORMAL HIGH (ref 6–20)
CO2: 22 mmol/L (ref 22–32)
Calcium: 8.9 mg/dL (ref 8.9–10.3)
Chloride: 113 mmol/L — ABNORMAL HIGH (ref 98–111)
Creatinine, Ser: 5.78 mg/dL — ABNORMAL HIGH (ref 0.44–1.00)
GFR, Estimated: 8 mL/min — ABNORMAL LOW (ref >60)
Glucose, Bld: 141 mg/dL — ABNORMAL HIGH (ref 70–99)
Potassium: 4.9 mmol/L (ref 3.5–5.1)
Sodium: 145 mmol/L (ref 135–145)
Total Bilirubin: 2.4 mg/dL — ABNORMAL HIGH (ref 0.3–1.2)
Total Protein: 7.1 g/dL (ref 6.5–8.1)

## 2022-01-27 LAB — ETHANOL: Alcohol, Ethyl (B): <10 mg/dL (ref <10)

## 2022-01-27 LAB — URINALYSIS, ROUTINE W REFLEX MICROSCOPIC
Bilirubin Urine: NEGATIVE
Glucose, UA: NEGATIVE mg/dL
Hgb urine dipstick: NEGATIVE
Ketones, ur: NEGATIVE mg/dL
Leukocytes,Ua: NEGATIVE
Nitrite: NEGATIVE
Protein, ur: NEGATIVE mg/dL
Specific Gravity, Urine: 1.014 (ref 1.005–1.030)
pH: 5 (ref 5.0–8.0)

## 2022-01-27 LAB — CBG MONITORING, ED
Glucose-Capillary: 129 mg/dL — ABNORMAL HIGH (ref 70–99)
Glucose-Capillary: 115 mg/dL — ABNORMAL HIGH (ref 70–99)

## 2022-01-27 LAB — HEMOGLOBIN AND HEMATOCRIT, BLOOD
HCT: 26 % — ABNORMAL LOW (ref 36.0–46.0)
Hemoglobin: 8.2 g/dL — ABNORMAL LOW (ref 12.0–15.0)

## 2022-01-27 LAB — PROCALCITONIN: Procalcitonin: 6.49 ng/mL

## 2022-01-27 LAB — RESP PANEL BY RT-PCR (RSV, FLU A&B, COVID)  RVPGX2
Influenza A by PCR: NEGATIVE
Influenza B by PCR: NEGATIVE
Resp Syncytial Virus by PCR: POSITIVE — AB
SARS Coronavirus 2 by RT PCR: NEGATIVE

## 2022-01-27 LAB — POC OCCULT BLOOD, ED: Fecal Occult Bld: POSITIVE — AB

## 2022-01-27 LAB — PROTIME-INR
INR: 1.6 — ABNORMAL HIGH (ref 0.8–1.2)
Prothrombin Time: 18.4 seconds — ABNORMAL HIGH (ref 11.4–15.2)

## 2022-01-27 LAB — AMMONIA: Ammonia: 70 umol/L — ABNORMAL HIGH (ref 9–35)

## 2022-01-27 LAB — LACTIC ACID, PLASMA
Lactic Acid, Venous: 3.2 mmol/L (ref 0.5–1.9)
Lactic Acid, Venous: 3.5 mmol/L (ref 0.5–1.9)

## 2022-01-27 LAB — PREPARE RBC (CROSSMATCH)

## 2022-01-27 LAB — APTT: aPTT: 36 seconds (ref 24–36)

## 2022-01-27 LAB — BRAIN NATRIURETIC PEPTIDE: B Natriuretic Peptide: 904.3 pg/mL — ABNORMAL HIGH (ref 0.0–100.0)

## 2022-01-27 MED ORDER — ALBUTEROL SULFATE (2.5 MG/3ML) 0.083% IN NEBU: 2.5000 mg | INHALATION_SOLUTION | RESPIRATORY_TRACT | Status: DC | PRN

## 2022-01-27 MED ORDER — LACTATED RINGERS IV BOLUS (SEPSIS)
1000.0000 mL | Freq: Once | INTRAVENOUS | Status: AC
  Administered 2022-01-27: 1000 mL via INTRAVENOUS

## 2022-01-27 MED ORDER — HYDROCERIN EX CREA: 1.0000 | TOPICAL_CREAM | Freq: Two times a day (BID) | CUTANEOUS | Status: DC | PRN

## 2022-01-27 MED ORDER — SODIUM CHLORIDE 0.9 % IV SOLN
2.0000 g | INTRAVENOUS | Status: DC
  Administered 2022-01-27 – 2022-01-31 (×5): 2 g via INTRAVENOUS
  Filled 2022-01-27 (×5): qty 20

## 2022-01-27 MED ORDER — ACETAMINOPHEN 650 MG RE SUPP: 650.0000 mg | Freq: Four times a day (QID) | RECTAL | Status: DC | PRN

## 2022-01-27 MED ORDER — ONDANSETRON HCL 4 MG PO TABS: 4.0000 mg | ORAL_TABLET | Freq: Four times a day (QID) | ORAL | Status: DC | PRN

## 2022-01-27 MED ORDER — SODIUM CHLORIDE 0.9 % IV SOLN: INTRAVENOUS | Status: DC

## 2022-01-27 MED ORDER — SODIUM CHLORIDE 0.9% FLUSH
3.0000 mL | Freq: Two times a day (BID) | INTRAVENOUS | Status: DC
  Administered 2022-01-27 – 2022-01-31 (×7): 3 mL via INTRAVENOUS

## 2022-01-27 MED ORDER — ACETAMINOPHEN 325 MG PO TABS: 650.0000 mg | ORAL_TABLET | Freq: Four times a day (QID) | ORAL | Status: DC | PRN

## 2022-01-27 MED ORDER — TRIAMCINOLONE ACETONIDE 0.1 % EX CREA: 1.0000 | TOPICAL_CREAM | Freq: Two times a day (BID) | CUTANEOUS | Status: DC | PRN

## 2022-01-27 MED ORDER — LATANOPROST 0.005 % OP SOLN
1.0000 [drp] | Freq: Every day | OPHTHALMIC | Status: DC
  Administered 2022-01-28 – 2022-01-31 (×2): 1 [drp] via OPHTHALMIC
  Filled 2022-01-27 (×2): qty 2.5

## 2022-01-27 MED ORDER — SODIUM CHLORIDE 0.9 % IV SOLN: 1.0000 g | Freq: Once | INTRAVENOUS | Status: DC

## 2022-01-27 MED ORDER — FAMOTIDINE IN NACL 20-0.9 MG/50ML-% IV SOLN
20.0000 mg | INTRAVENOUS | Status: DC
  Administered 2022-01-27 – 2022-01-31 (×4): 20 mg via INTRAVENOUS
  Filled 2022-01-27 (×4): qty 50

## 2022-01-27 MED ORDER — LIDOCAINE 5 % EX PTCH
1.0000 | MEDICATED_PATCH | Freq: Every day | CUTANEOUS | Status: DC
  Administered 2022-01-30 – 2022-02-01 (×2): 1 via TRANSDERMAL
  Filled 2022-01-27 (×2): qty 1

## 2022-01-27 MED ORDER — SODIUM CHLORIDE 0.9 % IV BOLUS (SEPSIS): 1000.0000 mL | Freq: Once | INTRAVENOUS | Status: DC

## 2022-01-27 MED ORDER — SODIUM CHLORIDE 0.9% IV SOLUTION: Freq: Once | INTRAVENOUS | Status: AC

## 2022-01-27 MED ORDER — KETOTIFEN FUMARATE 0.035 % OP SOLN
1.0000 [drp] | Freq: Every day | OPHTHALMIC | Status: DC
  Administered 2022-01-28 – 2022-02-01 (×5): 1 [drp] via OPHTHALMIC
  Filled 2022-01-27: qty 5

## 2022-01-27 MED ORDER — ONDANSETRON HCL 4 MG/2ML IJ SOLN: 4.0000 mg | Freq: Four times a day (QID) | INTRAMUSCULAR | Status: DC | PRN

## 2022-01-27 MED ORDER — INSULIN ASPART 100 UNIT/ML IJ SOLN
0.0000 [IU] | Freq: Four times a day (QID) | INTRAMUSCULAR | Status: DC
  Administered 2022-01-30: 1 [IU] via SUBCUTANEOUS

## 2022-01-27 MED ORDER — LACTULOSE ENEMA
300.0000 mL | Freq: Three times a day (TID) | ORAL | Status: DC
  Administered 2022-01-27 – 2022-01-31 (×8): 300 mL via RECTAL
  Filled 2022-01-27 (×13): qty 300

## 2022-01-27 MED ORDER — KETOTIFEN FUMARATE 0.025 % OP SOLN: 1.0000 [drp] | Freq: Every day | OPHTHALMIC | Status: DC

## 2022-01-27 MED ORDER — PANTOPRAZOLE SODIUM 40 MG IV SOLR
40.0000 mg | Freq: Two times a day (BID) | INTRAVENOUS | Status: DC
  Administered 2022-01-27 – 2022-02-01 (×9): 40 mg via INTRAVENOUS
  Filled 2022-01-27 (×9): qty 10

## 2022-01-27 NOTE — ED Notes (Signed)
Pt cleaned, repositioned, purewick placed.

## 2022-01-27 NOTE — ED Notes (Signed)
Pt known to try and get out of bed.Has attempted twice. Bed alarm placed.

## 2022-01-27 NOTE — H&P (Addendum)
History and Physical    Patient: Judy Meza EGB:[id number] DOB: Mar 26, 1960 DOA: 01/16/2022 DOS: the patient was seen and examined on 02/10/2022 PCP: Caprice Renshaw, MD  Patient coming from: Nursing facility via EMS  Chief Complaint:  Chief Complaint  Patient presents with   Altered Mental Status   HPI: Judy Meza is a 62 y.o. female with medical history significant of PMH significant for prior alcohol abuse, hepatitis C, liver cirrhosis, diastolic CHF, DM2, HTN, COPD, anxiety/depression, dementia, laryngeal CA s/p radiation and tracheostomy, complicated by tracheocutaneous fistula, CKD stage 3, NPH, prior IV drug use, and OSA who presents after noted to be declining mental status over the last 4 days.  Patient is unable to provide any history at this time her sister is at bedside provides history.  Her sister notes that she had seen her 3 days ago and she was up walking with a walker seen to be in her normal state of health and had taken her lactulose least that day that she is aware of.  However, she was told that the patient had been up all night recently.  The following day in the patient had been in the bed most of the day and her sister had been able to get her to eat small amount.  However, yesterday patient had reportedly been in the bed and slept the whole day for which she does not believe she got any of her lactulose medicine.  This morning she was called at around 5 AM stating that her sister was less responsive with thick mucus present and hypoxic with increased work of breathing.  En route with EMS patient's O2 saturations were in the 80s on room air and was placed on a nonrebreather with improvement to 100%.  Patient reported to be DNR/DNI.  In the emergency department patient was noted to have temperature 96.5 F, blood pressures elevated up to 185/95, and O2 saturations currently maintained on currently on room air after patient was suctioned.  Chest x-ray showed no acute  abnormality.  Labs significant for WBC 17.6, hemoglobin 6.6, platelets 98, CO2 22, BUN 63, creatinine 5.78, glucose 141, albumin 2.3, AST 63, total bilirubin 2.4, BNP 904.3, and lactic acid 3.2.  Alcohol level was undetectable.  Urinalysis did not note any signs of infection and was amber in color.  Stool guaiac was positive for blood, by EDP did not report any gross signs of blood or melena.  Blood cultures have been obtained.  Patient has been typed and screened for possible need of blood products.  Ordered 2 L of IV fluids and Rocephin IV.  Review of Systems: unable to review all systems due to the inability of the patient to answer questions. Past Medical History:  Diagnosis Date   Acute kidney failure ([place])    Anemia    Anxiety    Arthritis    Asthma    Blood transfusion without reported diagnosis    Chronic hepatitis C ([place])    Chronic kidney disease, stage 3 ([place])    Cirrhosis ([place])    COPD (chronic obstructive pulmonary disease) ([place])    COVID-19 virus infection    Depression    Diabetes mellitus without complication ([place])    P7T 7.2% 09/17/20   GERD (gastroesophageal reflux disease)    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   I   Hypertension    Liver disease    Normal pressure hydrocephalus (HCC)    Pneumonia    Renal disorder  Sleep apnea    uses O2 at night   Thrombocytopenia ([place])    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   Vaginal Pap smear, abnormal    Past Surgical History:  Procedure Laterality Date   ESOPHAGEAL BANDING  10/04/2019   Procedure: ESOPHAGEAL BANDING;  Surgeon: Lavena Bullion, DO;  Location: [facility];  Service: Gastroenterology;;   ESOPHAGOGASTRODUODENOSCOPY (EGD) WITH PROPOFOL N/A 10/04/2019   Procedure: ESOPHAGOGASTRODUODENOSCOPY (EGD) WITH PROPOFOL;  Surgeon: Lavena Bullion, DO;  Location: [facility];  Service: Gastroenterology;  Laterality: N/A;   HOT HEMOSTASIS N/A 10/04/2019   Procedure: HOT  HEMOSTASIS (ARGON PLASMA COAGULATION/BICAP);  Surgeon: Lavena Bullion, DO;  Location: [facility];  Service: Gastroenterology;  Laterality: N/A;   PORT-A-CATH REMOVAL     SKIN GRAFT     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/2020   Procedure: REPAIR OF TRACHEOCUTANEOIS FISTULA;  Surgeon: Izora Gala, MD;  Location: [facility];  Service: ENT;  Laterality: N/A;   Social History:  reports that she has been smoking cigarettes. She has been smoking an average of .25 packs per day. She has never used smokeless tobacco. She reports that she does not currently use alcohol. She reports that she does not currently use drugs.  No Known Allergies  Family History  Problem Relation Age of Onset   Cancer Mother    Healthy Mother    Cancer Maternal Uncle    Cancer Maternal Grandmother    Hypertension Other     Prior to Admission medications   Medication Sig Start Date End Date Taking? Authorizing Provider  albuterol (VENTOLIN HFA) 108 (90 Base) MCG/ACT inhaler Inhale 2 puffs into the lungs every 6 (six) hours as needed for shortness of breath.    [provider]  Baclofen 5 MG TABS Take 5 mg by mouth at bedtime.    [provider]  Boric Acid Vaginal (AZO BORIC ACID) 600 MG SUPP Place 1 suppository vaginally at bedtime. Patient not taking: Reported on 01/10/2022 11/27/21   Shelly Bombard, MD  Carboxymethylcellul-Glycerin (REFRESH OPTIVE) 1-0.9 % GEL Place 1 drop into both eyes 4 (four) times daily.    [provider]  clobetasol ointment (TEMOVATE) 0.34 % Apply 1 Application topically daily as needed (itching).    [provider]  diclofenac Sodium (VOLTAREN) 1 % GEL Apply 2 g topically 4 (four) times daily. Left ankle and leg Patient taking differently: Apply 4 g topically 4 (four) times daily. Apply to knees and ankles topically four times a day. 11/01/20   Geradine Girt, DO  diphenhydrAMINE-zinc acetate (BENADRYL ITCH STOPPING) cream Apply 1 application  topically every 4 (four) hours as needed for itching.    [provider]  ergocalciferol (VITAMIN D2) 1.25 MG (50000 UT) capsule Take 50,000 Units by mouth every Saturday.    [provider]  famotidine (PEPCID) 20 MG tablet Take 20 mg by mouth daily.    [provider]  folic acid (FOLVITE) 1 MG tablet Take 1 tablet (1 mg total) by mouth daily. 10/09/19   Andrew Au, MD  furosemide (LASIX) 20 MG tablet Take 20 mg by mouth daily.    [provider]  gabapentin (NEURONTIN) 100 MG capsule Take 100 mg by mouth 3 (three) times daily.    [provider]  hydrocortisone 2.5 % cream Apply 1 application topically See admin instructions. Apply to back of neck every shift.    [provider]  hydrOXYzine (ATARAX/VISTARIL) 25 MG  tablet Take 25 mg by mouth every 6 (six) hours as needed for itching.    [provider]  insulin lispro (HUMALOG KWIKPEN) 100 UNIT/ML KwikPen Inject 0-15 Units into the skin See admin instructions. Inject as per sliding scale subcutaneously with meals: If 70-120= 0 units 121-150= 2 units 151-200= 3 units 201-250= 5 units 251-300= 8 units 301-350= 11 units 351-400= 15 units Notify MD if above 400.    [provider]  ketoconazole (NIZORAL) 2 % shampoo Apply 1 Application topically 3 (three) times a week. Monday, Thursday, Saturday    [provider]  ketotifen (ZADITOR) 0.025 % ophthalmic solution Place 1 drop into both eyes daily.    [provider]  lactulose (CHRONULAC) 10 GM/15ML solution Take 30 g by mouth in the morning, at noon, in the evening, and at bedtime.    [provider]  latanoprost (XALATAN) 0.005 % ophthalmic solution Place 1 drop into both eyes at bedtime.    [provider]  lidocaine (LIDODERM) 5 % Place 1 patch onto the skin daily. Remove & Discard patch within 12 hours or as directed by MD Patient taking differently: Place 1 patch onto the skin  daily. Remove & Discard patch within 12 hours or as directed by MD. Apply to left side topically one time a day for pain. Apply each AM and remove each PM. 11/02/20   Geradine Girt, DO  loratadine (CLARITIN) 10 MG tablet Take 10 mg by mouth daily.    [provider]  nadolol (CORGARD) 20 MG tablet Take 1 tablet (20 mg total) by mouth daily. 11/02/20   Geradine Girt, DO  ondansetron (ZOFRAN) 8 MG tablet Take by mouth every 8 (eight) hours as needed for nausea or vomiting.    [provider]  oxyCODONE (OXY IR/ROXICODONE) 5 MG immediate release tablet Take 5 mg by mouth every 6 (six) hours as needed for moderate pain or severe pain. 10/24/21   [provider]  OXYGEN Inhale 2 L/min into the lungs as needed (COPD).    [provider]  PARoxetine (PAXIL) 30 MG tablet Take 30 mg by mouth daily.    [provider]  potassium chloride SA (KLOR-CON M) 20 MEQ tablet Take 20 mEq by mouth daily.    [provider]  rifaximin (XIFAXAN) 550 MG TABS tablet Take 1 tablet (550 mg total) by mouth 2 (two) times daily. 01/22/20   Cirigliano, Vito V, DO  Skin Protectants, Misc. (EUCERIN) cream Apply 1 application topically every 12 (twelve) hours as needed for dry skin.    [provider]  sodium bicarbonate 650 MG tablet Take 1 tablet (650 mg total) by mouth 2 (two) times daily. 08/09/19   Hosie Poisson, MD  thiamine 100 MG tablet Take 1 tablet (100 mg total) by mouth daily. 06/08/19   Rai, Ripudeep K, MD  triamcinolone cream (KENALOG) 0.1 % Apply 1 Application topically every 12 (twelve) hours as needed (itching).    [provider]    Physical Exam: Vitals:   01/31/2022 0539 01/23/2022 0715  BP: (!) 185/95 (!) 179/97  Pulse: 86 89  Resp: 20 17  Temp: (!) 96.5 F (35.8 C)   TempSrc: Rectal   SpO2: 100% 95%   Exam  Constitutional: Chronically ill-appearing female who stares off Eyes: PERRL, sclera icterus present ENMT: Mucous membranes  are dry.  Edentulous. Neck: Stoma site with copious tan appearing secretions Respiratory: clear to auscultation bilaterally, no wheezing, no crackles.  O2  saturation currently maintained on room air. Cardiovascular: Regular rate and rhythm, no murmurs / rubs / gallops. No extremity edema. 2+ pedal pulses. No carotid bruits.  Abdomen: no tenderness, no masses palpated. No hepatosplenomegaly. Bowel sounds positive.  Musculoskeletal: no clubbing / cyanosis. No joint deformity upper and lower extremities. Good ROM, no contractures. Normal muscle tone.  Skin: no rashes, lesions, ulcers. No induration Neurologic: CN 2-12 grossly intact.  Strength 5/5 in all 4.  Psychiatric: Alert,  but unable to assess for orientation.  Patient just staring off Data Reviewed:  EKG reveals sinus rhythm at 90 bpm with first-degree heart block, left atrial enlargement, and LVH.  Reviewed labs, imaging, and pertinent records as noted above in HPI.  Assessment and Plan: Acute hepatic encephalopathy Hepatic cirrhosis Presented after being noted to be acutely altered and less responsive.  Ammonia level was noted to be 70.  Patient with history of grade 2 esophageal varices along with duodenal angioectasia EGD from 2021.  Patient had previously had banding and was given APC to treat the single duodenal angiectasia. -Admit to progressive bed -Aspiration precautions with elevation head of bed -Neurochecks -N.p.o. -Insert flex sig system -Lactulose enemas 3 times daily -Resume rifaximin once able  SIRS Lactic acidosis Acute.  Patient was initially noted to have initial temperature of 96.5 F with WBC elevated at 17.6 meeting SIRS criteria.  Lactic acid elevated at 3.2.  Chest x-ray and urinalysis did not appear to give concern for infection.  Blood cultures have been obtained.  Patient had been noted to have significant thick tan secretions.  Patient had been ordered 2 L of lactated Ringer's and started on empiric  antibiotics of Rocephin. -Follow-up blood cultures -Check sputum cultures -Check influenza, COVID-19, and RSV screening -Check procalcitonin and trend lactic acid levels -Continue empiric antibiotics of Rocephin.  Continue to monitor and adjust antibiotics as warranted -Recheck CBC tomorrow morning.  Normocytic anemia Question possible GI bleed Acute on chronic.  Patient presents with hemoglobin down to 6.6 when previously had been 7.9 prior to discharge on 01/11/2022.  Stool guaiacs were noted to be positive, but reported no gross blood or melena.  Patient was typed and screened for blood.  Patient with prior history of esophageal varices previously banded back in 2021 as well as angiodysplasia. -Transfuse 1 unit of PRBCs. -Recheck H&H posttransfusion -Transfuse blood products as needed to maintain hemoglobin at least 8 g/dL  Acute renal failure  Patient presents with creatinine elevated up to 5.78 with BUN 63.  Creatinine previously had been 1.33 on 12/31.  Urinalysis did not show any signs of infection.  Patient was given 2 L of lactated Ringer's.  Question possibility of hepatorenal syndrome. -Strict I&Os -Check urine sodium, urine creatinine, urine urea -Continue to monitor kidney function daily  Diastolic congestive heart failure Patient does not appear grossly fluid overloaded at this time.  Chest x-ray with stable cardiomediastinal contours without pleural effusion, edema, or focal opacities appreciated. BNP elevated at 904.3.    Last EF noted to be 55-60% with grade 2 diastolic dysfunction back in 09/2019. -Daily weights  COPD No significant wheezing appreciated on physical exam . -Breathing treatments as needed  Controlled diabetes mellitus type 2, with long-term use of insulin Patient's last available hemoglobin A1c was 5.4 on 01/10/2022. -Hypoglycemic protocol -CBGs every 6 hours with very sensitive SSI given kidney function  Elevated liver  enzymes Hyperbilirubinemia Acute on chronic.    AST elevated at 63 with ALT 35, and total bilirubin elevated 2.4. -  Continue to monitor daily  Thrombocytopenia Chronic.  Platelet count 98 on admission which appears around patient's baseline. -Continue to monitor  Dementia  Trach -Trach care and suctioning as needed  GERD -Pepcid IV  We will need to review patient's medications once   DVT prophylaxis: SCDs Advance Care Planning:   Code Status: DNR   Consults: GI  Family Communication: Family updated at bedside  Severity of Illness: The appropriate patient status for this patient is INPATIENT. Inpatient status is judged to be reasonable and necessary in order to provide the required intensity of service to ensure the patient's safety. The patient's presenting symptoms, physical exam findings, and initial radiographic and laboratory data in the context of their chronic comorbidities is felt to place them at high risk for further clinical deterioration. Furthermore, it is not anticipated that the patient will be medically stable for discharge from the hospital within 2 midnights of admission.   * I certify that at the point of admission it is my clinical judgment that the patient will require inpatient hospital care spanning beyond 2 midnights from the point of admission due to high intensity of service, high risk for further deterioration and high frequency of surveillance required.*  Author: Norval Morton, MD 02/05/2022 7:38 AM  For on call review [email or web address].

## 2022-01-27 NOTE — ED Notes (Signed)
Warm blankets applied

## 2022-01-27 NOTE — ED Notes (Signed)
RT at bedside to suction trach

## 2022-01-27 NOTE — ED Notes (Signed)
Patient's sister called and given HIPAA compliant update over the phone.

## 2022-01-27 NOTE — Progress Notes (Signed)
RT called  to patients room to suction patients stoma.

## 2022-01-27 NOTE — ED Provider Notes (Signed)
[facility] EMERGENCY DEPARTMENT Provider Note   CSN: [id number] Arrival date & time: 02/09/2022  0532     History  Chief Complaint  Patient presents with   Altered Mental Status   Level 5 caveat due to altered mental status Judy Meza is a 62 y.o. female.  The history is provided by the EMS personnel. The history is limited by the condition of the patient.   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  Entire history is given by the paramedics.  It is reported patient had altered mental status for at least 3 days.  EMS reports on their arrival patient was hypoxic to the 80s.  She was placed on nonrebreather with some improvement.  She is not on chronic oxygen. Patient is a DNR Past Medical History:  Diagnosis Date   Acute kidney failure ([place])    Anemia    Anxiety    Arthritis    Asthma    Blood transfusion without reported diagnosis    Chronic hepatitis C ([place])    Chronic kidney disease, stage 3 (HCC)    Cirrhosis ([place])    COPD (chronic obstructive pulmonary disease) ([place])    COVID-19 virus infection    Depression    Diabetes mellitus without complication ([place])    H0Q 7.2% 09/17/20   GERD (gastroesophageal reflux disease)    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   I   Hypertension    Liver disease    Normal pressure hydrocephalus (HCC)    Pneumonia    Renal disorder    Sleep apnea    uses O2 at night   Thrombocytopenia (HCC)    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   Vaginal Pap smear, abnormal     Home Medications Prior to Admission medications   Medication Sig Start Date End Date Taking? Authorizing Provider  albuterol (VENTOLIN HFA) 108 (90 Base) MCG/ACT inhaler Inhale 2 puffs into the lungs every 6 (six)  hours as needed for shortness of breath.    [provider]  Baclofen 5 MG TABS Take 5 mg by mouth at bedtime.    [provider]  Boric Acid Vaginal (AZO BORIC ACID) 600 MG SUPP Place 1 suppository vaginally at bedtime. Patient not taking: Reported on 01/10/2022 11/27/21   Shelly Bombard, MD  Carboxymethylcellul-Glycerin (REFRESH OPTIVE) 1-0.9 % GEL Place 1 drop into both eyes 4 (four) times daily.    [provider]  clobetasol ointment (TEMOVATE) 6.57 % Apply 1 Application topically daily as needed (itching).    [provider]  diclofenac Sodium (VOLTAREN) 1 % GEL Apply 2 g topically 4 (four) times daily. Left ankle and leg Patient taking differently: Apply 4 g topically 4 (four) times daily. Apply to knees and ankles topically four times a day. 11/01/20   Geradine Girt, DO  diphenhydrAMINE-zinc acetate (BENADRYL ITCH STOPPING) cream Apply 1 application topically every 4 (four) hours as needed for itching.    [provider]  ergocalciferol (VITAMIN D2) 1.25 MG (50000 UT) capsule Take 50,000 Units by mouth every Saturday.    [provider]  famotidine (PEPCID) 20 MG tablet Take 20 mg by mouth daily.    [provider]  folic acid (FOLVITE) 1 MG tablet Take 1 tablet (1 mg total) by mouth daily. 10/09/19   Edison Simon  Y, MD  furosemide (LASIX) 20 MG tablet Take 20 mg by mouth daily.    [provider]  gabapentin (NEURONTIN) 100 MG capsule Take 100 mg by mouth 3 (three) times daily.    [provider]  hydrocortisone 2.5 % cream Apply 1 application topically See admin instructions. Apply to back of neck every shift.    [provider]  hydrOXYzine (ATARAX/VISTARIL) 25 MG tablet Take 25 mg by mouth every 6 (six) hours as needed for itching.    [provider]  insulin lispro (HUMALOG KWIKPEN) 100 UNIT/ML KwikPen Inject 0-15 Units into the skin See admin instructions. Inject as per sliding scale  subcutaneously with meals: If 70-120= 0 units 121-150= 2 units 151-200= 3 units 201-250= 5 units 251-300= 8 units 301-350= 11 units 351-400= 15 units Notify MD if above 400.    [provider]  ketoconazole (NIZORAL) 2 % shampoo Apply 1 Application topically 3 (three) times a week. Monday, Thursday, Saturday    [provider]  ketotifen (ZADITOR) 0.025 % ophthalmic solution Place 1 drop into both eyes daily.    [provider]  lactulose (CHRONULAC) 10 GM/15ML solution Take 30 g by mouth in the morning, at noon, in the evening, and at bedtime.    [provider]  latanoprost (XALATAN) 0.005 % ophthalmic solution Place 1 drop into both eyes at bedtime.    [provider]  lidocaine (LIDODERM) 5 % Place 1 patch onto the skin daily. Remove & Discard patch within 12 hours or as directed by MD Patient taking differently: Place 1 patch onto the skin daily. Remove & Discard patch within 12 hours or as directed by MD. Apply to left side topically one time a day for pain. Apply each AM and remove each PM. 11/02/20   Geradine Girt, DO  loratadine (CLARITIN) 10 MG tablet Take 10 mg by mouth daily.    [provider]  nadolol (CORGARD) 20 MG tablet Take 1 tablet (20 mg total) by mouth daily. 11/02/20   Geradine Girt, DO  ondansetron (ZOFRAN) 8 MG tablet Take by mouth every 8 (eight) hours as needed for nausea or vomiting.    [provider]  oxyCODONE (OXY IR/ROXICODONE) 5 MG immediate release tablet Take 5 mg by mouth every 6 (six) hours as needed for moderate pain or severe pain. 10/24/21   [provider]  OXYGEN Inhale 2 L/min into the lungs as needed (COPD).    [provider]  PARoxetine (PAXIL) 30 MG tablet Take 30 mg by mouth daily.    [provider]  potassium chloride SA (KLOR-CON M) 20 MEQ tablet Take 20 mEq by mouth daily.    [provider]  rifaximin (XIFAXAN) 550 MG TABS tablet Take 1  tablet (550 mg total) by mouth 2 (two) times daily. 01/22/20   Cirigliano, Vito V, DO  Skin Protectants, Misc. (EUCERIN) cream Apply 1 application topically every 12 (twelve) hours as needed for dry skin.    [provider]  sodium bicarbonate 650 MG tablet Take 1 tablet (650 mg total) by mouth 2 (two) times daily. 08/09/19   Hosie Poisson, MD  thiamine 100 MG tablet Take 1 tablet (100 mg total) by mouth daily. 06/08/19   Rai, Ripudeep K, MD  triamcinolone cream (KENALOG) 0.1 % Apply 1 Application topically every 12 (twelve) hours as needed (itching).    [provider]      Allergies    Patient has no known  allergies.    Review of Systems   Review of Systems  Unable to perform ROS: Mental status change    Physical Exam Updated Vital Signs BP (!) 179/97   Pulse 89   Temp (!) 96.5 F (35.8 C) (Rectal)   Resp 17   SpO2 95%  Physical Exam CONSTITUTIONAL: Ill-appearing, appears older than stated age HEAD: Normocephalic/atraumatic EYES: Pupils not pinpoint NECK: Stoma site noted with copious secretions SPINE/BACK:entire spine nontender CV: S1/S2 noted, no murmurs/rubs/gallops noted LUNGS: Coarse breath sounds bilaterally, referred upper airway sounds noted ABDOMEN: soft, nondistended Rectal -chaperoned by nurse Raquel Sarna No blood or melena.  No obvious rectal abscess or mass NEURO: Pt is somnolent but will occasionally respond to voice moving the bed.  She moves all extremities x 4.  No obvious facial droop EXTREMITIES: pulses normal/equal, no deformities SKIN: warm PSYCH: Unable to assess  ED Results / Procedures / Treatments   Labs (all labs ordered are listed, but only abnormal results are displayed) Labs Reviewed  LACTIC ACID, PLASMA - Abnormal; Notable for the following components:      Result Value   Lactic Acid, Venous 3.2 (*)    All other components within normal limits  COMPREHENSIVE METABOLIC PANEL - Abnormal; Notable for the following components:    Chloride 113 (*)    Glucose, Bld 141 (*)    BUN 63 (*)    Creatinine, Ser 5.78 (*)    Albumin 2.3 (*)    AST 63 (*)    Total Bilirubin 2.4 (*)    GFR, Estimated 8 (*)    All other components within normal limits  CBC WITH DIFFERENTIAL/PLATELET - Abnormal; Notable for the following components:   WBC 17.6 (*)    RBC 2.51 (*)    Hemoglobin 6.6 (*)    HCT 21.9 (*)    RDW 21.6 (*)    Platelets 98 (*)    All other components within normal limits  PROTIME-INR - Abnormal; Notable for the following components:   Prothrombin Time 18.4 (*)    INR 1.6 (*)    All other components within normal limits  URINALYSIS, ROUTINE W REFLEX MICROSCOPIC - Abnormal; Notable for the following components:   Color, Urine AMBER (*)    APPearance CLOUDY (*)    All other components within normal limits  BRAIN NATRIURETIC PEPTIDE - Abnormal; Notable for the following components:   B Natriuretic Peptide 904.3 (*)    All other components within normal limits  CBG MONITORING, ED - Abnormal; Notable for the following components:   Glucose-Capillary 129 (*)    All other components within normal limits  POC OCCULT BLOOD, ED - Abnormal; Notable for the following components:   Fecal Occult Bld POSITIVE (*)    All other components within normal limits  CULTURE, BLOOD (ROUTINE X 2)  CULTURE, BLOOD (ROUTINE X 2)  URINE CULTURE  APTT  ETHANOL  LACTIC ACID, PLASMA  AMMONIA  RAPID URINE DRUG SCREEN, HOSP PERFORMED  TYPE AND SCREEN    EKG EKG Interpretation  Date/Time:  Tuesday January 27 2022 05:47:10 EST Ventricular Rate:  90 PR Interval:  203 QRS Duration: 90 QT Interval:  387 QTC Calculation: 474 R Axis:   80 Text Interpretation: Sinus rhythm Consider left atrial enlargement Left ventricular hypertrophy Interpretation limited secondary to artifact Confirmed by Ripley Fraise ([id number]) on 01/12/2022 6:25:04 AM  Radiology DG Chest Port 1 View  Result Date: 01/23/2022 CLINICAL DATA:  Questionable sepsis.   Evaluate for abnormality. EXAM: PORTABLE CHEST  1 VIEW COMPARISON:  10/28/2020 FINDINGS: Stable cardiomediastinal contours. No pleural effusion or edema. No airspace opacities identified. Visualized osseous structures are unremarkable. IMPRESSION: No active disease. Electronically Signed   By: Kerby Moors M.D.   On: 02/02/2022 05:56    Procedures .Critical Care  Performed by: Ripley Fraise, MD Authorized by: Ripley Fraise, MD   Critical care provider statement:    Critical care time (minutes):  90   Critical care start time:  01/12/2022 6:00 AM   Critical care end time:  01/19/2022 7:30 AM   Critical care time was exclusive of:  Separately billable procedures and treating other patients   Critical care was necessary to treat or prevent imminent or life-threatening deterioration of the following conditions:  Sepsis, respiratory failure and CNS failure or compromise   Critical care was time spent personally by me on the following activities:  Obtaining history from patient or surrogate, examination of patient, development of treatment plan with patient or surrogate, re-evaluation of patient's condition, ordering and review of radiographic studies, ordering and review of laboratory studies, ordering and performing treatments and interventions and review of old charts   I assumed direction of critical care for this patient from another provider in my specialty: no       Medications Ordered in ED Medications  sodium chloride 0.9 % bolus 1,000 mL (has no administration in time range)  lactated ringers bolus 1,000 mL (has no administration in time range)    And  lactated ringers bolus 1,000 mL (has no administration in time range)  cefTRIAXone (ROCEPHIN) 1 g in sodium chloride 0.9 % 100 mL IVPB (has no administration in time range)    ED Course/ Medical Decision Making/ A&P Clinical Course as of 02/10/2022 0757  Tue Jan 27, 2022  0553 Patient arrived in distressed and critically ill.  I  spoke to her sister and guardian Charisse Klinefelter.  She reports patient has had these episodes before and is usually due to hepatic encephalopathy.  She reports over the past several days the patient has started to decline.  This is similar to prior episodes.  She confirms the patient is a DNR/DNI.  She does not want any percutaneous feeding tube.  However we can proceed with a workup to determine cause of her altered mental status. [DW]  0604 I attempted to call her nursing home, but no answer [DW]  0612 Patient was suctioned, with some improvement in her respiratory status.  She is 100% on room air [DW]  0706 WBC(!): 17.6 Leukocytosis  [DW]  0706 Hemoglobin(!!): 6.6 Acute on chronic anemia [DW]  0728 Patient starting to wake up, appears more encephalopathic likely due to her underlying liver disease.  Will defer CT head for now while labs are pending [DW]  0756 Creatinine(!): 5.78 Acute kidney injury [DW]  0756 Patient found to be Hemoccult positive, but is no melena or blood on exam.  Type and screen has been sent.  She is hemodynamically appropriate, will not transfuse blood at this time.  Patient was found to have acute kidney injury, will start IV fluids.  Also has elevated lactate and WBC count.  Code sepsis has been called and will also start IV antibiotics [DW]  0756 Discussed with Dr. Fuller Plan for admission [DW]    Clinical Course User Index [DW] Ripley Fraise, MD  Medical Decision Making Amount and/or Complexity of Data Reviewed Labs: ordered. Decision-making details documented in ED Course. Radiology: ordered. ECG/medicine tests: ordered.  Risk Decision regarding hospitalization.   This patient presents to the ED for concern of altered mental status, this involves an extensive number of treatment options, and is a complaint that carries with it a high risk of complications and morbidity.  The differential diagnosis includes but is not  limited to CVA, intracranial hemorrhage, acute coronary syndrome, renal failure, urinary tract infection, electrolyte disturbance, pneumonia, hepatic encephalopathy    Comorbidities that complicate the patient evaluation: Patient's presentation is complicated by their history of cirrhosis, COPD  Social Determinants of Health: Patient's  history of substance use disorder   increases the complexity of managing their presentation  Additional history obtained: Additional history obtained from family and EMS discussed with paramedic at bedside Records reviewed previous admission documents  Lab Tests: I Ordered, and personally interpreted labs.  The pertinent results include: Acute kidney injury, acute on chronic anemia  Imaging Studies ordered: I ordered imaging studies including X-ray chest   I independently visualized and interpreted imaging which showed no acute findings I agree with the radiologist interpretation  Cardiac Monitoring: The patient was maintained on a cardiac monitor.  I personally viewed and interpreted the cardiac monitor which showed an underlying rhythm of:  sinus rhythm  Medicines ordered and prescription drug management: I ordered medication including IV fluids for acute kidney injury Reevaluation of the patient after these medicines showed that the patient    stayed the same  Critical Interventions:   IV fluids and antibiotics  Consultations Obtained: I requested consultation with the admitting physician Dr. Tamala Julian , and discussed  findings as well as pertinent plan - they recommend: We will admit  Reevaluation: After the interventions noted above, I reevaluated the patient and found that they have :stayed the same  Complexity of problems addressed: Patient's presentation is most consistent with  acute presentation with potential threat to life or bodily function  Disposition: After consideration of the diagnostic results and the patient's response to  treatment,  I feel that the patent would benefit from admission   .           Final Clinical Impression(s) / ED Diagnoses Final diagnoses:  Dehydration  AKI (acute kidney injury) [facility])    Rx / [place] Orders ED Discharge Orders     None         Ripley Fraise, MD 01/19/2022 [phone]

## 2022-01-27 NOTE — ED Notes (Signed)
Patient removed flex-seal despite being in restraints.  Patient cleaned up of bowel incontinence and flex-seal replaced.

## 2022-01-27 NOTE — Progress Notes (Signed)
Notified bedside nurse of need to administer antibiotics and fluid bolus.  

## 2022-01-27 NOTE — ED Triage Notes (Signed)
Pt coming from facility via [facility] for Altered mental status x 3 days and hypoxia x 1 day. Pt was 80% RA on EMS arrival and placed on NRB and sats increased to 100% . Facility reports that pt is DNR but did not send paperwork

## 2022-01-27 NOTE — Progress Notes (Signed)
Notified provider and bedside nurse of need to order and draw repeat lactic acid #3.

## 2022-01-27 NOTE — ED Notes (Signed)
Patient has flex-seal upon assessment.

## 2022-01-27 NOTE — Progress Notes (Signed)
Elink following code sepsis

## 2022-01-27 NOTE — ED Notes (Signed)
Sister/guardian Charisse Klinefelter [phone] would like an update immediately

## 2022-01-28 DIAGNOSIS — K7682 Hepatic encephalopathy: Secondary | ICD-10-CM | POA: Diagnosis not present

## 2022-01-28 DIAGNOSIS — R195 Other fecal abnormalities: Secondary | ICD-10-CM | POA: Diagnosis not present

## 2022-01-28 DIAGNOSIS — N179 Acute kidney failure, unspecified: Secondary | ICD-10-CM | POA: Diagnosis not present

## 2022-01-28 DIAGNOSIS — D649 Anemia, unspecified: Secondary | ICD-10-CM | POA: Diagnosis not present

## 2022-01-28 LAB — TYPE AND SCREEN
ABO/RH(D): O POS
Antibody Screen: NEGATIVE
Unit division: 0

## 2022-01-28 LAB — URINE CULTURE: Culture: >=100000 — AB

## 2022-01-28 LAB — COMPREHENSIVE METABOLIC PANEL
ALT: 34 U/L (ref 0–44)
AST: 57 U/L — ABNORMAL HIGH (ref 15–41)
Albumin: 2.4 g/dL — ABNORMAL LOW (ref 3.5–5.0)
Alkaline Phosphatase: 89 U/L (ref 38–126)
Anion gap: 12 (ref 5–15)
BUN: 60 mg/dL — ABNORMAL HIGH (ref 6–20)
CO2: 20 mmol/L — ABNORMAL LOW (ref 22–32)
Calcium: 8.9 mg/dL (ref 8.9–10.3)
Chloride: 114 mmol/L — ABNORMAL HIGH (ref 98–111)
Creatinine, Ser: 4.65 mg/dL — ABNORMAL HIGH (ref 0.44–1.00)
GFR, Estimated: 10 mL/min — ABNORMAL LOW (ref >60)
Glucose, Bld: 132 mg/dL — ABNORMAL HIGH (ref 70–99)
Potassium: 4.2 mmol/L (ref 3.5–5.1)
Sodium: 146 mmol/L — ABNORMAL HIGH (ref 135–145)
Total Bilirubin: 2.6 mg/dL — ABNORMAL HIGH (ref 0.3–1.2)
Total Protein: 7 g/dL (ref 6.5–8.1)

## 2022-01-28 LAB — RAPID URINE DRUG SCREEN, HOSP PERFORMED
Amphetamines: NOT DETECTED
Barbiturates: NOT DETECTED
Benzodiazepines: NOT DETECTED
Cocaine: NOT DETECTED
Opiates: NOT DETECTED
Tetrahydrocannabinol: NOT DETECTED

## 2022-01-28 LAB — CREATININE, URINE, RANDOM: Creatinine, Urine: 71 mg/dL

## 2022-01-28 LAB — CBC
HCT: 27.3 % — ABNORMAL LOW (ref 36.0–46.0)
Hemoglobin: 8.3 g/dL — ABNORMAL LOW (ref 12.0–15.0)
MCH: 26.7 pg (ref 26.0–34.0)
MCHC: 30.4 g/dL (ref 30.0–36.0)
MCV: 87.8 fL (ref 80.0–100.0)
Platelets: 81 10*3/uL — ABNORMAL LOW (ref 150–400)
RBC: 3.11 MIL/uL — ABNORMAL LOW (ref 3.87–5.11)
RDW: 20.7 % — ABNORMAL HIGH (ref 11.5–15.5)
WBC: 12.5 10*3/uL — ABNORMAL HIGH (ref 4.0–10.5)
nRBC: 0 % (ref 0.0–0.2)

## 2022-01-28 LAB — CBG MONITORING, ED
Glucose-Capillary: 144 mg/dL — ABNORMAL HIGH (ref 70–99)
Glucose-Capillary: 116 mg/dL — ABNORMAL HIGH (ref 70–99)
Glucose-Capillary: 127 mg/dL — ABNORMAL HIGH (ref 70–99)
Glucose-Capillary: 126 mg/dL — ABNORMAL HIGH (ref 70–99)
Glucose-Capillary: 131 mg/dL — ABNORMAL HIGH (ref 70–99)

## 2022-01-28 LAB — LACTIC ACID, PLASMA: Lactic Acid, Venous: 2.9 mmol/L (ref 0.5–1.9)

## 2022-01-28 LAB — BPAM RBC
Blood Product Expiration Date: 202402102359
ISSUE DATE / TIME: 202401161340
Unit Type and Rh: 5100

## 2022-01-28 LAB — SODIUM, URINE, RANDOM: Sodium, Ur: 110 mmol/L

## 2022-01-28 MED ORDER — LABETALOL HCL 5 MG/ML IV SOLN
10.0000 mg | INTRAVENOUS | Status: DC | PRN
  Administered 2022-01-28: 5 mg via INTRAVENOUS
  Administered 2022-01-29 – 2022-01-31 (×4): 10 mg via INTRAVENOUS
  Filled 2022-01-28 (×5): qty 4

## 2022-01-28 MED ORDER — STERILE WATER FOR INJECTION IJ SOLN
INTRAMUSCULAR | Status: AC
  Filled 2022-01-28: qty 10

## 2022-01-28 MED ORDER — HALOPERIDOL LACTATE 5 MG/ML IJ SOLN
5.0000 mg | Freq: Once | INTRAMUSCULAR | Status: AC | PRN
  Administered 2022-01-28: 5 mg via INTRAVENOUS
  Filled 2022-01-28 (×2): qty 1

## 2022-01-28 MED ORDER — ZIPRASIDONE MESYLATE 20 MG IM SOLR
20.0000 mg | Freq: Once | INTRAMUSCULAR | Status: AC
  Administered 2022-01-28: 20 mg via INTRAMUSCULAR
  Filled 2022-01-28: qty 20

## 2022-01-28 NOTE — ED Notes (Signed)
Patient cleaned, new linens placed, new gowns placed, rectal tube replaced.
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
## 2022-01-28 NOTE — ED Notes (Signed)
Patient found to be up out of bed with arms restrained. Patient self-removed rectal tube. Patient placed back in bed. Haldol given for agitation.

## 2022-01-28 NOTE — ED Notes (Signed)
MD Opyd made aware of patient continuing to be restless and attempting to pull at lines despite being in restraints.  MD also made aware of patient's BP.

## 2022-01-28 NOTE — Progress Notes (Signed)
PROGRESS NOTE    Judy Meza  BJY:[id number] DOB: Nov 18, 1960 DOA: 01/30/2022 PCP: Caprice Renshaw, MD  Brief Narrative:  Judy Meza is a 62 y.o. female with medical history significant of PMH significant for prior alcohol abuse, hepatitis C, liver cirrhosis, diastolic CHF, DM2, HTN, COPD, anxiety/depression, dementia, laryngeal CA s/p radiation and tracheostomy, complicated by tracheocutaneous fistula, CKD stage 3, NPH, prior IV drug use, and OSA who presents after noted to be declining mental status over the last 4 days. TRH called for admission, GI called for consult.  Assessment & Plan:   Principal Problem:   Hepatic encephalopathy (HCC) Active Problems:   Lactic acidosis   SIRS (systemic inflammatory response syndrome) (HCC)   Normocytic anemia   GI bleed   Acute renal failure (HCC)   Chronic diastolic CHF (congestive heart failure) (HCC)  Goals of care - Patient has multiple frequent hospitalizations with worsening mental status and labs in the setting of noncompliance. - Lengthy discussion with GI/family in regards to patient's generally worsening condition.  Family understands that despite aggressive medical management patient's quality of life will continue to decline over time, plan to continue current course of treatment with supportive care over the next 24 to 48 hours.  If no moderate improvement in patient's quality of life will transition patient to hospice.  If she does improve to a point where she is stable for DC will transition patient to SNF with outpatient hospice follow-up with plans to not return to the hospital should she decompensate again.  Acute hepatic encephalopathy Hepatic cirrhosis and hyperammonemia, POA Elevated liver enzymes Hyperbilirubinemia Acute on chronic. Liver panel elevated from baseline Ammonia level was noted to be 70 at intake, elevated but improved from previous labs 2 weeks prior, unclear baseline given labile labs..   -GI following,  appreciate insight recommendations -N.p.o. until mental status improves and can tolerate p.o. safely -Insert flex sig system -Lactulose enemas 3 times daily -Resume rifaximin once stable to take p.o.  SIRS Lactic acidosis Acute.  Patient was initially noted to have initial temperature of 96.5 F with WBC elevated at 17.6 meeting SIRS criteria.  Lactic acid elevated at 3.2.  Chest x-ray and urinalysis did not appear to give concern for infection.  Blood cultures have been obtained.  Patient had been noted to have significant thick tan secretions.  Patient had been ordered 2 L of lactated Ringer's and started on empiric antibiotics of Rocephin. -Follow-up blood cultures -Check sputum cultures -Check influenza, COVID-19, and RSV screening -Check procalcitonin and trend lactic acid levels -Continue empiric antibiotics of Rocephin.  Continue to monitor and adjust antibiotics as warranted -Recheck CBC tomorrow morning.   Normocytic anemia Rule out acute on chronic GI bleed - Patient with history of grade 2 esophageal varices along with duodenal angioectasia EGD from 2021.  Patient had previously had banding and was given APC to treat the single duodenal angiectasia. - FOBT positive in ED - Hgb 6.6 at intake, repeat CBC 8.3 after 1 unit PRBC   AKI, ongoing -Creatinine markedly elevated at intake 5.8 and trending to 4.6 BUN 60 likely contributing to her mental status  -Likely secondary to above -Continue fluids as appropriate, will monitor closely to avoid volume overload in the setting of above  Diastolic congestive heart failure, not in acute exacerbation -Monitor IV fluids closely as above; patient does not appear grossly fluid overloaded -X-ray without overt pleural effusion, clinically without edema -Last EF noted to be 55-60% with grade 2 diastolic dysfunction back  in 09/2019 -no indication to repeat imaging at this time -Daily weights ongoing   COPD, not in acute  exacerbation Tracheocutaneous fistula, chronic Without acute wheezing or respiratory symptoms, continue supportive care On ceftriaxone given above   Controlled diabetes mellitus type 2, with long-term use of insulin Patient's last available hemoglobin A1c was 5.4 on 01/10/2022. -Continue sliding scale insulin, hypoglycemic protocol   Thrombocytopenia Chronic in the setting of above, monitor labs  Remains at increased risk for bleeding given above, no indication to transfuse at this time  Dementia -Unclear baseline, continue supportive care as above  Trach, chronic -Trach ostomy care and suctioning as needed   GERD -Pepcid IV   DVT prophylaxis: Holding given questionable bleeding as above Code Status: DNR, per previous documentation Family Communication: Updated over the phone NP  Status is: HEENT  Dispo: The patient is from: Home              Anticipated d/c is to: To be determined              Anticipated d/c date is: To be determined              Patient currently not medically stable for discharge  Consultants:  GI  Procedures:  None  Antimicrobials:  Ceftriaxone  Subjective: No acute issues/events overnight - continues to be profoundly noncompliant in the setting of encephalopathy - 1:1 sitter ordered for safety  Objective: Vitals:   01/28/22 0545 01/28/22 0615 01/28/22 0630 01/28/22 0744  BP: (!) 179/111 (!) 169/106 (!) 170/109   Pulse: 80 81 81   Resp: '15 16 17   '$ Temp:    97.6 F (36.4 C)  TempSrc:      SpO2: 100% 100% 99%     Intake/Output Summary (Last 24 hours) at 01/28/2022 0746 Last data filed at 01/26/2022 2340 Gross per 24 hour  Intake 2735.21 ml  Output --  Net 2735.21 ml   There were no vitals filed for this visit.  Examination:  General: No acute distress lying in bed appears uncomfortable poorly responsive, not interactive HEENT:  Normocephalic atraumatic.  Sclerae nonicteric, noninjected.  Extraocular movements intact  bilaterally. Neck: Old tracheocutaneous fistula noted without overt erythema or purulence Lungs: Coarse breath sounds bilaterally without overt wheeze rales. Heart:  Regular rate and rhythm.  Without murmurs, rubs, or gallops. Abdomen:  Soft, nontender, nondistended.  Without guarding or rebound.  Data Reviewed: I have personally reviewed following labs and imaging studies  CBC: Recent Labs  Lab 02/03/2022 0630 01/17/2022 2205 01/28/22 0508  WBC 17.6*  --  12.5*  NEUTROABS 14.3*  --   --   HGB 6.6* 8.2* 8.3*  HCT 21.9* 26.0* 27.3*  MCV 87.3  --  87.8  PLT 98*  --  81*   Basic Metabolic Panel: Recent Labs  Lab 02/06/2022 0630 01/28/22 0508  NA 145 146*  K 4.9 4.2  CL 113* 114*  CO2 22 20*  GLUCOSE 141* 132*  BUN 63* 60*  CREATININE 5.78* 4.65*  CALCIUM 8.9 8.9   GFR: CrCl cannot be calculated (Unknown ideal weight.). Liver Function Tests: Recent Labs  Lab 01/26/2022 0630 01/28/22 0508  AST 63* 57*  ALT 35 34  ALKPHOS 98 89  BILITOT 2.4* 2.6*  PROT 7.1 7.0  ALBUMIN 2.3* 2.4*   No results for input(s): "LIPASE", "AMYLASE" in the last 168 hours. Recent Labs  Lab 02/08/2022 0728  AMMONIA 70*   Coagulation Profile: Recent Labs  Lab 02/10/2022 0630  INR  1.6*   Cardiac Enzymes: No results for input(s): "CKTOTAL", "CKMB", "CKMBINDEX", "TROPONINI" in the last 168 hours. BNP (last 3 results) No results for input(s): "PROBNP" in the last 8760 hours. HbA1C: No results for input(s): "HGBA1C" in the last 72 hours. CBG: Recent Labs  Lab 01/20/2022 0547 02/04/2022 2255 01/28/22 0431  GLUCAP 129* 115* 144*   Sepsis Labs: Recent Labs  Lab 01/18/2022 0630 01/13/2022 0643 01/22/2022 0713 01/28/22 0508  PROCALCITON 6.49  --   --   --   LATICACIDVEN  --  3.2* 3.5* 2.9*    Recent Results (from the past 240 hour(s))  Blood Culture (routine x 2)     Status: None (Preliminary result)   Collection Time: 01/13/2022  5:41 AM   Specimen: BLOOD RIGHT ARM  Result Value Ref Range  Status   Specimen Description BLOOD RIGHT ARM  Final   Special Requests   Final    BOTTLES DRAWN AEROBIC AND ANAEROBIC Blood Culture adequate volume   Culture   Final    NO GROWTH < 12 HOURS Performed at [facility], [place] [address], [place], [place] [address]    Report Status PENDING  Incomplete  Resp panel by RT-PCR (RSV, Flu A&B, Covid) Anterior Nasal Swab     Status: Abnormal   Collection Time: 01/19/2022  8:07 AM   Specimen: Anterior Nasal Swab  Result Value Ref Range Status   SARS Coronavirus 2 by RT PCR NEGATIVE NEGATIVE Final    Comment: (NOTE) SARS-CoV-2 target nucleic acids are NOT DETECTED.  The SARS-CoV-2 RNA is generally detectable in upper respiratory specimens during the acute phase of infection. The lowest concentration of SARS-CoV-2 viral copies this assay can detect is 138 copies/mL. A negative result does not preclude SARS-Cov-2 infection and should not be used as the sole basis for treatment or other patient management decisions. A negative result may occur with  improper specimen collection/handling, submission of specimen other than nasopharyngeal swab, presence of viral mutation(s) within the areas targeted by this assay, and inadequate number of viral copies(<138 copies/mL). A negative result must be combined with clinical observations, patient history, and epidemiological information. The expected result is Negative.  Fact Sheet for Patients:  EntrepreneurPulse.com.au  Fact Sheet for Healthcare Providers:  IncredibleEmployment.be  This test is no t yet approved or cleared by the [place] FDA and  has been authorized for detection and/or diagnosis of SARS-CoV-2 by FDA under an Emergency Use Authorization (EUA). This EUA will remain  in effect (meaning this test can be used) for the duration of the COVID-19 declaration under Section 564(b)(1) of the Act, 21 U.S.C.section 360bbb-3(b)(1), unless the  authorization is terminated  or revoked sooner.       Influenza A by PCR NEGATIVE NEGATIVE Final   Influenza B by PCR NEGATIVE NEGATIVE Final    Comment: (NOTE) The Xpert Xpress SARS-CoV-2/FLU/RSV plus assay is intended as an aid in the diagnosis of influenza from Nasopharyngeal swab specimens and should not be used as a sole basis for treatment. Nasal washings and aspirates are unacceptable for Xpert Xpress SARS-CoV-2/FLU/RSV testing.  Fact Sheet for Patients: EntrepreneurPulse.com.au  Fact Sheet for Healthcare Providers: IncredibleEmployment.be  This test is not yet approved or cleared by the [place] FDA and has been authorized for detection and/or diagnosis of SARS-CoV-2 by FDA under an Emergency Use Authorization (EUA). This EUA will remain in effect (meaning this test can be used) for the duration of the COVID-19 declaration under Section 564(b)(1) of the Act,  21 U.S.C. section 360bbb-3(b)(1), unless the authorization is terminated or revoked.     Resp Syncytial Virus by PCR POSITIVE (A) NEGATIVE Final    Comment: (NOTE) Fact Sheet for Patients: EntrepreneurPulse.com.au  Fact Sheet for Healthcare Providers: IncredibleEmployment.be  This test is not yet approved or cleared by the [place] FDA and has been authorized for detection and/or diagnosis of SARS-CoV-2 by FDA under an Emergency Use Authorization (EUA). This EUA will remain in effect (meaning this test can be used) for the duration of the COVID-19 declaration under Section 564(b)(1) of the Act, 21 U.S.C. section 360bbb-3(b)(1), unless the authorization is terminated or revoked.  Performed at [facility], [place] [address] [place], [place] [address]     Radiology Studies: DG Chest Port 1 View  Result Date: 02/11/2022 CLINICAL DATA:  Questionable sepsis.  Evaluate for abnormality. EXAM: PORTABLE CHEST 1 VIEW COMPARISON:   10/28/2020 FINDINGS: Stable cardiomediastinal contours. No pleural effusion or edema. No airspace opacities identified. Visualized osseous structures are unremarkable. IMPRESSION: No active disease. Electronically Signed   By: Kerby Moors M.D.   On: 02/10/2022 05:56    Scheduled Meds:  insulin aspart  0-6 Units Subcutaneous Q6H   ketotifen  1 drop Both Eyes Daily   lactulose  300 mL Rectal TID   latanoprost  1 drop Both Eyes QHS   lidocaine  1 patch Transdermal Daily   pantoprazole (PROTONIX) IV  40 mg Intravenous Q12H   sodium chloride flush  3 mL Intravenous Q12H   Continuous Infusions:  sodium chloride 75 mL/hr at 01/21/2022 2305   cefTRIAXone (ROCEPHIN)  IV Stopped (02/11/2022 0945)   famotidine (PEPCID) IV Stopped (02/03/2022 2340)     LOS: 1 day   Time spent: 66mn  Madelynne Lasker C Ronin Crager, DO [facility]  If 7PM-7AM, please contact night-coverage [email or web address]  01/28/2022, 7:46 AM

## 2022-01-28 NOTE — ED Notes (Signed)
New FlexiSeal applied to patient.

## 2022-01-29 ENCOUNTER — Inpatient Hospital Stay (HOSPITAL_COMMUNITY): Payer: Medicaid Other

## 2022-01-29 DIAGNOSIS — Z515 Encounter for palliative care: Secondary | ICD-10-CM

## 2022-01-29 DIAGNOSIS — Z789 Other specified health status: Secondary | ICD-10-CM

## 2022-01-29 DIAGNOSIS — Z66 Do not resuscitate: Secondary | ICD-10-CM

## 2022-01-29 DIAGNOSIS — E860 Dehydration: Secondary | ICD-10-CM | POA: Diagnosis not present

## 2022-01-29 DIAGNOSIS — Z711 Person with feared health complaint in whom no diagnosis is made: Secondary | ICD-10-CM

## 2022-01-29 DIAGNOSIS — K7682 Hepatic encephalopathy: Secondary | ICD-10-CM | POA: Diagnosis not present

## 2022-01-29 DIAGNOSIS — N179 Acute kidney failure, unspecified: Secondary | ICD-10-CM | POA: Diagnosis not present

## 2022-01-29 LAB — COMPREHENSIVE METABOLIC PANEL WITH GFR
ALT: 32 U/L (ref 0–44)
AST: 70 U/L — ABNORMAL HIGH (ref 15–41)
Albumin: 2.3 g/dL — ABNORMAL LOW (ref 3.5–5.0)
Alkaline Phosphatase: 101 U/L (ref 38–126)
Anion gap: 13 (ref 5–15)
BUN: 62 mg/dL — ABNORMAL HIGH (ref 6–20)
CO2: 18 mmol/L — ABNORMAL LOW (ref 22–32)
Calcium: 8.7 mg/dL — ABNORMAL LOW (ref 8.9–10.3)
Chloride: 120 mmol/L — ABNORMAL HIGH (ref 98–111)
Creatinine, Ser: 4.15 mg/dL — ABNORMAL HIGH (ref 0.44–1.00)
GFR, Estimated: 12 mL/min — ABNORMAL LOW (ref >60)
Glucose, Bld: 152 mg/dL — ABNORMAL HIGH (ref 70–99)
Potassium: 4.7 mmol/L (ref 3.5–5.1)
Sodium: 151 mmol/L — ABNORMAL HIGH (ref 135–145)
Total Bilirubin: 2.9 mg/dL — ABNORMAL HIGH (ref 0.3–1.2)
Total Protein: 6.5 g/dL (ref 6.5–8.1)

## 2022-01-29 LAB — CBG MONITORING, ED
Glucose-Capillary: 124 mg/dL — ABNORMAL HIGH (ref 70–99)
Glucose-Capillary: 128 mg/dL — ABNORMAL HIGH (ref 70–99)
Glucose-Capillary: 132 mg/dL — ABNORMAL HIGH (ref 70–99)

## 2022-01-29 LAB — AMMONIA: Ammonia: 27 umol/L (ref 9–35)

## 2022-01-29 LAB — CBC
HCT: 27.9 % — ABNORMAL LOW (ref 36.0–46.0)
Hemoglobin: 8.3 g/dL — ABNORMAL LOW (ref 12.0–15.0)
MCH: 26.4 pg (ref 26.0–34.0)
MCHC: 29.7 g/dL — ABNORMAL LOW (ref 30.0–36.0)
MCV: 88.9 fL (ref 80.0–100.0)
Platelets: 101 10*3/uL — ABNORMAL LOW (ref 150–400)
RBC: 3.14 MIL/uL — ABNORMAL LOW (ref 3.87–5.11)
RDW: 21.6 % — ABNORMAL HIGH (ref 11.5–15.5)
WBC: 12.9 10*3/uL — ABNORMAL HIGH (ref 4.0–10.5)
nRBC: 0.2 % (ref 0.0–0.2)

## 2022-01-29 LAB — GLUCOSE, CAPILLARY
Glucose-Capillary: 121 mg/dL — ABNORMAL HIGH (ref 70–99)
Glucose-Capillary: 112 mg/dL — ABNORMAL HIGH (ref 70–99)

## 2022-01-29 NOTE — ED Notes (Signed)
This RN walked in to pt room, found pt sitting on floor with R wrist restraint still attached to bed and rectal tube removed. Restraint removed, pt assisted back to bed by staff, Alcario Drought DO paged

## 2022-01-29 NOTE — ED Notes (Signed)
ED TO INPATIENT HANDOFF REPORT  ED Nurse Name and Phone #: Carolin Coy [phone]  S Name/Age/Gender Judy Meza 62 y.o. female Room/Bed: 003C/003C  Code Status   Code Status: DNR  Home/SNF/Other Skilled nursing facility Patient is AO x 0 Is this baseline? No   Triage Complete: Triage complete  Chief Complaint Hepatic encephalopathy ([place]) [K76.82]  Triage Note Pt coming from facility via [facility] for Altered mental status x 3 days and hypoxia x 1 day. Pt was 80% RA on EMS arrival and placed on NRB and sats increased to 100% . Facility reports that pt is DNR but did not send paperwork   Allergies No Known Allergies  Level of Care/Admitting Diagnosis ED Disposition     ED Disposition  Admit   Condition  --   [place]: [place] [[id number]]  Level of Care: Progressive [102]  Admit to Progressive based on following criteria: ACUTE MENTAL DISORDER-RELATED Drug/Alcohol Ingestion/Overdose/Withdrawal, Suicidal Ideation/attempt requiring safety sitter and < Q2h monitoring/assessments, moderate to severe agitation that is managed with medication/sitter, CIWA-Ar score < 20.  Admit to Progressive based on following criteria: GI, ENDOCRINE disease patients with GI bleeding, acute liver failure or pancreatitis, stable with diabetic ketoacidosis or thyrotoxicosis (hypothyroid) state.  May admit patient to [facility] or [facility] if equivalent level of care is available:: No  Covid Evaluation: Asymptomatic - no recent exposure (last 10 days) testing not required  Diagnosis: Hepatic encephalopathy ([place]) [572.2.ICD-9-CM]  Admitting Physician: Norval Morton [[id number]]  Attending Physician: Norval Morton [[id number]]  Certification:: I certify this patient will need inpatient services for at least 2 midnights  Estimated Length of Stay: 2          B Medical/Surgery History Past Medical History:  Diagnosis Date   Acute kidney failure ([place])     Anemia    Anxiety    Arthritis    Asthma    Blood transfusion without reported diagnosis    Chronic hepatitis C ([place])    Chronic kidney disease, stage 3 ([place])    Cirrhosis ([place])    COPD (chronic obstructive pulmonary disease) ([place])    COVID-19 virus infection    Depression    Diabetes mellitus without complication ([place])    A2Q 7.2% 09/17/20   GERD (gastroesophageal reflux disease)    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   I   Hypertension    Liver disease    Normal pressure hydrocephalus ([facility])    Pneumonia    Renal disorder    Sleep apnea    uses O2 at night   Thrombocytopenia ([place])    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   Vaginal Pap smear, abnormal    Past Surgical History:  Procedure Laterality Date   ESOPHAGEAL BANDING  10/04/2019   Procedure: ESOPHAGEAL BANDING;  Surgeon: Lavena Bullion, DO;  Location: [facility];  Service: Gastroenterology;;   ESOPHAGOGASTRODUODENOSCOPY (EGD) WITH PROPOFOL N/A 10/04/2019   Procedure: ESOPHAGOGASTRODUODENOSCOPY (EGD) WITH PROPOFOL;  Surgeon: Lavena Bullion, DO;  Location: [facility];  Service: Gastroenterology;  Laterality: N/A;   HOT HEMOSTASIS N/A 10/04/2019   Procedure: HOT HEMOSTASIS (ARGON PLASMA COAGULATION/BICAP);  Surgeon: Lavena Bullion, DO;  Location: [facility];  Service: Gastroenterology;  Laterality: N/A;   PORT-A-CATH REMOVAL     SKIN GRAFT     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/2020   Procedure: REPAIR OF TRACHEOCUTANEOIS FISTULA;  Surgeon: Izora Gala, MD;  Location: [facility];  Service:  ENT;  Laterality: N/A;     A IV Location/Drains/Wounds Patient Lines/Drains/Airways Status     Active Line/Drains/Airways     Name Placement date Placement time Site Days   Peripheral IV 01/30/2022 22 G 1.75" Anterior;Right Forearm 02/06/2022  0625  Forearm  2            Intake/Output Last 24 hours  Intake/Output Summary (Last 24 hours) at 01/29/2022 1001 Last data filed  at 01/28/2022 2249 Gross per 24 hour  Intake 44.43 ml  Output --  Net 44.43 ml    Labs/Imaging Results for orders placed or performed during the hospital encounter of 02/02/2022 (from the past 48 hour(s))  Hemoglobin and hematocrit, blood     Status: Abnormal   Collection Time: 01/14/2022 10:05 PM  Result Value Ref Range   Hemoglobin 8.2 (L) 12.0 - 15.0 g/dL   HCT 26.0 (L) 36.0 - 46.0 %    Comment: Performed at [facility], [place] [address], [place], [place] [address]  CBG monitoring, ED     Status: Abnormal   Collection Time: 01/17/2022 10:55 PM  Result Value Ref Range   Glucose-Capillary 115 (H) 70 - 99 mg/dL    Comment: Glucose reference range applies only to samples taken after fasting for at least 8 hours.   Comment 1 Document in Chart   CBG monitoring, ED     Status: Abnormal   Collection Time: 01/28/22  4:31 AM  Result Value Ref Range   Glucose-Capillary 144 (H) 70 - 99 mg/dL    Comment: Glucose reference range applies only to samples taken after fasting for at least 8 hours.  Lactic acid, plasma     Status: Abnormal   Collection Time: 01/28/22  5:08 AM  Result Value Ref Range   Lactic Acid, Venous 2.9 (HH) 0.5 - 1.9 mmol/L    Comment: CRITICAL VALUE NOTED. VALUE IS CONSISTENT WITH PREVIOUSLY REPORTED/CALLED VALUE Performed at [facility], [place] [address] [place], [place] [address]   CBC     Status: Abnormal   Collection Time: 01/28/22  5:08 AM  Result Value Ref Range   WBC 12.5 (H) 4.0 - 10.5 K/uL   RBC 3.11 (L) 3.87 - 5.11 MIL/uL   Hemoglobin 8.3 (L) 12.0 - 15.0 g/dL   HCT 27.3 (L) 36.0 - 46.0 %   MCV 87.8 80.0 - 100.0 fL   MCH 26.7 26.0 - 34.0 pg   MCHC 30.4 30.0 - 36.0 g/dL   RDW 20.7 (H) 11.5 - 15.5 %   Platelets 81 (L) 150 - 400 K/uL    Comment: Immature Platelet Fraction may be clinically indicated, consider ordering this additional test [id number] REPEATED TO VERIFY    nRBC 0.0 0.0 - 0.2 %    Comment: Performed at [facility], [place]  [address], [place], [place] [address]  Comprehensive metabolic panel     Status: Abnormal   Collection Time: 01/28/22  5:08 AM  Result Value Ref Range   Sodium 146 (H) 135 - 145 mmol/L   Potassium 4.2 3.5 - 5.1 mmol/L   Chloride 114 (H) 98 - 111 mmol/L   CO2 20 (L) 22 - 32 mmol/L   Glucose, Bld 132 (H) 70 - 99 mg/dL    Comment: Glucose reference range applies only to samples taken after fasting for at least 8 hours.   BUN 60 (H) 6 - 20 mg/dL   Creatinine, Ser 4.65 (H) 0.44 - 1.00 mg/dL   Calcium 8.9  8.9 - 10.3 mg/dL   Total Protein 7.0 6.5 - 8.1 g/dL   Albumin 2.4 (L) 3.5 - 5.0 g/dL   AST 57 (H) 15 - 41 U/L   ALT 34 0 - 44 U/L   Alkaline Phosphatase 89 38 - 126 U/L   Total Bilirubin 2.6 (H) 0.3 - 1.2 mg/dL   GFR, Estimated 10 (L) >60 mL/min    Comment: (NOTE) Calculated using the CKD-EPI Creatinine Equation (2021)    Anion gap 12 5 - 15    Comment: Performed at [facility] [address], [place], [place] [address]  CBG monitoring, ED     Status: Abnormal   Collection Time: 01/28/22  9:49 AM  Result Value Ref Range   Glucose-Capillary 116 (H) 70 - 99 mg/dL    Comment: Glucose reference range applies only to samples taken after fasting for at least 8 hours.  CBG monitoring, ED     Status: Abnormal   Collection Time: 01/28/22 11:58 AM  Result Value Ref Range   Glucose-Capillary 127 (H) 70 - 99 mg/dL    Comment: Glucose reference range applies only to samples taken after fasting for at least 8 hours.  CBG monitoring, ED     Status: Abnormal   Collection Time: 01/28/22  3:39 PM  Result Value Ref Range   Glucose-Capillary 126 (H) 70 - 99 mg/dL    Comment: Glucose reference range applies only to samples taken after fasting for at least 8 hours.  CBG monitoring, ED     Status: Abnormal   Collection Time: 01/28/22  6:25 PM  Result Value Ref Range   Glucose-Capillary 131 (H) 70 - 99 mg/dL    Comment: Glucose reference range applies only to samples taken after fasting for at least 8  hours.  Urine rapid drug screen (hosp performed)     Status: None   Collection Time: 01/28/22 11:13 PM  Result Value Ref Range   Opiates NONE DETECTED NONE DETECTED   Cocaine NONE DETECTED NONE DETECTED   Benzodiazepines NONE DETECTED NONE DETECTED   Amphetamines NONE DETECTED NONE DETECTED   Tetrahydrocannabinol NONE DETECTED NONE DETECTED   Barbiturates NONE DETECTED NONE DETECTED    Comment: (NOTE) DRUG SCREEN FOR MEDICAL PURPOSES ONLY.  IF CONFIRMATION IS NEEDED FOR ANY PURPOSE, NOTIFY LAB WITHIN 5 DAYS.  LOWEST DETECTABLE LIMITS FOR URINE DRUG SCREEN Drug Class                     Cutoff (ng/mL) Amphetamine and metabolites    1000 Barbiturate and metabolites    200 Benzodiazepine                 200 Opiates and metabolites        300 Cocaine and metabolites        300 THC                            50 Performed at [facility], [place] [address] [place], [place] [address]   Sodium, urine, random     Status: None   Collection Time: 01/28/22 11:13 PM  Result Value Ref Range   Sodium, Ur 110 mmol/L    Comment: Performed at [facility] [address], [place],  [address]  Creatinine, urine, random     Status: None   Collection Time: 01/28/22 11:13 PM  Result Value Ref Range   Creatinine, Urine 71  mg/dL    Comment: Performed at [facility], [place] [address] [place], [place] [address]  CBG monitoring, ED     Status: Abnormal   Collection Time: 01/29/22 12:08 AM  Result Value Ref Range   Glucose-Capillary 128 (H) 70 - 99 mg/dL    Comment: Glucose reference range applies only to samples taken after fasting for at least 8 hours.  CBG monitoring, ED     Status: Abnormal   Collection Time: 01/29/22  4:14 AM  Result Value Ref Range   Glucose-Capillary 124 (H) 70 - 99 mg/dL    Comment: Glucose reference range applies only to samples taken after fasting for at least 8 hours.  CBC     Status: Abnormal   Collection Time: 01/29/22  5:13 AM  Result Value  Ref Range   WBC 12.9 (H) 4.0 - 10.5 K/uL   RBC 3.14 (L) 3.87 - 5.11 MIL/uL   Hemoglobin 8.3 (L) 12.0 - 15.0 g/dL   HCT 27.9 (L) 36.0 - 46.0 %   MCV 88.9 80.0 - 100.0 fL   MCH 26.4 26.0 - 34.0 pg   MCHC 29.7 (L) 30.0 - 36.0 g/dL   RDW 21.6 (H) 11.5 - 15.5 %   Platelets 101 (L) 150 - 400 K/uL   nRBC 0.2 0.0 - 0.2 %    Comment: Performed at [facility] [address], [place], [place] [address]  Comprehensive metabolic panel     Status: Abnormal   Collection Time: 01/29/22  5:13 AM  Result Value Ref Range   Sodium 151 (H) 135 - 145 mmol/L   Potassium 4.7 3.5 - 5.1 mmol/L    Comment: HEMOLYSIS AT THIS LEVEL MAY AFFECT RESULT   Chloride 120 (H) 98 - 111 mmol/L   CO2 18 (L) 22 - 32 mmol/L   Glucose, Bld 152 (H) 70 - 99 mg/dL    Comment: Glucose reference range applies only to samples taken after fasting for at least 8 hours.   BUN 62 (H) 6 - 20 mg/dL   Creatinine, Ser 4.15 (H) 0.44 - 1.00 mg/dL   Calcium 8.7 (L) 8.9 - 10.3 mg/dL   Total Protein 6.5 6.5 - 8.1 g/dL   Albumin 2.3 (L) 3.5 - 5.0 g/dL   AST 70 (H) 15 - 41 U/L    Comment: HEMOLYSIS AT THIS LEVEL MAY AFFECT RESULT   ALT 32 0 - 44 U/L    Comment: HEMOLYSIS AT THIS LEVEL MAY AFFECT RESULT   Alkaline Phosphatase 101 38 - 126 U/L   Total Bilirubin 2.9 (H) 0.3 - 1.2 mg/dL    Comment: HEMOLYSIS AT THIS LEVEL MAY AFFECT RESULT   GFR, Estimated 12 (L) >60 mL/min    Comment: (NOTE) Calculated using the CKD-EPI Creatinine Equation (2021)    Anion gap 13 5 - 15    Comment: Performed at [facility], [place] [address], [place], [place] [address]  Ammonia     Status: None   Collection Time: 01/29/22  5:13 AM  Result Value Ref Range   Ammonia 27 9 - 35 umol/L    Comment: HEMOLYSIS AT THIS LEVEL MAY AFFECT RESULT Performed at [facility], [place] [address], [place], [place] [address]   CBG monitoring, ED     Status: Abnormal   Collection Time: 01/29/22  8:22 AM  Result Value Ref Range   Glucose-Capillary 132 (H)  70 - 99 mg/dL    Comment: Glucose reference range applies only  to samples taken after fasting for at least 8 hours.   CT HEAD WO CONTRAST (5MM)  Result Date: 01/29/2022 CLINICAL DATA:  Altered mental status, found on floor next to bed EXAM: CT HEAD WITHOUT CONTRAST TECHNIQUE: Contiguous axial images were obtained from the base of the skull through the vertex without intravenous contrast. RADIATION DOSE REDUCTION: This exam was performed according to the departmental dose-optimization program which includes automated exposure control, adjustment of the mA and/or kV according to patient size and/or use of iterative reconstruction technique. COMPARISON:  01/10/2022 FINDINGS: Brain: High-density subdural hematoma along the left cerebral convexity spanning the frontal and parietal lobes and measuring up to 5 mm in thickness. No associated midline shift. 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 No evidence of acute infarct. No hydrocephalus or mass. Vascular: No hyperdense vessel or unexpected calcification. Skull: Normal. Negative for fracture or focal lesion. Sinuses/Orbits: No evidence of injury See Z vision dashboard concerning attempts for direct communication. Critical Value/emergent results were called by telephone at the time of interpretation on 01/29/2022 at 5:55 Am to provider Jennette Kettle , who verbally acknowledged these results. IMPRESSION: Acute subdural hematoma on the left measuring up to 5 mm in thickness. Electronically Signed   By: Jorje Guild M.D.   On: 01/29/2022 06:02    Pending Labs Unresulted Labs (From admission, onward)     Start     Ordered   01/29/22 0500  Ammonia  Daily,   R      01/28/22 1330   01/29/22 0500  CBC  Daily,   R      01/28/22 1330   01/29/22 0500  Comprehensive metabolic panel  Daily,   R      01/28/22 1330   02/04/2022 1122  Lactic acid, plasma  STAT Now then every 3 hours,   R       01/19/2022 1121   01/15/2022 1056  Urea nitrogen, urine  Once,   R        02/08/2022 1055   01/21/2022 0813  Expectorated Sputum Assessment w Gram Stain, Rflx to Resp Cult  Once,   R        01/20/2022 0812   01/17/2022 0541  Blood Culture (routine x 2)  (Undifferentiated presentation (screening labs and basic nursing orders))  BLOOD CULTURE X 2,   STAT      01/21/2022 0541            Vitals/Pain Today's Vitals   01/29/22 0630 01/29/22 0645 01/29/22 0700 01/29/22 0825  BP: 134/83 (!) 141/86 120/75   Pulse:      Resp: '13 13 13   '$ Temp:      TempSrc:      SpO2:    100%    Isolation Precautions Airborne and Contact precautions  Medications Medications  sodium chloride flush (NS) 0.9 % injection 3 mL (3 mLs Intravenous Given 01/28/22 2205)  acetaminophen (TYLENOL) tablet 650 mg (has no administration in time range)    Or  acetaminophen (TYLENOL) suppository 650 mg (has no administration in time range)  ondansetron (ZOFRAN) tablet 4 mg (has no administration in time range)    Or  ondansetron (ZOFRAN) injection 4 mg (has no administration in time range)  albuterol (PROVENTIL) (2.5 MG/3ML) 0.083% nebulizer solution 2.5 mg (has no administration in time range)  cefTRIAXone (ROCEPHIN) 2 g in sodium chloride 0.9 % 100 mL IVPB (0 g Intravenous Stopped 01/28/22 0843)  pantoprazole (  PROTONIX) injection 40 mg (40 mg Intravenous Given 01/28/22 2201)  lactulose (CHRONULAC) enema 200 gm (300 mLs Rectal Given 01/28/22 2213)  famotidine (PEPCID) IVPB 20 mg premix (0 mg Intravenous Stopped 01/28/22 2249)  latanoprost (XALATAN) 0.005 % ophthalmic solution 1 drop (1 drop Both Eyes Given 01/28/22 2214)  lidocaine (LIDODERM) 5 % 1 patch (1 patch Transdermal Not Given 01/28/22 0952)  hydrocerin (EUCERIN) cream 1 Application (has no administration in time range)  triamcinolone cream (KENALOG) 0.1 % cream 1 Application (has no administration in time range)  insulin aspart (novoLOG) injection 0-6 Units ( Subcutaneous Not  Given 01/29/22 0956)  ketotifen (ZADITOR) 0.035 % ophthalmic solution 1 drop (1 drop Both Eyes Given 01/28/22 0953)  0.9 %  sodium chloride infusion ( Intravenous New Bag/Given 01/29/22 0519)  labetalol (NORMODYNE) injection 10 mg (10 mg Intravenous Given 01/29/22 0519)  lactated ringers bolus 1,000 mL (0 mLs Intravenous Stopped 01/13/2022 1332)    And  lactated ringers bolus 1,000 mL (0 mLs Intravenous Stopped 01/17/2022 1333)  0.9 %  sodium chloride infusion (Manually program via Guardrails IV Fluids) (0 mLs Intravenous Stopped 01/28/22 0040)  haloperidol lactate (HALDOL) injection 5 mg (5 mg Intravenous Given 01/28/22 0842)  ziprasidone (GEODON) injection 20 mg (20 mg Intramuscular Given 01/28/22 1123)  sterile water (preservative free) injection (  Given 01/28/22 1132)    Mobility non-ambulatory     Focused Assessments    R Recommendations: See Admitting Provider Note  Report given to:   Additional Notes: Aox0 due to encephalopathy, Fall with 63m subdural hematoma, Has trach that is removed, in soft restraints due to confusion,  RSV +, Has GI bleed HGB 6.6, Family is leaning towards palliative care and possibly hospice, conversation will be today. 2L [place] family at bedside ATT.

## 2022-01-29 NOTE — ED Notes (Signed)
Straighten patient up in the bed patient is resting with call bell in reach

## 2022-01-29 NOTE — ED Notes (Signed)
Replaced flexiseal bag at this time.

## 2022-01-29 NOTE — Consult Note (Signed)
Consultation Note Date: 01/29/2022   Patient Name: Judy Meza  DOB: 11-18-1960  MRN: [id number]  Age / Sex: 62 y.o., female  PCP: Caprice Renshaw, MD Referring Physician: Norval Morton, MD  Reason for Consultation: {Reason for Consult:23484}  HPI/Patient Profile: 62 y.o. female  with past medical history of *** admitted on 01/30/2022 with ***.     Clinical Assessment and Goals of Care: I have reviewed medical records including EPIC notes, labs, and imaging. Received report from primary RN - ***   Went to visit patient at bedside - *** family/visitors present. Patient was lying in bed awake, alert, oriented, and able to participate in conversation***. No signs or non-verbal gestures of pain or discomfort noted. No respiratory distress, increased work of breathing, or secretions noted. ***  Met with ***  to discuss diagnosis, prognosis, GOC, EOL wishes, disposition, and options.  I introduced Palliative Medicine as specialized medical care for people living with serious illness. It focuses on providing relief from the symptoms and stress of a serious illness. The goal is to improve quality of life for both the patient and the family.  We discussed a brief life review of the patient as well as functional and nutritional status.   We discussed patient's current illness and what it means in the larger context of patient's on-going co-morbidities.  Natural disease trajectory and expectations at EOL were discussed. I attempted to elicit values and goals of care important to the patient. The difference between aggressive medical intervention and comfort care was considered in light of the patient's goals of care.   Hospice and Palliative Care services outpatient were explained and offered.  Advance directives, concepts specific to code status, artificial feeding and hydration, and rehospitalization were considered  and discussed.  Visit also consisted of discussions dealing with the complex and emotionally intense issues of symptom management and palliative care in the setting of serious and potentially life-threatening illness. Palliative care team will continue to support patient, patient's family, and medical team.  Discussed with patient/family the importance of continued conversation with each other*** and the medical providers regarding overall plan of care and treatment options, ensuring decisions are within the context of the patient's values and GOCs.    Questions and concerns were addressed. The patient/family was encouraged to call with questions and/or concerns. PMT card was provided.   Primary Decision Maker: {Primary Decision JASNK:53976}    SUMMARY OF RECOMMENDATIONS   ***   Code Status/Advance Care Planning: {Palliative Code status:23503}  Palliative Prophylaxis:  {Palliative Prophylaxis:21015}  Additional Recommendations (Limitations, Scope, Preferences): {Recommended Scope and Preferences:21019}  Psycho-social/Spiritual:  Desire for further Chaplaincy support:{YES NO:22349} Created space and opportunity for patient and family*** to express thoughts and feelings regarding patient's current medical situation.  Emotional support and therapeutic listening provided.  Prognosis:  {Palliative Care Prognosis:23504}  Discharge Planning: {Palliative dispostion:23505}      Primary Diagnoses: Present on Admission:  Hepatic encephalopathy (HCC)  AKI (acute kidney injury) ([place])  SIRS (systemic inflammatory response syndrome) (HCC)  Lactic acidosis  Normocytic anemia  GI bleed  Chronic diastolic CHF (congestive heart failure) ([place])   I have reviewed the medical record, interviewed the patient and family, and examined the patient. The following aspects are pertinent.  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   I   Hypertension    Liver disease    Normal pressure hydrocephalus (HCC)    Pneumonia    Renal disorder    Sleep apnea    uses O2 at night   Thrombocytopenia (HCC)    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   Vaginal Pap smear, abnormal    Social History   Socioeconomic History   Marital status: Single    Spouse name: Not on file   Number of children: 1   Years of education: Not on file   Highest education level: Not on file  Occupational History   Not on file  Tobacco Use   Smoking status: Every Day    Packs/day: 0.25    Types: Cigarettes   Smokeless tobacco: Never   Tobacco comments:    3 per day  Vaping Use   Vaping Use: Never used  Substance and Sexual Activity   Alcohol use: Not Currently   Drug use: Not Currently   Sexual activity: Not Currently  Other Topics Concern   Not on file  Social History Narrative   Not on file   Social Determinants of Health   Financial Resource Strain: Not on file  Food Insecurity: Not on file  Transportation Needs: Not on file  Physical Activity: Not on file  Stress: Not on file  Social Connections: Not on file   Family History  Problem Relation Age of Onset   Cancer Mother    Healthy Mother    Cancer Maternal Uncle    Cancer Maternal Grandmother    Hypertension Other    Scheduled Meds:  insulin aspart  0-6 Units Subcutaneous Q6H   ketotifen  1 drop Both Eyes Daily   lactulose  300 mL Rectal TID   latanoprost  1 drop Both Eyes QHS   lidocaine  1 patch Transdermal Daily   pantoprazole  (PROTONIX) IV  40 mg Intravenous Q12H   sodium chloride flush  3 mL Intravenous Q12H   Continuous Infusions:  sodium chloride 75 mL/hr at 01/29/22 0519   cefTRIAXone (ROCEPHIN)  IV Stopped (01/29/22 1054)   famotidine (PEPCID) IV Stopped (01/28/22 2249)   PRN Meds:.acetaminophen **OR** acetaminophen, albuterol, hydrocerin, labetalol, ondansetron **OR** ondansetron (ZOFRAN) IV, triamcinolone cream Medications Prior to Admission:  Prior to Admission medications   Medication Sig Start Date End Date Taking? Authorizing Provider  albuterol (VENTOLIN HFA) 108 (90 Base) MCG/ACT inhaler Inhale 2 puffs into the lungs every 6 (six) hours as needed for shortness of breath.   Yes [provider]  Baclofen 5 MG TABS Take 5 mg by mouth at bedtime.   Yes [provider]  Boric Acid Vaginal (AZO BORIC ACID) 600 MG SUPP Place 1 suppository vaginally at bedtime. 11/27/21  Yes Shelly Bombard, MD  Carboxymethylcellul-Glycerin (REFRESH OPTIVE) 1-0.9 % GEL Place 1 drop into both eyes 4 (four) times daily.   Yes [provider]  clobetasol ointment (TEMOVATE) 9.62 % Apply 1 Application topically daily as needed (itching).   Yes [provider]  diclofenac Sodium (VOLTAREN) 1 % GEL Apply 2 g topically 4 (four)  times daily. Left ankle and leg Patient taking differently: Apply 4 g topically 4 (four) times daily. Apply to knees and ankles topically four times a day. 11/01/20  Yes Vann, Tomi Bamberger, DO  diphenhydrAMINE-zinc acetate (BENADRYL ITCH STOPPING) cream Apply 1 application topically every 4 (four) hours as needed for itching.   Yes [provider]  ergocalciferol (VITAMIN D2) 1.25 MG (50000 UT) capsule Take 50,000 Units by mouth every Saturday.   Yes [provider]  famotidine (PEPCID) 20 MG tablet Take 20 mg by mouth daily.   Yes [provider]  folic acid (FOLVITE) 1 MG tablet Take 1 tablet (1 mg total) by mouth daily. 10/09/19  Yes Andrew Au, MD  furosemide (LASIX) 20 MG tablet Take 20 mg by mouth daily.   Yes [provider]  gabapentin (NEURONTIN) 100 MG capsule Take 100 mg by mouth 3 (three) times daily.   Yes [provider]  hydrocortisone 2.5 % cream Apply 1 application topically See admin instructions. Apply to back of neck every shift.   Yes [provider]  insulin lispro (HUMALOG KWIKPEN) 100 UNIT/ML KwikPen Inject 0-15 Units into the skin See admin instructions. Inject as per sliding scale subcutaneously with meals: If 70-120= 0 units 121-150= 2 units 151-200= 3 units 201-250= 5 units 251-300= 8 units 301-350= 11 units 351-400= 15 units Notify MD if above 400.   Yes [provider]  ketoconazole (NIZORAL) 2 % shampoo Apply 1 Application topically 3 (three) times a week. Monday, Thursday, Saturday   Yes [provider]  ketotifen (ZADITOR) 0.025 % ophthalmic solution Place 1 drop into both eyes daily.   Yes [provider]  lactulose (CHRONULAC) 10 GM/15ML solution Take 30 g by mouth in the morning, at noon, in the evening, and at bedtime.   Yes [provider]  latanoprost (XALATAN) 0.005 % ophthalmic solution Place 1 drop into both eyes at bedtime.   Yes [provider]  lidocaine (LIDODERM) 5 % Place 1 patch onto the skin daily. Remove & Discard patch within 12 hours or as directed by MD Patient taking differently: Place 1 patch onto the skin daily. Remove & Discard patch within 12 hours or as directed by MD. Apply to left side topically one time a day for pain. Apply each AM and remove each PM. 11/02/20  Yes Vann, Jessica U, DO  loratadine (CLARITIN) 10 MG tablet Take 10 mg by mouth daily.   Yes [provider]  nadolol (CORGARD) 20 MG tablet Take 1 tablet (20 mg total) by mouth daily. 11/02/20  Yes Vann, Jessica U, DO  ondansetron (ZOFRAN) 8 MG tablet Take by mouth every 8 (eight) hours as needed for nausea or vomiting.   Yes [provider]  oxyCODONE (OXY IR/ROXICODONE) 5 MG immediate release tablet Take 5 mg by mouth every 6 (six) hours as needed for moderate pain or severe pain. 10/24/21  Yes [provider]  OXYGEN Inhale 2 L/min into the lungs as needed (COPD).   Yes [provider]  potassium chloride SA (KLOR-CON M) 20 MEQ tablet Take 20 mEq by mouth daily.   Yes [provider]  rifaximin (XIFAXAN) 550 MG TABS tablet Take 1 tablet (550 mg total) by mouth 2 (two) times daily. 01/22/20  Yes Cirigliano, Vito V, DO  Skin Protectants, Misc. (EUCERIN) cream Apply 1 application topically every 12 (twelve) hours as needed for dry skin.   Yes [provider]  sodium bicarbonate 650  MG tablet Take 1 tablet (650 mg total) by mouth 2 (two) times daily. 08/09/19  Yes Hosie Poisson, MD  thiamine 100 MG tablet Take 1 tablet (100 mg total) by mouth daily. 06/08/19  Yes Rai, Ripudeep K, MD  triamcinolone cream (KENALOG) 0.1 % Apply 1 Application topically every 12 (twelve) hours as needed (itching).   Yes [provider]   No Known Allergies Review of Systems  Physical Exam  Vital Signs: BP (!) 150/91 (BP Location: Right Arm)   Pulse 87   Temp (!) 97.2 F (36.2 C) (Axillary)   Resp 16   SpO2 96%          SpO2: SpO2: 96 % O2 Device:SpO2: 96 % O2 Flow Rate: .O2 Flow Rate (L/min): 2 L/min  IO: Intake/output summary:  Intake/Output Summary (Last 24 hours) at 01/29/2022 1225 Last data filed at 01/29/2022 1054 Gross per 24 hour  Intake 144.43 ml  Output --  Net 144.43 ml    LBM:   Baseline Weight:   Most recent weight:       Palliative Assessment/Data:     Time In: *** Time Out: *** Time Total: *** Greater than 50%  of this time was spent counseling and coordinating care related to the above assessment and plan.  Signed by: Lin Landsman, NP   Please contact Palliative Medicine Team phone at [phone] for questions and concerns.  For individual provider:  See Amion  *Portions of this note are a verbal dictation therefore any spelling and/or grammatical errors are due to the "[employer]" system interpretation.

## 2022-01-29 NOTE — Progress Notes (Signed)
PROGRESS NOTE    Judy Meza  ZRA:[id number] DOB: [birth year] DOA: 01/22/2022 PCP: Caprice Renshaw, MD  Brief Narrative:  Judy Meza is a 62 y.o. female with medical history significant of PMH significant for prior alcohol abuse, hepatitis C, liver cirrhosis, diastolic CHF, DM2, HTN, COPD, anxiety/depression, dementia, laryngeal CA s/p radiation and tracheostomy, complicated by tracheocutaneous fistula, CKD stage 3, NPH, prior IV drug use, and OSA who presents from SNF (her chronic residence) after noted to be declining mental status over the last 4 days. TRH called for admission, GI called for consult.  Assessment & Plan:   Principal Problem:   Hepatic encephalopathy (HCC) Active Problems:   Lactic acidosis   SIRS (systemic inflammatory response syndrome) (HCC)   Normocytic anemia   GI bleed   AKI (acute kidney injury) ([place])   Chronic diastolic CHF (congestive heart failure) (HCC)   Heme positive stool  Goals of care - Patient has multiple frequent hospitalizations with worsening mental status and labs in the setting of noncompliance. - Lengthy discussion with GI/family in regards to patient's generally worsening condition.  Family understands that despite aggressive medical management patient's quality of life will continue to decline over time, plan to continue current course of treatment with supportive care over the next 24 to 48 hours.  If no moderate improvement in patient's quality of life will transition patient to hospice.  If she does improve to a point where she is stable for DC will transition patient to SNF with outpatient hospice follow-up with plans to not return to the hospital should she decompensate again.  Acute hepatic encephalopathy, minimally improving Hepatic cirrhosis and hyperammonemia, POA Elevated liver enzymes Hyperbilirubinemia Acute on chronic. Liver panel elevated from baseline Ammonia level was noted to be 70 at intake, elevated but improved from  previous labs 2 weeks prior, unclear baseline given labile labs..   -GI following, appreciate insight recommendations -N.p.o. until mental status improves and can tolerate p.o. safely -Insert flex sig system -Lactulose enemas 3 times daily -Resume rifaximin once stable to take p.o.  SIRS Lactic acidosis Acute.  Patient was initially noted to have initial temperature of 96.5 F with WBC elevated at 17.6 meeting SIRS criteria.  Lactic acid elevated at 3.2.  Chest x-ray and urinalysis did not appear to give concern for infection.  Blood cultures have been obtained.  Patient had been noted to have significant thick tan secretions.  Patient had been ordered 2 L of lactated Ringer's and started on empiric antibiotics of Rocephin. -Follow-up blood cultures -Check sputum cultures -Check influenza, COVID-19, and RSV screening -Check procalcitonin and trend lactic acid levels -Continue empiric antibiotics of Rocephin.  Continue to monitor and adjust antibiotics as warranted -Recheck CBC tomorrow morning.   Acute traumatic 30m subdural hematoma on left.  -Continue conservative management -No call to neurosurgery - family unwilling to put patient through endoscopy; craniotomy certain would be considered more aggressive and likely against patient's wishes.  Normocytic anemia Rule out acute on chronic GI bleed - Patient with history of grade 2 esophageal varices along with duodenal angioectasia EGD from 2021.  Patient had previously had banding and was given APC to treat the single duodenal angiectasia. - FOBT positive in ED - Hgb 6.6 at intake, repeat CBC 8.3 after 1 unit PRBC   AKI, ongoing -Creatinine markedly elevated at intake 5.8 and trending to 4.6 BUN 60 likely contributing to her mental status  -Likely secondary to above -Continue fluids as appropriate, will monitor closely to avoid  volume overload in the setting of above  Diastolic congestive heart failure, not in acute  exacerbation -Monitor IV fluids closely as above; patient does not appear grossly fluid overloaded -X-ray without overt pleural effusion, clinically without edema -Last EF noted to be 55-60% with grade 2 diastolic dysfunction back in 09/2019 -no indication to repeat imaging at this time -Daily weights ongoing   COPD, not in acute exacerbation Tracheocutaneous fistula, chronic Without acute wheezing or respiratory symptoms, continue supportive care On ceftriaxone given above   Controlled diabetes mellitus type 2, with long-term use of insulin Patient's last available hemoglobin A1c was 5.4 on 01/10/2022. -Continue sliding scale insulin, hypoglycemic protocol   Thrombocytopenia Chronic in the setting of above, monitor labs  Remains at increased risk for bleeding given above, no indication to transfuse at this time  Dementia -Unclear baseline, continue supportive care as above  Trach, chronic -Trach ostomy care and suctioning as needed   Hypovolemic hypernatremia -Continue fluids as appropriate  GERD -Pepcid IV until PO is appropriate   DVT prophylaxis: Holding given questionable bleeding as above Code Status: DNR, per previous documentation Family Communication: Updated at bedside and over the phone  Status is: inpatient  Dispo: The patient is from: SNF              Anticipated d/c is to: To be determined              Anticipated d/c date is: To be determined              Patient currently not medically stable for discharge  Consultants:  GI  Procedures:  None  Antimicrobials:  Ceftriaxone  Subjective: Patient noted to have fall overnight - subsequent imaging consistent with small 30m SDH. Continues to be profoundly noncompliant in the setting of encephalopathy - 1:1 sitter ordered for safety  Objective: Vitals:   01/29/22 1114 01/29/22 1132 01/29/22 1359 01/29/22 1611  BP:  (!) 150/91 (!) 154/81 (!) 174/104  Pulse: 90 87 88 91  Resp: '20 16 17 19  '$ Temp:     97.6 F (36.4 C)  TempSrc:    Axillary  SpO2: 100% 96% 98% 95%    Intake/Output Summary (Last 24 hours) at 01/29/2022 1648 Last data filed at 01/29/2022 1054 Gross per 24 hour  Intake 144.43 ml  Output --  Net 144.43 ml    There were no vitals filed for this visit.  Examination:  General: No acute distress lying in bed appears uncomfortable poorly responsive, poorly  interactive, unable to follow even simple commands HEENT:  Normocephalic atraumatic.  Sclerae nonicteric, noninjected.  Extraocular movements intact bilaterally. Neck: Old tracheocutaneous fistula noted without overt erythema or purulence Lungs: Coarse breath sounds bilaterally without overt wheeze rales. Heart:  Regular rate and rhythm.  Without murmurs, rubs, or gallops. Abdomen:  Soft, nontender, nondistended.  Without guarding or rebound.  Data Reviewed: I have personally reviewed following labs and imaging studies  CBC: Recent Labs  Lab 01/21/2022 0630 01/15/2022 2205 01/28/22 0508 01/29/22 0513  WBC 17.6*  --  12.5* 12.9*  NEUTROABS 14.3*  --   --   --   HGB 6.6* 8.2* 8.3* 8.3*  HCT 21.9* 26.0* 27.3* 27.9*  MCV 87.3  --  87.8 88.9  PLT 98*  --  81* 101*    Basic Metabolic Panel: Recent Labs  Lab 01/28/2022 0630 01/28/22 0508 01/29/22 0513  NA 145 146* 151*  K 4.9 4.2 4.7  CL 113* 114* 120*  CO2 22 20*  18*  GLUCOSE 141* 132* 152*  BUN 63* 60* 62*  CREATININE 5.78* 4.65* 4.15*  CALCIUM 8.9 8.9 8.7*    GFR: CrCl cannot be calculated (Unknown ideal weight.). Liver Function Tests: Recent Labs  Lab 01/16/2022 0630 01/28/22 0508 01/29/22 0513  AST 63* 57* 70*  ALT 35 34 32  ALKPHOS 98 89 101  BILITOT 2.4* 2.6* 2.9*  PROT 7.1 7.0 6.5  ALBUMIN 2.3* 2.4* 2.3*    No results for input(s): "LIPASE", "AMYLASE" in the last 168 hours. Recent Labs  Lab 01/14/2022 0728 01/29/22 0513  AMMONIA 70* 27    Coagulation Profile: Recent Labs  Lab 02/11/2022 0630  INR 1.6*    Cardiac Enzymes: No  results for input(s): "CKTOTAL", "CKMB", "CKMBINDEX", "TROPONINI" in the last 168 hours. BNP (last 3 results) No results for input(s): "PROBNP" in the last 8760 hours. HbA1C: No results for input(s): "HGBA1C" in the last 72 hours. CBG: Recent Labs  Lab 01/29/22 0008 01/29/22 0414 01/29/22 0822 01/29/22 1150 01/29/22 1638  GLUCAP 128* 124* 132* 121* 112*    Sepsis Labs: Recent Labs  Lab 02/02/2022 0630 02/11/2022 0643 01/17/2022 0713 01/28/22 0508  PROCALCITON 6.49  --   --   --   LATICACIDVEN  --  3.2* 3.5* 2.9*     Recent Results (from the past 240 hour(s))  Blood Culture (routine x 2)     Status: None (Preliminary result)   Collection Time: 01/29/2022  5:41 AM   Specimen: BLOOD RIGHT ARM  Result Value Ref Range Status   Specimen Description BLOOD RIGHT ARM  Final   Special Requests   Final    BOTTLES DRAWN AEROBIC AND ANAEROBIC Blood Culture adequate volume   Culture   Final    NO GROWTH 2 DAYS Performed at [facility], [place] [address] [place], [place] [address]    Report Status PENDING  Incomplete  Urine Culture     Status: Abnormal   Collection Time: 01/31/2022  5:41 AM   Specimen: In/Out Cath Urine  Result Value Ref Range Status   Specimen Description IN/OUT CATH URINE  Final   Special Requests NONE  Final   Culture (A)  Final    >=100,000 COLONIES/mL AEROCOCCUS SPECIES Standardized susceptibility testing for this organism is not available. Performed at [facility], [place] [address], [place], [place] [address]    Report Status 01/28/2022 FINAL  Final  Resp panel by RT-PCR (RSV, Flu A&B, Covid) Anterior Nasal Swab     Status: Abnormal   Collection Time: 02/05/2022  8:07 AM   Specimen: Anterior Nasal Swab  Result Value Ref Range Status   SARS Coronavirus 2 by RT PCR NEGATIVE NEGATIVE Final    Comment: (NOTE) SARS-CoV-2 target nucleic acids are NOT DETECTED.  The SARS-CoV-2 RNA is generally detectable in upper respiratory specimens during the acute  phase of infection. The lowest concentration of SARS-CoV-2 viral copies this assay can detect is 138 copies/mL. A negative result does not preclude SARS-Cov-2 infection and should not be used as the sole basis for treatment or other patient management decisions. A negative result may occur with  improper specimen collection/handling, submission of specimen other than nasopharyngeal swab, presence of viral mutation(s) within the areas targeted by this assay, and inadequate number of viral copies(<138 copies/mL). A negative result must be combined with clinical observations, patient history, and epidemiological information. The expected result is Negative.  Fact Sheet for Patients:  EntrepreneurPulse.com.au  Fact Sheet for Healthcare Providers:  IncredibleEmployment.be  This test is no t yet approved or cleared by the [place] and  has been authorized for detection and/or diagnosis of SARS-CoV-2 by FDA under an Emergency Use Authorization (EUA). This EUA will remain  in effect (meaning this test can be used) for the duration of the COVID-19 declaration under Section 564(b)(1) of the Act, 21 U.S.C.section 360bbb-3(b)(1), unless the authorization is terminated  or revoked sooner.       Influenza A by PCR NEGATIVE NEGATIVE Final   Influenza B by PCR NEGATIVE NEGATIVE Final    Comment: (NOTE) The Xpert Xpress SARS-CoV-2/FLU/RSV plus assay is intended as an aid in the diagnosis of influenza from Nasopharyngeal swab specimens and should not be used as a sole basis for treatment. Nasal washings and aspirates are unacceptable for Xpert Xpress SARS-CoV-2/FLU/RSV testing.  Fact Sheet for Patients: EntrepreneurPulse.com.au  Fact Sheet for Healthcare Providers: IncredibleEmployment.be  This test is not yet approved or cleared by the [place] FDA and has been authorized for detection and/or diagnosis of  SARS-CoV-2 by FDA under an Emergency Use Authorization (EUA). This EUA will remain in effect (meaning this test can be used) for the duration of the COVID-19 declaration under Section 564(b)(1) of the Act, 21 U.S.C. section 360bbb-3(b)(1), unless the authorization is terminated or revoked.     Resp Syncytial Virus by PCR POSITIVE (A) NEGATIVE Final    Comment: (NOTE) Fact Sheet for Patients: EntrepreneurPulse.com.au  Fact Sheet for Healthcare Providers: IncredibleEmployment.be  This test is not yet approved or cleared by the [place] FDA and has been authorized for detection and/or diagnosis of SARS-CoV-2 by FDA under an Emergency Use Authorization (EUA). This EUA will remain in effect (meaning this test can be used) for the duration of the COVID-19 declaration under Section 564(b)(1) of the Act, 21 U.S.C. section 360bbb-3(b)(1), unless the authorization is terminated or revoked.  Performed at [facility], [place] [address] [place], [place] [address]     Radiology Studies: CT HEAD WO CONTRAST (5MM)  Result Date: 01/29/2022 CLINICAL DATA:  Altered mental status, found on floor next to bed EXAM: CT HEAD WITHOUT CONTRAST TECHNIQUE: Contiguous axial images were obtained from the base of the skull through the vertex without intravenous contrast. RADIATION DOSE REDUCTION: This exam was performed according to the departmental dose-optimization program which includes automated exposure control, adjustment of the mA and/or kV according to patient size and/or use of iterative reconstruction technique. COMPARISON:  01/10/2022 FINDINGS: Brain: High-density subdural hematoma along the left cerebral convexity spanning the frontal and parietal lobes and measuring up to 5 mm in thickness. No associated midline shift. 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 No evidence of acute infarct. No  hydrocephalus or mass. Vascular: No hyperdense vessel or unexpected calcification. Skull: Normal. Negative for fracture or focal lesion. Sinuses/Orbits: No evidence of injury See Z vision dashboard concerning attempts for direct communication. Critical Value/emergent results were called by telephone at the time of interpretation on 01/29/2022 at 5:55 Am to provider Jennette Kettle , who verbally acknowledged these results. IMPRESSION: Acute subdural hematoma on the left measuring up to 5 mm in thickness. Electronically Signed   By: Jorje Guild M.D.   On: 01/29/2022 06:02    Scheduled Meds:  insulin aspart  0-6 Units Subcutaneous Q6H   ketotifen  1 drop Both Eyes Daily   lactulose  300 mL Rectal TID   latanoprost  1 drop Both Eyes QHS  lidocaine  1 patch Transdermal Daily   pantoprazole (PROTONIX) IV  40 mg Intravenous Q12H   sodium chloride flush  3 mL Intravenous Q12H   Continuous Infusions:  sodium chloride 75 mL/hr at 01/29/22 0519   cefTRIAXone (ROCEPHIN)  IV Stopped (01/29/22 1054)   famotidine (PEPCID) IV Stopped (01/28/22 2249)     LOS: 2 days   Time spent: 85mn  Nura Cahoon C Aleta Manternach, DO [facility]  If 7PM-7AM, please contact night-coverage [email or web address]  01/29/2022, 4:48 PM

## 2022-01-29 NOTE — Significant Event (Addendum)
TRH night coverage note:  Called by RN who found pt sitting on floor, 1 restraint attached.  Didn't seem injured or to have hit head that RN could tell.  Ill add on a CT head since she's severely hypertensive and encephalopathic (though both of these facts look to be unchanged from earlier in based on day hospitalist's and GI's notes).  Ammonia level should be pending for this AM along with AM labs which are being drawn now.  Update: Called by Radiology: pt has 24m SDH on left.  Ammonia only 17 this AM...  Calling [facility] Stat.  Spoke with Meyran from NS: they will look at scan but initial impression = rescan in 24h with a 58m  Wouldn't recommend surgery for a 49m349m And TBH, does NOT sound like family / patient would want open brain surgery (they didn't even want an EGD for GIB and were talking about possible hospice).

## 2022-01-30 ENCOUNTER — Inpatient Hospital Stay (HOSPITAL_COMMUNITY): Payer: Medicaid Other

## 2022-01-30 DIAGNOSIS — R4182 Altered mental status, unspecified: Secondary | ICD-10-CM

## 2022-01-30 DIAGNOSIS — N179 Acute kidney failure, unspecified: Secondary | ICD-10-CM | POA: Diagnosis not present

## 2022-01-30 DIAGNOSIS — Z7189 Other specified counseling: Secondary | ICD-10-CM

## 2022-01-30 DIAGNOSIS — E872 Acidosis, unspecified: Secondary | ICD-10-CM | POA: Diagnosis not present

## 2022-01-30 DIAGNOSIS — K7682 Hepatic encephalopathy: Secondary | ICD-10-CM | POA: Diagnosis not present

## 2022-01-30 LAB — GLUCOSE, CAPILLARY
Glucose-Capillary: 129 mg/dL — ABNORMAL HIGH (ref 70–99)
Glucose-Capillary: 115 mg/dL — ABNORMAL HIGH (ref 70–99)
Glucose-Capillary: 135 mg/dL — ABNORMAL HIGH (ref 70–99)
Glucose-Capillary: 96 mg/dL (ref 70–99)
Glucose-Capillary: 97 mg/dL (ref 70–99)
Glucose-Capillary: 103 mg/dL — ABNORMAL HIGH (ref 70–99)

## 2022-01-30 LAB — CBC
HCT: 25.7 % — ABNORMAL LOW (ref 36.0–46.0)
Hemoglobin: 7.8 g/dL — ABNORMAL LOW (ref 12.0–15.0)
MCH: 26.7 pg (ref 26.0–34.0)
MCHC: 30.4 g/dL (ref 30.0–36.0)
MCV: 88 fL (ref 80.0–100.0)
Platelets: 59 10*3/uL — ABNORMAL LOW (ref 150–400)
RBC: 2.92 MIL/uL — ABNORMAL LOW (ref 3.87–5.11)
RDW: 21.8 % — ABNORMAL HIGH (ref 11.5–15.5)
WBC: 8.5 10*3/uL (ref 4.0–10.5)
nRBC: 0.4 % — ABNORMAL HIGH (ref 0.0–0.2)

## 2022-01-30 LAB — COMPREHENSIVE METABOLIC PANEL
ALT: 31 U/L (ref 0–44)
AST: 51 U/L — ABNORMAL HIGH (ref 15–41)
Albumin: 2.1 g/dL — ABNORMAL LOW (ref 3.5–5.0)
Alkaline Phosphatase: 101 U/L (ref 38–126)
Anion gap: 12 (ref 5–15)
BUN: 68 mg/dL — ABNORMAL HIGH (ref 6–20)
CO2: 17 mmol/L — ABNORMAL LOW (ref 22–32)
Calcium: 8.8 mg/dL — ABNORMAL LOW (ref 8.9–10.3)
Chloride: 126 mmol/L — ABNORMAL HIGH (ref 98–111)
Creatinine, Ser: 4.3 mg/dL — ABNORMAL HIGH (ref 0.44–1.00)
GFR, Estimated: 11 mL/min — ABNORMAL LOW (ref >60)
Glucose, Bld: 129 mg/dL — ABNORMAL HIGH (ref 70–99)
Potassium: 3.5 mmol/L (ref 3.5–5.1)
Sodium: 155 mmol/L — ABNORMAL HIGH (ref 135–145)
Total Bilirubin: 4.1 mg/dL — ABNORMAL HIGH (ref 0.3–1.2)
Total Protein: 6.4 g/dL — ABNORMAL LOW (ref 6.5–8.1)

## 2022-01-30 LAB — AMMONIA: Ammonia: 25 umol/L (ref 9–35)

## 2022-01-30 LAB — BASIC METABOLIC PANEL
Anion gap: 12 (ref 5–15)
BUN: 67 mg/dL — ABNORMAL HIGH (ref 6–20)
CO2: 16 mmol/L — ABNORMAL LOW (ref 22–32)
Calcium: 8.8 mg/dL — ABNORMAL LOW (ref 8.9–10.3)
Chloride: 128 mmol/L — ABNORMAL HIGH (ref 98–111)
Creatinine, Ser: 4.17 mg/dL — ABNORMAL HIGH (ref 0.44–1.00)
GFR, Estimated: 12 mL/min — ABNORMAL LOW (ref >60)
Glucose, Bld: 94 mg/dL (ref 70–99)
Potassium: 3.6 mmol/L (ref 3.5–5.1)
Sodium: 156 mmol/L — ABNORMAL HIGH (ref 135–145)

## 2022-01-30 LAB — UREA NITROGEN, URINE: Urea Nitrogen, Ur: 480 mg/dL

## 2022-01-30 MED ORDER — POLYETHYLENE GLYCOL 3350 17 GM/SCOOP PO POWD
0.5000 | Freq: Once | ORAL | Status: DC
  Filled 2022-01-30: qty 255

## 2022-01-30 MED ORDER — ZINC SULFATE 220 (50 ZN) MG PO CAPS
220.0000 mg | ORAL_CAPSULE | Freq: Every day | ORAL | Status: DC
  Administered 2022-01-30: 220 mg via ORAL
  Filled 2022-01-30 (×2): qty 1

## 2022-01-30 MED ORDER — HYDROMORPHONE HCL 1 MG/ML IJ SOLN
0.5000 mg | INTRAMUSCULAR | Status: DC | PRN
  Administered 2022-01-30: 0.5 mg via INTRAVENOUS
  Filled 2022-01-30: qty 0.5

## 2022-01-30 MED ORDER — POLYETHYLENE GLYCOL 3350 17 G PO PACK: 17.0000 g | PACK | Freq: Every day | ORAL | Status: DC

## 2022-01-30 MED ORDER — DEXTROSE 5 % IV SOLN: INTRAVENOUS | Status: AC

## 2022-01-30 MED ORDER — POLYETHYLENE GLYCOL 3350 17 GM/SCOOP PO POWD
1.0000 | Freq: Once | ORAL | Status: DC
  Filled 2022-01-30: qty 255

## 2022-01-30 MED ORDER — ALBUMIN HUMAN 25 % IV SOLN
25.0000 g | Freq: Four times a day (QID) | INTRAVENOUS | Status: DC
  Administered 2022-01-30 – 2022-01-31 (×5): 25 g via INTRAVENOUS
  Filled 2022-01-30 (×5): qty 100

## 2022-01-30 MED ORDER — THIAMINE HCL 100 MG/ML IJ SOLN
500.0000 mg | Freq: Three times a day (TID) | INTRAVENOUS | Status: DC
  Administered 2022-01-31 (×2): 500 mg via INTRAVENOUS
  Filled 2022-01-30 (×5): qty 5

## 2022-01-30 MED ORDER — DEXTROSE 5 % IV SOLN: INTRAVENOUS | Status: DC

## 2022-01-30 MED ORDER — DEXTROSE IN LACTATED RINGERS 5 % IV SOLN: INTRAVENOUS | Status: AC

## 2022-01-30 MED ORDER — RIFAXIMIN 550 MG PO TABS
550.0000 mg | ORAL_TABLET | Freq: Two times a day (BID) | ORAL | Status: DC
  Administered 2022-01-30 – 2022-01-31 (×2): 550 mg via ORAL
  Filled 2022-01-30 (×3): qty 1

## 2022-01-30 NOTE — Progress Notes (Signed)
EEG complete - results pending 

## 2022-01-30 NOTE — Consult Note (Signed)
Consultation Note Date: 01/30/2022   Patient Name: Judy Meza  DOB: 10-08-1960  MRN: [id number]  Age / Sex: 62 y.o., female  PCP: Caprice Renshaw, MD Referring Physician: George Hugh, MD  Reason for Consultation: Establishing goals of care  HPI/Patient Profile: 62 y.o. female  with past medical history of PMH significant for prior alcohol abuse, hepatitis C, liver cirrhosis, diastolic CHF, DM2, HTN, COPD, anxiety/depression, dementia, laryngeal CA s/p radiation and tracheostomy, complicated by tracheocutaneous fistula, CKD stage 3, NPH, prior IV drug use, and OSA  admitted on 01/17/2022 with altered mental status.   Patient is admitted with acute hepatic encephalopathy, presenting ammonia level was 70.  Also with SIRS, lactic acidosis, acute renal failure, possible GI bleed.  Unfortunately she had a fall overnight while admitted and imaging showed acute traumatic 5 mm subdural hematoma. PMT has been consulted to assist with goals of care conversation.  Clinical Assessment and Goals of Care:  I have reviewed medical records including EPIC notes, labs and imaging, assessed the patient and then met at the bedside with patient's sister Shirlean Mylar in person and by phone brother Chrissie Noa and sisterS Opal Sidles, and Barnetta Chapel to discuss diagnosis prognosis, GOC, EOL wishes, disposition and options.  I introduced Palliative Medicine as specialized medical care for people living with serious illness. It focuses on providing relief from the symptoms and stress of a serious illness. The goal is to improve quality of life for both the patient and the family.  We discussed a brief life review of the patient and then focused on their current illness.  The natural disease trajectory and expectations at EOL were discussed.  I attempted to elicit values and goals of care important to the patient.    Medical History Review and  Understanding:  Patient's family understand the severity of her illness and that she is likely approaching end-of-life either with or without ongoing medical interventions.  Functional and Nutritional State: Patient has been declining for several months.  Albumin of 2.1 noted on 1/19.  Palliative Symptoms: Pain, severe  Advance Directives: A detailed discussion regarding advanced directives was had.  Patient has a MOST form on file indicating desire for comfort measures.  Code Status: Concepts specific to code status, artifical feeding and hydration, and rehospitalization were considered and discussed.   Discussion: Reviewed the difference between hospice and palliative care at family's request.  They do not feel like outpatient palliative care support at SNF would be sufficient at this point in patient's illness trajectory.  They also have concerns about the facility's ability to provide care, even if hospice support or to come in intermittently.  They all understand she is likely to continue declining.  Her brother Chrissie Noa had to make similar decisions with his uncle when he was enrolled in hospice and passed away a few weeks later.  They can see the similarities between these loved ones.  Shirlean Mylar tells me that patient is tearful and admitted to being in pain, which she usually never does.  The risks and benefits of administering as needed opioids were reviewed in detail.  They feel like this is what patient would want even if care is not completely focused on comfort quite yet.  Discussed with RN and appreciate her assistance with providing this.  Hospice facilities were reviewed with family.  They would prefer the [facility] facility, for which she is likely eligible for.  They would like to meet again tomorrow after the CT results have come back to discuss transitioning  care to comfort care and hospice.   The difference between aggressive medical intervention and comfort care was considered in  light of the patient's goals of care. Hospice and Palliative Care services outpatient were explained and offered.   Discussed the importance of continued conversation with family and the medical providers regarding overall plan of care and treatment options, ensuring decisions are within the context of the patient's values and GOCs.   Questions and concerns were addressed.  Hard Choices booklet left for review. The family was encouraged to call with questions or concerns.  PMT will continue to support holistically.   SUMMARY OF RECOMMENDATIONS   -Continue DNR -Continue current care -Family wish to see the repeat CT scan results and further lab results before making final decision to transition to comfort care and hospice -Meet again at patient's bedside tomorrow morning at 10:30 AM -Psychosocial and emotional support provided -PMT will continue to follow and support  Prognosis:  < 2 weeks  Discharge Planning: To Be Determined      Primary Diagnoses: Present on Admission:  Hepatic encephalopathy ([place])  AKI (acute kidney injury) ([place])  SIRS (systemic inflammatory response syndrome) (H[facility])  Lactic acidosis  Normocytic anemia  GI bleed  Chronic diastolic CHF (congestive heart failure) ([place])   Physical Exam Vitals and nursing note reviewed.  Constitutional:      Appearance: She is ill-appearing.     Interventions: Nasal cannula in place.     Comments: ngt  Cardiovascular:     Rate and Rhythm: Normal rate.  Pulmonary:     Effort: Pulmonary effort is normal.  Neurological:     Mental Status: She is lethargic.    Vital Signs: BP (!) 167/97 (BP Location: Right Arm)   Pulse 85   Temp (!) 97.5 F (36.4 C) (Axillary)   Resp 20   Wt 59.5 kg   SpO2 93%   BMI 23.99 kg/m  Pain Scale: 0-10   Pain Score: 0-No pain   SpO2: SpO2: 93 % O2 Device:SpO2: 93 % O2 Flow Rate: .O2 Flow Rate (L/min): 2 L/min   Palliative Assessment/Data: 10%    MDM: High   Florice Hindle Johnnette Litter, PA-C  Palliative Medicine Team Team phone # [phone]  Thank you for allowing the Palliative Medicine Team to assist in the care of this patient. Please utilize secure chat with additional questions, if there is no response within 30 minutes please call the above phone number.  Palliative Medicine Team providers are available by phone from 7am to 7pm daily and can be reached through the team cell phone.  Should this patient require assistance outside of these hours, please call the patient's attending physician.

## 2022-01-30 NOTE — Progress Notes (Addendum)
PROGRESS NOTE    Judy Meza  EYC:[id number] DOB: 05-11-1960 DOA: 01/15/2022 PCP: Caprice Renshaw, MD  Brief Narrative:  Judy Meza is a 62 y.o. 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     01/11/22  01/26/2022  01/28/22  01/29/22  01/30/22   Creatinine 1.33 (H) 5.78 (H) 4.65 (H) 4.15 (H) 4.30 (H)    1/18: Head CT shows <5 mm subdural hematoma.  Neurosurgery recommends no intervention.  Assessment & Plan:   Principal Problem:   Hepatic encephalopathy (HCC) Active Problems:   Lactic acidosis   SIRS (systemic inflammatory response syndrome) (HCC)   Normocytic anemia   GI bleed   AKI (acute kidney injury) ([place])   Chronic diastolic CHF (congestive heart failure) (HCC)   Heme positive stool  Acute Hepatic Encephalopathy: Judy Meza remains in a significantly somnolent and semi-stuperous state.  She responds to a sternal rub and occasionally to verbal stimuli. - Add polythylene glycol 0.5 container x once if able to tolerate - continue Lactulose enemas TID.  Judy Meza has had 4 bowel movements today.  The goal is 3-4 bowel movements per day. - Place NGT and start Rifaximin 550 mg BID. - Start IV Thiamine 500 mg q8hrs and add on Zinc Sulfate 220 mg daily. - Check EEG to rule out seizures. - Avoid sedating medications. - If patient does not respond to hepatic encephalopathy therapies, we will check MRI of the brain. - Overall patient has a poor prognosis.  This was discussed with family at bedside and over the phone.  Hospice care team has been consulted and the plan is to  discharge home with hospice.  <69m Subdural Hematoma: - Repeat Head CT. - Perform neuro checks q4h. - Touch base with Neurosurgery, appreciate.  Acute Hypernatremia: This may be a side effect of lactulose therapy.  This needs to be managed aggressively otherwise it will worsen encephalopathy and agitation. - Start fluids with D5 and monitor sodium levels.  Partial SBO: - Place NGT and set to low intermittent suction. - Give gentle fluids. - Repeat Abdominal Xray in am.  AKI on CKD3: This is concerning for hepatorenal syndrome, however 1/17 urine sodium was now low. - Creatinine is 4.3 mg/dL with baseline ~ 1.3 mg/dL. - Go ahead and start Albumin 25 mg q6hrs.  Hold all diuretics.  Monitor renal function. - SBP is 150-160 mmHg.  Hold off on Midodrine + Octreotide cocktail.  Acute Blood Loss Anemia:  - GI was consulted and family refused EGD.   - Monitor HH. - Continue IV Protonix 40 mg BID.  Aerococcus UTI: This is a fastidious gram positive cocci that is typically sensitive to penicillins.   - Continue IV Ceftriaxone for now as this also covers GI bacterial translocation in the setting of variceal bleeds.  This is Day 4 out of 7 of antibiotics.  Grade 2 Diastolic Heart Failure: - Euvolemic.  Diabetes Mellitus Type 2: - Lispro SSI with POC glucose ACHS.   DVT prophylaxis: On hold. Code Status: DNI/DNR Family Communication: Updated at bedside and over the phone  Status is: inpatient  Dispo: The patient is from: SNF              Anticipated d/c is to: To be determined              Anticipated d/c date is: To be determined              Patient currently not medically stable for discharge  Consultants:  GI  Procedures:  None  Antimicrobials:  Ceftriaxone  Subjective: Judy Meza remains somnolent and in a semi-stupurous state. She has no nystagmus or eye deviations. She responds to sternal and occasionally to verbal stimuli. Plan of care was discussed with family  at bedside and over the phone.  Objective: Vitals:   01/30/22 0748 01/30/22 0800 01/30/22 1136 01/30/22 1200  BP:  (!) 158/95  (!) 167/97  Pulse: 84 85 84 85  Resp: '18 20 18 20  '$ Temp:  97.6 F (36.4 C)  (!) 97.5 F (36.4 C)  TempSrc:  Oral  Axillary  SpO2:  95%  93%  Weight:       No intake or output data in the 24 hours ending 01/30/22 1611  Filed Weights   01/30/22 0500  Weight: 59.5 kg    Examination:  Physical Exam Constitutional:      Appearance: She is ill-appearing.     Comments: Somnolent and lethargic  HENT:     Head: Normocephalic and atraumatic.     Mouth/Throat:     Mouth: Mucous membranes are dry.  Eyes:     Extraocular Movements: Extraocular movements intact.     Pupils: Pupils are equal, round, and reactive to light.  Cardiovascular:     Rate and Rhythm: Normal rate and regular rhythm.     Pulses: Normal pulses.     Heart sounds: Normal heart sounds.  Pulmonary:     Effort: Pulmonary effort is normal.     Breath sounds: Normal breath sounds.  Abdominal:     Palpations: Abdomen is soft.     Tenderness: There is no abdominal tenderness.  Musculoskeletal:     Cervical back: Neck supple. No rigidity.  Skin:    General: Skin is warm and dry.     Capillary Refill: Capillary refill takes less than 2 seconds.  Neurological:     Comments: Poor neurological status      Data Reviewed: I have personally reviewed following labs and imaging studies  CBC: Recent Labs  Lab 01/17/2022 0630 01/21/2022 2205 01/28/22 0508 01/29/22 0513 01/30/22 0553  WBC 17.6*  --  12.5* 12.9* 8.5  NEUTROABS 14.3*  --   --   --   --   HGB 6.6* 8.2* 8.3* 8.3* 7.8*  HCT 21.9* 26.0* 27.3* 27.9* 25.7*  MCV 87.3  --  87.8 88.9 88.0  PLT 98*  --  81* 101* 59*    Basic Metabolic Panel: Recent Labs  Lab 02/09/2022 0630 01/28/22 0508 01/29/22 0513 01/30/22 0553 01/30/22 1414  NA 145 146* 151* 155* 156*  K 4.9 4.2 4.7 3.5 3.6  CL 113* 114* 120* 126* 128*  CO2 22 20* 18*  17* 16*  GLUCOSE 141* 132* 152* 129* 94  BUN 63* 60* 62* 68* 67*  CREATININE 5.78* 4.65* 4.15* 4.30* 4.17*  CALCIUM 8.9 8.9 8.7* 8.8* 8.8*    GFR: Estimated Creatinine Clearance: 11.5 mL/min (A) (by C-G formula based on SCr of 4.17 mg/dL (H)). Liver Function Tests: Recent Labs  Lab 02/02/2022 0630 01/28/22 0508 01/29/22 0513 01/30/22 0553  AST 63*  57* 70* 51*  ALT 35 34 32 31  ALKPHOS 98 89 101 101  BILITOT 2.4* 2.6* 2.9* 4.1*  PROT 7.1 7.0 6.5 6.4*  ALBUMIN 2.3* 2.4* 2.3* 2.1*    No results for input(s): "LIPASE", "AMYLASE" in the last 168 hours. Recent Labs  Lab 01/29/2022 0728 01/29/22 0513 01/30/22 0553  AMMONIA 70* 27 25    Coagulation Profile: Recent Labs  Lab 02/07/2022 0630  INR 1.6*    Cardiac Enzymes: No results for input(s): "CKTOTAL", "CKMB", "CKMBINDEX", "TROPONINI" in the last 168 hours. BNP (last 3 results) No results for input(s): "PROBNP" in the last 8760 hours. HbA1C: No results for input(s): "HGBA1C" in the last 72 hours. CBG: Recent Labs  Lab 01/29/22 1638 01/29/22 2346 01/30/22 0621 01/30/22 0814 01/30/22 1233  GLUCAP 112* 135* 115* 129* 96    Sepsis Labs: Recent Labs  Lab 01/17/2022 0630 01/23/2022 0643 01/23/2022 0713 01/28/22 0508  PROCALCITON 6.49  --   --   --   LATICACIDVEN  --  3.2* 3.5* 2.9*     Recent Results (from the past 240 hour(s))  Blood Culture (routine x 2)     Status: None (Preliminary result)   Collection Time: 01/19/2022  5:41 AM   Specimen: BLOOD RIGHT ARM  Result Value Ref Range Status   Specimen Description BLOOD RIGHT ARM  Final   Special Requests   Final    BOTTLES DRAWN AEROBIC AND ANAEROBIC Blood Culture adequate volume   Culture   Final    NO GROWTH 3 DAYS Performed at [facility], [place] [address] [place], [place] [address]    Report Status PENDING  Incomplete  Urine Culture     Status: Abnormal   Collection Time: 01/12/2022  5:41 AM   Specimen: In/Out Cath Urine  Result Value Ref Range Status    Specimen Description IN/OUT CATH URINE  Final   Special Requests NONE  Final   Culture (A)  Final    >=100,000 COLONIES/mL AEROCOCCUS SPECIES Standardized susceptibility testing for this organism is not available. Performed at [facility], [place] [address] [place], [place] [address]    Report Status 01/28/2022 FINAL  Final  Resp panel by RT-PCR (RSV, Flu A&B, Covid) Anterior Nasal Swab     Status: Abnormal   Collection Time: 02/07/2022  8:07 AM   Specimen: Anterior Nasal Swab  Result Value Ref Range Status   SARS Coronavirus 2 by RT PCR NEGATIVE NEGATIVE Final    Comment: (NOTE) SARS-CoV-2 target nucleic acids are NOT DETECTED.  The SARS-CoV-2 RNA is generally detectable in upper respiratory specimens during the acute phase of infection. The lowest concentration of SARS-CoV-2 viral copies this assay can detect is 138 copies/mL. A negative result does not preclude SARS-Cov-2 infection and should not be used as the sole basis for treatment or other patient management decisions. A negative result may occur with  improper specimen collection/handling, submission of specimen other than nasopharyngeal swab, presence of viral mutation(s) within the areas targeted by this assay, and inadequate number of viral copies(<138 copies/mL). A negative result must be combined with clinical observations, patient history, and epidemiological information. The expected result is Negative.  Fact Sheet for Patients:  EntrepreneurPulse.com.au  Fact Sheet for Healthcare Providers:  IncredibleEmployment.be  This test is no t yet approved or cleared by the [place] FDA and  has been authorized for detection and/or diagnosis of SARS-CoV-2 by FDA under an Emergency Use Authorization (EUA). This EUA will remain  in  effect (meaning this test can be used) for the duration of the COVID-19 declaration under Section 564(b)(1) of the Act, 21 U.S.C.section  360bbb-3(b)(1), unless the authorization is terminated  or revoked sooner.       Influenza A by PCR NEGATIVE NEGATIVE Final   Influenza B by PCR NEGATIVE NEGATIVE Final    Comment: (NOTE) The Xpert Xpress SARS-CoV-2/FLU/RSV plus assay is intended as an aid in the diagnosis of influenza from Nasopharyngeal swab specimens and should not be used as a sole basis for treatment. Nasal washings and aspirates are unacceptable for Xpert Xpress SARS-CoV-2/FLU/RSV testing.  Fact Sheet for Patients: EntrepreneurPulse.com.au  Fact Sheet for Healthcare Providers: IncredibleEmployment.be  This test is not yet approved or cleared by the [place] FDA and has been authorized for detection and/or diagnosis of SARS-CoV-2 by FDA under an Emergency Use Authorization (EUA). This EUA will remain in effect (meaning this test can be used) for the duration of the COVID-19 declaration under Section 564(b)(1) of the Act, 21 U.S.C. section 360bbb-3(b)(1), unless the authorization is terminated or revoked.     Resp Syncytial Virus by PCR POSITIVE (A) NEGATIVE Final    Comment: (NOTE) Fact Sheet for Patients: EntrepreneurPulse.com.au  Fact Sheet for Healthcare Providers: IncredibleEmployment.be  This test is not yet approved or cleared by the [place] FDA and has been authorized for detection and/or diagnosis of SARS-CoV-2 by FDA under an Emergency Use Authorization (EUA). This EUA will remain in effect (meaning this test can be used) for the duration of the COVID-19 declaration under Section 564(b)(1) of the Act, 21 U.S.C. section 360bbb-3(b)(1), unless the authorization is terminated or revoked.  Performed at [facility], [place] [address], [place], [place] [address]     Radiology Studies: DG Abd 1 View  Result Date: 01/30/2022 CLINICAL DATA:  Nausea and vomiting, NG tube. EXAM: ABDOMEN - 1 VIEW COMPARISON:   Chest x-ray 01/29/2022 FINDINGS: Enteric tube tip is at the level of the body of the stomach. There are dilated air-filled small bowel loops in the mid abdomen and left upper quadrant measuring up to 3.5 cm. Air seen throughout nondilated colon to the level the rectum. There are vascular calcifications in the soft tissues. No acute fractures are seen. There are some patchy airspace opacities in the right lung base, new from prior. IMPRESSION: 1. Enteric tube tip is at the level of the body of the stomach. 2. Dilated air-filled small bowel loops in the mid abdomen and left upper quadrant. Findings may represent ileus or partial small bowel obstruction. Electronically Signed   By: Ronney Asters M.D.   On: 01/30/2022 15:37   EEG adult  Result Date: 01/30/2022 Lora Havens, MD     01/30/2022  1:26 PM Patient Name: ALAYJA ARMAS MRN: [id number] Epilepsy Attending: Lora Havens Referring Physician/Provider: George Hugh, MD Date: 01/30/2022 Duration: 22.55 mins Patient history: 62yo F with ams. EEG to evaluate for seizure Level of alertness: lethargic AEDs during EEG study: None Technical aspects: This EEG study was done with scalp electrodes positioned according to the 10-20 International system of electrode placement. Electrical activity was reviewed with band pass filter of 1-'70Hz'$ , sensitivity of 7 uV/mm, display speed of 69m/sec with a '60Hz'$  notched filter applied as appropriate. EEG data were recorded continuously and digitally stored.  Video monitoring was available and reviewed as appropriate. Description: EEG showed continuous generalized low amplitude 3 to 5 Hz theta-delta slowing. Hyperventilation and photic stimulation were not performed.   Of note  study was technically difficult due to significant movement and electrode artifact. ABNORMALITY - Continuous slow, generalized IMPRESSION: This technically difficult study is suggestive of severe diffuse encephalopathy, nonspecific etiology. No seizures  or epileptiform discharges were seen throughout the recording. Priyanka Barbra Sarks   CT HEAD WO CONTRAST (5MM)  Result Date: 01/29/2022 CLINICAL DATA:  Altered mental status, found on floor next to bed EXAM: CT HEAD WITHOUT CONTRAST TECHNIQUE: Contiguous axial images were obtained from the base of the skull through the vertex without intravenous contrast. RADIATION DOSE REDUCTION: This exam was performed according to the departmental dose-optimization program which includes automated exposure control, adjustment of the mA and/or kV according to patient size and/or use of iterative reconstruction technique. COMPARISON:  01/10/2022 FINDINGS: Brain: High-density subdural hematoma along the left cerebral convexity spanning the frontal and parietal lobes and measuring up to 5 mm in thickness. No associated midline shift. 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 No evidence of acute infarct. No hydrocephalus or mass. Vascular: No hyperdense vessel or unexpected calcification. Skull: Normal. Negative for fracture or focal lesion. Sinuses/Orbits: No evidence of injury See Z vision dashboard concerning attempts for direct communication. Critical Value/emergent results were called by telephone at the time of interpretation on 01/29/2022 at 5:55 Am to provider Jennette Kettle , who verbally acknowledged these results. IMPRESSION: Acute subdural hematoma on the left measuring up to 5 mm in thickness. Electronically Signed   By: Jorje Guild M.D.   On: 01/29/2022 06:02    Scheduled Meds:  insulin aspart  0-6 Units Subcutaneous Q6H   ketotifen  1 drop Both Eyes Daily   lactulose  300 mL Rectal TID   latanoprost  1 drop Both Eyes QHS   lidocaine  1 patch Transdermal Daily   pantoprazole (PROTONIX) IV  40 mg Intravenous Q12H   rifaximin  550 mg Oral BID   sodium chloride flush  3 mL Intravenous Q12H   Continuous Infusions:  albumin human 25 g  (01/30/22 1131)   cefTRIAXone (ROCEPHIN)  IV 2 g (01/30/22 1002)   dextrose     famotidine (PEPCID) IV 20 mg (01/29/22 2305)   thiamine (VITAMIN B1) injection       LOS: 3 days   Time spent: 67mn  HGeorge Hugh MD [facility]  If 7PM-7AM, please contact night-coverage [email or web address]  01/30/2022, 4:11 PM

## 2022-01-30 NOTE — Progress Notes (Signed)
  Transition of Care [facility]) Screening Note   Patient Details  Name: LINCOLN KLEINER Date of Birth: May 11, 1960   Transition of [facility]) CM/SW Contact:    Benard Halsted, LCSW Phone Number: 01/30/2022, 2:40 PM    Transition of Care Department [facility]) has reviewed patient from long term care at [facility]. We will continue to monitor patient advancement through interdisciplinary progression rounds. If new patient transition needs arise, please place a TOC consult.

## 2022-01-30 NOTE — Procedures (Signed)
Patient Name: Judy Meza  MRN: [id number]  Epilepsy Attending: Lora Havens  Referring Physician/Provider: George Hugh, MD  Date: 01/30/2022 Duration: 22.55 mins  Patient history: 62yo F with ams. EEG to evaluate for seizure  Level of alertness: lethargic  AEDs during EEG study: None  Technical aspects: This EEG study was done with scalp electrodes positioned according to the 10-20 International system of electrode placement. Electrical activity was reviewed with band pass filter of 1-'70Hz'$ , sensitivity of 7 uV/mm, display speed of 83m/sec with a '60Hz'$  notched filter applied as appropriate. EEG data were recorded continuously and digitally stored.  Video monitoring was available and reviewed as appropriate.  Description: EEG showed continuous generalized low amplitude 3 to 5 Hz theta-delta slowing. Hyperventilation and photic stimulation were not performed.     Of note study was technically difficult due to significant movement and electrode artifact.   ABNORMALITY - Continuous slow, generalized  IMPRESSION: This technically difficult study is suggestive of severe diffuse encephalopathy, nonspecific etiology. No seizures or epileptiform discharges were seen throughout the recording.  Judy Meza Judy Meza

## 2022-01-31 ENCOUNTER — Inpatient Hospital Stay (HOSPITAL_COMMUNITY): Payer: Medicaid Other

## 2022-01-31 DIAGNOSIS — E860 Dehydration: Secondary | ICD-10-CM

## 2022-01-31 DIAGNOSIS — K7682 Hepatic encephalopathy: Secondary | ICD-10-CM | POA: Diagnosis not present

## 2022-01-31 DIAGNOSIS — R6510 Systemic inflammatory response syndrome (SIRS) of non-infectious origin without acute organ dysfunction: Secondary | ICD-10-CM | POA: Diagnosis not present

## 2022-01-31 DIAGNOSIS — Z7189 Other specified counseling: Secondary | ICD-10-CM | POA: Diagnosis not present

## 2022-01-31 LAB — GLUCOSE, CAPILLARY
Glucose-Capillary: 109 mg/dL — ABNORMAL HIGH (ref 70–99)
Glucose-Capillary: 122 mg/dL — ABNORMAL HIGH (ref 70–99)

## 2022-01-31 LAB — COMPREHENSIVE METABOLIC PANEL
ALT: 23 U/L (ref 0–44)
AST: 40 U/L (ref 15–41)
Albumin: 3.2 g/dL — ABNORMAL LOW (ref 3.5–5.0)
Alkaline Phosphatase: 75 U/L (ref 38–126)
Anion gap: 11 (ref 5–15)
BUN: 68 mg/dL — ABNORMAL HIGH (ref 6–20)
CO2: 17 mmol/L — ABNORMAL LOW (ref 22–32)
Calcium: 9.3 mg/dL (ref 8.9–10.3)
Chloride: 129 mmol/L — ABNORMAL HIGH (ref 98–111)
Creatinine, Ser: 4.27 mg/dL — ABNORMAL HIGH (ref 0.44–1.00)
GFR, Estimated: 11 mL/min — ABNORMAL LOW (ref >60)
Glucose, Bld: 141 mg/dL — ABNORMAL HIGH (ref 70–99)
Potassium: 3.3 mmol/L — ABNORMAL LOW (ref 3.5–5.1)
Sodium: 157 mmol/L — ABNORMAL HIGH (ref 135–145)
Total Bilirubin: 3.9 mg/dL — ABNORMAL HIGH (ref 0.3–1.2)
Total Protein: 6.6 g/dL (ref 6.5–8.1)

## 2022-01-31 LAB — CBC
HCT: 23.5 % — ABNORMAL LOW (ref 36.0–46.0)
Hemoglobin: 7 g/dL — ABNORMAL LOW (ref 12.0–15.0)
MCH: 26.9 pg (ref 26.0–34.0)
MCHC: 29.8 g/dL — ABNORMAL LOW (ref 30.0–36.0)
MCV: 90.4 fL (ref 80.0–100.0)
Platelets: 40 10*3/uL — ABNORMAL LOW (ref 150–400)
RBC: 2.6 MIL/uL — ABNORMAL LOW (ref 3.87–5.11)
RDW: 22.2 % — ABNORMAL HIGH (ref 11.5–15.5)
WBC: 5.5 10*3/uL (ref 4.0–10.5)
nRBC: 0.7 % — ABNORMAL HIGH (ref 0.0–0.2)

## 2022-01-31 LAB — AMMONIA: Ammonia: 31 umol/L (ref 9–35)

## 2022-01-31 MED ORDER — HYDROMORPHONE HCL 1 MG/ML IJ SOLN: 1.0000 mg | INTRAMUSCULAR | Status: DC

## 2022-01-31 MED ORDER — POLYVINYL ALCOHOL 1.4 % OP SOLN: 1.0000 [drp] | Freq: Four times a day (QID) | OPHTHALMIC | Status: DC | PRN

## 2022-01-31 MED ORDER — GLYCOPYRROLATE 0.2 MG/ML IJ SOLN
0.4000 mg | INTRAMUSCULAR | Status: DC
  Administered 2022-01-31 – 2022-02-01 (×6): 0.4 mg via INTRAVENOUS
  Filled 2022-01-31 (×6): qty 2

## 2022-01-31 MED ORDER — POTASSIUM CHLORIDE CRYS ER 20 MEQ PO TBCR
20.0000 meq | EXTENDED_RELEASE_TABLET | Freq: Once | ORAL | Status: DC
  Filled 2022-01-31: qty 1

## 2022-01-31 MED ORDER — DIPHENHYDRAMINE HCL 50 MG/ML IJ SOLN: 12.5000 mg | INTRAMUSCULAR | Status: DC | PRN

## 2022-01-31 MED ORDER — BIOTENE DRY MOUTH MT LIQD: 15.0000 mL | OROMUCOSAL | Status: DC | PRN

## 2022-01-31 MED ORDER — HYDROMORPHONE HCL 1 MG/ML IJ SOLN
1.0000 mg | INTRAMUSCULAR | Status: DC
  Administered 2022-01-31 – 2022-02-01 (×4): 1 mg via INTRAVENOUS
  Filled 2022-01-31 (×4): qty 1

## 2022-01-31 MED ORDER — LORAZEPAM 2 MG/ML IJ SOLN
1.0000 mg | INTRAMUSCULAR | Status: DC | PRN
  Administered 2022-02-01: 1 mg via INTRAVENOUS
  Filled 2022-01-31: qty 1

## 2022-01-31 MED ORDER — BISACODYL 10 MG RE SUPP: 10.0000 mg | Freq: Every day | RECTAL | Status: DC | PRN

## 2022-01-31 MED ORDER — HYDROMORPHONE HCL 1 MG/ML IJ SOLN: 0.5000 mg | INTRAMUSCULAR | Status: DC | PRN

## 2022-01-31 NOTE — Progress Notes (Signed)
RN called RT to bedside pt O2 stat 82% on 5L [place]. Pt placed on ATC 8L/30% with increase to O2 95% and stable. Will continue to monitor

## 2022-01-31 NOTE — TOC Initial Note (Signed)
Transition of Care [facility]) - Initial/Assessment Note    Patient Details  Name: Judy Meza MRN: [id number] Date of Birth: 24-Aug-1960  Transition of Care [facility]) CM/SW Contact:    Emeterio Reeve, LCSW Phone Number: 01/31/2022, 3:05 PM  Clinical Narrative:                  CSW sent referral to [place]. Family is interested in the [facility] house. LCSW gave referral to [facility]. There is not a bed available today. They will follow up with family when a bed is available.   Expected Discharge Plan: [place]     Patient Goals and CMS Choice            Expected Discharge Plan and Services                                              Prior Living Arrangements/Services                       Activities of Daily Living      Permission Sought/Granted                  Emotional Assessment              Admission diagnosis:  Hepatic encephalopathy ([place]) [K76.82] Dehydration [E86.0] AKI (acute kidney injury) ([place]) [N17.9] Acute renal failure, unspecified acute renal failure type ([place]) [N17.9] Patient Active Problem List   Diagnosis Date Noted   Heme positive stool 01/28/2022   SIRS (systemic inflammatory response syndrome) ([place]) 01/29/2022   Normocytic anemia 02/11/2022   GI bleed 01/29/2022   Chronic diastolic CHF (congestive heart failure) ([place]) 01/31/2022   Hyperosmolar hyperglycemic state (HHS) ([place]) 10/28/2020   Tracheo-cutaneous fistula 09/27/2020   End stage liver disease ([place]) 11/11/2019   Secondary esophageal varices with bleeding ([place])    Esophageal stricture    Cirrhosis ([place])    Decompensated hepatic cirrhosis ([place]) 10/03/2019   Acute on chronic anemia 10/03/2019   Cancer screening 08/14/2019   Back pain with radiculopathy 08/07/2019   Recurrent falls 08/04/2019   Multiple falls at home 08/03/2019   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   Goals of care, counseling/discussion  07/28/2019   Acute encephalopathy 06/05/2019   Chest pain 06/04/2019   QT prolongation 06/04/2019   Lactic acidosis 05/10/2019   Dementia without behavioral disturbance ([place]) 05/10/2019   Chronic hepatitis C ([place]) 05/10/2019   Hepatic encephalopathy ([place]) 05/09/2019   Pancytopenia, acquired ([place])    Cirrhosis of liver ([place])    CKD (chronic kidney disease) stage 4, GFR 15-29 ml/min ([place]) 04/29/2019   COPD with chronic bronchitis 04/29/2019   Tobacco abuse 04/29/2019   Alcoholism in remission ([place]) 04/29/2019   Acute hepatic encephalopathy ([place]) 04/29/2019   AKI (acute kidney injury) ([place]) 60/10/9321   Alcoholic cirrhosis of liver with ascites ([place]) 04/29/2019   Thrombocytopenia ([place]) 04/29/2019   Hyperkalemia 04/29/2019   PCP:  Caprice Renshaw, MD Pharmacy:   [facility] [place], [place] [address] [place] [address] Phone: [phone] Fax: [phone]  [place], [place] [address] Phone: [phone] Fax: [place], [place] -  [address] [place] [address] Phone: [phone] Fax: [phone]     Social Determinants of Health (SDOH) Social History: SDOH Screenings   Depression (PHQ2-9): Medium Risk (11/25/2021)  Tobacco Use: High Risk (01/22/2022)   SDOH Interventions:     Readmission Risk Interventions    10/05/2019    4:04 PM 08/07/2019   10:55 AM 06/06/2019    1:55 PM  Readmission Risk Prevention Plan  Transportation Screening Complete Complete Complete  HRI or Home Care Consult  Not Complete   HRI or Home Care Consult comments  needs SNF   Social Work Consult for [place] Planning/Counseling  Complete   Palliative Care Screening  Not Applicable   Medication Review Press photographer) Complete Complete Complete  PCP or  Specialist appointment within 3-5 days of discharge Complete    HRI or Home Care Consult Complete  Complete  SW Recovery Care/Counseling Consult Complete  Complete  Palliative Care Screening Not Applicable  Not [place] Not Applicable  Complete

## 2022-01-31 NOTE — Progress Notes (Addendum)
PROGRESS NOTE    Judy Meza  GLO:[id number] DOB: August 14, 1960 DOA: 02/11/2022 PCP: Caprice Renshaw, MD  Brief Narrative:  Judy Meza is a 62 y.o. 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  Her mental status and neurological status remained poor and did not improve throughout hospitalization.  1/17: GI evaluated patient and recommended hospice care. 1/19: Head CT was ordered and showed a <5 mm subdural hematoma.  Neurosurgery recommended no intervention.  Patient developed a partial small bowel obstruction, which was managed with a nasogastric tube. 1/20: Patient became hypoxic overnight requiring nasal cannula.  Family decided to transition to comfort care.  Assessment & Plan:   Acute Severe Hepatic Encephalopathy: Judy Meza remained in a significantly somnolent and semi-stuperous state throughout hospitalization despite maximal medical therapy.   - On 1/17, patient was evaluated by GI Dr. Havery Moros who recommended Hospice Care. - On 1/20, family decided to transition patient to comfort care.  Dilaudid and Ativan are ordered PRN. - If patient survives, she will be discharged to [facility].  Acute Hypoxic Respiratory Failure / History of Laryngeal Cancer s/p Radiation therapy and development of Tracheoesophageal Fistula: - On 1/19, patient became increasing hypoxic requiring nasal cannula.  <5 mm  Post-Traumatic Subdural Hematoma: - Neurosurgery recommended no intervention.  Acute Hypernatremia: -  Sodium levels remained high despite aggressive management. This indicates poor prognosis.  Partial Small Bowel Obstruction: - Her obstruction improved with placement of a nasogastric tube.  Acute Renal Failure:  - Renal function continued to worsen despite maximal medical therapy including Albumin.  Acute Blood Loss Anemia:  - HH remained at the lower end of normal. - GI was consulted and family refused endoscopy.    Aerococcus UTI: This is a fastidious gram positive cocci that is typically sensitive to penicillins.   - Judy Meza received a course of IV antibiotics but did not show any clinical improvement.  Grade 2 Diastolic Heart Failure:  Diabetes Mellitus Type 2: - Lispro SSI with POC glucose ACHS.   DVT prophylaxis: On hold due to low hemoglobin. Code Status: DNI/DNR Family Communication: Updated at bedside and over the phone  Status is: inpatient  Dispo: The patient is from: SNF              Anticipated d/c is to: [facility]              Anticipated d/c date is: <24 hours              Patient currently not medically stable for discharge  Consultants:  GI  Subjective: Judy Meza has drooping eyelids and has developed labored breathing. She has no eye or verbal response and limited motor response to pain.  Objective: Vitals:   01/31/22 0600 01/31/22 0800 01/31/22 0820 01/31/22 1210  BP: (!) 194/104 (!) 154/93    Pulse: 88  82 74  Resp: 16 (!) '21 18 16  '$ Temp:      TempSrc:  SpO2: 94% 91% 93%   Weight:        Intake/Output Summary (Last 24 hours) at 01/31/2022 1534 Last data filed at 01/31/2022 1194 Gross per 24 hour  Intake 843.12 ml  Output 150 ml  Net 693.12 ml    Filed Weights   01/30/22 0500 01/31/22 0500  Weight: 59.5 kg 55.4 kg    Examination:  Physical Exam Constitutional:      Appearance: She is ill-appearing.      Comments: Somnolent and lethargic On nasal cannula  HENT:     Head: Normocephalic and atraumatic.     Mouth/Throat:     Mouth: Mucous membranes are dry.  Eyes:     Comments: Drooping eyelids  Cardiovascular:     Rate and Rhythm: Normal rate and regular rhythm.     Pulses: Normal pulses.     Heart sounds: Normal heart sounds.  Pulmonary:     Comments: Labored breathing pattern Abdominal:     Palpations: Abdomen is soft.     Tenderness: There is no abdominal tenderness.  Musculoskeletal:     Cervical back: Neck supple. No rigidity.  Skin:    General: Skin is warm and dry.     Capillary Refill: Capillary refill takes less than 2 seconds.  Neurological:     Comments: Poor neurological status      Data Reviewed: I have personally reviewed following labs and imaging studies  CBC: Recent Labs  Lab 01/12/2022 0630 01/26/2022 2205 01/28/22 0508 01/29/22 0513 01/30/22 0553 01/31/22 0552  WBC 17.6*  --  12.5* 12.9* 8.5 5.5  NEUTROABS 14.3*  --   --   --   --   --   HGB 6.6* 8.2* 8.3* 8.3* 7.8* 7.0*  HCT 21.9* 26.0* 27.3* 27.9* 25.7* 23.5*  MCV 87.3  --  87.8 88.9 88.0 90.4  PLT 98*  --  81* 101* 59* 40*    Basic Metabolic Panel: Recent Labs  Lab 01/28/22 0508 01/29/22 0513 01/30/22 0553 01/30/22 1414 01/31/22 0552  NA 146* 151* 155* 156* 157*  K 4.2 4.7 3.5 3.6 3.3*  CL 114* 120* 126* 128* 129*  CO2 20* 18* 17* 16* 17*  GLUCOSE 132* 152* 129* 94 141*  BUN 60* 62* 68* 67* 68*  CREATININE 4.65* 4.15* 4.30* 4.17* 4.27*  CALCIUM 8.9 8.7* 8.8* 8.8* 9.3    GFR: Estimated Creatinine Clearance: 11.2 mL/min (A) (by C-G formula based on SCr of 4.27 mg/dL (H)). Liver Function Tests: Recent Labs  Lab 01/14/2022 0630 01/28/22 0508 01/29/22 0513 01/30/22 0553 01/31/22 0552  AST 63* 57* 70* 51* 40  ALT 35 34 32 31 23  ALKPHOS 98 89 101 101 75  BILITOT 2.4* 2.6* 2.9* 4.1* 3.9*  PROT 7.1 7.0 6.5 6.4* 6.6  ALBUMIN 2.3* 2.4* 2.3* 2.1* 3.2*    No results for input(s):  "LIPASE", "AMYLASE" in the last 168 hours. Recent Labs  Lab 01/30/2022 0728 01/29/22 0513 01/30/22 0553 01/31/22 0552  AMMONIA 70* '27 25 31    '$ Coagulation Profile: Recent Labs  Lab 01/12/2022 0630  INR 1.6*    Cardiac Enzymes: No results for input(s): "CKTOTAL", "CKMB", "CKMBINDEX", "TROPONINI" in the last 168 hours. BNP (last 3 results) No results for input(s): "PROBNP" in the last 8760 hours. HbA1C: No results for input(s): "HGBA1C" in the last 72 hours. CBG: Recent Labs  Lab 01/30/22 1233 01/30/22 1659 01/30/22 2016 01/31/22 0023 01/31/22 0550  GLUCAP 96 97 103* 109* 122*    Sepsis Labs: Recent Labs  Lab 02/08/2022 0630 01/20/2022 0643 02/07/2022 0713 01/28/22 0508  PROCALCITON 6.49  --   --   --   LATICACIDVEN  --  3.2* 3.5* 2.9*     Recent Results (from the past 240 hour(s))  Blood Culture (routine x 2)     Status: None (Preliminary result)   Collection Time: 01/12/2022  5:41 AM   Specimen: BLOOD RIGHT ARM  Result Value Ref Range Status   Specimen Description BLOOD RIGHT ARM  Final   Special Requests   Final    BOTTLES DRAWN AEROBIC AND ANAEROBIC Blood Culture adequate volume   Culture   Final    NO GROWTH 4 DAYS Performed at [facility], [place] [address] [place], [place] [address]    Report Status PENDING  Incomplete  Urine Culture     Status: Abnormal   Collection Time: 01/18/2022  5:41 AM   Specimen: In/Out Cath Urine  Result Value Ref Range Status   Specimen Description IN/OUT CATH URINE  Final   Special Requests NONE  Final   Culture (A)  Final    >=100,000 COLONIES/mL AEROCOCCUS SPECIES Standardized susceptibility testing for this organism is not available. Performed at [facility], [place] [address] [place], [place] [address]    Report Status 01/28/2022 FINAL  Final  Resp panel by RT-PCR (RSV, Flu A&B, Covid) Anterior Nasal Swab     Status: Abnormal   Collection Time: 01/31/2022  8:07 AM   Specimen: Anterior Nasal Swab  Result Value Ref  Range Status   SARS Coronavirus 2 by RT PCR NEGATIVE NEGATIVE Final    Comment: (NOTE) SARS-CoV-2 target nucleic acids are NOT DETECTED.  The SARS-CoV-2 RNA is generally detectable in upper respiratory specimens during the acute phase of infection. The lowest concentration of SARS-CoV-2 viral copies this assay can detect is 138 copies/mL. A negative result does not preclude SARS-Cov-2 infection and should not be used as the sole basis for treatment or other patient management decisions. A negative result may occur with  improper specimen collection/handling, submission of specimen other than nasopharyngeal swab, presence of viral mutation(s) within the areas targeted by this assay, and inadequate number of viral copies(<138 copies/mL). A negative result must be combined with clinical observations, patient history, and epidemiological information. The expected result is Negative.  Fact Sheet for Patients:  EntrepreneurPulse.com.au  Fact Sheet for Healthcare Providers:  IncredibleEmployment.be  This test is no t yet approved or cleared by the [place] FDA and  has been authorized for detection and/or diagnosis of SARS-CoV-2 by FDA under an Emergency Use Authorization (EUA). This EUA will remain  in effect (meaning this test can be used) for the duration of the COVID-19 declaration under Section 564(b)(1) of the Act, 21 U.S.C.section 360bbb-3(b)(1), unless the authorization is terminated  or revoked sooner.       Influenza A by PCR NEGATIVE NEGATIVE Final   Influenza B by PCR NEGATIVE NEGATIVE Final    Comment: (NOTE) The Xpert Xpress SARS-CoV-2/FLU/RSV plus assay is intended as an aid in the diagnosis of influenza from Nasopharyngeal swab specimens and should not be used as a sole basis for treatment. Nasal washings and aspirates are unacceptable for Xpert Xpress SARS-CoV-2/FLU/RSV testing.  Fact Sheet for  Patients: EntrepreneurPulse.com.au  Fact Sheet for Healthcare Providers: IncredibleEmployment.be  This test is not yet approved or cleared by the [place] FDA and has been authorized for detection and/or diagnosis of SARS-CoV-2 by FDA under an Emergency Use Authorization (EUA). This EUA  will remain in effect (meaning this test can be used) for the duration of the COVID-19 declaration under Section 564(b)(1) of the Act, 21 U.S.C. section 360bbb-3(b)(1), unless the authorization is terminated or revoked.     Resp Syncytial Virus by PCR POSITIVE (A) NEGATIVE Final    Comment: (NOTE) Fact Sheet for Patients: EntrepreneurPulse.com.au  Fact Sheet for Healthcare Providers: IncredibleEmployment.be  This test is not yet approved or cleared by the [place] FDA and has been authorized for detection and/or diagnosis of SARS-CoV-2 by FDA under an Emergency Use Authorization (EUA). This EUA will remain in effect (meaning this test can be used) for the duration of the COVID-19 declaration under Section 564(b)(1) of the Act, 21 U.S.C. section 360bbb-3(b)(1), unless the authorization is terminated or revoked.  Performed at [facility], [place] [address], [place], [place] [address]     Radiology Studies: DG Abd 1 View  Result Date: 01/31/2022 CLINICAL DATA:  Evaluate for ileus versus partial small bowel obstruction. EXAM: ABDOMEN - 1 VIEW COMPARISON:  01/30/2022 FINDINGS: Nasogastric tube unchanged with tip and side-port over the stomach in the left abdomen. Air is present over the transverse colon. Resolution of previously seen air-filled dilated small bowel loops in the left abdomen. There are no current dilated small bowel loops. No evidence of air-fluid levels or free peritoneal air. Remainder of the exam is unchanged. IMPRESSION: Resolution of previously seen air-filled dilated small bowel loops in the left  abdomen. Nasogastric tube unchanged with tip and side-port over the stomach in the left abdomen. Electronically Signed   By: Marin Olp M.D.   On: 01/31/2022 09:14   CT HEAD WO CONTRAST (5MM)  Result Date: 01/30/2022 CLINICAL DATA:  Provided history: Subdural hematoma. Evaluate for progression of left subdural hematoma. EXAM: CT HEAD WITHOUT CONTRAST TECHNIQUE: Contiguous axial images were obtained from the base of the skull through the vertex without intravenous contrast. RADIATION DOSE REDUCTION: This exam was performed according to the departmental dose-optimization program which includes automated exposure control, adjustment of the mA and/or kV according to patient size and/or use of iterative reconstruction technique. COMPARISON:  Head CT 01/29/2022. FINDINGS: Brain: Acute subdural hematoma overlying left cerebral hemisphere (measuring up to 6 mm in thickness), unchanged in size from the prior head CT of 01/29/2022 (remeasured on prior). As before, the majority of the subdural hematoma is present along the left cerebral convexity, but a small amount of hemorrhage is also present along the posterior-inferior falx and left tentorium. Unchanged mass effect upon the underlying left cerebral hemisphere without ventricular effacement or midline shift. Mild patchy and ill-defined hypoattenuation within the cerebral white matter, nonspecific but compatible with chronic small vessel image disease. 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 No demarcated cortical infarct. No evidence of an intracranial mass. Vascular: No hyperdense vessel.  Atherosclerotic calcifications. Skull: No fracture or aggressive osseous lesion. Sinuses/Orbits: No mass or acute finding within the imaged orbits. Minimal mucosal thickening within a posterior left ethmoid air cell. IMPRESSION: Acute subdural hematoma overlying the left cerebral hemisphere, unchanged in size from yesterday's head CT. Electronically  Signed   By: Kellie Simmering D.O.   On: 01/30/2022 16:30   DG Abd 1 View  Result Date: 01/30/2022 CLINICAL DATA:  Nausea and vomiting, NG tube. EXAM: ABDOMEN - 1 VIEW COMPARISON:  Chest x-ray 01/22/2022 FINDINGS: Enteric tube tip is at the level of the body of the stomach. There are dilated air-filled small bowel loops in the mid abdomen and left upper  quadrant measuring up to 3.5 cm. Air seen throughout nondilated colon to the level the rectum. There are vascular calcifications in the soft tissues. No acute fractures are seen. There are some patchy airspace opacities in the right lung base, new from prior. IMPRESSION: 1. Enteric tube tip is at the level of the body of the stomach. 2. Dilated air-filled small bowel loops in the mid abdomen and left upper quadrant. Findings may represent ileus or partial small bowel obstruction. Electronically Signed   By: Ronney Asters M.D.   On: 01/30/2022 15:37   EEG adult  Result Date: 01/30/2022 Lora Havens, MD     01/30/2022  1:26 PM Patient Name: MAHNOOR MATHISEN MRN: [id number] Epilepsy Attending: Lora Havens Referring Physician/Provider: George Hugh, MD Date: 01/30/2022 Duration: 22.55 mins Patient history: 62yo F with ams. EEG to evaluate for seizure Level of alertness: lethargic AEDs during EEG study: None Technical aspects: This EEG study was done with scalp electrodes positioned according to the 10-20 International system of electrode placement. Electrical activity was reviewed with band pass filter of 1-'70Hz'$ , sensitivity of 7 uV/mm, display speed of 72m/sec with a '60Hz'$  notched filter applied as appropriate. EEG data were recorded continuously and digitally stored.  Video monitoring was available and reviewed as appropriate. Description: EEG showed continuous generalized low amplitude 3 to 5 Hz theta-delta slowing. Hyperventilation and photic stimulation were not performed.   Of note study was technically difficult due to significant movement and  electrode artifact. ABNORMALITY - Continuous slow, generalized IMPRESSION: This technically difficult study is suggestive of severe diffuse encephalopathy, nonspecific etiology. No seizures or epileptiform discharges were seen throughout the recording. Priyanka OBarbra Sarks   Scheduled Meds:  glycopyrrolate  0.4 mg Intravenous Q4H    HYDROmorphone (DILAUDID) injection  1 mg Intravenous Q4H   ketotifen  1 drop Both Eyes Daily   latanoprost  1 drop Both Eyes QHS   lidocaine  1 patch Transdermal Daily   pantoprazole (PROTONIX) IV  40 mg Intravenous Q12H   Continuous Infusions:  famotidine (PEPCID) IV 20 mg (01/31/22 0010)     LOS: 4 days   Time spent: 487m  HaGeorge HughMD [facility]  If 7PM-7AM, please contact night-coverage [email or web address]  01/31/2022, 3:34 PM

## 2022-01-31 NOTE — Progress Notes (Signed)
Daily Progress Note   Patient Name: Judy Meza       Date: 01/31/2022 DOB: 07/02/1960  Age: 62 y.o. MRN#: [id number] Attending Physician: George Hugh, MD Primary Care Physician: Caprice Renshaw, MD Admit Date: 01/12/2022  Reason for Consultation/Follow-up: Establishing goals of care  Subjective: Medical records reviewed including progress notes and labs. Patient assessed at the bedside.  She will not in response to questions, unable to open her eyes.  Her sister Judy Meza is present for scheduled family meeting.  She then called her siblings and patient's sisters Judy Meza, and Judy Meza were able to participate via video call.  Discussed with RN.  Reviewed patient's CT scan from yesterday, which shows no change in size of her SDH.  Reviewed labs today including worsening creatinine and slightly higher sodium.  We discussed patient's desaturations overnight and increasing oxygen requirements.  Reviewed that this is likely due to her worsening weakness and inability to take efficient breaths.  She remains lethargic with no improvement in the past few days.  Family wonders if she has received any pain medicines MRI was reviewed.  She has not received further Dilaudid since our conversation yesterday.  Family is all in agreement to proceed with comfort focused care today.  Comfort care was reviewed in detail.  Referral process to residential hospice was reviewed in detail and they confirm they would like to proceed with a referral to the [place] [place] facility.  The process of weaning oxygen to avoid prolonging the dying process was reviewed.  They are in agreement with this plan.  Patient's son will be visiting today and may desire further conversation.  Questions and concerns addressed. PMT  will continue to support holistically.   Length of Stay: 4  Physical Exam Vitals and nursing note reviewed.  Constitutional:      General: She is not in acute distress.    Appearance: She is ill-appearing.     Interventions: Nasal cannula in place.     Comments: NGT in place  Cardiovascular:     Rate and Rhythm: Normal rate.  Pulmonary:     Effort: Pulmonary effort is normal.  Neurological:     Mental Status: She is lethargic.     Motor: Weakness present.  Psychiatric:        Speech: She is noncommunicative.  Cognition and Memory: Cognition is impaired.            Vital Signs: BP (!) 154/93 (BP Location: Left Arm)   Pulse 82   Temp 97.8 F (36.6 C) (Axillary)   Resp 18   Wt 55.4 kg   SpO2 93%   BMI 22.34 kg/m  SpO2: SpO2: 93 % O2 Device: O2 Device: Tracheostomy Collar O2 Flow Rate: O2 Flow Rate (L/min): 8 L/min      Palliative Assessment/Data: 10%   Palliative Care Assessment & Plan   Patient Profile: 62 y.o. female  with past medical history of PMH significant for prior alcohol abuse, hepatitis C, liver cirrhosis, diastolic CHF, DM2, HTN, COPD, anxiety/depression, dementia, laryngeal CA s/p radiation and tracheostomy, complicated by tracheocutaneous fistula, CKD stage 3, NPH, prior IV drug use, and OSA  admitted on 02/07/2022 with altered mental status.    Patient is admitted with acute hepatic encephalopathy, presenting ammonia level was 70.  Also with SIRS, lactic acidosis, acute renal failure, possible GI bleed.  Unfortunately she had a fall overnight while admitted and imaging showed acute traumatic 5 mm subdural hematoma. PMT has been consulted to assist with goals of care conversation.  Assessment: Goals of care conversation Acute hepatic encephalopathy Acute traumatic SDH Acute renal failure on CKD 3 SIRS Acute blood loss anemia UTI  Recommendations/Plan: Continue DNR Transition to comfort care today Dilaudid 1 mg IV every 4 hours scheduled for  pain/air hunger/comfort Dilaudid 0.5 mg IV every 30 minutes as needed for pain/air hunger/comfort Robinul 0.4 mg IV every 4 hours for excessive secretions Ativan PRN for agitation/anxiety Zofran PRN for nausea Liquifilm tears PRN for dry eyes May have comfort feeding Unrestricted visitations in the setting of EOL (per policy) Oxygen PRN 2L or less for comfort. No escalation.  TOC consulted for referral to residential hospice facility in [facility], assistance is appreciated Psychosocial and emotional support provided PMT will continue to follow and support   Prognosis:  < 2 weeks  Discharge Planning: Hospice facility  Care plan was discussed with patient, patient's siblings, RN, Dr. Lavera Guise, [facility]    MDM high         Trevone Prestwood Johnnette Litter, [place]  Palliative Medicine Team Team phone # [phone]  Thank you for allowing the Palliative Medicine Team to assist in the care of this patient. Please utilize secure chat with additional questions, if there is no response within 30 minutes please call the above phone number.  Palliative Medicine Team providers are available by phone from 7am to 7pm daily and can be reached through the team cell phone.  Should this patient require assistance outside of these hours, please call the patient's attending physician.

## 2022-02-01 DIAGNOSIS — Z515 Encounter for palliative care: Secondary | ICD-10-CM

## 2022-02-01 DIAGNOSIS — Z7189 Other specified counseling: Secondary | ICD-10-CM | POA: Diagnosis not present

## 2022-02-01 DIAGNOSIS — E860 Dehydration: Secondary | ICD-10-CM | POA: Diagnosis not present

## 2022-02-01 DIAGNOSIS — K7682 Hepatic encephalopathy: Secondary | ICD-10-CM | POA: Diagnosis not present

## 2022-02-01 DIAGNOSIS — R6510 Systemic inflammatory response syndrome (SIRS) of non-infectious origin without acute organ dysfunction: Secondary | ICD-10-CM | POA: Diagnosis not present

## 2022-02-01 LAB — CULTURE, BLOOD (ROUTINE X 2)
Culture: NO GROWTH
Special Requests: ADEQUATE

## 2022-02-01 MED ORDER — HYDROMORPHONE HCL-NACL 50-0.9 MG/50ML-% IV SOLN
0.5000 mg/h | INTRAVENOUS | Status: DC
  Administered 2022-02-01: 0.5 mg/h via INTRAVENOUS
  Filled 2022-02-01: qty 50

## 2022-02-01 MED ORDER — HYDROMORPHONE BOLUS VIA INFUSION
0.5000 mg | INTRAVENOUS | Status: DC | PRN
  Administered 2022-02-01 (×2): 0.5 mg via INTRAVENOUS

## 2022-02-12 NOTE — Progress Notes (Signed)
Daily Progress Note   Patient Name: Judy Meza       Date: 2022/02/04 DOB: 06-Aug-1960  Age: 62 y.o. MRN#: [id number] Attending Physician: George Hugh, MD Primary Care Physician: Caprice Renshaw, MD Admit Date: 02/10/2022  Reason for Consultation/Follow-up: Establishing goals of care  Subjective: Medical records reviewed.  Patient received 4 doses scheduled Dilaudid in the past 24 hours.  No PRN doses were given.  Patient assessed at the bedside.  She is breathing comfortably, appears to be imminently dying.  Her sister Judy Meza is present discussing with Dr. Lavera Guise. Patient's 3 sisters are also participating via phone. Patient is on 4 L of oxygen.  Patient is no longer stable for transport to a hospice facility.  They are open to any suggestions to keep her as comfortable as possible, understanding that she will pass in the hospital soon.  The option for initiation of a Dilaudid infusion was discussed, as well as continuing to titrate down supplemental oxygen and avoid prolonging the dying process.  They are agreeable.  Oxygen was weaned down to 3 L and patient tolerated well.  Family then becomes concerned that patient is crying and seems to have been disturbed by the update received.  The option of IV Ativan was discussed and they are agreeable to patient receiving a dose to calm down any anxiety she now has as the results of the conversation.  Emotional support therapeutic listening was provided.  Questions and concerns addressed. PMT will continue to support holistically.   Length of Stay: 5  Physical Exam Vitals and nursing note reviewed.  Constitutional:      General: She is not in acute distress.    Appearance: She is ill-appearing.     Interventions: Nasal cannula in place.      Comments: NGT in place  Cardiovascular:     Rate and Rhythm: Normal rate.  Pulmonary:     Effort: Pulmonary effort is normal.  Neurological:     Mental Status: She is lethargic.     Motor: Weakness present.  Psychiatric:        Speech: She is noncommunicative.        Cognition and Memory: Cognition is impaired.            Vital Signs: BP (!) 88/68 (BP Location: Left Arm)   Pulse  62   Temp 97.6 F (36.4 C) (Axillary)   Resp 13   Wt 55.4 kg   SpO2 (!) 84%   BMI 22.34 kg/m  SpO2: SpO2: (!) 84 % O2 Device: O2 Device: Tracheostomy Collar O2 Flow Rate: O2 Flow Rate (L/min): 4 L/min      Palliative Assessment/Data: 10%   Palliative Care Assessment & Plan   Patient Profile: 62 y.o. female  with past medical history of PMH significant for prior alcohol abuse, hepatitis C, liver cirrhosis, diastolic CHF, DM2, HTN, COPD, anxiety/depression, dementia, laryngeal CA s/p radiation and tracheostomy, complicated by tracheocutaneous fistula, CKD stage 3, NPH, prior IV drug use, and OSA  admitted on 02/05/2022 with altered mental status.    Patient is admitted with acute hepatic encephalopathy, presenting ammonia level was 70.  Also with SIRS, lactic acidosis, acute renal failure, possible GI bleed.  Unfortunately she had a fall overnight while admitted and imaging showed acute traumatic 5 mm subdural hematoma. PMT has been consulted to assist with goals of care conversation.  Assessment: Goals of care conversation Acute hepatic encephalopathy Acute traumatic SDH Acute renal failure on CKD 3 SIRS Acute blood loss anemia UTI  Recommendations/Plan: Continue DNR Continue comfort focused care Dilaudid gtt ordered for pain/air hunger/comfort and boluses for breakthrough symptoms Q15 minutes PRN  Continue scheduled Robinul  Give one dose of PRN Ativan now, Discussed with PRN Unrestricted visitations in the setting of EOL (per policy) Continue titration of oxygen to PRN 2L or less for  comfort. No escalation.  Psychosocial and emotional support provided PMT will continue to follow and support   Prognosis:  Hours - Days  Discharge Planning: Anticipated Hospital Death  Care plan was discussed with patient, patient's siblings, RN, Dr. Lavera Guise    MDM high         Jillane Po Johnnette Litter, PA-C  Palliative Medicine Team Team phone # [phone]  Thank you for allowing the Palliative Medicine Team to assist in the care of this patient. Please utilize secure chat with additional questions, if there is no response within 30 minutes please call the above phone number.  Palliative Medicine Team providers are available by phone from 7am to 7pm daily and can be reached through the team cell phone.  Should this patient require assistance outside of these hours, please call the patient's attending physician.

## 2022-02-12 NOTE — Death Summary Note (Addendum)
DEATH SUMMARY   Patient Details  Name: Judy Meza MRN: [id number] DOB: [birth year] CBJ:SEGBT, Judy Olden, MD Admission/Discharge Information   Admit Date:  Jan 29, 2022  Date of Death:  02/03/2022  Time of Death:  Mar 27, 1850  Length of Stay: 5   Principle Cause of death: Hepatic Encephalopathy  Hospital Diagnoses: Principal Problem:   Hepatic encephalopathy ([place]) Active Problems:   Lactic acidosis   SIRS (systemic inflammatory response syndrome) (HCC)   Normocytic anemia   GI bleed   AKI (acute kidney injury) ([place])   Chronic diastolic CHF (congestive heart failure) (HCC)   Heme positive stool   Hospital Course: Judy Meza is a 62 y.o. 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  She was also found to have a small subdural hematoma and also found to have a partial small bowel obstruction.  She later developed acute hypoxic respiratory failure.  Despite maximal medical therapy, patient did not clinically improve and continued to decline.  Her mental status and neurological status remained extremely poor.  Gastroenterology was consulted and recommended comfort measures.   On 1/20, family decided to transition to comfort care. On 02-03-2022, patient passed away at [place].  Family was at bedside.  Consultations: Gastroenterology  The results of significant diagnostics from this hospitalization (including imaging, microbiology, ancillary and  laboratory) are listed below for reference.   Significant Diagnostic Studies: DG Abd 1 View  Result Date: 01/31/2022 CLINICAL DATA:  Evaluate for ileus versus partial small bowel obstruction. EXAM: ABDOMEN - 1 VIEW COMPARISON:  01/30/2022 FINDINGS: Nasogastric tube unchanged with tip and side-port over the stomach in the left abdomen. Air is present over the transverse colon. Resolution of previously seen air-filled dilated small bowel loops in the left abdomen. There are no current dilated small bowel loops. No evidence of air-fluid levels or free peritoneal air. Remainder of the exam is unchanged. IMPRESSION: Resolution of previously seen air-filled dilated small bowel loops in the left abdomen. Nasogastric tube unchanged with tip and side-port over the stomach in the left abdomen. Electronically Signed   By: Marin Olp M.D.   On: 01/31/2022 09:14   CT HEAD WO CONTRAST (5MM)  Result Date: 01/30/2022 CLINICAL DATA:  Provided history: Subdural hematoma. Evaluate for progression of left subdural hematoma. EXAM: CT HEAD WITHOUT CONTRAST TECHNIQUE: Contiguous axial images were obtained from the base of the skull through the vertex without intravenous contrast. RADIATION DOSE REDUCTION: This exam was performed according to the departmental dose-optimization program which includes automated exposure control, adjustment of the mA and/or kV according to patient size and/or use of iterative reconstruction technique. COMPARISON:  Head CT 01/29/2022. FINDINGS: Brain: Acute subdural hematoma overlying left cerebral hemisphere (measuring up to 6 mm in thickness), unchanged in size from the prior head CT of 01/29/2022 (remeasured on prior). As before, the majority of the subdural hematoma is present along the left cerebral convexity, but a small amount of hemorrhage is also present along the posterior-inferior falx and left tentorium. Unchanged mass effect upon the underlying left cerebral  hemisphere without  ventricular effacement or midline shift. Mild patchy and ill-defined hypoattenuation within the cerebral white matter, nonspecific but compatible with chronic small vessel image disease. 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 No demarcated cortical infarct. No evidence of an intracranial mass. Vascular: No hyperdense vessel.  Atherosclerotic calcifications. Skull: No fracture or aggressive osseous lesion. Sinuses/Orbits: No mass or acute finding within the imaged orbits. Minimal mucosal thickening within a posterior left ethmoid air cell. IMPRESSION: Acute subdural hematoma overlying the left cerebral hemisphere, unchanged in size from yesterday's head CT. Electronically Signed   By: Kellie Simmering D.O.   On: 01/30/2022 16:30   DG Abd 1 View  Result Date: 01/30/2022 CLINICAL DATA:  Nausea and vomiting, NG tube. EXAM: ABDOMEN - 1 VIEW COMPARISON:  Chest x-ray 02/08/2022 FINDINGS: Enteric tube tip is at the level of the body of the stomach. There are dilated air-filled small bowel loops in the mid abdomen and left upper quadrant measuring up to 3.5 cm. Air seen throughout nondilated colon to the level the rectum. There are vascular calcifications in the soft tissues. No acute fractures are seen. There are some patchy airspace opacities in the right lung base, new from prior. IMPRESSION: 1. Enteric tube tip is at the level of the body of the stomach. 2. Dilated air-filled small bowel loops in the mid abdomen and left upper quadrant. Findings may represent ileus or partial small bowel obstruction. Electronically Signed   By: Ronney Asters M.D.   On: 01/30/2022 15:37   EEG adult  Result Date: 01/30/2022 Judy Havens, MD     01/30/2022  1:26 PM Patient Name: Judy Meza MRN: [id number] Epilepsy Attending: Lora Meza Referring Physician/Provider: George Hugh, MD Date: 01/30/2022 Duration: 22.55 mins Patient history: 62yo F with ams. EEG to evaluate for seizure Level of  alertness: lethargic AEDs during EEG study: None Technical aspects: This EEG study was done with scalp electrodes positioned according to the 10-20 International system of electrode placement. Electrical activity was reviewed with band pass filter of 1-'70Hz'$ , sensitivity of 7 uV/mm, display speed of 66m/sec with a '60Hz'$  notched filter applied as appropriate. EEG data were recorded continuously and digitally stored.  Video monitoring was available and reviewed as appropriate. Description: EEG showed continuous generalized low amplitude 3 to 5 Hz theta-delta slowing. Hyperventilation and photic stimulation were not performed.   Of note study was technically difficult due to significant movement and electrode artifact. ABNORMALITY - Continuous slow, generalized IMPRESSION: This technically difficult study is suggestive of severe diffuse encephalopathy, nonspecific etiology. No seizures or epileptiform discharges were seen throughout the recording. Priyanka OBarbra Sarks  CT HEAD WO CONTRAST (5MM)  Result Date: 01/29/2022 CLINICAL DATA:  Altered mental status, found on floor next to bed EXAM: CT HEAD WITHOUT CONTRAST TECHNIQUE: Contiguous axial images were obtained from the base of the skull through the vertex without intravenous contrast. RADIATION DOSE REDUCTION: This exam was performed according to the departmental dose-optimization program which includes automated exposure control, adjustment of the mA and/or kV according to patient size and/or use of iterative reconstruction technique. COMPARISON:  01/10/2022 FINDINGS: Brain: High-density subdural hematoma along the left cerebral convexity spanning the frontal and parietal lobes and measuring up to 5 mm in thickness. No associated midline shift. 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 No evidence of acute infarct. No hydrocephalus or mass. Vascular: No hyperdense vessel or unexpected calcification.  Skull: Normal. Negative  for fracture or focal lesion. Sinuses/Orbits: No evidence of injury See Z vision dashboard concerning attempts for direct communication. Critical Value/emergent results were called by telephone at the time of interpretation on 01/29/2022 at 5:55 Am to provider Jennette Kettle , who verbally acknowledged these results. IMPRESSION: Acute subdural hematoma on the left measuring up to 5 mm in thickness. Electronically Signed   By: Jorje Guild M.D.   On: 01/29/2022 06:02   DG Chest Port 1 View  Result Date: 02/09/2022 CLINICAL DATA:  Questionable sepsis.  Evaluate for abnormality. EXAM: PORTABLE CHEST 1 VIEW COMPARISON:  10/28/2020 FINDINGS: Stable cardiomediastinal contours. No pleural effusion or edema. No airspace opacities identified. Visualized osseous structures are unremarkable. IMPRESSION: No active disease. Electronically Signed   By: Kerby Moors M.D.   On: 01/19/2022 05:56   DG Swallowing Func-Speech Pathology  Result Date: 01/13/2022 Table formatting from the original result was not included. Objective Swallowing Evaluation: Type of Study: MBS-Modified Barium Swallow Study  Patient Details Name: JENEVIEVE KIRSCHBAUM MRN: [id number] Date of Birth: [birth year] Today's Date: 01/13/2022 Time: SLP Start Time (ACUTE ONLY): 0935 -SLP Stop Time (ACUTE ONLY): 4742 SLP Time Calculation (min) (ACUTE ONLY): 13 min Past Medical History: Past Medical History: Diagnosis Date  Acute kidney failure ([place])   Anemia   Anxiety   Arthritis   Asthma   Blood transfusion without reported diagnosis   Chronic hepatitis C ([place])   Chronic kidney disease, stage 3 ([place])   Cirrhosis ([place])   COPD (chronic obstructive pulmonary disease) ([place])   COVID-19 virus infection   Depression   Diabetes mellitus without complication ([place])   V9D 7.2% 09/17/20  GERD (gastroesophageal reflux disease)   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  I  Hypertension   Liver disease   Normal pressure hydrocephalus (HCC)   Pneumonia   Renal  disorder   Sleep apnea   uses O2 at night  Thrombocytopenia ([place])   Unspecified dementia without behavioral disturbance   early onset  Vaginal Pap smear, abnormal  Past Surgical History: Past Surgical History: Procedure Laterality Date  ESOPHAGEAL BANDING  10/04/2019  Procedure: ESOPHAGEAL BANDING;  Surgeon: Lavena Bullion, DO;  Location: [facility];  Service: Gastroenterology;;  ESOPHAGOGASTRODUODENOSCOPY (EGD) WITH PROPOFOL N/A 10/04/2019  Procedure: ESOPHAGOGASTRODUODENOSCOPY (EGD) WITH PROPOFOL;  Surgeon: Lavena Bullion, DO;  Location: [facility];  Service: Gastroenterology;  Laterality: N/A;  HOT HEMOSTASIS N/A 10/04/2019  Procedure: HOT HEMOSTASIS (ARGON PLASMA COAGULATION/BICAP);  Surgeon: Lavena Bullion, DO;  [facility] END[facility]COPY;  Service: Gastroenterology;  Laterality: N/A;  PORT-A-CATH REMOVAL    SKIN GRAFT    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/2020  Procedure: REPAIR OF TRACHEOCUTANEOIS FISTULA;  Surgeon: Izora Gala, MD;  Location: [facility] OR;  Service: ENT;  Laterality: N/A; HPI: Patient is a 62 y.o. female with PMH: COPD, GERD, hepatitis C, cirrhosis, anxiety, depression, HTN, chronic thrombocytopenia/pancytopenia, unspecified dementia, laryngeal CA s/p radiation and tracheostomy, complicated by tracheocutaneous fistula; CKD 4, , NPH, OSA. She presented to the ED on 01/10/22 from SNF for aggressive behavior and facility reporting that when her ammonia level is elevated this typically happens. She has been combative, aggressive, non-complaint with most interventions in ED.  Subjective: opens eyes slightly and briefly, not cooperative  Recommendations for follow up therapy are one component of a multi-disciplinary discharge planning process, led by the attending physician.  Recommendations may be updated based on patient status, additional functional criteria and insurance authorization. Assessment / Plan / Recommendation   01/13/2022  10:22 AM Clinical Impressions  Clinical  Impression Pt exhibts mild oral and a moderate-severe pharyngeal phase swallow in setting of remote laryngeal cancer and radiation. Pt has a tracheal fistula from history of tracheostomy tube. She has reduced tongue base propulsion and strength resulting in delayed transit and mild residue. Majority of pharyngeal phase ROM and strength are significantly reduced including tongue base retraction, pharyngeal stripping wave, laryngeal elevation and closure. There was silent aspiration of nectar and thin liquids with laryngeal penetration of honey and puree barium with visible barium spilling from open stoma. Epigottis does not invert leaving mod-max residue with honey and puree in addition to decreased tongue base retraction. A head turn to her right slightly decreased residue with honey but not with puree. It is recommended that pt have alternate means of nutrition however in discussion with MD, pt/family may be open to pt accepting aspiration risk. MD to discuss options with family re: PO. ST will follow up on decision from family and will follow up accordingly. SLP Visit Diagnosis Dysphagia, oropharyngeal phase (R13.12) Impact on safety and function Severe aspiration risk     01/13/2022  10:22 AM Treatment Recommendations Treatment Recommendations Therapy as outlined in treatment plan below     01/13/2022  10:22 AM Prognosis Prognosis for Safe Diet Advancement Fair   01/13/2022  10:22 AM Diet Recommendations SLP Diet Recommendations NPO Medication Administration Via alternative means     01/13/2022  10:22 AM Other Recommendations Oral Care Recommendations Oral care QID Follow Up Recommendations Skilled nursing-short term rehab (<3 hours/day) Functional Status Assessment Patient has had a recent decline in their functional status and demonstrates the ability to make significant improvements in function in a reasonable and predictable amount of time.   01/13/2022  10:22 AM Frequency and Duration  Speech Therapy Frequency (ACUTE  ONLY) min 2x/week Treatment Duration 2 weeks     01/13/2022  10:22 AM Oral Phase Oral Phase Impaired Oral - Honey Teaspoon Lingual/palatal residue;Reduced posterior propulsion Oral - Nectar Teaspoon Lingual/palatal residue;Reduced posterior propulsion Oral - Thin Teaspoon Lingual/palatal residue;Reduced posterior propulsion Oral - Puree Lingual/palatal residue;Reduced posterior propulsion    01/13/2022  10:22 AM Pharyngeal Phase Pharyngeal Phase Impaired Pharyngeal- Nectar Teaspoon Reduced epiglottic inversion;Reduced pharyngeal peristalsis;Reduced anterior laryngeal mobility;Reduced laryngeal elevation;Reduced airway/laryngeal closure;Reduced tongue base retraction;Penetration/Aspiration during swallow;Pharyngeal residue - valleculae Pharyngeal- Thin Teaspoon Reduced pharyngeal peristalsis;Reduced epiglottic inversion;Reduced anterior laryngeal mobility;Reduced laryngeal elevation;Reduced airway/laryngeal closure;Reduced tongue base retraction;Penetration/Aspiration during swallow;Pharyngeal residue - valleculae Pharyngeal Material enters airway, passes BELOW cords without attempt by patient to eject out (silent aspiration) Pharyngeal- Puree Pharyngeal residue - valleculae;Penetration/Aspiration during swallow;Reduced tongue base retraction;Reduced airway/laryngeal closure;Reduced laryngeal elevation;Reduced anterior laryngeal mobility;Reduced epiglottic inversion;Reduced pharyngeal peristalsis Pharyngeal Material enters airway, remains ABOVE vocal cords and not ejected out    01/13/2022  10:22 AM Cervical Esophageal Phase  Cervical Esophageal Phase Impaired [place] Siren 01/13/2022, 10:56 AM                     CT HEAD WO CONTRAST (5MM)  Result Date: 01/10/2022 CLINICAL DATA:  Altered mental status. EXAM: CT HEAD WITHOUT CONTRAST TECHNIQUE: Contiguous axial images were obtained from the base of the skull through the vertex without intravenous contrast. RADIATION DOSE REDUCTION: This exam was performed  according to the departmental dose-optimization program which includes automated exposure control, adjustment of the mA and/or kV according to patient size and/or use of iterative reconstruction technique. COMPARISON:  November 11, 2019 FINDINGS: Brain: There is no evidence of acute infarction, hemorrhage, hydrocephalus, extra-axial collection or mass lesion/mass  effect. There are areas of decreased attenuation within the white matter tracts of the supratentorial brain, consistent with microvascular disease changes. Vascular: No hyperdense vessel or unexpected calcification. Skull: Normal. Negative for fracture or focal lesion. Sinuses/Orbits: No acute finding. Other: None. IMPRESSION: 1. No acute intracranial abnormality. 2. 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 Electronically Signed   By: Virgina Norfolk M.D.   On: 01/10/2022 02:39   DG Chest Port 1 View  Result Date: 01/09/2022 CLINICAL DATA:  Altered mental status. EXAM: PORTABLE CHEST 1 VIEW COMPARISON:  October 28, 2020 FINDINGS: The heart size and mediastinal contours are within normal limits. There is tortuosity of the descending thoracic aorta. Mild atelectasis is seen within the left lung base. There is no evidence of a pleural effusion or pneumothorax. The visualized skeletal structures are unremarkable. IMPRESSION: Mild left basilar atelectasis. Electronically Signed   By: Virgina Norfolk M.D.   On: 01/09/2022 22:48    Microbiology: Recent Results (from the past 240 hour(s))  Blood Culture (routine x 2)     Status: None   Collection Time: 02/08/2022  5:41 AM   Specimen: BLOOD RIGHT ARM  Result Value Ref Range Status   Specimen Description BLOOD RIGHT ARM  Final   Special Requests   Final    BOTTLES DRAWN AEROBIC AND ANAEROBIC Blood Culture adequate volume   Culture   Final    NO GROWTH 5 DAYS Performed at [facility], [address], [place], [place] [address]    Report Status 27-Feb-2022 FINAL   Final  Urine Culture     Status: Abnormal   Collection Time: 01/17/2022  5:41 AM   Specimen: In/Out Cath Urine  Result Value Ref Range Status   Specimen Description IN/OUT CATH URINE  Final   Special Requests NONE  Final   Culture (A)  Final    >=100,000 COLONIES/mL AEROCOCCUS SPECIES Standardized susceptibility testing for this organism is not available. Performed at [facility], [place] [address] [place], [place] [address]    Report Status 01/28/2022 FINAL  Final  Resp panel by RT-PCR (RSV, Flu A&B, Covid) Anterior Nasal Swab     Status: Abnormal   Collection Time: 02/05/2022  8:07 AM   Specimen: Anterior Nasal Swab  Result Value Ref Range Status   SARS Coronavirus 2 by RT PCR NEGATIVE NEGATIVE Final    Comment: (NOTE) SARS-CoV-2 target nucleic acids are NOT DETECTED.  The SARS-CoV-2 RNA is generally detectable in upper respiratory specimens during the acute phase of infection. The lowest concentration of SARS-CoV-2 viral copies this assay can detect is 138 copies/mL. A negative result does not preclude SARS-Cov-2 infection and should not be used as the sole basis for treatment or other patient management decisions. A negative result may occur with  improper specimen collection/handling, submission of specimen other than nasopharyngeal swab, presence of viral mutation(s) within the areas targeted by this assay, and inadequate number of viral copies(<138 copies/mL). A negative result must be combined with clinical observations, patient history, and epidemiological information. The expected result is Negative.  Fact Sheet for Patients:  EntrepreneurPulse.com.au  Fact Sheet for Healthcare Providers:  IncredibleEmployment.be  This test is no t yet approved or cleared by the [place] FDA and  has been authorized for detection and/or diagnosis of SARS-CoV-2 by FDA under an Emergency Use Authorization (EUA). This EUA will remain  in  effect (meaning this test can be used) for the duration of the COVID-19 declaration under Section 564(b)(1)  of the Act, 21 U.S.C.section 360bbb-3(b)(1), unless the authorization is terminated  or revoked sooner.       Influenza A by PCR NEGATIVE NEGATIVE Final   Influenza B by PCR NEGATIVE NEGATIVE Final    Comment: (NOTE) The Xpert Xpress SARS-CoV-2/FLU/RSV plus assay is intended as an aid in the diagnosis of influenza from Nasopharyngeal swab specimens and should not be used as a sole basis for treatment. Nasal washings and aspirates are unacceptable for Xpert Xpress SARS-CoV-2/FLU/RSV testing.  Fact Sheet for Patients: EntrepreneurPulse.com.au  Fact Sheet for Healthcare Providers: IncredibleEmployment.be  This test is not yet approved or cleared by the [place] FDA and has been authorized for detection and/or diagnosis of SARS-CoV-2 by FDA under an Emergency Use Authorization (EUA). This EUA will remain in effect (meaning this test can be used) for the duration of the COVID-19 declaration under Section 564(b)(1) of the Act, 21 U.S.C. section 360bbb-3(b)(1), unless the authorization is terminated or revoked.     Resp Syncytial Virus by PCR POSITIVE (A) NEGATIVE Final    Comment: (NOTE) Fact Sheet for Patients: EntrepreneurPulse.com.au  Fact Sheet for Healthcare Providers: IncredibleEmployment.be  This test is not yet approved or cleared by the [place] FDA and has been authorized for detection and/or diagnosis of SARS-CoV-2 by FDA under an Emergency Use Authorization (EUA). This EUA will remain in effect (meaning this test can be used) for the duration of the COVID-19 declaration under Section 564(b)(1) of the Act, 21 U.S.C. section 360bbb-3(b)(1), unless the authorization is terminated or revoked.  Performed at [facility], [place] [address] [place], [place] [address]     Time  spent: 60 minutes  Signed: George Hugh, MD Feb 02, 2022

## 2022-02-12 NOTE — Progress Notes (Signed)
CSW attempted to reach out to [place], N/A TOC wil update as it comes.

## 2022-02-12 NOTE — TOC Progression Note (Signed)
Transition of Care [facility]) - Progression Note    Patient Details  Name: Judy Meza MRN: [id number] Date of Birth: 1960/11/06  Transition of Care [facility]) CM/SW [place], [place] Phone Number: 02/22/2022, 1:22 PM  Clinical Narrative:    CSW has spoken to [place] at hospice, at this time the patient has not been approved and still pending evaluation, Benjamine Mola has stated that they will review this patient tomorrow. At this time they do not have beds but should have a bed available in a few days If the patient is accepted. CSW will follow up with the provider to make them aware of this status.   Expected Discharge Plan: [place]    Expected Discharge Plan and Services                                               Social Determinants of Health (SDOH) Interventions SDOH Screenings   Depression [phone]): Medium Risk (11/25/2021)  Tobacco Use: High Risk (01/30/2022)    Readmission Risk Interventions    10/05/2019    4:04 PM 08/07/2019   10:55 AM 06/06/2019    1:55 PM  Readmission Risk Prevention Plan  Transportation Screening Complete Complete Complete  HRI or Home Care Consult  Not Complete   HRI or Home Care Consult comments  needs SNF   Social Work Consult for [place] Planning/Counseling  Complete   Palliative Care Screening  Not Applicable   Medication Review Press photographer) Complete Complete Complete  PCP or Specialist appointment within 3-5 days of discharge Complete    HRI or Home Care Consult Complete  Complete  SW Recovery Care/Counseling Consult Complete  Complete  Palliative Care Screening Not Applicable  Not [place] Not Applicable  Complete

## 2022-02-12 NOTE — TOC Progression Note (Addendum)
Transition of Care [facility]) - Progression Note    Patient Details  Name: Judy Meza MRN: [id number] Date of Birth: August 31, 1960  Transition of Care [facility] CM/SW Contact  Rodney Booze, [place] Phone Number: 02-17-2022, 2:53 PM  Clinical Narrative:    CSW has spoken to [facility] from hospice at this time they will not accept the patient as the patient is not stable for DC. Patient is reported by palliative care as well provider as actively dying. TOC will continue to follow in case there are any other needs, however at this time there are none.  Expected Discharge Plan: [place]    Expected Discharge Plan and Services                                               Social Determinants of Health (SDOH) Interventions SDOH Screenings   Depression [id number]): Medium Risk (11/25/2021)  Tobacco Use: High Risk (01/26/2022)    Readmission Risk Interventions    10/05/2019    4:04 PM 08/07/2019   10:55 AM 06/06/2019    1:55 PM  Readmission Risk Prevention Plan  Transportation Screening Complete Complete Complete  HRI or Home Care Consult  Not Complete   HRI or Home Care Consult comments  needs SNF   Social Work Consult for [place] Planning/Counseling  Complete   Palliative Care Screening  Not Applicable   Medication Review [employer] photographer) Complete Complete Complete  PCP or Specialist appointment within 3-5 days of discharge Complete    HRI or Home Care Consult Complete  Complete  SW Recovery Care/Counseling Consult Complete  Complete  Palliative Care Screening Not Applicable  Not [place] Not Applicable  Complete

## 2022-02-12 NOTE — Progress Notes (Signed)
PROGRESS NOTE    Judy Meza  MHD:[id number] DOB: 07/18/1960 DOA: 02/02/2022 PCP: Caprice Renshaw, MD  Brief Narrative:  Judy Meza is a 62 y.o. 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  She was also found to have a small subdural hematoma and found to have a partial small bowel obstruction.  She later developed acute hypoxic respiratory failure as well.  Despite maximal medical therapy, patient did not clinically improve.  Her mental status and neurological status remained extremely poor.  Gastroenterology recommended comfort measures.  On 1/20, family decided to transition to comfort care. On Feb 10, 2022, patient is actively dying.  Comfort care measures are in place.  There is no bed availability at hospice facilities today.  Assessment & Plan:   Continue Comfort Care Measures.  Status is: inpatient Dispo: The patient is from: SNF              Anticipated d/c is to: [facility]              Anticipated d/c date is: <24 hours              Patient currently not medically stable for discharge  Subjective: Judy Meza is actively dying.  She is breathing slowly with apneic pauses. Her eyes are rolled back.  We will start PCA continuous pump for comfort and give ativan as needed.  Objective: Vitals:   02/10/22 0600 2022/02/10 0700 02-10-2022 0718 2022-02-10 0800  BP:   (!) 63/49   Pulse: 64 62 63 62  Resp:      Temp:      TempSrc:      SpO2: (!) 80% (!) 84% (!) 85% (!) 84%  Weight:       No intake or  output data in the 24 hours ending 02/10/22 1334  Filed Weights   01/30/22 0500 01/31/22 0500  Weight: 59.5 kg 55.4 kg    Examination:  Physical Exam Constitutional:      Appearance: She is ill-appearing.     Comments: Resting in bed Eyes rolled back Labored breathing Actively dying  HENT:     Head: Normocephalic and atraumatic.     Mouth/Throat:     Mouth: Mucous membranes are dry.  Eyes:     Comments: Drooping eyelids  Cardiovascular:     Rate and Rhythm: Normal rate and regular rhythm.     Pulses: Normal pulses.     Heart sounds: Normal heart sounds.  Abdominal:     Palpations: Abdomen is soft.     Tenderness: There is no abdominal tenderness.  Musculoskeletal:     Cervical back: Neck supple. No rigidity.  Skin:    General: Skin is warm and dry.     Capillary Refill: Capillary refill takes less than 2 seconds.      Data Reviewed: I have personally reviewed following labs and imaging studies  CBC: Recent Labs  Lab 01/30/2022 0630 02/04/2022 2205 01/28/22 0508 01/29/22 0513 01/30/22 0553 01/31/22 0552  WBC 17.6*  --  12.5* 12.9* 8.5 5.5  NEUTROABS 14.3*  --   --   --   --   --  HGB 6.6* 8.2* 8.3* 8.3* 7.8* 7.0*  HCT 21.9* 26.0* 27.3* 27.9* 25.7* 23.5*  MCV 87.3  --  87.8 88.9 88.0 90.4  PLT 98*  --  81* 101* 59* 40*    Basic Metabolic Panel: Recent Labs  Lab 01/28/22 0508 01/29/22 0513 01/30/22 0553 01/30/22 1414 01/31/22 0552  NA 146* 151* 155* 156* 157*  K 4.2 4.7 3.5 3.6 3.3*  CL 114* 120* 126* 128* 129*  CO2 20* 18* 17* 16* 17*  GLUCOSE 132* 152* 129* 94 141*  BUN 60* 62* 68* 67* 68*  CREATININE 4.65* 4.15* 4.30* 4.17* 4.27*  CALCIUM 8.9 8.7* 8.8* 8.8* 9.3    GFR: Estimated Creatinine Clearance: 11.2 mL/min (A) (by C-G formula based on SCr of 4.27 mg/dL (H)). Liver Function Tests: Recent Labs  Lab 02/08/2022 0630 01/28/22 0508 01/29/22 0513 01/30/22 0553 01/31/22 0552  AST 63* 57* 70* 51* 40  ALT 35 34 32 31 23  ALKPHOS 98 89 101  101 75  BILITOT 2.4* 2.6* 2.9* 4.1* 3.9*  PROT 7.1 7.0 6.5 6.4* 6.6  ALBUMIN 2.3* 2.4* 2.3* 2.1* 3.2*    No results for input(s): "LIPASE", "AMYLASE" in the last 168 hours. Recent Labs  Lab 01/21/2022 0728 01/29/22 0513 01/30/22 0553 01/31/22 0552  AMMONIA 70* '27 25 31    '$ Coagulation Profile: Recent Labs  Lab 01/23/2022 0630  INR 1.6*     CBG: Recent Labs  Lab 01/30/22 1233 01/30/22 1659 01/30/22 2016 01/31/22 0023 01/31/22 0550  GLUCAP 96 97 103* 109* 122*    Sepsis Labs: Recent Labs  Lab 01/23/2022 0630 01/15/2022 0643 01/15/2022 0713 01/28/22 0508  PROCALCITON 6.49  --   --   --   LATICACIDVEN  --  3.2* 3.5* 2.9*     Recent Results (from the past 240 hour(s))  Blood Culture (routine x 2)     Status: None   Collection Time: 01/15/2022  5:41 AM   Specimen: BLOOD RIGHT ARM  Result Value Ref Range Status   Specimen Description BLOOD RIGHT ARM  Final   Special Requests   Final    BOTTLES DRAWN AEROBIC AND ANAEROBIC Blood Culture adequate volume   Culture   Final    NO GROWTH 5 DAYS Performed at [facility], [address] [place], [place] [address]    Report Status 02-15-22 FINAL  Final  Urine Culture     Status: Abnormal   Collection Time: 02/08/2022  5:41 AM   Specimen: In/Out Cath Urine  Result Value Ref Range Status   Specimen Description IN/OUT CATH URINE  Final   Special Requests NONE  Final   Culture (A)  Final    >=100,000 COLONIES/mL AEROCOCCUS SPECIES Standardized susceptibility testing for this organism is not available. Performed at [facility], [place] [address], [place], [place] [address]    Report Status 01/28/2022 FINAL  Final  Resp panel by RT-PCR (RSV, Flu A&B, Covid) Anterior Nasal Swab     Status: Abnormal   Collection Time: 01/21/2022  8:07 AM   Specimen: Anterior Nasal Swab  Result Value Ref Range Status   SARS Coronavirus 2 by RT PCR NEGATIVE NEGATIVE Final    Comment: (NOTE) SARS-CoV-2 target nucleic acids are NOT  DETECTED.  The SARS-CoV-2 RNA is generally detectable in upper respiratory specimens during the acute phase of infection. The lowest concentration of SARS-CoV-2 viral copies this assay can detect is 138 copies/mL. A negative result does not preclude SARS-Cov-2 infection and should not be  used as the sole basis for treatment or other patient management decisions. A negative result may occur with  improper specimen collection/handling, submission of specimen other than nasopharyngeal swab, presence of viral mutation(s) within the areas targeted by this assay, and inadequate number of viral copies(<138 copies/mL). A negative result must be combined with clinical observations, patient history, and epidemiological information. The expected result is Negative.  Fact Sheet for Patients:  EntrepreneurPulse.com.au  Fact Sheet for Healthcare Providers:  IncredibleEmployment.be  This test is no t yet approved or cleared by the [place] FDA and  has been authorized for detection and/or diagnosis of SARS-CoV-2 by FDA under an Emergency Use Authorization (EUA). This EUA will remain  in effect (meaning this test can be used) for the duration of the COVID-19 declaration under Section 564(b)(1) of the Act, 21 U.S.C.section 360bbb-3(b)(1), unless the authorization is terminated  or revoked sooner.       Influenza A by PCR NEGATIVE NEGATIVE Final   Influenza B by PCR NEGATIVE NEGATIVE Final    Comment: (NOTE) The Xpert Xpress SARS-CoV-2/FLU/RSV plus assay is intended as an aid in the diagnosis of influenza from Nasopharyngeal swab specimens and should not be used as a sole basis for treatment. Nasal washings and aspirates are unacceptable for Xpert Xpress SARS-CoV-2/FLU/RSV testing.  Fact Sheet for Patients: EntrepreneurPulse.com.au  Fact Sheet for Healthcare Providers: IncredibleEmployment.be  This test is not yet  approved or cleared by the [place] FDA and has been authorized for detection and/or diagnosis of SARS-CoV-2 by FDA under an Emergency Use Authorization (EUA). This EUA will remain in effect (meaning this test can be used) for the duration of the COVID-19 declaration under Section 564(b)(1) of the Act, 21 U.S.C. section 360bbb-3(b)(1), unless the authorization is terminated or revoked.     Resp Syncytial Virus by PCR POSITIVE (A) NEGATIVE Final    Comment: (NOTE) Fact Sheet for Patients: EntrepreneurPulse.com.au  Fact Sheet for Healthcare Providers: IncredibleEmployment.be  This test is not yet approved or cleared by the [place] FDA and has been authorized for detection and/or diagnosis of SARS-CoV-2 by FDA under an Emergency Use Authorization (EUA). This EUA will remain in effect (meaning this test can be used) for the duration of the COVID-19 declaration under Section 564(b)(1) of the Act, 21 U.S.C. section 360bbb-3(b)(1), unless the authorization is terminated or revoked.  Performed at [facility], [place] [address] [place], [place] [address]     Radiology Studies: DG Abd 1 View  Result Date: 01/31/2022 CLINICAL DATA:  Evaluate for ileus versus partial small bowel obstruction. EXAM: ABDOMEN - 1 VIEW COMPARISON:  01/30/2022 FINDINGS: Nasogastric tube unchanged with tip and side-port over the stomach in the left abdomen. Air is present over the transverse colon. Resolution of previously seen air-filled dilated small bowel loops in the left abdomen. There are no current dilated small bowel loops. No evidence of air-fluid levels or free peritoneal air. Remainder of the exam is unchanged. IMPRESSION: Resolution of previously seen air-filled dilated small bowel loops in the left abdomen. Nasogastric tube unchanged with tip and side-port over the stomach in the left abdomen. Electronically Signed   By: Marin Olp M.D.   On: 01/31/2022  09:14   CT HEAD WO CONTRAST (5MM)  Result Date: 01/30/2022 CLINICAL DATA:  Provided history: Subdural hematoma. Evaluate for progression of left subdural hematoma. EXAM: CT HEAD WITHOUT CONTRAST TECHNIQUE: Contiguous axial images were obtained from the base of the skull through the vertex without intravenous contrast. RADIATION DOSE  REDUCTION: This exam was performed according to the departmental dose-optimization program which includes automated exposure control, adjustment of the mA and/or kV according to patient size and/or use of iterative reconstruction technique. COMPARISON:  Head CT 01/29/2022. FINDINGS: Brain: Acute subdural hematoma overlying left cerebral hemisphere (measuring up to 6 mm in thickness), unchanged in size from the prior head CT of 01/29/2022 (remeasured on prior). As before, the majority of the subdural hematoma is present along the left cerebral convexity, but a small amount of hemorrhage is also present along the posterior-inferior falx and left tentorium. Unchanged mass effect upon the underlying left cerebral hemisphere without ventricular effacement or midline shift. Mild patchy and ill-defined hypoattenuation within the cerebral white matter, nonspecific but compatible with chronic small vessel image disease. 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 No demarcated cortical infarct. No evidence of an intracranial mass. Vascular: No hyperdense vessel.  Atherosclerotic calcifications. Skull: No fracture or aggressive osseous lesion. Sinuses/Orbits: No mass or acute finding within the imaged orbits. Minimal mucosal thickening within a posterior left ethmoid air cell. IMPRESSION: Acute subdural hematoma overlying the left cerebral hemisphere, unchanged in size from yesterday's head CT. Electronically Signed   By: Kellie Simmering D.O.   On: 01/30/2022 16:30   DG Abd 1 View  Result Date: 01/30/2022 CLINICAL DATA:  Nausea and vomiting, NG tube. EXAM: ABDOMEN - 1  VIEW COMPARISON:  Chest x-ray 01/28/2022 FINDINGS: Enteric tube tip is at the level of the body of the stomach. There are dilated air-filled small bowel loops in the mid abdomen and left upper quadrant measuring up to 3.5 cm. Air seen throughout nondilated colon to the level the rectum. There are vascular calcifications in the soft tissues. No acute fractures are seen. There are some patchy airspace opacities in the right lung base, new from prior. IMPRESSION: 1. Enteric tube tip is at the level of the body of the stomach. 2. Dilated air-filled small bowel loops in the mid abdomen and left upper quadrant. Findings may represent ileus or partial small bowel obstruction. Electronically Signed   By: Ronney Asters M.D.   On: 01/30/2022 15:37    Scheduled Meds:  glycopyrrolate  0.4 mg Intravenous Q4H   ketotifen  1 drop Both Eyes Daily   latanoprost  1 drop Both Eyes QHS   lidocaine  1 patch Transdermal Daily   pantoprazole (PROTONIX) IV  40 mg Intravenous Q12H   Continuous Infusions:  famotidine (PEPCID) IV 20 mg (01/31/22 0010)   HYDROmorphone       LOS: 5 days   Time spent: 60 min  George Hugh, MD [facility]  If 7PM-7AM, please contact night-coverage [email or web address]  02/22/2022, 1:34 PM

## 2022-02-12 NOTE — Progress Notes (Signed)
30 mL dilaudid IV wasted with Carrie Mew, RN

## 2022-02-12 NOTE — Progress Notes (Deleted)
Pt on comfort care on room air.

## 2022-02-12 DEATH — deceased

## 2022-04-01 ENCOUNTER — Other Ambulatory Visit: Payer: Medicaid Other

## 2022-09-08 ENCOUNTER — Ambulatory Visit: Payer: Medicaid Other | Admitting: Internal Medicine
